# Patient Record
Sex: Female | Born: 1935 | Race: White | Hispanic: No | Smoking: Never smoker
Health system: Southern US, Community
[De-identification: ages and names within clinical notes are randomized; demographics above are authoritative.]

## PROBLEM LIST (undated history)

## (undated) DIAGNOSIS — I70219 Atherosclerosis of native arteries of extremities with intermittent claudication, unspecified extremity: Secondary | ICD-10-CM

## (undated) DIAGNOSIS — K559 Vascular disorder of intestine, unspecified: Secondary | ICD-10-CM

## (undated) DIAGNOSIS — I2510 Atherosclerotic heart disease of native coronary artery without angina pectoris: Secondary | ICD-10-CM

## (undated) DIAGNOSIS — I739 Peripheral vascular disease, unspecified: Secondary | ICD-10-CM

## (undated) DIAGNOSIS — K589 Irritable bowel syndrome without diarrhea: Secondary | ICD-10-CM

## (undated) DIAGNOSIS — D509 Iron deficiency anemia, unspecified: Secondary | ICD-10-CM

## (undated) DIAGNOSIS — E785 Hyperlipidemia, unspecified: Secondary | ICD-10-CM

## (undated) DIAGNOSIS — Q249 Congenital malformation of heart, unspecified: Secondary | ICD-10-CM

## (undated) DIAGNOSIS — K760 Fatty (change of) liver, not elsewhere classified: Secondary | ICD-10-CM

## (undated) DIAGNOSIS — K222 Esophageal obstruction: Secondary | ICD-10-CM

## (undated) DIAGNOSIS — C189 Malignant neoplasm of colon, unspecified: Secondary | ICD-10-CM

## (undated) DIAGNOSIS — K5190 Ulcerative colitis, unspecified, without complications: Secondary | ICD-10-CM

## (undated) DIAGNOSIS — M1990 Unspecified osteoarthritis, unspecified site: Secondary | ICD-10-CM

## (undated) DIAGNOSIS — G629 Polyneuropathy, unspecified: Secondary | ICD-10-CM

## (undated) DIAGNOSIS — R9439 Abnormal result of other cardiovascular function study: Secondary | ICD-10-CM

## (undated) DIAGNOSIS — K449 Diaphragmatic hernia without obstruction or gangrene: Secondary | ICD-10-CM

## (undated) DIAGNOSIS — I480 Paroxysmal atrial fibrillation: Secondary | ICD-10-CM

## (undated) DIAGNOSIS — I10 Essential (primary) hypertension: Secondary | ICD-10-CM

## (undated) DIAGNOSIS — K219 Gastro-esophageal reflux disease without esophagitis: Secondary | ICD-10-CM

## (undated) DIAGNOSIS — I4891 Unspecified atrial fibrillation: Secondary | ICD-10-CM

## (undated) HISTORY — DX: Irritable bowel syndrome, unspecified: K58.9

## (undated) HISTORY — DX: Iron deficiency anemia, unspecified: D50.9

## (undated) HISTORY — DX: Atherosclerotic heart disease of native coronary artery without angina pectoris: I25.10

## (undated) HISTORY — DX: Esophageal obstruction: K22.2

## (undated) HISTORY — DX: Congenital malformation of heart, unspecified: Q24.9

## (undated) HISTORY — DX: Unspecified osteoarthritis, unspecified site: M19.90

## (undated) HISTORY — DX: Malignant neoplasm of colon, unspecified: C18.9

## (undated) HISTORY — DX: Fatty (change of) liver, not elsewhere classified: K76.0

## (undated) HISTORY — DX: Polyneuropathy, unspecified: G62.9

## (undated) HISTORY — PX: APPENDECTOMY: SHX54

## (undated) HISTORY — DX: Ulcerative colitis, unspecified, without complications: K51.90

## (undated) HISTORY — DX: Hyperlipidemia, unspecified: E78.5

## (undated) HISTORY — DX: Abnormal result of other cardiovascular function study: R94.39

## (undated) HISTORY — PX: COLON SURGERY: SHX602

## (undated) HISTORY — DX: Diaphragmatic hernia without obstruction or gangrene: K44.9

## (undated) HISTORY — DX: Peripheral vascular disease, unspecified: I73.9

## (undated) HISTORY — PX: OTHER SURGICAL HISTORY: SHX169

## (undated) HISTORY — DX: Vascular disorder of intestine, unspecified: K55.9

## (undated) HISTORY — PX: TUBAL LIGATION: SHX77

## (undated) HISTORY — DX: Unspecified atrial fibrillation: I48.91

## (undated) HISTORY — DX: Essential (primary) hypertension: I10

---

## 1990-09-01 HISTORY — PX: DILATION AND CURETTAGE OF UTERUS: SHX78

## 2000-05-20 ENCOUNTER — Encounter: Admission: RE | Admit: 2000-05-20 | Discharge: 2000-05-20 | Payer: Self-pay | Admitting: Family Medicine

## 2000-05-20 ENCOUNTER — Encounter: Payer: Self-pay | Admitting: Family Medicine

## 2001-07-15 ENCOUNTER — Encounter: Admission: RE | Admit: 2001-07-15 | Discharge: 2001-07-15 | Payer: Self-pay | Admitting: Internal Medicine

## 2003-09-02 HISTORY — PX: OTHER SURGICAL HISTORY: SHX169

## 2003-09-28 ENCOUNTER — Other Ambulatory Visit: Admission: RE | Admit: 2003-09-28 | Discharge: 2003-09-28 | Payer: Self-pay | Admitting: Family Medicine

## 2003-10-04 ENCOUNTER — Encounter: Admission: RE | Admit: 2003-10-04 | Discharge: 2003-10-04 | Payer: Self-pay | Admitting: Family Medicine

## 2003-10-12 ENCOUNTER — Encounter: Admission: RE | Admit: 2003-10-12 | Discharge: 2003-10-12 | Payer: Self-pay | Admitting: Family Medicine

## 2003-10-25 ENCOUNTER — Encounter: Admission: RE | Admit: 2003-10-25 | Discharge: 2003-10-25 | Payer: Self-pay | Admitting: Family Medicine

## 2004-01-02 ENCOUNTER — Ambulatory Visit: Admission: RE | Admit: 2004-01-02 | Discharge: 2004-01-02 | Payer: Self-pay | Admitting: Obstetrics and Gynecology

## 2004-01-02 ENCOUNTER — Encounter (INDEPENDENT_AMBULATORY_CARE_PROVIDER_SITE_OTHER): Payer: Self-pay | Admitting: Specialist

## 2004-09-01 HISTORY — PX: ROTATOR CUFF REPAIR: SHX139

## 2004-12-26 ENCOUNTER — Ambulatory Visit: Payer: Self-pay | Admitting: Internal Medicine

## 2004-12-31 ENCOUNTER — Encounter: Admission: RE | Admit: 2004-12-31 | Discharge: 2004-12-31 | Payer: Self-pay | Admitting: Family Medicine

## 2005-01-10 ENCOUNTER — Ambulatory Visit: Payer: Self-pay | Admitting: Internal Medicine

## 2005-01-10 DIAGNOSIS — D126 Benign neoplasm of colon, unspecified: Secondary | ICD-10-CM | POA: Insufficient documentation

## 2005-01-13 ENCOUNTER — Encounter: Admission: RE | Admit: 2005-01-13 | Discharge: 2005-01-13 | Payer: Self-pay | Admitting: Family Medicine

## 2005-02-18 ENCOUNTER — Other Ambulatory Visit: Admission: RE | Admit: 2005-02-18 | Discharge: 2005-02-18 | Payer: Self-pay | Admitting: Obstetrics and Gynecology

## 2005-05-12 ENCOUNTER — Encounter: Admission: RE | Admit: 2005-05-12 | Discharge: 2005-05-12 | Payer: Self-pay | Admitting: Family Medicine

## 2005-07-10 ENCOUNTER — Encounter: Admission: RE | Admit: 2005-07-10 | Discharge: 2005-07-10 | Payer: Self-pay | Admitting: Orthopedic Surgery

## 2005-07-14 ENCOUNTER — Ambulatory Visit (HOSPITAL_COMMUNITY): Admission: RE | Admit: 2005-07-14 | Discharge: 2005-07-14 | Payer: Self-pay | Admitting: Orthopedic Surgery

## 2005-07-14 ENCOUNTER — Ambulatory Visit (HOSPITAL_BASED_OUTPATIENT_CLINIC_OR_DEPARTMENT_OTHER): Admission: RE | Admit: 2005-07-14 | Discharge: 2005-07-14 | Payer: Self-pay | Admitting: Orthopedic Surgery

## 2005-11-06 ENCOUNTER — Encounter: Admission: RE | Admit: 2005-11-06 | Discharge: 2005-11-06 | Payer: Self-pay | Admitting: Family Medicine

## 2006-05-27 ENCOUNTER — Encounter: Admission: RE | Admit: 2006-05-27 | Discharge: 2006-05-27 | Payer: Self-pay | Admitting: Family Medicine

## 2006-06-09 ENCOUNTER — Inpatient Hospital Stay (HOSPITAL_COMMUNITY): Admission: AD | Admit: 2006-06-09 | Discharge: 2006-06-12 | Payer: Self-pay | Admitting: Cardiovascular Disease

## 2006-09-01 HISTORY — PX: WRIST SURGERY: SHX841

## 2006-10-22 ENCOUNTER — Ambulatory Visit: Payer: Self-pay | Admitting: Internal Medicine

## 2006-11-05 ENCOUNTER — Encounter (INDEPENDENT_AMBULATORY_CARE_PROVIDER_SITE_OTHER): Payer: Self-pay | Admitting: Specialist

## 2006-11-05 ENCOUNTER — Ambulatory Visit: Payer: Self-pay | Admitting: Internal Medicine

## 2006-11-05 DIAGNOSIS — C20 Malignant neoplasm of rectum: Secondary | ICD-10-CM

## 2006-11-10 ENCOUNTER — Ambulatory Visit: Payer: Self-pay | Admitting: Internal Medicine

## 2006-11-10 ENCOUNTER — Ambulatory Visit: Payer: Self-pay | Admitting: Cardiology

## 2006-11-10 LAB — CONVERTED CEMR LAB: BUN: 13 mg/dL (ref 6–23)

## 2006-11-13 ENCOUNTER — Encounter: Admission: RE | Admit: 2006-11-13 | Discharge: 2006-11-13 | Payer: Self-pay | Admitting: Family Medicine

## 2006-11-26 ENCOUNTER — Ambulatory Visit (HOSPITAL_COMMUNITY): Admission: RE | Admit: 2006-11-26 | Discharge: 2006-11-26 | Payer: Self-pay | Admitting: Gastroenterology

## 2006-11-26 ENCOUNTER — Encounter: Payer: Self-pay | Admitting: Gastroenterology

## 2006-12-02 ENCOUNTER — Ambulatory Visit: Payer: Self-pay | Admitting: Gastroenterology

## 2006-12-31 HISTORY — PX: OTHER SURGICAL HISTORY: SHX169

## 2007-01-12 ENCOUNTER — Encounter (INDEPENDENT_AMBULATORY_CARE_PROVIDER_SITE_OTHER): Payer: Self-pay | Admitting: *Deleted

## 2007-01-12 ENCOUNTER — Encounter (INDEPENDENT_AMBULATORY_CARE_PROVIDER_SITE_OTHER): Payer: Self-pay | Admitting: Specialist

## 2007-01-12 ENCOUNTER — Inpatient Hospital Stay (HOSPITAL_COMMUNITY): Admission: RE | Admit: 2007-01-12 | Discharge: 2007-01-18 | Payer: Self-pay | Admitting: Surgery

## 2007-01-18 ENCOUNTER — Encounter (INDEPENDENT_AMBULATORY_CARE_PROVIDER_SITE_OTHER): Payer: Self-pay | Admitting: *Deleted

## 2007-01-27 ENCOUNTER — Ambulatory Visit: Payer: Self-pay | Admitting: Hematology & Oncology

## 2007-04-22 ENCOUNTER — Ambulatory Visit: Payer: Self-pay | Admitting: Hematology & Oncology

## 2007-04-23 LAB — CREATININE, SERUM: Creatinine, Ser: 0.97 mg/dL (ref 0.40–1.20)

## 2007-04-23 LAB — BUN: BUN: 18 mg/dL (ref 6–23)

## 2007-04-30 ENCOUNTER — Encounter (INDEPENDENT_AMBULATORY_CARE_PROVIDER_SITE_OTHER): Payer: Self-pay | Admitting: *Deleted

## 2007-04-30 ENCOUNTER — Ambulatory Visit (HOSPITAL_COMMUNITY): Admission: RE | Admit: 2007-04-30 | Discharge: 2007-04-30 | Payer: Self-pay | Admitting: Hematology & Oncology

## 2007-05-03 HISTORY — PX: OTHER SURGICAL HISTORY: SHX169

## 2007-05-13 ENCOUNTER — Encounter (INDEPENDENT_AMBULATORY_CARE_PROVIDER_SITE_OTHER): Payer: Self-pay | Admitting: *Deleted

## 2007-05-13 ENCOUNTER — Inpatient Hospital Stay (HOSPITAL_COMMUNITY): Admission: EM | Admit: 2007-05-13 | Discharge: 2007-05-20 | Payer: Self-pay | Admitting: Emergency Medicine

## 2007-05-17 ENCOUNTER — Encounter (INDEPENDENT_AMBULATORY_CARE_PROVIDER_SITE_OTHER): Payer: Self-pay | Admitting: *Deleted

## 2007-05-18 ENCOUNTER — Encounter: Payer: Self-pay | Admitting: Internal Medicine

## 2007-05-19 ENCOUNTER — Encounter: Payer: Self-pay | Admitting: Gastroenterology

## 2007-05-20 ENCOUNTER — Encounter (INDEPENDENT_AMBULATORY_CARE_PROVIDER_SITE_OTHER): Payer: Self-pay | Admitting: *Deleted

## 2007-05-26 ENCOUNTER — Ambulatory Visit: Payer: Self-pay | Admitting: Gastroenterology

## 2007-06-01 ENCOUNTER — Ambulatory Visit: Payer: Self-pay | Admitting: Internal Medicine

## 2007-06-01 LAB — CONVERTED CEMR LAB
Basophils Absolute: 0.1 10*3/uL (ref 0.0–0.1)
Eosinophils Absolute: 0.2 10*3/uL (ref 0.0–0.6)
HCT: 39.3 % (ref 36.0–46.0)
Hemoglobin: 13.1 g/dL (ref 12.0–15.0)
MCHC: 33.4 g/dL (ref 30.0–36.0)
MCV: 83 fL (ref 78.0–100.0)
Monocytes Absolute: 0.5 10*3/uL (ref 0.2–0.7)
Neutro Abs: 2.4 10*3/uL (ref 1.4–7.7)
Neutrophils Relative %: 49.5 % (ref 43.0–77.0)

## 2007-06-29 ENCOUNTER — Ambulatory Visit: Payer: Self-pay | Admitting: Internal Medicine

## 2007-06-29 ENCOUNTER — Encounter: Payer: Self-pay | Admitting: Internal Medicine

## 2007-06-29 DIAGNOSIS — K559 Vascular disorder of intestine, unspecified: Secondary | ICD-10-CM | POA: Insufficient documentation

## 2007-07-22 ENCOUNTER — Ambulatory Visit: Payer: Self-pay | Admitting: Hematology & Oncology

## 2007-09-17 ENCOUNTER — Ambulatory Visit: Payer: Self-pay | Admitting: Hematology & Oncology

## 2007-09-22 LAB — CBC WITH DIFFERENTIAL/PLATELET
BASO%: 0.5 % (ref 0.0–2.0)
LYMPH%: 34.4 % (ref 14.0–48.0)
MCHC: 34.3 g/dL (ref 32.0–36.0)
MONO#: 0.5 10*3/uL (ref 0.1–0.9)
NEUT#: 3.6 10*3/uL (ref 1.5–6.5)
RBC: 5.03 10*6/uL (ref 3.70–5.32)
RDW: 14.7 % — ABNORMAL HIGH (ref 11.3–14.5)
WBC: 6.8 10*3/uL (ref 3.9–10.0)
lymph#: 2.3 10*3/uL (ref 0.9–3.3)

## 2007-09-22 LAB — COMPREHENSIVE METABOLIC PANEL
ALT: 24 U/L (ref 0–35)
AST: 23 U/L (ref 0–37)
Calcium: 9.6 mg/dL (ref 8.4–10.5)
Chloride: 102 mEq/L (ref 96–112)
Creatinine, Ser: 0.77 mg/dL (ref 0.40–1.20)
Potassium: 3.3 mEq/L — ABNORMAL LOW (ref 3.5–5.3)
Sodium: 140 mEq/L (ref 135–145)
Total Protein: 7.1 g/dL (ref 6.0–8.3)

## 2007-09-22 LAB — CEA: CEA: <0.5 ng/mL (ref 0.0–5.0)

## 2007-09-23 ENCOUNTER — Encounter (INDEPENDENT_AMBULATORY_CARE_PROVIDER_SITE_OTHER): Payer: Self-pay | Admitting: *Deleted

## 2007-09-23 ENCOUNTER — Ambulatory Visit (HOSPITAL_COMMUNITY): Admission: RE | Admit: 2007-09-23 | Discharge: 2007-09-23 | Payer: Self-pay | Admitting: Hematology & Oncology

## 2007-11-08 DIAGNOSIS — I10 Essential (primary) hypertension: Secondary | ICD-10-CM | POA: Insufficient documentation

## 2007-11-08 DIAGNOSIS — I209 Angina pectoris, unspecified: Secondary | ICD-10-CM | POA: Insufficient documentation

## 2007-11-08 DIAGNOSIS — I080 Rheumatic disorders of both mitral and aortic valves: Secondary | ICD-10-CM

## 2007-11-08 DIAGNOSIS — Z862 Personal history of diseases of the blood and blood-forming organs and certain disorders involving the immune mechanism: Secondary | ICD-10-CM | POA: Insufficient documentation

## 2007-11-08 DIAGNOSIS — M810 Age-related osteoporosis without current pathological fracture: Secondary | ICD-10-CM

## 2007-11-08 DIAGNOSIS — Z8679 Personal history of other diseases of the circulatory system: Secondary | ICD-10-CM | POA: Insufficient documentation

## 2007-11-08 DIAGNOSIS — E785 Hyperlipidemia, unspecified: Secondary | ICD-10-CM

## 2007-11-08 DIAGNOSIS — M129 Arthropathy, unspecified: Secondary | ICD-10-CM | POA: Insufficient documentation

## 2007-12-15 ENCOUNTER — Encounter: Admission: RE | Admit: 2007-12-15 | Discharge: 2007-12-15 | Payer: Self-pay | Admitting: Family Medicine

## 2008-03-10 ENCOUNTER — Ambulatory Visit: Payer: Self-pay | Admitting: Hematology & Oncology

## 2008-03-20 ENCOUNTER — Ambulatory Visit: Payer: Self-pay | Admitting: Hematology & Oncology

## 2008-04-18 ENCOUNTER — Ambulatory Visit (HOSPITAL_COMMUNITY): Admission: RE | Admit: 2008-04-18 | Discharge: 2008-04-18 | Payer: Self-pay | Admitting: Hematology & Oncology

## 2008-04-18 ENCOUNTER — Encounter (INDEPENDENT_AMBULATORY_CARE_PROVIDER_SITE_OTHER): Payer: Self-pay | Admitting: *Deleted

## 2008-04-18 LAB — CBC WITH DIFFERENTIAL/PLATELET
Basophils Absolute: 0 10*3/uL (ref 0.0–0.1)
EOS%: 6.4 % (ref 0.0–7.0)
HCT: 42.5 % (ref 34.8–46.6)
HGB: 14.5 g/dL (ref 11.6–15.9)
LYMPH%: 40.6 % (ref 14.0–48.0)
MCH: 29.7 pg (ref 26.0–34.0)
MCHC: 34.1 g/dL (ref 32.0–36.0)
MONO#: 0.4 10*3/uL (ref 0.1–0.9)
NEUT%: 43.9 % (ref 39.6–76.8)
Platelets: 208 10*3/uL (ref 145–400)
lymph#: 1.8 10*3/uL (ref 0.9–3.3)

## 2008-04-18 LAB — CEA: CEA: <0.5 ng/mL (ref 0.0–5.0)

## 2008-04-18 LAB — COMPREHENSIVE METABOLIC PANEL
BUN: 14 mg/dL (ref 6–23)
CO2: 31 mEq/L (ref 19–32)
Calcium: 9.9 mg/dL (ref 8.4–10.5)
Chloride: 104 mEq/L (ref 96–112)
Creatinine, Ser: 0.82 mg/dL (ref 0.40–1.20)
Total Bilirubin: 1 mg/dL (ref 0.3–1.2)

## 2008-04-26 ENCOUNTER — Encounter: Payer: Self-pay | Admitting: Internal Medicine

## 2008-09-28 ENCOUNTER — Ambulatory Visit: Payer: Self-pay | Admitting: Hematology & Oncology

## 2008-10-09 ENCOUNTER — Ambulatory Visit (HOSPITAL_COMMUNITY): Admission: RE | Admit: 2008-10-09 | Discharge: 2008-10-09 | Payer: Self-pay | Admitting: Hematology & Oncology

## 2008-10-09 ENCOUNTER — Encounter (INDEPENDENT_AMBULATORY_CARE_PROVIDER_SITE_OTHER): Payer: Self-pay | Admitting: *Deleted

## 2008-10-09 LAB — CBC WITH DIFFERENTIAL/PLATELET
BASO%: 0.4 % (ref 0.0–2.0)
EOS%: 2.4 % (ref 0.0–7.0)
HCT: 43 % (ref 34.8–46.6)
LYMPH%: 35 % (ref 14.0–48.0)
MCH: 30.5 pg (ref 26.0–34.0)
MCHC: 34.7 g/dL (ref 32.0–36.0)
MCV: 88 fL (ref 81.0–101.0)
MONO%: 9.2 % (ref 0.0–13.0)
NEUT%: 53 % (ref 39.6–76.8)
Platelets: 221 10*3/uL (ref 145–400)
RBC: 4.89 10*6/uL (ref 3.70–5.32)
WBC: 5.5 10*3/uL (ref 3.9–10.0)

## 2008-10-09 LAB — COMPREHENSIVE METABOLIC PANEL
ALT: 23 U/L (ref 0–35)
Alkaline Phosphatase: 59 U/L (ref 39–117)
CO2: 26 mEq/L (ref 19–32)
Creatinine, Ser: 0.89 mg/dL (ref 0.40–1.20)
Sodium: 142 mEq/L (ref 135–145)
Total Bilirubin: 1 mg/dL (ref 0.3–1.2)
Total Protein: 7.1 g/dL (ref 6.0–8.3)

## 2008-10-09 LAB — CEA: CEA: <0.5 ng/mL (ref 0.0–5.0)

## 2008-10-10 ENCOUNTER — Ambulatory Visit: Payer: Self-pay | Admitting: Hematology & Oncology

## 2008-10-11 ENCOUNTER — Encounter: Payer: Self-pay | Admitting: Internal Medicine

## 2008-11-14 ENCOUNTER — Ambulatory Visit: Payer: Self-pay | Admitting: Internal Medicine

## 2008-12-11 ENCOUNTER — Telehealth: Payer: Self-pay | Admitting: Internal Medicine

## 2008-12-14 ENCOUNTER — Encounter: Payer: Self-pay | Admitting: Internal Medicine

## 2008-12-14 ENCOUNTER — Ambulatory Visit: Payer: Self-pay | Admitting: Internal Medicine

## 2008-12-15 ENCOUNTER — Telehealth (INDEPENDENT_AMBULATORY_CARE_PROVIDER_SITE_OTHER): Payer: Self-pay | Admitting: *Deleted

## 2008-12-16 ENCOUNTER — Encounter: Payer: Self-pay | Admitting: Internal Medicine

## 2008-12-18 ENCOUNTER — Telehealth: Payer: Self-pay | Admitting: Internal Medicine

## 2008-12-19 ENCOUNTER — Encounter: Payer: Self-pay | Admitting: Internal Medicine

## 2008-12-26 ENCOUNTER — Telehealth: Payer: Self-pay | Admitting: Internal Medicine

## 2009-01-09 ENCOUNTER — Ambulatory Visit: Payer: Self-pay | Admitting: Internal Medicine

## 2009-01-09 DIAGNOSIS — K5010 Crohn's disease of large intestine without complications: Secondary | ICD-10-CM | POA: Insufficient documentation

## 2009-01-11 ENCOUNTER — Encounter: Admission: RE | Admit: 2009-01-11 | Discharge: 2009-01-11 | Payer: Self-pay | Admitting: Obstetrics and Gynecology

## 2009-01-16 ENCOUNTER — Encounter: Admission: RE | Admit: 2009-01-16 | Discharge: 2009-01-16 | Payer: Self-pay | Admitting: Obstetrics and Gynecology

## 2009-02-19 ENCOUNTER — Telehealth: Payer: Self-pay | Admitting: Internal Medicine

## 2009-03-20 ENCOUNTER — Telehealth: Payer: Self-pay | Admitting: Internal Medicine

## 2009-04-16 ENCOUNTER — Ambulatory Visit: Payer: Self-pay | Admitting: Internal Medicine

## 2009-04-16 DIAGNOSIS — Z85048 Personal history of other malignant neoplasm of rectum, rectosigmoid junction, and anus: Secondary | ICD-10-CM

## 2009-09-25 ENCOUNTER — Ambulatory Visit: Payer: Self-pay | Admitting: Hematology & Oncology

## 2009-09-26 ENCOUNTER — Encounter (INDEPENDENT_AMBULATORY_CARE_PROVIDER_SITE_OTHER): Payer: Self-pay | Admitting: *Deleted

## 2009-09-26 ENCOUNTER — Ambulatory Visit (HOSPITAL_BASED_OUTPATIENT_CLINIC_OR_DEPARTMENT_OTHER): Admission: RE | Admit: 2009-09-26 | Discharge: 2009-09-26 | Payer: Self-pay | Admitting: Hematology & Oncology

## 2009-09-26 ENCOUNTER — Ambulatory Visit: Payer: Self-pay | Admitting: Diagnostic Radiology

## 2009-09-26 LAB — CMP (CANCER CENTER ONLY)
Albumin: 4.1 g/dL (ref 3.3–5.5)
Alkaline Phosphatase: 66 U/L (ref 26–84)
BUN, Bld: 18 mg/dL (ref 7–22)
CO2: 30 mEq/L (ref 18–33)
Glucose, Bld: 103 mg/dL (ref 73–118)
Potassium: 3.9 mEq/L (ref 3.3–4.7)
Total Bilirubin: 0.9 mg/dl (ref 0.20–1.60)

## 2009-09-26 LAB — CBC WITH DIFFERENTIAL (CANCER CENTER ONLY)
BASO#: 0 10*3/uL (ref 0.0–0.2)
Eosinophils Absolute: 0.2 10*3/uL (ref 0.0–0.5)
HGB: 15.1 g/dL (ref 11.6–15.9)
LYMPH%: 39.3 % (ref 14.0–48.0)
MCH: 30.5 pg (ref 26.0–34.0)
MCV: 90 fL (ref 81–101)
MONO%: 7.2 % (ref 0.0–13.0)
RBC: 4.96 10*6/uL (ref 3.70–5.32)

## 2009-09-26 LAB — CEA: CEA: <0.5 ng/mL (ref 0.0–5.0)

## 2009-10-03 ENCOUNTER — Encounter: Payer: Self-pay | Admitting: Internal Medicine

## 2009-12-30 LAB — HM MAMMOGRAPHY: HM Mammogram: NORMAL

## 2010-01-18 ENCOUNTER — Encounter: Admission: RE | Admit: 2010-01-18 | Discharge: 2010-01-18 | Payer: Self-pay | Admitting: Obstetrics and Gynecology

## 2010-04-02 ENCOUNTER — Ambulatory Visit: Payer: Self-pay | Admitting: Hematology & Oncology

## 2010-04-03 ENCOUNTER — Encounter: Payer: Self-pay | Admitting: Internal Medicine

## 2010-04-03 LAB — CBC WITH DIFFERENTIAL (CANCER CENTER ONLY)
BASO%: 1.5 % (ref 0.0–2.0)
EOS%: 3.9 % (ref 0.0–7.0)
HCT: 48.4 % — ABNORMAL HIGH (ref 34.8–46.6)
LYMPH#: 2.1 10*3/uL (ref 0.9–3.3)
MONO#: 0.4 10*3/uL (ref 0.1–0.9)
NEUT#: 3.3 10*3/uL (ref 1.5–6.5)
NEUT%: 53.6 % (ref 39.6–80.0)
RDW: 11.6 % (ref 10.5–14.6)
WBC: 6.1 10*3/uL (ref 3.9–10.0)

## 2010-04-03 LAB — COMPREHENSIVE METABOLIC PANEL
ALT: 19 U/L (ref 0–35)
AST: 20 U/L (ref 0–37)
Albumin: 4.9 g/dL (ref 3.5–5.2)
Alkaline Phosphatase: 62 U/L (ref 39–117)
Calcium: 10.5 mg/dL (ref 8.4–10.5)
Chloride: 104 mEq/L (ref 96–112)
Potassium: 4.1 mEq/L (ref 3.5–5.3)
Sodium: 143 mEq/L (ref 135–145)
Total Protein: 7.6 g/dL (ref 6.0–8.3)

## 2010-04-03 LAB — CEA: CEA: <0.5 ng/mL (ref 0.0–5.0)

## 2010-06-21 ENCOUNTER — Telehealth: Payer: Self-pay | Admitting: Internal Medicine

## 2010-07-08 ENCOUNTER — Ambulatory Visit: Payer: Self-pay | Admitting: Internal Medicine

## 2010-09-01 LAB — HM PAP SMEAR: HM Pap smear: NORMAL

## 2010-09-21 ENCOUNTER — Encounter: Payer: Self-pay | Admitting: Family Medicine

## 2010-09-22 ENCOUNTER — Encounter: Payer: Self-pay | Admitting: Hematology & Oncology

## 2010-09-23 ENCOUNTER — Encounter: Payer: Self-pay | Admitting: Hematology & Oncology

## 2010-09-30 ENCOUNTER — Ambulatory Visit (HOSPITAL_BASED_OUTPATIENT_CLINIC_OR_DEPARTMENT_OTHER): Payer: Medicare Other | Admitting: Hematology & Oncology

## 2010-10-03 NOTE — Assessment & Plan Note (Signed)
Summary: f/u crohns disease and hx of rectal ca/ds   History of Present Illness Visit Type: Follow-up Visit Primary GI MD: Lina Sar MD Primary Provider: n/a Requesting Provider: n/a Chief Complaint: Patient not having any problems recently History of Present Illness:   This is a 77 year old white female with a history of a sigmoid carcinoma resected in May 2008 followed by ischemic colitis and an anastomotic stricture requiring dilatation. She had a hyperplastic polyp. In 2009, a flexible sigmoidoscopy revealed ulcerations in the left colon consistent with Crohn's disease. Her IBD markerswere positive. Her last colonoscopy in April 2010 showed colitis, aphthoid ulcers and neutrophilic infiltrate in the left colon. A recall colonoscopy is due in April 2013. Her last office visit with [place] was in August 2010. She has no complaints today.   GI Review of Systems      Denies abdominal pain, acid reflux, belching, bloating, chest pain, dysphagia with liquids, dysphagia with solids, heartburn, loss of appetite, nausea, vomiting, vomiting blood, weight loss, and  weight gain.        Denies anal fissure, black tarry stools, change in bowel habit, constipation, diarrhea, diverticulosis, fecal incontinence, heme positive stool, hemorrhoids, irritable bowel syndrome, jaundice, light color stool, liver problems, rectal bleeding, and  rectal pain.    Current Medications (verified): 1)  Tekturna Hct 300-25 Mg Tabs (Aliskiren-Hydrochlorothiazide) .... One Tablet By Mouth Once Daily 2)  Toprol Xl 50 Mg Xr24h-Tab (Metoprolol Succinate) .... 1/2 Tablet By Mouth Every Morning and  One Tablet By Mouth At Bedtime 3)  Metoprolol Tartrate 25 Mg Tabs (Metoprolol Tartrate) .... One Tablet By Mouth As Needed 4)  Fiorinal 50-325-40 Mg Caps (Butalbital-Aspirin-Caffeine) .... One Tablet By Mouth As Needed For Headaches 5)  Calcium 1200 1200-1000 Mg-Unit Chew (Calcium Carbonate-Vit D-Min) .... One Tablet By Mouth Once  Daily 6)  Multivitamins  Tabs (Multiple Vitamin) .... Once Daily  Allergies (verified): 1)  ! Cardizem 2)  ! Diovan 3)  ! * Avalide 4)  ! * Verpamil 5)  ! Coumadin 6)  ! Norvasc 7)  ! * Lisinopril 8)  ! Vicodin 9)  ! Darvocet 10)  ! * Tylenol # 3  Past History:  Past Medical History: Reviewed history from 11/08/2007 and no changes required. 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 Sigmoid resection for invasive rectal adenocarcinoma (12/2006) Abdominoperineal resection anastomotic stricture (05/2007) Tubaligation (1980) Appendectomy (8657) D & C (1992) Rotator Cuff Repair (2006) Rt. Salpingo oophorectomy & cyst removal (2005) Mandibular Reconstruction  Family History: Reviewed history from 11/09/2008 and no changes required. Family History of Colon Cancer: Father Family History of Heart Disease: Father  Social History: Reviewed history from 11/14/2008 and no changes required. Widowed Alcohol Use - no Illicit Drug Use - no Patient has never smoked.   Review of Systems  The patient denies allergy/sinus, anemia, anxiety-new, arthritis/joint pain, back pain, blood in urine, breast changes/lumps, change in vision, confusion, cough, coughing up blood, depression-new, fainting, fatigue, fever, headaches-new, hearing problems, heart murmur, heart rhythm changes, itching, menstrual pain, muscle pains/cramps, night sweats, nosebleeds, pregnancy symptoms, shortness of breath, skin rash, sleeping problems, sore throat, swelling of feet/legs,  swollen lymph glands, thirst - excessive , urination - excessive , urination changes/pain, urine leakage, vision  changes, and voice change.         Pertinent positive and negative review of systems were noted in the above HPI. All other ROS was otherwise negative.   Vital Signs:  Patient profile:   77 year old female Height:      66 inches Weight:      191.13 pounds BMI:     30.96 Pulse rate:   80 / minute Pulse rhythm:   regular BP sitting:   122 / 74  (left arm) Cuff size:   regular  Vitals Entered By: June McMurray CMA Duncan Dull) (July 08, 2010 1:34 PM)  Physical Exam  General:  Well developed, well nourished, no acute distress. Neck:  Supple; no masses or thyromegaly. Lungs:  Clear throughout to auscultation. Heart:  Regular rate and rhythm; no murmurs, rubs,  or bruits. Abdomen:  well-healed surgical scar in lower abdomen and a post appendectomy scar. No tenderness. No mass. Normal active bowel sounds. Rectal:  normal rectal sphincter tone, stool is Hemoccult negative. Extremities:  No clubbing, cyanosis, edema or deformities noted. Skin:  Intact without significant lesions or rashes. Psych:  Alert and cooperative. Normal mood and affect.   Impression & Recommendations:  Problem # 1:  PERS HX MAL NEOPLSM RECT RECTOSIGMOID JUNC&ANUS (ICD-V10.06) Patient is asymptomatic. A recall colonoscopy will be due in April 2013.  Problem # 2:  Family Hx of COLON CANCER (ICD-153.9) A recall colonoscopy will be due in April 2013.  Problem # 3:  CROHN'S DISEASE, LARGE INTESTINE (ICD-555.1) Patient has Crohn's disease of the left colon but is currently asymptomatic. She has positive IBD markers but is asymptomatic on no medications. Patient discontinued all medications last year and remains asymptomatic. I would question the diagnosis of Crohn's disease at this time.  Patient Instructions: 1)  Remain off medications for now. 2)  Call if any symptoms arise. 3)  Recall colonoscopy for  followup of colon cancer in April 2013 4)  The medication list was reviewed and reconciled.  All changed / newly prescribed medications were explained.  A complete medication list was provided to the patient / caregiver.

## 2010-10-03 NOTE — Letter (Signed)
[facility]   Imported By: Sherian Rein 04/30/2010 08:49:21  _____________________________________________________________________  External Attachment:    Type:   Image     Comment:   External Document

## 2010-10-03 NOTE — Letter (Signed)
[facility]   Imported By: Lester [place] 10/24/2009 10:39:30  _____________________________________________________________________  External Attachment:    Type:   Image     Comment:   External Document

## 2010-10-03 NOTE — Progress Notes (Signed)
Summary: call back request  Phone Note Call from Patient Call back at Home Phone [phone]   Caller: Patient Call For: Dr. Juanda Chance Reason for Call: Talk to Nurse Summary of Call: pt came into office today for appt, but has been bumped... no record of pt being contacted regarding this... I apologized to pt and she was very nice and understanding... pt said that she is actually feeling very well right now and has been... pt didnt feel it was necessary to resch and said she would be in touch if anything problems arose... otherwise, pt said she would see [place] for her COL next year... told pt that since she was advised by another physician to have this appt, either Dr. Juanda Chance or her nurse would follow up with her and give her a call to fully determine whether she needs to be seen before her COL... pt said ok... (appt with Gunnar Fusi at [facility] today was offered to pt) Initial call taken by: Vallarie Mare,  June 21, 2010 8:26 AM  Follow-up for Phone Call        Called patient and apologized that she was bumped from the schedule without being notified first. She was very understanding. I have rescheduled her to see Dr Juanda Chance for 07/08/10 for routine follow up. Although she is having no problems currently, I told her that it is important that we continue to follow up with her on a routine basis in order to keep her healthy. Patient understands. Follow-up by: Lamona Curl CMA Duncan Dull),  June 21, 2010 12:14 PM

## 2010-10-04 ENCOUNTER — Encounter: Payer: Self-pay | Admitting: Internal Medicine

## 2010-10-04 ENCOUNTER — Encounter (HOSPITAL_BASED_OUTPATIENT_CLINIC_OR_DEPARTMENT_OTHER): Payer: Medicare Other | Admitting: Hematology & Oncology

## 2010-10-04 DIAGNOSIS — C20 Malignant neoplasm of rectum: Secondary | ICD-10-CM

## 2010-10-04 LAB — CEA: CEA: <0.5 ng/mL (ref 0.0–5.0)

## 2010-11-07 NOTE — Letter (Signed)
Summary: [facility]  [facility]   Imported By: Lester [place] 10/31/2010 10:37:18  _____________________________________________________________________  External Attachment:    Type:   Image     Comment:   External Document

## 2010-11-28 ENCOUNTER — Encounter: Payer: Self-pay | Admitting: Internal Medicine

## 2010-11-28 ENCOUNTER — Ambulatory Visit (INDEPENDENT_AMBULATORY_CARE_PROVIDER_SITE_OTHER)
Admission: RE | Admit: 2010-11-28 | Discharge: 2010-11-28 | Disposition: A | Discharge status: Home / Self Care | Payer: Medicare Other | Source: Ambulatory Visit | Attending: Internal Medicine | Admitting: Internal Medicine

## 2010-11-28 ENCOUNTER — Ambulatory Visit (INDEPENDENT_AMBULATORY_CARE_PROVIDER_SITE_OTHER): Payer: Medicare Other | Admitting: Internal Medicine

## 2010-11-28 VITALS — BP 118/72 | HR 64 | Temp 98.6°F | Ht 66.0 in | Wt 197.0 lb

## 2010-11-28 DIAGNOSIS — I4891 Unspecified atrial fibrillation: Secondary | ICD-10-CM

## 2010-11-28 DIAGNOSIS — I10 Essential (primary) hypertension: Secondary | ICD-10-CM | POA: Insufficient documentation

## 2010-11-28 DIAGNOSIS — R05 Cough: Secondary | ICD-10-CM

## 2010-11-28 DIAGNOSIS — J209 Acute bronchitis, unspecified: Secondary | ICD-10-CM

## 2010-11-28 DIAGNOSIS — H6090 Unspecified otitis externa, unspecified ear: Secondary | ICD-10-CM

## 2010-11-28 DIAGNOSIS — H60399 Other infective otitis externa, unspecified ear: Secondary | ICD-10-CM

## 2010-11-28 DIAGNOSIS — R059 Cough, unspecified: Secondary | ICD-10-CM

## 2010-11-28 DIAGNOSIS — Z8679 Personal history of other diseases of the circulatory system: Secondary | ICD-10-CM

## 2010-11-28 MED ORDER — NEOMYCIN-POLYMYXIN-HC 3.5-10000-1 OT SOLN: 3.0000 [drp] | Freq: Three times a day (TID) | OTIC | Status: AC

## 2010-11-28 MED ORDER — PROMETHAZINE-DM 6.25-15 MG/5ML PO SYRP: 5.0000 mL | ORAL_SOLUTION | Freq: Four times a day (QID) | ORAL | Status: AC | PRN

## 2010-11-28 MED ORDER — CEFUROXIME AXETIL 500 MG PO TABS: 500.0000 mg | ORAL_TABLET | Freq: Two times a day (BID) | ORAL | Status: AC

## 2010-11-28 NOTE — Progress Notes (Signed)
Subjective:    Patient ID: [name], female    DOB: 13-Nov-1932, 77 y.o.   MRN: [id number]  Cough This is a new problem. The current episode started 1 to 4 weeks ago. The problem has been waxing and waning. The problem occurs every few hours. The cough is productive of purulent sputum. Associated symptoms include ear congestion and ear pain. Pertinent negatives include no chest pain, chills, fever, headaches, heartburn, hemoptysis, myalgias, nasal congestion, postnasal drip, rash, rhinorrhea, sore throat, shortness of breath, sweats, weight loss or wheezing. The symptoms are aggravated by nothing. She has tried OTC cough suppressant for the symptoms. The treatment provided mild relief. There is no history of asthma, bronchiectasis, bronchitis, emphysema, environmental allergies or pneumonia.      Review of Systems  Constitutional: Negative for fever, chills, weight loss, diaphoresis, activity change, appetite change, fatigue and unexpected weight change.  HENT: Positive for ear pain. Negative for hearing loss, nosebleeds, congestion, sore throat, facial swelling, rhinorrhea, sneezing, trouble swallowing, neck pain, voice change, postnasal drip, sinus pressure, tinnitus and ear discharge.   Eyes: Negative for pain.  Respiratory: Positive for cough. Negative for hemoptysis, choking, shortness of breath, wheezing and stridor.   Cardiovascular: Negative for chest pain, palpitations and leg swelling.  Gastrointestinal: Negative for heartburn, abdominal pain, diarrhea and constipation.  Genitourinary: Negative for dysuria, urgency, hematuria and flank pain.  Musculoskeletal: Negative for myalgias, back pain, joint swelling, arthralgias and gait problem.  Skin: Negative for color change, pallor and rash.  Neurological: Negative for dizziness, tremors, weakness, numbness and headaches.  Hematological: Negative for environmental allergies. Does not bruise/bleed easily.  Psychiatric/Behavioral:  Negative for behavioral problems, confusion, dysphoric mood, decreased concentration and agitation.       Objective:   Physical Exam  Nursing note and vitals reviewed. Constitutional: She is oriented to person, place, and time. She appears well-developed and well-nourished. No distress.  HENT:  Head: Normocephalic and atraumatic.  Right Ear: Hearing, tympanic membrane, external ear and ear canal normal.  Left Ear: Hearing, tympanic membrane and external ear normal. Left ear swelling: left eac has mild scaling but no erythema, swelling or exudate.  Nose: Nose normal.  Mouth/Throat: Oropharynx is clear and moist. No oropharyngeal exudate.  Eyes: Conjunctivae and EOM are normal. Pupils are equal, round, and reactive to light. Right eye exhibits no discharge. Left eye exhibits no discharge. No scleral icterus.  Neck: Normal range of motion. Neck supple. No thyromegaly present.  Cardiovascular: Normal rate, regular rhythm, S1 normal, S2 normal, normal heart sounds and intact distal pulses.  PMI is not displaced.  Exam reveals no gallop and no friction rub.   No murmur heard. Pulmonary/Chest: Effort normal and breath sounds normal. No respiratory distress. She has no wheezes. She has no rales. She exhibits no tenderness.  Abdominal: Soft. Bowel sounds are normal. She exhibits no distension and no mass. There is no tenderness. There is no rebound and no guarding.  Musculoskeletal: Normal range of motion. She exhibits no edema and no tenderness.  Lymphadenopathy:       Head (right side): No submental, no submandibular, no tonsillar, no preauricular, no posterior auricular and no occipital adenopathy present.       Head (left side): No submental, no submandibular, no tonsillar, no preauricular, no posterior auricular and no occipital adenopathy present.       Right cervical: No superficial cervical adenopathy present.      Left cervical: No superficial cervical adenopathy present.    She has no  axillary adenopathy.  Neurological: She is alert and oriented to person, place, and time. She has normal reflexes. No cranial nerve deficit. Coordination normal.  Skin: Skin is warm and dry. No rash noted. She is not diaphoretic. No erythema. No pallor.  Psychiatric: She has a normal mood and affect. Her behavior is normal. Judgment and thought content normal.          Assessment & Plan:

## 2010-11-28 NOTE — Assessment & Plan Note (Signed)
Start cortisporin otic susp 

## 2010-11-28 NOTE — Assessment & Plan Note (Signed)
Will check a cxr for pna, masses, edema

## 2010-11-28 NOTE — Patient Instructions (Signed)
Bronchitis Bronchitis is the body's way of reacting to injury and/or infection (inflammation) of the bronchi. Bronchi are the air tubes that extend from the windpipe into the lungs. If the inflammation becomes severe, it may cause shortness of breath.  CAUSES Inflammation may be caused by:  A virus.   Germs (bacteria).   Dust.   Allergens.   Pollutants and many other irritants.  The cells lining the bronchial tree are covered with tiny hairs (cilia). These constantly beat upward, away from the lungs, toward the mouth. This keeps the lungs free of pollutants. When these cells become too irritated and are unable to do their job, mucus begins to develop. This causes the characteristic cough of bronchitis. The cough clears the lungs when the cilia are unable to do their job. Without either of these protective mechanisms, the mucus would settle in the lungs. Then you would develop pneumonia. Smoking is a common cause of bronchitis and can contribute to pneumonia. Stopping this habit is the single most important thing you can do to help yourself. TREATMENT  Your caregiver may prescribe an antibiotic if the cough is caused by bacteria. Also, medicines that open up your airways make it easier to breathe. Your caregiver may also recommend or prescribe an expectorant. It will loosen the mucus to be coughed up. Only take over-the-counter or prescription medicines for pain, discomfort, or fever as directed by your caregiver.   Removing whatever causes the problem (smoking, for example) is critical to preventing the problem from getting worse.   Cough suppressants may be prescribed for relief of cough symptoms.   Inhaled medicines may be prescribed to help with symptoms now and to help prevent problems from returning.   For those with recurrent (chronic) bronchitis, there may be a need for steroid medicines.  SEEK IMMEDIATE MEDICAL CARE IF:  During treatment, you develop more pus-like mucus  (purulent sputum).   You or your child has an oral temperature above 100.5, not controlled by medicine.   Your baby is older than 3 months with a rectal temperature of 102 F (38.9 C) or higher.   Your baby is 3 months old or younger with a rectal temperature of 100.4 F (38 C) or higher.   You become progressively more ill.   You have increased difficulty breathing, wheezing, or shortness of breath.  It is necessary to seek immediate medical care if you are elderly or sick from any other disease. MAKE SURE YOU:  Understand these instructions.   Will watch your condition.   Will get help right away if you are not doing well or get worse.  Document Released: 08/18/2005 Document Re-Released: 11/12/2009 [employer] Patient Information 2011 [employer]. 

## 2010-11-28 NOTE — Assessment & Plan Note (Signed)
By all accounts she has good rate and rhythm control but she told me that she is not willing to take coumadin or pradaxa despite the high risk for CVA with her hx. Of parox AF

## 2010-11-28 NOTE — Assessment & Plan Note (Signed)
Start ceftin to treat the infection and control the cough with phenergan dm

## 2010-11-28 NOTE — Assessment & Plan Note (Signed)
Her BP is well controlled 

## 2011-01-14 NOTE — H&P (Signed)
[name], [name] NO.:  [id number]   MEDICAL RECORD NO.:  [id number]          PATIENT TYPE:  INP   LOCATION:  0104                         FACILITY:  [facility]   PHYSICIAN:  Elliot Cousin, M.D.    DATE OF BIRTH:  1933/05/13   DATE OF ADMISSION:  05/12/2007  DATE OF DISCHARGE:                              HISTORY & PHYSICAL   PRIMARY CARE PHYSICIAN:  Talmadge Coventry, M.D.   CARDIOLOGIST:  Nicki Guadalajara, M.D.   GASTROENTEROLOGIST:  Hedwig Morton. Juanda Chance, M.D.   GENERAL SURGEON:  Clovis Pu. Cornett, M.D.   CHIEF COMPLAINT:  Abdominal pain and nausea.   HISTORY OF PRESENT ILLNESS:  The patient is a 77 year old woman with a  past medical history significant for rectal cancer, status post low  anterior colon resection in May 2008, hypertension, and hyperlipidemia.  She presents to the emergency department with a chief complaint of  abdominal pain and nausea.  Her pain started 2 days ago.  She had been  having small frequent loose stools for several weeks to several months  following the colon resection.  Three days ago, she took Imodium prior  to going to church.  Since that time, she has felt tremendously  constipated.  She had a mucusy discharge from her rectum yesterday, with  a small pebble of a bowel movement last night.  Prior to the mucusy  rectal stool, she took a Dulcolax laxative which did not seem to help.  The abdominal pain started yesterday afternoon.  It is located over the  lower abdomen.  She describes the pain as a cramping pain, and it has  been moderate to severe in intensity.  She denies associated black tarry  stools, bright red blood per rectum, and painful urination. She has had  nausea but no vomiting up until she drank the contrast in the emergency  department last night.  She had an episode of vomiting with a large  amount of emesis.  She denies bright red blood in the emesis and coffee-  ground emesis.  She had one episode of subjective  fever and chills.   During the evaluation in the emergency department, the patient is noted  to be afebrile and hemodynamically stable.  A CT scan of the abdomen and  pelvis was ordered by the emergency department physician.  The results  of the CT reveal a large amount of stool, colitis/pericolitis at the  left upper quadrant; no abscess, and no perforation.  Her lab data are  significant for a white blood cell count of 14.7, and a serum potassium  of 3.3.  The patient will be admitted for further evaluation and  management.   PAST MEDICAL HISTORY:  1. Rectal adenocarcinoma, status post laparoscopic-assisted low      anterior resection, May 2008, by Dr. Luisa Hart.  The patient did not      undergo chemotherapy or radiation therapy.  2. Hypertension.  3. Hyperlipidemia.  4. History of paroxysmal atrial fibrillation.  5. Status post appendectomy in the past.  6. Status post D&C in the past.  7. Status post right salpingo-oophorectomy  in May 2005.  8. Status post shoulder arthroscopic surgery in July 2006.   MEDICATIONS:  1. Hydrochlorothiazide 25 mg daily.  2. Metoprolol 25 mg in the morning, and 50 mg at bedtime.  3. Tekturna 150 mg daily.  4. Fiorinal as needed.   ALLERGIES:  The patient has allergies and/or intolerances to AVALIDE,  CARDIZEM, COUMADIN, DIOVAN, LISINOPRIL, NORVASC, and VERAPAMIL.   SOCIAL HISTORY:  The patient is widowed.  She lives in [place],  [place].  She has four children.  She is retired.  She denies  tobacco, alcohol, and illicit drug use.   FAMILY HISTORY:  Her mother died of Alzheimer's dementia at 69 years of  age.  Her father died of complications of heart surgery at 75 years of  age.   REVIEW OF SYSTEMS:  The patient's review of systems is positive for  small frequent loose bowel movements which started following the rectal  surgery.  Review of systems is negative for black, tarry stools and  bright red blood per rectum.    PHYSICAL EXAMINATION:  VITAL SIGNS:  Temperature 97.1, blood pressure  137/83, pulse 89, respiratory rate 18, oxygen saturation 97% on room  air.  GENERAL:  The patient is a 77 year old Caucasian woman who is currently  lying in bed, in no acute distress.  She does appear ill.  HEENT:  Head is normocephalic, atraumatic.  Pupils are equal, round, and  reactive to light.  Extraocular movements are intact.  Conjunctivae are  clear.  Sclerae are white.  Nasal mucosa is dry.  No sinus tenderness.  Oropharynx reveals dry mucous membranes.  Teeth are in good repair.  No  posterior exudates or erythema.  NECK:  Supple.  No adenopathy, no thyromegaly, no bruit, no JVD.  LUNGS:  Clear to auscultation bilaterally.  HEART:  S1, S2, with an ectopic beat, and soft systolic murmur.  ABDOMEN:  Obese.  Hypoactive bowel sounds.  Soft.  Mildly tender in the  hypogastrium, without rebound, guarding, or distention.  RECTAL:  Rectal tone is excellent.  Small amount of brown stool in the  rectal vault.  Hemoccult testing pending.  EXTREMITIES:  Pedal pulses palpable bilaterally.  No pretibial edema,  and no pedal edema.  NEUROLOGIC:  The patient is alert and oriented x3.  Cranial nerves II-  XII are intact.  Strength is 5/5 throughout.  Sensation is intact.   ADMISSION LABORATORIES:  EKG results are pending.  WBC 14.7, hemoglobin  15, platelets 274.  Sodium 143, potassium 3.3, chloride 102, CO2 27,  glucose 172, BUN 20, creatinine 1.02, total bilirubin 1.1, alkaline  phosphatase 69, SGOT 31, SGPT 26, total protein 7.6, albumin 4.3,  calcium 10.6.   ASSESSMENT:  1. Abdominal pain with nausea (and vomiting following ingestion of the      oral contrast in the emergency department).  More than likely, the      patient's symptomatology is secondary to constipation and colitis      as seen on the CT scan of the abdomen.  2. Leukocytosis.  The leukocytosis is probably secondary to the      colitis.  The patient  is afebrile, however.  3. Hypokalemia.  The patient's serum potassium is 3.3.  The      hypokalemia may be secondary to hydrochlorothiazide therapy.  4. Mild hyperglycemia.  The patient's venous glucose is 172.  However,      it was not taken fasting.  5. History of rectal adenocarcinoma, status post  low anterior      resection in May 2008.  6. Hypertension.  The patient's blood pressure is stable currently.      She is treated with three antihypertensive medications.   PLAN:  1. The patient will be admitted for further evaluation and management.  2. We will start empiric antibiotic treatment with Flagyl and Cipro      intravenously.  3. We will start a clear liquid diet initially and advance as      tolerated.  4. Laxative therapy with milk of magnesia and MiraLax.  We will try to      evacuate the stool with Dulcolax suppositories and/or soapsuds      enemas.  5. We will replete potassium chloride in the IV fluids.  6. We will decrease the pill burden.  7. We will treat the patient's pain with morphine as needed.      Antiemetic therapy will be given with Phenergan and Reglan.  8. General surgeon Dr. Colin Benton reviewed the patient's CT scan and felt      that there was no surgical indication at this time.  She will be      happy to consult if needed.      Elliot Cousin, M.D.  Electronically Signed     DF/MEDQ  D:  05/13/2007  T:  05/13/2007  Job:  [id number]   cc:   Talmadge Coventry, M.D.  Fax: [phone]   Nicki Guadalajara, M.D.  Fax: [phone]   Hedwig Morton. Juanda Chance, MD  [address]  [place]  [place] [address]   Maisie Fus A. Cornett, M.D.  [address] [place] [address]  [place] [address]

## 2011-01-14 NOTE — Discharge Summary (Signed)
NAMEARYA, [name]               ACCOUNT NO.:  [id number]   MEDICAL RECORD NO.:  [id number]          PATIENT TYPE:  INP   LOCATION:  1331                         FACILITY:  [facility]   PHYSICIAN:  Clovis Pu. Cornett, M.D.DATE OF BIRTH:  1932/09/14   DATE OF ADMISSION:  01/12/2007  DATE OF DISCHARGE:  01/18/2007                               DISCHARGE SUMMARY   ADMITTING DIAGNOSIS:  Rectal cancer.   DISCHARGE DIAGNOSIS:  Rectal cancer.   PROCEDURE PERFORMED:  Laparoscopic-assisted low anterior resection.   HISTORY OF PRESENT ILLNESS:  The patient is a pleasant, 77 year old  female found to have what appeared to be T1 lesion in her proximal  rectum.  She was admitted for laparoscopic-assisted low anterior  resection which was performed on Jan 12, 2007.   HOSPITAL COURSE:  The patient underwent laparoscopic-assisted low  anterior section.  Please see operative note for details.  Her postop  course was complicated by ileus which resolved by postop day #6.  Her  bowel function returned.  Her diet was advanced.  Wound was clean, dry,  intact.  She remained afebrile.  She was ambulated without difficulty  and able to tolerate a diet without nausea and vomiting.  She was  discharged home on postop day #7 in satisfactory condition.   DISPOSITION:  The patient will be discharged home.   DISCHARGE MEDICATIONS:  1. Hydrochlorothiazide 25 mg daily.  2. Simvastatin 20 mg daily.  3. Lopressor 25 mg daily and then Lopressor 50 mg at bedtime.  4. Tecturna 150 mg daily.   DIET:  Her diet will be a soft mechanical diet as tolerated.  She will  ambulate daily and refrain from lifting and driving for least 2-3 weeks.  She will return on Friday to have her staples removed in my office.   CONDITION ON DISCHARGE:  Improved.      Thomas A. Cornett, M.D.  Electronically Signed     TAC/MEDQ  D:  01/18/2007  T:  01/18/2007  Job:  [id number]   cc:   Hedwig Morton. Juanda Chance, MD  [address]  [place]  [place] [address]   Rachael Fee, MD  [address]  [place], [place] [address]

## 2011-01-14 NOTE — Discharge Summary (Signed)
NAMEDONNARAE, RAE               ACCOUNT NO.:  [id number]   MEDICAL RECORD NO.:  [id number]          PATIENT TYPE:  INP   LOCATION:  1428                         FACILITY:  [facility]   PHYSICIAN:  Hillery Aldo, M.D.   DATE OF BIRTH:  [birth year]   DATE OF ADMISSION:  05/12/2007  DATE OF DISCHARGE:  05/20/2007                               DISCHARGE SUMMARY   PRIMARY CARE PHYSICIAN:  Dr. Rise Mu Mazzocchi.   GASTROENTEROLOGIST:  Dr. Juanda Chance.   DISCHARGE DIAGNOSES:  1. Abdominoperineal resection anastomotic stricture.  2. Ischemic colitis.  3. Stercoral ulcer.  4. Diarrhea.  5. History of colon cancer, status post resection.  6. Hypertension.  7. Hypokalemia.  8. Hyperlipidemia.  9. History of paroxysmal atrial fibrillation.   DISCHARGE MEDICATIONS:  1. Ciprofloxacin 500 mg q.12 h.  2. Flagyl 500 mg q.8 h.  3. Aspirin 81 mg daily.  4. Protonix 40 mg daily for 1 week, then p.r.n. for reflux.  5. Metoprolol 25 mg q.a.m., 50 mg q.p.m.  6. Fiorinal one tablet q.4 h. p.r.n.  7. MiraLax 17 g in 8 ounces of water daily.  8. Tekturna 150 mg daily.   NOTE:  The patient was instructed to discontinue hydrochlorothiazide  until she sees her primary care physician in followup.   CONSULTATIONS:  Dr. Christella Hartigan of Gastroenterology.   PROCEDURES AND DIAGNOSTIC STUDIES:  1. CT scan of the abdomen and pelvis on May 13, 2007 showed no      significant finding in the pelvis other than a large amount of      fecal matter in the colon.  There was a development of      colitis/pericolitis in the left upper quadrant.  Nonspecific low      density in the posterior segment of the right lobe of the liver.  2. Repeat CT scan of the abdomen and pelvis on May 17, 2007      showed continued changes consistent with colitis primarily      involving the descending colon with no evidence for bowel      perforation or abscess.  3. Flexible sigmoidoscopy on May 19, 2007 showed  abdominoperineal resection anastomotic stricture (lumen was 7 mm,      but was partially dilated with a scope passage to 9-10 mm).      Findings in left colon consistent with ischemic colitis.  She was      very constipated on CT scan last week.  Perhaps the anastomotic      stricture is causing partial obstruction and the severe      constipation caused colitis in stercoral ulcer-type fashion.      Recommendations were to complete a full 2-week course of      antibiotics, advance her diet, and to start MiraLax to keep the      stool soft.  She may need more formal dilation of the anastomotic      stricture in the future and plans to follow up with Dr. Juanda Chance in 2-      3 weeks.   DISCHARGE LABORATORY VALUES:  Sodium was 140, potassium  3.4 (repleted),  chloride 108, bicarb 26, BUN 3, creatinine 0.72, glucose 94.  White  blood cell count was 4.4, hemoglobin 11.5, hematocrit 34.5, platelets  275,000.   HOSPITAL COURSE:  PROBLEM #1 - DIARRHEA SECONDARY TO COLITIS:  The  patient was admitted and empirically put on Cipro and Flagyl.  Radiographic imaging was done with the findings as noted above.  A GI  consultation was requested and kindly provided by Dr. Christella Hartigan.  The  patient did undergo flexible sigmoidoscopy with findings and  recommendations as noted above.  She will complete a 2-week course of  Flagyl and Cipro.   PROBLEM #2 - HISTORY OF COLON CANCER, STATUS POST RESECTION:  The  patient does have evidence of an anastomotic stricture and will likely  need to follow up Dr. Juanda Chance for consideration of additional dilatation  if needed.   Problem #3 - HYPERTENSION:  The patient's blood pressures were actually  soft on admission and her Tekturna and hydrochlorothiazide were  discontinued.  Her metoprolol dose was decreased to 25 mg b.i.d.  Her  blood pressures have come up and she should resume her Tekturna and her  metoprolol at the usual dose, but I would continue to hold the   hydrochlorothiazide until she follows up with her primary care physician  for a repeat blood pressure check.   PROBLEM #4 - HYPERLIPIDEMIA:  The patient was maintained on a low-fat  diet.   PROBLEM #5 - PAROXYSMAL ATRIAL FIBRILLATION:  The patient had 1 episode  of tachycardia, but overall her rates have been in the 60s.  Her  telemetry rhythm has mostly been normal sinus.   PROBLEM #6 - HYPOKALEMIA:  The patient was repleted prior to discharge.   DISPOSITION:  The patient is medically stable for discharge and wishes  to return home.   FOLLOWUP:   SHE:  Should follow up with Dr. Juanda Chance in 2-3 weeks and with her primary  care physician in 1-2 weeks.      Hillery Aldo, M.D.  Electronically Signed     CR/MEDQ  D:  05/20/2007  T:  05/20/2007  Job:  [id number]   cc:   Talmadge Coventry, M.D.  Fax: [phone]   Hedwig Morton. Juanda Chance, MD  [address]  [place]  [place] [address]

## 2011-01-14 NOTE — Assessment & Plan Note (Signed)
[facility]                         GASTROENTEROLOGY OFFICE NOTE   NAME:BUNTONJodee, Turner                      MRN:          [id number]  DATE:06/01/2007                            DOB:          December 28, 1932    [name] is a 77 year old white female with a history of rectal cancer  diagnosed in March 2008 status post abdominal perineal resection by Dr.  Luisa Hart in May 2008, which was laparoscopically assisted.  She was  recently hospitalized at [facility] between September 10 and  May 20, 2007 with severe ischemic colitis at the sigmoid  anastomosis, which started to stricture down.  She presented with fecal  impaction.  The patient was discharged on Cipro 500 mg b.i.d. and Flagyl  500 mg q.8h.  She has been off the antibiotics for 3 days.  She also  takes MiraLax 17 g daily, which causes her to have up to 10 liquidy  bowel movements a day.  Her rectum is quite sore.  She denies any fever,  but her general level of energy has been rather low, and she has not  been feeling well.  She was advised to stay on a bland diet, which to  her translates to eating only starchy foods.  She has not had any  vegetables, fruit, or any variety of diet.   MEDICATIONS:  1. MiraLax 17 g daily.  2. Tekturna 150 mg p.o. daily.  3. Toprol 75 mg p.o. daily.   PHYSICAL EXAM:  Blood pressure 136/68, pulse 80, and weight 187 pounds.  Her initial weight after surgery was 196 pounds.  She was alert and oriented, somewhat depressed-appearing.  LUNGS:  Clear to auscultation.  COR:  Normal S1, normal S2.  ABDOMEN:  Soft with well-healed vertical scar below the umbilicus.  Hyperactive bowel sounds.  Mild tenderness on deep pressure to left  lower and left middle quadrants.  No rebound.  No distension.  RECTAL:  Exam reveals somewhat tender and sensitive anal canal.  Stool  was soft, Hemoccult negative.   IMPRESSION:  44. A 77 year old white female status post abdominal  perineal resection      for rectal carcinoma with development of ischemic colon stricture      and ischemic seminal colitis, as per flexible sigmoidoscopy by Dr.      Christella Hartigan on May 19, 2007.  2. Constipation secondary to #1.  The patient is now responding to      MiraLax, but is having too much diarrhea.  3. Iron deficiency anemia.   PLAN:  1. Decrease MiraLax to 9 g every other day.  Goal would be to titrate      her bowel movements to about 2 soft bowel movements daily.  2. Stop antibiotics and repeat CBC today.  3. Analpram cream 2.5% samples given for rectal irritation.  4. Iron pills.  Samples of Tandem Plus given to take daily.  5. Flexible sigmoidoscopy scheduled for first week in November, prior      to her appointment with Dr. Luisa Hart, which has been scheduled for      July 08, 2007.  Today,  we are checking her CBC, iron studies, as      well as sedimentation rate.     Hedwig Morton. Juanda Chance, MD  Electronically Signed    DMB/MedQ  DD: 06/01/2007  DT: 06/01/2007  Job #: [id number]   cc:   Talmadge Coventry, M.D.  Thomas A. Cornett, M.D.

## 2011-01-14 NOTE — Discharge Summary (Signed)
[name], [name]               ACCOUNT NO.:  [id number]   MEDICAL RECORD NO.:  [id number]          PATIENT TYPE:  INP   LOCATION:  1428                         FACILITY:  [facility]   PHYSICIAN:  Herbie Saxon, MDDATE OF BIRTH:  1933/06/11   DATE OF ADMISSION:  05/12/2007  DATE OF DISCHARGE:                               DISCHARGE SUMMARY   INTERIM DISCHARGE SUMMARY.   DATE OF DISCHARGE:  To be determined.   GASTROENTEROLOGIST:  Dr. Lina Sar.   RADIOLOGY:  The CT abdomen and pelvis on admission shows development of  colitis in left upper quadrant with a large amount of fecal matter.  There is a nonspecific low density in the posterior segment of the right  lobe of the liver.  The repeat CT abdomen on May 17, 2007, shows a  small hiatal hernia, no evidence of bowel perforation or bowel  obstruction.  He has abnormal wall thickening and pericolonic fat  stranding involving the descending colon to the level of the sigmoid  colon which is consistent with continued changes of colitis with no  evidence of abscess formation.  The liver morphology is normal.   HOSPITAL COURSE:  This 74-year Caucasian lady with a past medical  history of rectal cancer status post colon resection May, 2008, also  with a history of hypertension and hyperlipidemia, presented with  abdominal pain, nausea.  Also note the patient has a primary history of  paroxysmal atrial fibrillation.  She continued having leukocytosis which  was probably secondary to the colitis and she had hypokalemia of 3.3  which was supplemented.  Her HCTZ therapy was held.  Initial Hemoccult  was positive, however.  Patient's hematocrit has stayed stable with no  active rectal bleed.  Diarrhea is still persisting, although patient  symptomatically feels a little better.  She is complaining of a jittery  feeling, anorexia with opiate narcotics and these are being held.  She  had an episode of tachycardia on May 17, 2007.  Although this was  asymptomatic, this probably could be due to a history of paroxysmal  atrial fibrillation.  She has been placed on beta-blocker and p.r.n. IV  Cardizem for this.  The cardiac enzymes sent were negative for any acute  coronary event.  Hypertension is stable.   MEDICATIONS:  Will be dictated on discharge.   EXAMINATION TODAY:  She is elderly, not in acute distress.  Temperature  98, pulse 95, respiratory rate 18, blood pressure 140/70.  PUPILS:  Equal and reactive to light and accommodation.  NECK:  Supple.  OROPHARYNX AND NASOPHARYNX:  Clear.  HEAD:  Atraumatic, normocephalic.  HEART SOUNDS:  Irregular 1 and 2.  CHEST:  Clear.  ABDOMEN:  She has minimal left lower quadrant tenderness.  She is alert  and oriented x3, peripheral pulses present, no pedal edema.   AVAILABLE LABS:  Show a troponin of 0.01, hematocrit is 35.  Chemistry:  Sodium is 138, potassium 3.6, chloride 108, bicarbonate 25, glucose 102,  BUN 8, creatinine 0.7.   DISPOSITION:  Home in the next 2 or 3 days when symptomatically  improved.  Monitor the cardiac rhythm.  Continue with the IV  antibiotics.  As noted in the Hospital Course, the patient was started  on IV Cipro and Flagyl; this is to be continued over the next 3 days and  possibly switched to p.o. if patient is tolerating better orally.  She  was initially switched over to p.o. but with the continued nausea was  switched back to IV formulation for now and hold the discharge.  The  patient is to be placed on Toprol-XL 25 mg daily and aggressively  treated with Cardizem 10 mg IV q.4h. p.r.n. if she has heart rate  greater than 110.  Consider followup with Dr. Lina Sar, GI, as an  outpatient and Cardiology followup to be arranged by her primary care  physician, Dr. Talmadge Coventry.  To follow up with Dr. Nicki Guadalajara,  cardiologist, also as an outpatient.   Note that her general surgeon is Dr. Harriette Bouillon, primary care   physician Dr. Smith Mince, cardiologist Dr. Tresa Endo, general surgeon Dr.  Luisa Hart and gastroenterologist Dr. Lina Sar.      Herbie Saxon, MD  Electronically Signed     MIO/MEDQ  D:  05/18/2007  T:  05/18/2007  Job:  [id number]

## 2011-01-17 NOTE — Op Note (Signed)
[name], [name]               ACCOUNT NO.:  [id number]   MEDICAL RECORD NO.:  [id number]          PATIENT TYPE:  AMB   LOCATION:  DSC                          FACILITY:  [facility]   PHYSICIAN:  Robert A. Thurston Hole, M.D. DATE OF BIRTH:  27-Jun-1933   DATE OF PROCEDURE:  07/14/2005  DATE OF DISCHARGE:                                 OPERATIVE REPORT   PREOPERATIVE DIAGNOSES:  1.  Left shoulder rotator cuff tear.  2.  Left shoulder partial labrum tear.  3.  Left shoulder impingement.  4.  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   POSTOPERATIVE DIAGNOSES:  1.  Left shoulder rotator cuff tear.  2.  Left shoulder partial labrum tear.  3.  Left shoulder impingement.  4.  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   OPERATION PERFORMED:  1.  Left shoulder examination under anesthesia followed by an      arthroscopically assisted rotator cuff repair using Arthrex suture      anchor times one.  2.  Left shoulder arthroscopic debridement, partial labrum tear and partial      biceps tendon tear.  3.  Left shoulder subacromial decompression.  4.  Left shoulder distal clavicle excision.   SURGEON:  Elana Alm. Thurston Hole, M.D.   ASSISTANT:  Julien Girt, P.A.   ANESTHESIA:  General.   OPERATIVE TIME:  45 minutes.   COMPLICATIONS:  None.,   INDICATIONS FOR PROCEDURE:  [name] is a 77 year old woman who has had  significant left shoulder pain for the past six months increasing in nature  with exam and MRI documenting a rotator cuff tear with impingement and AC  joint arthropathy who has failed conservative care and is now to undergo  arthroscopy and repair.   DESCRIPTION OF PROCEDURE:  [name] was brought to the operating room on  July 14, 2005, placed on the operating table in supine position.  After  an adequate level of general anesthesia was obtained, her  left shoulder was  examined. She had full range of motion and her shoulder was stable to  ligamentous exam.  She was then placed in a beach chair position and her  shoulder and arm was prepped using sterile DuraPrep and draped using sterile  technique.  Originally through a posterior arthroscopic portal, the  arthroscope with a pump attached was placed and through an anterior portal  an arthroscopic probe was placed.  On initial inspection the articular  cartilage in the glenohumeral joint was intact, the anterior labrum,  superior labrum and posterior labrum revealed partial tearing 25 to 30%  which was debrided.  The biceps tendon anchor was intact.  The biceps tendon  showed partial tearing 25% and this was debrided.  The anterior inferior  labrum and anterior inferior glenohumeral ligament complex was intact.  Rotator cuff showed a high grade partial tear of the supraspinatus and this  was in fact noted on the bursal surface to be a significant complete tear  which was completed into  a complete tear of 50% of the supraspinatus.  The  rest of the rotator cuff was intact.  The inferior capsular recess free of  pathology.  Subacromial space was entered and a lateral arthroscopic portal  was made.  A large amount of bursitis was resected.  The rotator cuff tear  was well visualized on the bursal surface.  It was further debrided.  Impingement was noted and a subacromial decompression was carried out  removing 6 to 8 mm of the undersurface of the anterior, anterolateral and  anteromedial acromion and CA ligament release carried out as well.  The [facility]  joint showed significant spurring and degenerative changes and the distal 5  to 6 mm of clavicle was resected with a 6 mm bur. After this was done and  through an accessory portal, an Arthrex 5.5 mm suture anchor was placed in  the greater tuberosity and then each of these sutures was passed  arthroscopically through the rotator cuff tear and  tied down thus securing  the rotator cuff tear back down to the greater tuberosity.  After this was  done, the shoulder could be brought through a full range of motion with no  impingement on the repair.  At this point it is felt that all pathology had  been satisfactorily addressed.  The instruments were removed.  Portals were  closed with 3-0 nylon suture and wounds injected with 0.25% Marcaine with  epinephrine.  Sterile dressings and sling applied and the patient awakened  and taken to recovery room in stable condition.   FOLLOW UP:  [name] will be followed as an outpatient on Mepergan Fortis  and Darvocet for pain with early physical therapy.  See her back in the  office in a week for suture removal and follow-up.      Robert A. Thurston Hole, M.D.  Electronically Signed     RAW/MEDQ  D:  07/14/2005  T:  07/15/2005  Job:  [id number]

## 2011-01-17 NOTE — Consult Note (Signed)
NAMEBUFFY, [name] NO.:  [id number]   MEDICAL RECORD NO.:  [id number]          PATIENT TYPE:  INP   LOCATION:  2030                         FACILITY:  [facility]   PHYSICIAN:  Nicki Guadalajara, M.D.     DATE OF BIRTH:  May 17, 1933   DATE OF CONSULTATION:  09/14/2006  DATE OF DISCHARGE:  06/12/2006                                 CONSULTATION   DISCHARGE DIAGNOSES:  1. Atrial fibrillation.  2. Mild mitral regurgitation.  3. Hypertension.  4. Dyslipidemia.   HISTORY:  [name] is a pleasant 77 year old female with a history of  palpitations and mitral valve prolapse.  She has a history of what was  thought to be PSVT, but this was never documented.  She presented to our  office on June 09, 2006 with complaints of tachycardia which started  around 12 noon.  This was accompanied by shortness of breath and chest  tightness.  Cryo-massage was performed in the office, which revealed  atrial fibrillation.  She was given IV Lopressor without conversion to  sinus rhythm.  Please see H&P for further details.   HOSPITAL COURSE:  [name] was admitted to telemetry and Coumadin  therapy was initiated.  She was given Lopressor 50 mg p.o. q.8h. and  ruled out for myocardial infarction.  She remained in AFib despite IV  Lopressor given once again and she was started on heparin/Coumadin  crossover.  Dr. Jacinto Halim discussed with her the option of D/C cardioversion  as an attempt to restore normal sinus rhythm, however, on the day of the  cardioversion, the patient was in normal sinus rhythm.  Therefore, it  was D/C'd.  On June 10, 2006, she had converted spontaneously.  She  remained hospitalized while awaiting her INR to become therapeutic.  Because this was a slow process, she was changed to Lovenox and was  discharged home with Lovenox, Coumadin and close follow-up of her  PT/INR.   DISCHARGE INSTRUCTIONS:  She is to follow a low-fat, low-cholesterol,  low-sodium diet.   Activities with no restrictions.   DISCHARGE MEDICATIONS:  1. Toprol XL 50 mg b.i.d.  2. Protonix 40 mg daily.  3. Hydrochlorothiazide 12.5. mg daily.  4. Zocor 40 mg daily.  5. Coumadin 5 mg one-half tablet on the day of discharge, Saturday,      Sunday.  6. Lovenox 85 mg one injection b.i.d.  She is to have PT/INR drawn      Monday morning and have that faxed to [place] ASAP.   FOLLOW UP:  She is to see Dr. Tresa Endo or his PA in two to three weeks.  This has been scheduled on June 26, 2006 at 11 AM.     ______________________________  Charmian Muff, NP    ______________________________  Nicki Guadalajara, M.D.    LS/MEDQ  D:  09/14/2006  T:  09/15/2006  Job:  [id number]

## 2011-01-17 NOTE — Op Note (Signed)
NAME:  [name], [name]                         ACCOUNT NO.:  [id number]   MEDICAL RECORD NO.:  [id number]                   PATIENT TYPE:  INP   LOCATION:  X010                                 FACILITY:  [facility]   PHYSICIAN:  Naima A. Dillard, M.D.              DATE OF BIRTH:  Dec 17, 1932   DATE OF PROCEDURE:  01/02/2004  DATE OF DISCHARGE:                                 OPERATIVE REPORT   PREOPERATIVE DIAGNOSES:  Complex septated right ovarian cyst, 3.7 cm.   POSTOPERATIVE DIAGNOSES:  Benign ovarian cyst and benign fibroma by frozen  section.   OPERATION:  Operative laparoscopy, right salpingo-oophorectomy, pelvic  washings.   Ovary was sent for frozen section and noted to be a fibroma with benign  simple cyst.   ANESTHESIA:  General endotracheal tube.   ESTIMATED BLOOD LOSS:  Less than 50 mL.   IV FLUIDS:  1500 mL crystalloid.   URINE OUTPUT:  75 mL clear urine at end of the procedure.   SURGEON:  Naima A. Normand Sloop, M.D.   ASSISTANTMarquis Lunch. Adline Peals.   FINDINGS:  Normal appearing uterus, small fibroids on the posterior aspect  of the uterus about 1 cm in size.  Normal appearing left and right tubes.  There was an absent left ovary and pretty large right ovary probably  measuring about 3-4 cm with several simple appearing cysts with a hard kind  of texture to it which would be consistent with fibroma.   COMPLICATIONS:  None.   DESCRIPTION OF PROCEDURE:  Before the patient was taken to the operating  room, she was told the risks are but not limited to bleeding, infection,  damage to internal organs such as bowel, bladder and major blood vessels.  She was taken to the operating room, given general anesthesia, placed in  dorsal lithotomy position and prepped and draped in the normal sterile  fashion. Attention was turned to the vagina with a bivalve speculum was  placed into the vagina. The anterior lip of the cervix was grasped with a  single tooth tenaculum. The  Hulka manipulator was then placed into the  uterine cavity. The single tooth tenaculum was removed and the bivalve  speculum was removed. Attention was then turned to the patient's umbilicus  where a vertical 10 mm infraumbilical incision was then made with a knife.  The Veress was placed at a 45 degree angle __________ abdominal wall.  Intraabdominal placement was confirmed with decrease in CO2 pressure.  The  abdomen was insufflated with about 3 liters of CO2 gas, Veress needle was  removed.  The 10 mm trocar was placed at a 45 degree angle, the findings  noted above were seen. The patient also had normal abdominal anatomy, normal  appearing liver.  Her appendix is absent from recent surgery.  Pelvic  washings were then obtained.  7 mL of Marcaine was placed into the incision  before  the incision was made. A small incision was made in the right lower  quadrant and 5 mm trocar was placed under direct visualization, hemostasis  was assured.  About a 10 mm incision was made 2 cm above the symphysis pubis  and a 10 mm trocar was placed into this area.  Hemostasis was assured. The  right round ligament was cauterized with tripolar and cut, the right uterine  ovarian ligament was cauterized with tripolar cautery and cut. Before the  right uterine ovarian ligament was cauterized and cut, the ureter was found.  Two #0 Vicryl endoloops were placed around right infundibulopelvic ligament.  Before the endoloops were placed, the ureter was found and noted to be far  away from placement of the endoloops. The endoloops were placed and cut. The  ovary was placed in an EndoCatch bag and removed out of the suprapubic  incision.  The abdomen was irrigated with saline, all areas were noted to be  hemostatic. All trocars were removed under direct visualization. The fascia  from both sites, infraumbilical and suprapubic fascia were closed with #0  Vicryl.  The skin incisions were closed with 3-0 Monocryl in a  subcuticular  fashion. Sponge, lap and needle counts were correct x2.  The Hulka  manipulator was removed from the cervix and there was noted to be hemostasis  noted. Again sponge, lap and needle counts were correct x2.  The patient  went to the recovery room in stable condition.                                               Naima A. Normand Sloop, M.D.    NAD/MEDQ  D:  01/02/2004  T:  01/02/2004  Job:  [id number]

## 2011-01-17 NOTE — Op Note (Signed)
NAMEALEXEI, EY               ACCOUNT NO.:  [id number]   MEDICAL RECORD NO.:  [id number]          PATIENT TYPE:  INP   LOCATION:  1331                           FACILITY:   PHYSICIAN:  Maisie Fus A. Cornett, M.D.DATE OF BIRTH:  06-14-1933   DATE OF PROCEDURE:  01/12/2007  DATE OF DISCHARGE:                               OPERATIVE REPORT   PREOPERATIVE DIAGNOSIS:  Rectal adenocarcinoma.   POSTOPERATIVE DIAGNOSIS:  Rectal adenocarcinoma.   PROCEDURE:  1. Laparoscopic-assisted low anterior resection.  2. Takedown of splenic flexure.  3. Rigid sigmoidoscopy.   SURGEON:  Maisie Fus A. Cornett, MD.   ASSISTANT:  Ardeth Sportsman, MD.   ANESTHESIA:  General endotracheal anesthesia.   ESTIMATED BLOOD LOSS:  700 ml.   DRAINS:  One #19 Blake drain to pelvis.   SPECIMENS:  Distal sigmoid colon and proximal rectum analyzed by  Pathology found to have a clear distal margin of 2 cm, and the frozen  section of the specimen showed adenocarcinoma within a polyp.   INDICATIONS FOR PROCEDURE:  The patient is a 77 year old female found to  have a T1 adenocarcinoma in the proximal rectum.  Initial colonoscopy  showed it at about 7 cm, and a subsequent ultrasound examination showed  it at about 11 to 12.  She is going in today for a laparoscopic-assisted  low anterior resection.  I discussed the procedure with the patient  preop as well as potential complications of bleeding, infection, DVT,  cardiac issues, anastomotic leak, the need for ostomy, and so forth.  She understood the above and agreed to proceed.   DESCRIPTION OF PROCEDURE:  The patient was brought to the operating room  and placed supine.  After induction of general  anesthesia, she was  placed in lithotomy.  The perineum and abdomen were then prepped and  draped in a sterile fashion with the right arm being tucked and the left  arm being put out.  An orogastric tube was used, and she received  preoperative antibiotics.  A 1 cm  infraumbilical incision was made and  dissection was carried down to her fascia.  I opened her fascia and  entered into the abdominal cavity with my finger bluntly.  A pursestring  suture of #0 Vicryl was placed, and a Hassan cannula was placed under  direct vision.  Pneumoperitoneum was created to 15 mmHg.  The patient  was then placed in a head-up position.  I placed four 5-mm ports, two in  the right mid abdomen and two in the left mid abdomen.  I began my  dissection of the splenic flexure to mobilize this.  The omentum was  picked up, and I used the harmonic scalpel to take down the splenic  flexure with good hemostasis.  Once the entire splenic flexure was taken  down, I continued down the left colic gutter to mobilize the entire left  colon so I had adequate length to reach into her pelvis.  We took this  down all the way into the pelvis itself and was able to mobilize the  descending colon and sigmoid colon.  The  ureter was identified  laparoscopically and was kept well away from it as well as the gonadal  vessels.  The patient did have her uterus and ovaries still in place.  Once I was able to mobilize the entire sigmoid colon down into the  pelvis, this was very floppy.  Unfortunately, she had a very redundant  sigmoid colon that flopped down into the pelvis.  We were able to put  her in steep Trendelenburg to help slide this out due to the redundancy  of her distal sigmoid colon and a very deep pelvis.  This was going to  be very difficult to do laparoscopically given the very small nature of  this tumor, which was very small.  At this point since I had mobilized  the majority of the colon, I decided to go ahead and convert this to an  open procedure to get down into her pelvis better since there was  significant redundancy of her distal sigmoid colon, and this was small  and would be very difficult to find without doing so.  At this point in  time, all the CO2 was released and  the laparoscopic instruments were  removed and passed off the field.   A lower midline incision was used from just below the umbilicus to just  above the pubic symphysis.  We entered the abdominal cavity easily and  then placed a Balfour retractor.  We used sponges to pack the small  bowel in the upper abdomen with the patient in Trendelenburg.  I then  ran my hand down into the cul-de-sac, and she still had her uterus in  place.  We used a silk suture to suspend the uterus on hemostats so we  could better see the cul-de-sac.  This was very deep.  She had an  extremely deep pelvis and this was under-appreciated laparoscopically.  We went ahead and picked a point in the distal sigmoid to check some of  the redundant sigmoid colon, and I went ahead and divided the sigmoid  colon at this point.  The splenic flexure was well-mobilized, and we had  adequate length that would reach way down into the pelvis we felt.  Once  I had divided the distal sigmoid colon by using a GIA-75 stapler, we  used the LigaSure to dissect down and take the mesentery at the base of  the IMA and then proceeded toward the sacral promontory.  Care was taken  to keep the ureter in sight, and we periodically went over to check and  make sure this was well out of our dissection field, which it was.  I  then scored the remainder of the peritoneal lining when I got down to  the rectum and then carried this over anteriorly in between the vagina  and the anterior wall of the rectum.  Once I was able to score and open  this tissue plane up, we went ahead and went toward the right side and  scored this as well.  We identified the right ureter as well and we were  well away from it.  Once we were able to mobilize this, I then went  below with the rigid sigmoidoscope.  Initially I saw a tumor at about 10  cm, but we had a difficult time visualizing this.  So we went ahead and dissected some more and got down to about 8 cm on the  rectum.  I put the  scope back in and was able to identify  the polypoid lesion on the  patient's left anterior wall, which corresponded to the colonoscopic  report, but it was very small and very difficult to see.  We used the  scope to judge how far down the rectum would go, and we put a stitch  just distal to this.  I then created a window around the rectum at this  point and fired a concave stapling device just distal to the tumor.  We  then took a LigaSure to take down the remainder of the mesentery to the  rectum that was going to this and passed the entire specimen to a back  table.  At this point, I opened it and found the polypoid lesion and put  a stitch on it.  The pathologist looked at it grossly and said we had a  2 cm distal margin on this.  I asked him to freeze this to make sure it  was corresponding to the colonoscopic biopsy since it was hard for me to  tell if this had actually been biopsied or not looking at it.  A small  piece was taken and found to be adenocarcinoma of frozen section.  At  this point in time, I put the proctoscope back in the rectal stump,  insufflated it, and we saw no leaks with fluid in the pelvis.  We then  inspected the pelvis and found it to be hemostatic.  We identified both  ureters and found these to be out of harm's way.  After this was done, I  reinserted the proctoscope and this led to the stump and found it to be  hemostatic and sealed.  I changed gloves and went back into the  operative field.  We then found the distal descending colon, placed a  pursestring applicator over this, and put a pursestring through it for  an EEA stapling device.  A 29 fit quite easily.  The 29 EEA anvil was  placed, and we tied this pursestring down.  I then went back below. I  placed my finger into the vagina and a second finger into the anal canal  for positive identification.  I then kept my finger in the vagina and  then placed the dilator through the  rectal stump without difficulty.  I  then advanced the EEA stapling device and then deployed the spike.  We  placed the anvil on the spike, closed down, and fired the stapling  device without difficulty.  I removed the stapling device and had two  complete donuts.  Rigid sigmoidoscopy was performed again, and I was  able to go to the anastomosis, found it to be hemostatic with no signs  of bleeding, and it was widely patent.  We insufflated air through this  with water in the pelvis and found no evidence of leakage from the  anastomosis.  We suctioned out all excess irrigation.  I placed a #19  Blake drain in the pelvis due to the pelvic dissection we had today.  We  reinspected and found both ureters and found these to be intact without  injury or leakage of urine.  At this point in time, we found the  mesentery to be hemostatic.  We removed all the packing sponges and  passed them off the field.  The retractor was then subsequently removed. Through a separate stab incision, a #19 Blake drain was placed as stated  above.  At this point in time, irrigation was used to suction down until  clear.  The wound was closed using a #1 PDS for the fascial closure.  Skin staples were used to close the skin.  Laparoscopic port incisions  were closed with staples.  Dry dressings were applied.  A drain was  placed to bulb suction.  All final counts of sponge, needle, and  instruments were found to be correct at this portion of the case.  The  patient was awoke and taken to recovery in satisfactory condition.      Thomas A. Cornett, M.D.  Electronically Signed     TAC/MEDQ  D:  01/12/2007  T:  01/12/2007  Job:  [id number]   cc:   Hedwig Morton. Juanda Chance, MD  [address]  [place]  [place] [address]   Rachael Fee, MD  [address]  [place], [place] [address]

## 2011-01-17 NOTE — Assessment & Plan Note (Signed)
[facility]                         GASTROENTEROLOGY OFFICE NOTE   ZANYAH, LENTSCH                      MRN:          [id number]  DATE:10/22/2006                            DOB:          [birth year]    Ms. Bamford is a very nice 77 year old patient of Dr. Tresa Endo and Dr.  Smith Mince who comes today with intermittent hematochezia.  She describes  small amounts of bright red blood on the toilet tissue, as well as in  the commode.  The episodes started after she was started on Coumadin in  October 2007 after being hospitalized for paroxysmal atrial  fibrillation.  Because of the intolerance to Coumadin and continued  rectal bleeding, Coumadin was discontinued but the bleeding has  continued.  The last episode was this morning.  Sometimes she evacuates  some mucus with blood.  Her hemoglobin in October was normal at 14 g.  We have done a colonoscopy on her in May 2006 because of family history  of colon cancer in her father.  She had hyperplastic polyp of the left  colon removed.  Her scheduled recall colonoscopy was in 5 years.  She  denies abdominal pain, weight changes, or anorectal pain.  The patient  feels she may have a hemorrhoid but denies any pain with evacuation.  She has been quite constipated due to taking Toprol-XL 75 mg daily,  hydrochlorothiazide 25 mg p.o. daily, and calcium supplements.   PAST HISTORY:  Significant for paroxysmal atrial fibrillation, high  blood pressure, angina, hyperlipidemia, arthritic complaints.   FAMILY HISTORY:  Positive for heart disease in her father.   SOCIAL HISTORY:  No information.  The patient does not smoke and does  not drink alcohol.   REVIEW OF SYSTEMS:  Positive for arthritic complaints and rectal  bleeding.   PHYSICAL EXAMINATION:  VITAL SIGNS:  Blood pressure 138/82, pulse 64,  and weight 196 pounds.  GENERAL:  She was alert, oriented, in no distress.  HEENT:  Sclerae are not icteric.  NECK:   Supple, no adenopathy.  LUNGS:  Clear to auscultation.  CARDIAC:  Normal S1, normal S2.  ABDOMEN:  Soft, nontender, with post appendectomy scar in right lower  quadrant.  Liver edge at costal margin.  RECTAL:  Anoscopic exam shows normal perianal area.  Rectal tone was  normal.  There were some spiculated papillae at the dentate line and  small hemorrhoids but no fissure or no active bleeding.  Stool was heme  positive.  There was no evidence of proctitis.   IMPRESSION:  A 77 year old white female with intermittent hematochezia  which continues even after discontinuation of the Coumadin.  Based on  the description of the blood, this is most likely a left colon bleed.  She had essentially normal colon exam 2 years ago with only small  hyperplastic polyp.  The patient has strong family history of colon  cancer in her father.  Because I cannot demonstrate any lesion on the  anoscopic exam today I have to suspect that there may be a lesion higher  up which either might have been missed on colonoscopy or  has evolved  since the colonoscopy.  For that reason, she needs to have another  colonoscopy.   PLAN:  1. Colonoscopy scheduled.  2. Depending on the results, she may need specific treatment for      rectal lesions such as small fissures which could not be      demonstrated on today's exam.  3. Continue all medications prior to colonoscopy.  4. High-fiber diet, Benefiber samples given to take on a daily basis      for constipation.     Hedwig Morton. Juanda Chance, MD  Electronically Signed    DMB/MedQ  DD: 10/22/2006  DT: 10/22/2006  Job #: [id number]   cc:   Nicki Guadalajara, M.D.  Talmadge Coventry, M.D.

## 2011-01-17 NOTE — Assessment & Plan Note (Signed)
[facility]                         GASTROENTEROLOGY OFFICE NOTE   CALIANNE, LARUE                      MRN:          [id number]  DATE:11/10/2006                            DOB:          1932/11/04    Ms. Tanney is a very nice 77 year old white female with rectal bleeding  who underwent colonoscopy on November 05, 2006, with findings of a flat  polyp in the rectosigmoid colon at 10 cm from the anal verge.  The polyp  was difficult to see but we were able to snare most of it.  The  pathology came back yesterday with invasive adenocarcinoma.  I am seeing  the patient today in the office to discuss the diagnosis as well as  plans for treatment.  The patient still continues to have a small amount  of rectal bleeding.  She denies any abdominal pain.  There is a family  history of colon cancer in her parent.  She actually had a colonoscopy 2  years ago and had a hyperplastic polyp of the left colon.  At that time  we were not able to see the lesion in the rectosigmoid colon.  It was  actually very difficult to see it  this time because it is so flat and  circular and  hiding behind the folds. It is not obstructing.  I have  discussed this with the patient and she is in complete understanding of  the situation.  She would like to go ahead and have this removed.   PHYSICAL EXAMINATION:  Blood pressure 170/82, pulse 60, weight 196  pounds.  The patient was not examined today.   IMPRESSION:  1. 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  2. Positive family history of colon cancer and personal history of      benign polyps.  3. History of paroxysmal atrial fibrillation followed by Dr. Tresa Endo.  4. Hypertension.   PLAN:  1. A CT scan of the abdomen and pelvis today with IV contrast.  2. I have spoken to Dr. Tresa Endo with whom she has appointment with      tomorrow at 11 a.m. for preoperative clearance.  3. Appointment with Dr. Abbey Chatters; so  far December 01, 2006; but I left a      message for Dr. Abbey Chatters to see if he could move her down and see      her prior to that time.  The lesion resection will have to be      either local or with abdominal approach.  The lesion is at 10 cm      but it is rather small.  Dr. Abbey Chatters will probably have to      reexamine the patient to assess whether a segmental resection would      be feasible or whether she may not need      radiation first.  I have mentioned all these options to the patient      so she will be ready to discuss it with Dr. Abbey Chatters.     Hedwig Morton. Juanda Chance, MD  Electronically Signed  DMB/MedQ  DD: 11/10/2006  DT: 11/12/2006  Job #: [id number]   cc:   Talmadge Coventry, M.D.  Adolph Pollack, M.D.  Nicki Guadalajara, M.D.

## 2011-01-17 NOTE — H&P (Signed)
NAME:  [name], [name]                         ACCOUNT NO.:  [id number]   MEDICAL RECORD NO.:  [id number]                   PATIENT TYPE:  INP   LOCATION:  NA                                   FACILITY:  [facility]   PHYSICIAN:  Holly A. Dillard, M.D.              DATE OF BIRTH:  [birth year]   DATE OF ADMISSION:  DATE OF DISCHARGE:                                HISTORY & PHYSICAL   DIAGNOSIS:  Right ovarian complex cyst in a postmenopausal woman.   HISTORY OF PRESENT ILLNESS:  The patient is a 77 year old gravida 6, para 3,  0, 3, 3, whose last menstrual period was some time in 1993 or 1994 who  presented to me on November 16, 2003 with the diagnosis of a right ovarian  complex cyst found on ultrasound and CT scan because the patient was  complaining of pelvic pain.  Ultrasound was significant for uterus measuring  7.8 x 4.1 x 5.7 cm with questionable small fibroids.  The right ovary showed  a complex cyst measuring 3.7 x 4.1 cm in dimensions.  The left ovary could  not be seen.  The patient has an ACAT scan, which showed the same findings  and they did not see any evidence of diverticulitis or adenopathy.  The  patient CA-125 is 10.3.  The patient denies any recent weight loss or gain,  and she denies having any irregular or postmenopausal bleeding.  The patient  does have some pelvic pain, but denies having any vaginal discharge, odor,  fever, irregular periods, dyspareunia, urgency, frequency, or hematuria.  She also denies having  any history of kidney stones.  She does some  constipation.  She denies any diarrhea, rectal bleeding, nausea, or  vomiting.  She denies any history of fibroids or endometriosis.  The patient  did have a recent ultrasound with the findings noted above; and, denies any  history of sexually transmitted diseases.   PAST MEDICAL HISTORY:  The past medical history is significant for  hypertension and depression.   MEDICATIONS:  Medications include;  1.  Hydrochlorothiazide 25 mg daily.  2. Toprol XL 25-5- mg daily.  3. Wellbutrin 150 mg daily.  4. Lorazepam 0.5 mg q.h.s.   ALLERGIES:  The patient has no known drug allergies.   PAST OBSTETRICAL HISTORY:  Past OB history is significant for vaginal  delivery times three and also three miscarriages; no D&Es were performed.   SOCIAL HISTORY:  The patient denies any alcohol, drug or tobacco use.  She  is married with a supportive husband in monogamous relationship.   PAST GYNECOLOGICAL HISTORY:  Menopausal since 1993 or 1994.  No history of  sexually transmitted diseases or abnormal Pap smear.  She is monogamous with  her husband.   PAST SURGICAL HISTORY:  Past surgical history is significant for:  1. Tubal ligation.  2. D&C.  3. Mandibular surgery.  4. Appendectomy about 18 years  ago.   REVIEW OF SYSTEMS:  HEENT:  On review of systems the patient does wear  eyeglasses.  CARDIOVASCULAR:  The patient has occasional palpitations.  RESPIRATORY:  Unremarkable.  GASTROINTESTINAL:  Significant for  constipation.  MUSCULOSKELETAL:  Unremarkable.  GENITOURINARY: As above.   PHYSICAL EXAMINATION:  VITAL SIGNS:  The patient weighs 195 pounds.  Her  blood pressure is 138/70.  HEENT:  The patient's pupils are equal.  Her hearing is normal.  Her throat  is clear.  NECK:  The patient's thyroid is not enlarged.  HEART:  Heart has regular rate and rhythm.  LUNGS:  The patient's lungs are clear to auscultation bilaterally.  BREASTS:  The patient defers her breast exam and states she was recently  examined by her PCP.  BACK:  The patient has no CVA tenderness.  No spinal tenderness along her  back.  ABDOMEN:  The patient's abdomen is soft and nontender without any  organomegaly.  She has a well-healed right upper quadrant scar.  EXTREMITIES:  No clubbing, cyanosis or edema.  NEUROLOGIC EXAMINATION:  The neuro exam is within normal limits.  VAGINAL EXAMINATION:  Vulvovaginal exam is within normal  limits.  Cervix is  nontender without lesions.  Uterus is normal size, shape and consistency,  and nontender.  Adnexa; she has mild right adnexal fullness, but nontender  bilaterally.  Rectovaginal exam is within normal limits.   ASSESSMENT:  Complex right ovarian cyst, 3.7 centimeters septated on the  right ovary.   LABORATORY DATA:  Important labs are; CA-125 of 10.3.  Ultrasound as above.   PLAN:  The patient was offered a laparoscopy with removal of the ovary or  observation and repeat the ultrasound.  The patient has decided to proceed  with laparoscopy with removal of the ovary.   The patient understands that she has adhesions from her appendectomy; and,  for any other reason she may need a laparotomy.  The patient states there is  a small chance that she could have ovarian cancer.  She was given a bowel  prep and Dr. Stanford Breed will be on for back for debulking if necessary.  The patient; understands the risks of the surgery are, but not limited to,  bleeding, infection, damage to internal organs such as bowel, bladder or  major blood vessels, and problems with anesthesia.  The patient still agrees  to proceed with surgery.                                               [name], M.D.    NAD/MEDQ  D:  01/01/2004  T:  01/01/2004  Job:  [id number]

## 2011-03-10 ENCOUNTER — Other Ambulatory Visit: Payer: Self-pay | Admitting: Internal Medicine

## 2011-03-10 DIAGNOSIS — Z1231 Encounter for screening mammogram for malignant neoplasm of breast: Secondary | ICD-10-CM

## 2011-03-14 ENCOUNTER — Ambulatory Visit
Admission: RE | Admit: 2011-03-14 | Discharge: 2011-03-14 | Disposition: A | Discharge status: Home / Self Care | Payer: Medicare Other | Source: Ambulatory Visit | Attending: Internal Medicine | Admitting: Internal Medicine

## 2011-03-14 DIAGNOSIS — Z1231 Encounter for screening mammogram for malignant neoplasm of breast: Secondary | ICD-10-CM

## 2011-04-04 ENCOUNTER — Encounter (HOSPITAL_BASED_OUTPATIENT_CLINIC_OR_DEPARTMENT_OTHER): Payer: Medicare Other | Admitting: Hematology & Oncology

## 2011-04-04 ENCOUNTER — Other Ambulatory Visit: Payer: Self-pay | Admitting: Family

## 2011-04-04 DIAGNOSIS — C20 Malignant neoplasm of rectum: Secondary | ICD-10-CM

## 2011-05-01 ENCOUNTER — Encounter: Payer: Self-pay | Admitting: Internal Medicine

## 2011-05-19 ENCOUNTER — Ambulatory Visit (AMBULATORY_SURGERY_CENTER): Payer: Medicare Other | Admitting: *Deleted

## 2011-05-19 ENCOUNTER — Ambulatory Visit (INDEPENDENT_AMBULATORY_CARE_PROVIDER_SITE_OTHER): Payer: Medicare Other | Admitting: Internal Medicine

## 2011-05-19 ENCOUNTER — Other Ambulatory Visit (INDEPENDENT_AMBULATORY_CARE_PROVIDER_SITE_OTHER): Payer: Medicare Other

## 2011-05-19 ENCOUNTER — Encounter: Payer: Self-pay | Admitting: Internal Medicine

## 2011-05-19 ENCOUNTER — Telehealth: Payer: Self-pay | Admitting: *Deleted

## 2011-05-19 VITALS — BP 134/68 | HR 60 | Temp 98.4°F | Resp 16 | Wt 200.2 lb

## 2011-05-19 VITALS — Ht 66.0 in | Wt 200.6 lb

## 2011-05-19 DIAGNOSIS — H9190 Unspecified hearing loss, unspecified ear: Secondary | ICD-10-CM

## 2011-05-19 DIAGNOSIS — B351 Tinea unguium: Secondary | ICD-10-CM

## 2011-05-19 DIAGNOSIS — Z1211 Encounter for screening for malignant neoplasm of colon: Secondary | ICD-10-CM

## 2011-05-19 DIAGNOSIS — Z85038 Personal history of other malignant neoplasm of large intestine: Secondary | ICD-10-CM

## 2011-05-19 DIAGNOSIS — H9192 Unspecified hearing loss, left ear: Secondary | ICD-10-CM | POA: Insufficient documentation

## 2011-05-19 LAB — COMPREHENSIVE METABOLIC PANEL
ALT: 26 U/L (ref 0–35)
CO2: 30 mEq/L (ref 19–32)
Creatinine, Ser: 0.9 mg/dL (ref 0.4–1.2)
GFR: 69.03 mL/min (ref >60.00)
Total Bilirubin: 0.9 mg/dL (ref 0.3–1.2)

## 2011-05-19 MED ORDER — PEG-KCL-NACL-NASULF-NA ASC-C 100 G PO SOLR: ORAL | Status: DC

## 2011-05-19 MED ORDER — TERBINAFINE HCL 250 MG PO TABS: 250.0000 mg | ORAL_TABLET | Freq: Every day | ORAL | Status: DC

## 2011-05-19 NOTE — Assessment & Plan Note (Signed)
She has had trouble with her ears for many years and saw Dr. Haroldine Laws previously and his treatment did not help so she does not want to go back to seeing an ENT but she will see an audiologist for evaluation

## 2011-05-19 NOTE — Assessment & Plan Note (Signed)
Check LFT's and if those are normal will start lamisil to treat the infection

## 2011-05-19 NOTE — Patient Instructions (Signed)
Ringworm - Nail (Tinea Unguium/Onychomycosis) A fungal infection of the nail (tinea unguium/onychomycosis) is common. It is common as the visible part of the nail is composed of dead cells which have no blood supply to help prevent infection. It occurs because fungi are everywhere and will pick any opportunity to grow on any dead material. Because nails are very slow growing they require up to 2 years of treatment with anti-fungal medications. The entire nail back to the base is infected. This includes approximately ? of the nail which you cannot even see. If your caregiver has prescribed a medication by mouth, take it every day and as directed. No progress will be seen for at least 6 to 9 months. Do not be disappointed! Because fungi live on dead cells with little or no exposure to blood supply, medication delivery to the infection is slow; thus the cure is slow. It is also why you can observe no progress in the first 6 months. The nail becoming cured is the base of the nail, as it has the blood supply. Topical medication such as creams and ointments are usually not effective. Important in successful treatment of nail fungus is closely following the medication regimen that your doctor prescribes. Sometimes you and your caregiver may elect to speed up this process by surgical removal of all the nails. Even this may still require 6 to 9 months of additional oral medications. See your caregiver as directed. Remember there will be no visible improvement for at least 6 months. See your caregiver sooner if other signs of infection (redness and swelling) develop. Document Released: 08/15/2000 Document Re-Released: 02/05/2010 [employer] Patient Information 2011 [employer]. 

## 2011-05-19 NOTE — Telephone Encounter (Signed)
Dr. Juanda Chance-- Pt here for PV today for recall colonoscopy.  She says that you told her she needed antibiotics before she has colonoscopy procedures.  She does not know why.  I looked through previous procedure reports and do not see any notes from you saying that she would need antibiotics prior to procedures.  Please advise. Thanks, Ezra Sites

## 2011-05-19 NOTE — Progress Notes (Signed)
Subjective:    Patient ID: [name], female    DOB: [birth year], 77 y.o.   MRN: [id number]  HPI She returns complaining of a recurrence of toenail fungus, she took lamisil years ago and it got better but it has now returned over the last year. The great toenails are the most problematic with separation and discomfort.  Also, she complains of loss of hearing and persistent itching in her left ear. She has been using an ear drop without much relief and uses q-tips to scratch the left ear.   Review of Systems  Constitutional: Negative.   HENT: Positive for hearing loss. Negative for ear pain, nosebleeds, congestion, sore throat, facial swelling, rhinorrhea, sneezing, trouble swallowing, neck pain, neck stiffness, voice change, postnasal drip, sinus pressure, tinnitus and ear discharge.   Eyes: Negative.   Respiratory: Negative for apnea, cough, choking, chest tightness, shortness of breath, wheezing and stridor.   Cardiovascular: Negative.   Gastrointestinal: Negative.   Genitourinary: Negative.   Musculoskeletal: Negative.   Skin: Negative.   Neurological: Negative.   Hematological: Negative.   Psychiatric/Behavioral: Negative.        Objective:   Physical Exam  Vitals reviewed. Constitutional: She is oriented to person, place, and time. She appears well-developed and well-nourished. No distress.  HENT:  Right Ear: Hearing, tympanic membrane, external ear and ear canal normal.  Left Ear: Hearing, tympanic membrane and external ear normal.  Nose: Nose normal. No mucosal edema, rhinorrhea, nose lacerations, sinus tenderness, nasal deformity, septal deviation or nasal septal hematoma. No epistaxis. Right sinus exhibits no maxillary sinus tenderness and no frontal sinus tenderness. Left sinus exhibits no maxillary sinus tenderness and no frontal sinus tenderness.  Mouth/Throat: Oropharynx is clear and moist. Mucous membranes are not pale, not dry and not cyanotic. No oropharyngeal  exudate, posterior oropharyngeal edema, posterior oropharyngeal erythema or tonsillar abscesses.       Left EAC shows a tiny amount of was but there is also a scab on the posterior wall with no swelling or exudate  Eyes: Conjunctivae are normal. Right eye exhibits no discharge. Left eye exhibits no discharge. No scleral icterus.  Neck: Normal range of motion. Neck supple. No JVD present. No tracheal deviation present. No thyromegaly present.  Cardiovascular: Normal rate, regular rhythm, normal heart sounds and intact distal pulses.  Exam reveals no gallop and no friction rub.   No murmur heard. Pulmonary/Chest: Effort normal and breath sounds normal. No stridor. No respiratory distress. She has no wheezes. She has no rales. She exhibits no tenderness.  Abdominal: Soft. Bowel sounds are normal. She exhibits no distension and no mass. There is no tenderness. There is no rebound and no guarding.  Musculoskeletal: Normal range of motion. She exhibits no edema and no tenderness.  Lymphadenopathy:    She has no cervical adenopathy.  Neurological: She is oriented to person, place, and time. She displays normal reflexes. She exhibits normal muscle tone. Coordination normal.  Skin: Skin is warm and dry. No rash noted. She is not diaphoretic. No erythema. No pallor.       Both great toenails show lysis with subungual debris but there is no erythema, exudate, swelling, warmth, or ttp  Psychiatric: She has a normal mood and affect. Her behavior is normal. Judgment and thought content normal.      Lab Results  Component Value Date   WBC 6.1 04/03/2010   HGB 16.4* 04/03/2010   HCT 48.4* 04/03/2010   PLT 197 04/03/2010   ALT 19  04/03/2010   AST 20 04/03/2010   NA 143 04/03/2010   K 4.1 04/03/2010   CL 104 04/03/2010   CREATININE 0.95 04/03/2010   BUN 23 04/03/2010   CO2 25 04/03/2010      Assessment & Plan:

## 2011-05-19 NOTE — Telephone Encounter (Signed)
I have reviewed the record and do not see any reason for antibiotics for the  upcoming procedure

## 2011-06-02 ENCOUNTER — Encounter: Payer: Self-pay | Admitting: Internal Medicine

## 2011-06-02 ENCOUNTER — Ambulatory Visit (AMBULATORY_SURGERY_CENTER): Payer: Medicare Other | Admitting: Internal Medicine

## 2011-06-02 DIAGNOSIS — K5010 Crohn's disease of large intestine without complications: Secondary | ICD-10-CM

## 2011-06-02 DIAGNOSIS — K5289 Other specified noninfective gastroenteritis and colitis: Secondary | ICD-10-CM

## 2011-06-02 DIAGNOSIS — Z86010 Personal history of colonic polyps: Secondary | ICD-10-CM

## 2011-06-02 DIAGNOSIS — Z1211 Encounter for screening for malignant neoplasm of colon: Secondary | ICD-10-CM

## 2011-06-02 DIAGNOSIS — D126 Benign neoplasm of colon, unspecified: Secondary | ICD-10-CM

## 2011-06-02 DIAGNOSIS — Z85038 Personal history of other malignant neoplasm of large intestine: Secondary | ICD-10-CM

## 2011-06-02 LAB — HM COLONOSCOPY: HM Colonoscopy: NORMAL

## 2011-06-02 MED ORDER — SODIUM CHLORIDE 0.9 % IV SOLN: 500.0000 mL | INTRAVENOUS | Status: DC

## 2011-06-02 NOTE — Patient Instructions (Signed)
Discharge instructions given with verbal understanding. Biopsies taken. Resume previous medications. 

## 2011-06-03 ENCOUNTER — Telehealth: Payer: Self-pay | Admitting: *Deleted

## 2011-06-03 NOTE — Telephone Encounter (Signed)

## 2011-06-05 ENCOUNTER — Encounter: Payer: Self-pay | Admitting: Internal Medicine

## 2011-06-12 LAB — CARDIAC PANEL(CRET KIN+CKTOT+MB+TROPI)
CK, MB: 0.9
CK, MB: 1
Relative Index: INVALID
Total CK: 26
Troponin I: 0.02
Troponin I: 0.03

## 2011-06-12 LAB — BASIC METABOLIC PANEL
BUN: 3 — ABNORMAL LOW
Calcium: 8.5
GFR calc non Af Amer: >60
Glucose, Bld: 94

## 2011-06-12 LAB — CBC
HCT: 34.5 — ABNORMAL LOW
Hemoglobin: 11.5 — ABNORMAL LOW
MCV: 83.7
RBC: 4.13
WBC: 4.4

## 2011-06-13 LAB — BASIC METABOLIC PANEL
BUN: 11
CO2: 26
Chloride: 108
Chloride: 109
GFR calc non Af Amer: 59 — ABNORMAL LOW
GFR calc non Af Amer: >60
Glucose, Bld: 102 — ABNORMAL HIGH
Glucose, Bld: 125 — ABNORMAL HIGH
Potassium: 3.2 — ABNORMAL LOW
Potassium: 3.6
Sodium: 138
Sodium: 141

## 2011-06-13 LAB — DIFFERENTIAL
Basophils Absolute: 0
Basophils Relative: 0
Lymphocytes Relative: 8 — ABNORMAL LOW
Monocytes Absolute: 0.5
Monocytes Relative: 4
Neutro Abs: 13 — ABNORMAL HIGH
Neutrophils Relative %: 89 — ABNORMAL HIGH

## 2011-06-13 LAB — COMPREHENSIVE METABOLIC PANEL
Albumin: 4.3
Alkaline Phosphatase: 69
BUN: 20
Creatinine, Ser: 1.02
Glucose, Bld: 172 — ABNORMAL HIGH
Potassium: 3.3 — ABNORMAL LOW
Total Bilirubin: 1.1
Total Protein: 7.6

## 2011-06-13 LAB — URINE CULTURE: Special Requests: NEGATIVE

## 2011-06-13 LAB — HEMOGLOBIN AND HEMATOCRIT, BLOOD
HCT: 35 — ABNORMAL LOW
HCT: 35.4 — ABNORMAL LOW
HCT: 35.5 — ABNORMAL LOW
Hemoglobin: 11.3 — ABNORMAL LOW
Hemoglobin: 11.3 — ABNORMAL LOW
Hemoglobin: 11.7 — ABNORMAL LOW

## 2011-06-13 LAB — CBC
HCT: 37
HCT: 43.7
Hemoglobin: 12.6
Hemoglobin: 15
MCHC: 34
MCV: 82.6
MCV: 83.7
Platelets: 186
Platelets: 274
RDW: 13.5
RDW: 13.9

## 2011-06-13 LAB — POTASSIUM
Potassium: 3.4 — ABNORMAL LOW
Potassium: 4.2

## 2011-06-13 LAB — URINALYSIS, ROUTINE W REFLEX MICROSCOPIC
Hgb urine dipstick: NEGATIVE
Nitrite: NEGATIVE
Protein, ur: NEGATIVE
Specific Gravity, Urine: 1.016
Urobilinogen, UA: 0.2

## 2011-06-13 LAB — LIPID PANEL
Cholesterol: 142
Total CHOL/HDL Ratio: 3.4

## 2011-06-13 LAB — HEMOGLOBIN A1C: Hgb A1c MFr Bld: 5.8

## 2011-06-13 LAB — TSH: TSH: 0.955

## 2011-06-13 LAB — OCCULT BLOOD X 1 CARD TO LAB, STOOL: Fecal Occult Bld: POSITIVE

## 2011-07-21 ENCOUNTER — Encounter: Payer: Self-pay | Admitting: Internal Medicine

## 2011-07-21 ENCOUNTER — Ambulatory Visit (INDEPENDENT_AMBULATORY_CARE_PROVIDER_SITE_OTHER): Payer: Medicare Other | Admitting: Internal Medicine

## 2011-07-21 ENCOUNTER — Other Ambulatory Visit (INDEPENDENT_AMBULATORY_CARE_PROVIDER_SITE_OTHER): Payer: Medicare Other

## 2011-07-21 VITALS — BP 138/76 | HR 58 | Temp 98.0°F | Resp 16 | Ht 66.0 in | Wt 197.5 lb

## 2011-07-21 DIAGNOSIS — I10 Essential (primary) hypertension: Secondary | ICD-10-CM

## 2011-07-21 DIAGNOSIS — Z0000 Encounter for general adult medical examination without abnormal findings: Secondary | ICD-10-CM | POA: Insufficient documentation

## 2011-07-21 DIAGNOSIS — Z79899 Other long term (current) drug therapy: Secondary | ICD-10-CM

## 2011-07-21 DIAGNOSIS — E7800 Pure hypercholesterolemia, unspecified: Secondary | ICD-10-CM

## 2011-07-21 DIAGNOSIS — B351 Tinea unguium: Secondary | ICD-10-CM

## 2011-07-21 LAB — CBC WITH DIFFERENTIAL/PLATELET
Basophils Absolute: 0 10*3/uL (ref 0.0–0.1)
Eosinophils Relative: 3.9 % (ref 0.0–5.0)
HCT: 43.9 % (ref 36.0–46.0)
Hemoglobin: 14.6 g/dL (ref 12.0–15.0)
Lymphocytes Relative: 33.7 % (ref 12.0–46.0)
Lymphs Abs: 1.6 10*3/uL (ref 0.7–4.0)
Monocytes Relative: 9.8 % (ref 3.0–12.0)
Neutro Abs: 2.4 10*3/uL (ref 1.4–7.7)
RBC: 4.78 Mil/uL (ref 3.87–5.11)
RDW: 13.7 % (ref 11.5–14.6)
WBC: 4.6 10*3/uL (ref 4.5–10.5)

## 2011-07-21 LAB — COMPREHENSIVE METABOLIC PANEL
Alkaline Phosphatase: 59 U/L (ref 39–117)
BUN: 16 mg/dL (ref 6–23)
CO2: 29 mEq/L (ref 19–32)
Creatinine, Ser: 0.9 mg/dL (ref 0.4–1.2)
GFR: 66.29 mL/min (ref >60.00)
Glucose, Bld: 90 mg/dL (ref 70–99)
Sodium: 141 mEq/L (ref 135–145)
Total Bilirubin: 0.8 mg/dL (ref 0.3–1.2)

## 2011-07-21 LAB — TSH: TSH: 1.72 u[IU]/mL (ref 0.35–5.50)

## 2011-07-21 NOTE — Progress Notes (Signed)
  Subjective:    Patient ID: [name], female    DOB: 1933-02-11, 77 y.o.   MRN: [id number]  HPI She returns for f/up after being on Lamisil for two months. She is pleased with the new/normal growth in her toenails and she has not noticed any side effects.  Review of Systems  Constitutional: Negative for fever, chills, diaphoresis, activity change, appetite change, fatigue and unexpected weight change.  HENT: Negative.   Eyes: Negative.   Respiratory: Negative for cough, shortness of breath, wheezing and stridor.   Cardiovascular: Negative for chest pain, palpitations and leg swelling.  Gastrointestinal: Negative for nausea, vomiting, abdominal pain, diarrhea, constipation and abdominal distention.  Genitourinary: Negative for dysuria, urgency, frequency, hematuria, flank pain, decreased urine volume, enuresis, difficulty urinating and dyspareunia.  Musculoskeletal: Negative for myalgias, back pain, joint swelling, arthralgias and gait problem.  Neurological: Negative.   Hematological: Negative.   Psychiatric/Behavioral: Negative.        Objective:   Physical Exam  Vitals reviewed. Constitutional: She is oriented to person, place, and time. She appears well-developed and well-nourished. No distress.  HENT:  Head: Normocephalic and atraumatic.  Mouth/Throat: Oropharynx is clear and moist. No oropharyngeal exudate.  Eyes: Conjunctivae are normal. Right eye exhibits no discharge. Left eye exhibits no discharge. No scleral icterus.  Neck: Normal range of motion. Neck supple. No JVD present. No tracheal deviation present. No thyromegaly present.  Cardiovascular: Normal rate, regular rhythm and intact distal pulses.  Exam reveals no gallop and no friction rub.   Murmur heard. Pulmonary/Chest: Effort normal and breath sounds normal. No stridor. No respiratory distress. She has no wheezes. She has no rales. She exhibits no tenderness.  Abdominal: Soft. Bowel sounds are normal. She  exhibits no distension and no mass. There is no tenderness. There is no rebound and no guarding.  Musculoskeletal: Normal range of motion. She exhibits no edema and no tenderness.  Lymphadenopathy:    She has no cervical adenopathy.  Neurological: She is oriented to person, place, and time.  Skin: Skin is warm and dry. No rash noted. She is not diaphoretic. No erythema. No pallor.  Psychiatric: She has a normal mood and affect. Her behavior is normal. Judgment and thought content normal.          Assessment & Plan:

## 2011-07-21 NOTE — Patient Instructions (Signed)

## 2011-07-23 ENCOUNTER — Encounter: Payer: Self-pay | Admitting: Internal Medicine

## 2011-07-23 NOTE — Assessment & Plan Note (Signed)
I will check her LFT's today 

## 2011-07-23 NOTE — Assessment & Plan Note (Signed)
Her BP is well controlled 

## 2011-07-23 NOTE — Assessment & Plan Note (Signed)
I will check her labs today 

## 2011-10-01 ENCOUNTER — Telehealth: Payer: Self-pay | Admitting: Hematology & Oncology

## 2011-10-01 ENCOUNTER — Ambulatory Visit (HOSPITAL_BASED_OUTPATIENT_CLINIC_OR_DEPARTMENT_OTHER): Payer: Medicare Other | Admitting: Hematology & Oncology

## 2011-10-01 ENCOUNTER — Other Ambulatory Visit (HOSPITAL_BASED_OUTPATIENT_CLINIC_OR_DEPARTMENT_OTHER): Payer: Medicare Other | Admitting: Lab

## 2011-10-01 ENCOUNTER — Ambulatory Visit: Payer: Medicare Other | Admitting: Family

## 2011-10-01 ENCOUNTER — Other Ambulatory Visit: Payer: Medicare Other | Admitting: Lab

## 2011-10-01 ENCOUNTER — Encounter: Payer: Self-pay | Admitting: Hematology & Oncology

## 2011-10-01 VITALS — BP 129/69 | HR 56 | Temp 97.0°F | Ht 66.0 in | Wt 196.0 lb

## 2011-10-01 DIAGNOSIS — I4891 Unspecified atrial fibrillation: Secondary | ICD-10-CM | POA: Diagnosis not present

## 2011-10-01 DIAGNOSIS — C20 Malignant neoplasm of rectum: Secondary | ICD-10-CM

## 2011-10-01 LAB — COMPREHENSIVE METABOLIC PANEL
Albumin: 4.5 g/dL (ref 3.5–5.2)
BUN: 18 mg/dL (ref 6–23)
Calcium: 9.8 mg/dL (ref 8.4–10.5)
Chloride: 104 mEq/L (ref 96–112)
Creatinine, Ser: 0.89 mg/dL (ref 0.50–1.10)
Glucose, Bld: 90 mg/dL (ref 70–99)
Potassium: 3.9 mEq/L (ref 3.5–5.3)

## 2011-10-01 LAB — CBC WITH DIFFERENTIAL (CANCER CENTER ONLY)
BASO#: 0 10*3/uL (ref 0.0–0.2)
EOS%: 4 % (ref 0.0–7.0)
HCT: 43.8 % (ref 34.8–46.6)
HGB: 15 g/dL (ref 11.6–15.9)
LYMPH#: 1.7 10*3/uL (ref 0.9–3.3)
LYMPH%: 33.6 % (ref 14.0–48.0)
MCHC: 34.2 g/dL (ref 32.0–36.0)
MCV: 89 fL (ref 81–101)
MONO#: 0.5 10*3/uL (ref 0.1–0.9)
NEUT%: 52.1 % (ref 39.6–80.0)
RDW: 13.5 % (ref 11.1–15.7)

## 2011-10-01 NOTE — Progress Notes (Signed)
This office note has been dictated.

## 2011-10-01 NOTE — Telephone Encounter (Signed)
Mailed 03-18-12 appointment to patient

## 2011-10-02 NOTE — Progress Notes (Signed)
CC:   [name]. Holly Chance, MD [name], M.D. [name] Sloop, M.D.  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  CURRENT THERAPY:  Observation.  INTERIM HISTORY:  [name] comes in for followup.  [name] is doing well. [name] had a good year. [name] had no problems since we last saw her.  [name] and her husband are getting ready to go on a cruise in March.  They will be going to the [place].  This will be their first cruise.  I told her make sure that [name] wore sunscreen and drink a lot of water.  [name] has had no abdominal pain.  There has been no change in bowel or bladder habits.  Last colonoscopy was a couple years ago.  [name] does get regular mammograms.  I think [name] said [name] had 1 in October.  [name] does have atrial fibrillation.  This appears to be very well controlled.  [name] had been on Coumadin but currently is off of any anticoagulants.  PHYSICAL EXAMINATION:  This is a well-developed, well-nourished white female in no obvious distress.  Vital signs: 97, pulse 56, respiratory rate 18, blood pressure 129/69.  Weight is 196.  Head and neck: Normocephalic, atraumatic skull.  There are no ocular or oral lesions. There are no palpable cervical or supraclavicular lymph nodes.  Lungs: Clear bilaterally.  Cardiac:  Regular rate and rhythm with occasional extra beat.  I really cannot detect much in the way of atrial fibrillation. Abdomen:  Soft with good bowel sounds.  [name] has a well- healed laparotomy scar inferior to the umbilicus.  There is no fluid wave.  There is no abdominal mass.  There is no palpable hepatosplenomegaly. Back: No tenderness over the spine, ribs, or hips. Extremities:  No clubbing, cyanosis or edema.  Skin:  No rashes, ecchymosis or petechia.  LABORATORY DATA:  White cell counts is 5, hemoglobin 15, hematocrit 144, platelet count 164.  IMPRESSION:  [name] is a 78 year old white female with history of stage I rectal cancer.  [name]  underwent resection back in May 2008.  There is no indication for adjuvant therapy.  [name] is doing great.  I think her risk of recurrence is going to be less than 10%.  Of note, her last CEA back in August was less than 0.5.  Also of note, her last mammogram was in July 2012.  Will plan to get [name] back in 6 months.  If all is good in 6 months, then we can get her back yearly.    ______________________________ Josph Macho, M.D. PRE/MEDQ  D:  10/01/2011  T:  10/02/2011  Job:  [id number]

## 2011-11-03 ENCOUNTER — Encounter: Payer: Self-pay | Admitting: *Deleted

## 2011-11-04 ENCOUNTER — Other Ambulatory Visit (INDEPENDENT_AMBULATORY_CARE_PROVIDER_SITE_OTHER): Payer: Medicare Other

## 2011-11-04 ENCOUNTER — Ambulatory Visit (INDEPENDENT_AMBULATORY_CARE_PROVIDER_SITE_OTHER): Payer: Medicare Other | Admitting: Internal Medicine

## 2011-11-04 ENCOUNTER — Encounter: Payer: Self-pay | Admitting: Internal Medicine

## 2011-11-04 DIAGNOSIS — R1013 Epigastric pain: Secondary | ICD-10-CM

## 2011-11-04 DIAGNOSIS — K3189 Other diseases of stomach and duodenum: Secondary | ICD-10-CM

## 2011-11-04 DIAGNOSIS — R198 Other specified symptoms and signs involving the digestive system and abdomen: Secondary | ICD-10-CM | POA: Diagnosis not present

## 2011-11-04 LAB — HEPATIC FUNCTION PANEL
Bilirubin, Direct: 0.1 mg/dL (ref 0.0–0.3)
Total Bilirubin: 0.5 mg/dL (ref 0.3–1.2)
Total Protein: 7 g/dL (ref 6.0–8.3)

## 2011-11-04 MED ORDER — METRONIDAZOLE 250 MG PO TABS: 250.0000 mg | ORAL_TABLET | Freq: Two times a day (BID) | ORAL | Status: AC

## 2011-11-04 MED ORDER — RANITIDINE HCL 150 MG PO TABS: 150.0000 mg | ORAL_TABLET | Freq: Every day | ORAL | Status: DC

## 2011-11-04 NOTE — Patient Instructions (Addendum)
We have sent the following medications to your pharmacy for you to pick up at your convenience: Flagyl 250 mg twice daily x 7 days Ranitidine 150 mg once daily. You have been scheduled for an abdominal ultrasound at [facility] (1st floor of hospital) on 11/05/11 at 9:30 am. Please arrive 15 minutes prior to your appointment for registration. Make certain not to have anything to eat or drink 6 hours prior to your appointment. Should you need to reschedule your appointment, please contact radiology at [phone]. Your physician has requested that you go to the basement for the following lab work before leaving today: Hepatic Function CC: Sanda Linger

## 2011-11-04 NOTE — Progress Notes (Signed)
[name] 07-Jan-1933 MRN [id number]   History of Present Illness:  This is a 78 year old, white female with bloating, dyspepsia and flatulence. Her last appointment in October 2012 was for colonoscopy for followup of colon cancer. She had a sigmoid resection for adenocarcinoma of the sigmoid colon in 2008. She had postoperative colitis and a stricture which had to be dilated. The last colonoscopy was normal. A CT scan of the abdomen in January 2011 showed fatty liver. She has increased frequency of her stools. She has taken Imodium occasionally. Her weight has been stable. She thinks that her symptoms are related to Lamisil which she took for a toe infection for 5 months.   Past Medical History  Diagnosis Date  . Arthritis   . Hypertension   . Iron deficiency anemia, unspecified   . Osteoporosis   . Colon cancer   . Unspecified vascular insufficiency of intestine   . HLD (hyperlipidemia)   . Coronary atherosclerosis of unspecified type of vessel, native or graft   . Personal history of other diseases of circulatory system   . Other and unspecified angina pectoris   . Atrial fibrillation   . Ischemic colitis    Past Surgical History  Procedure Date  . Tubal ligation   . Appendectomy   . Sigmoid resection for invasive rectal adenocarcinoma 12/2006  . Abdominoperineal resection anastomotic stricture 05/2007  . Dilation and curettage of uterus 1992  . Rotator cuff repair 2006    left  . Mandibular renstruction   . Rt. salpingo oophorectomy and cyst removal 2005  . Wrist surgery 2008    right    reports that she has never smoked. She has never used smokeless tobacco. She reports that she does not drink alcohol or use illicit drugs. family history includes Colon cancer (age of onset:80) in her father and Heart disease in her father. Allergies  Allergen Reactions  . Amlodipine Besylate     REACTION: swelling  . Diltiazem Hcl     REACTION: rash  . Hydrocodone-Acetaminophen       nausea  . Irbesartan-Hydrochlorothiazide     REACTION: Dizziness  . Lisinopril     cough  . Propoxyphene N-Acetaminophen     Headache, and blotchy face  . Tylenol-Codeine   . Valsartan     REACTION: blurred vision  . Warfarin Sodium     REACTION: body aches and bleeding  . Verapamil Nausea Only, Palpitations and Other (See Comments)    Headaches         Review of Systems: Occasional dysphagia. Heartburn positive for flatulence and bloating. Negative for abdominal pain  The remainder of the 10 point ROS is negative except as outlined in H&P   Physical Exam: General appearance  Well developed, in no distress. Eyes- non icteric. HEENT nontraumatic, normocephalic. Mouth no lesions, tongue papillated, no cheilosis. Neck supple without adenopathy, thyroid not enlarged, no carotid bruits, no JVD. Lungs Clear to auscultation bilaterally. Cor normal S1, normal S2, regular rhythm, no murmur,  quiet precordium. Abdomen: Soft relaxed abdomen. Nontender. Normoactive bowel sounds. Liver edge at costal margin. Rectal: Not done. Extremities no pedal edema. Skin no lesions. Neurological alert and oriented x 3. Psychological normal mood and affect.  Assessment and Plan:  Problem #1 Nonspecific dyspepsia, bloating and flatulence may be related to bacterial overgrowth. It could also be due to biliary dysfunction or side effects of medications such as Lamisil. I will start her on ranitidine 150 mg a day. We will also schedule an  abdominal ultrasound and check her liver function tests. I have given her samples of probiotics to take one by mouth daily to normalize her bacteria flora and we will give her a seven-day course of Flagyl 250 mg twice a day. She is going on a cruise in the next 4 weeks and will let us know how she does before she leaves.   11/04/2011 Lina Sar

## 2011-11-05 ENCOUNTER — Ambulatory Visit (HOSPITAL_COMMUNITY)
Admission: RE | Admit: 2011-11-05 | Discharge: 2011-11-05 | Disposition: A | Discharge status: Home / Self Care | Payer: Medicare Other | Source: Ambulatory Visit | Attending: Internal Medicine | Admitting: Internal Medicine

## 2011-11-05 DIAGNOSIS — R1013 Epigastric pain: Secondary | ICD-10-CM

## 2011-11-05 DIAGNOSIS — K760 Fatty (change of) liver, not elsewhere classified: Secondary | ICD-10-CM

## 2011-11-05 DIAGNOSIS — K3189 Other diseases of stomach and duodenum: Secondary | ICD-10-CM | POA: Diagnosis not present

## 2011-11-05 DIAGNOSIS — K7689 Other specified diseases of liver: Secondary | ICD-10-CM | POA: Insufficient documentation

## 2011-11-05 DIAGNOSIS — R198 Other specified symptoms and signs involving the digestive system and abdomen: Secondary | ICD-10-CM

## 2011-11-05 DIAGNOSIS — R109 Unspecified abdominal pain: Secondary | ICD-10-CM | POA: Insufficient documentation

## 2011-11-05 HISTORY — DX: Fatty (change of) liver, not elsewhere classified: K76.0

## 2011-11-18 ENCOUNTER — Encounter: Payer: Self-pay | Admitting: Internal Medicine

## 2011-11-18 ENCOUNTER — Ambulatory Visit (INDEPENDENT_AMBULATORY_CARE_PROVIDER_SITE_OTHER): Payer: Medicare Other | Admitting: Internal Medicine

## 2011-11-18 VITALS — BP 130/68 | HR 67 | Temp 98.1°F | Resp 16 | Wt 195.0 lb

## 2011-11-18 DIAGNOSIS — E7800 Pure hypercholesterolemia, unspecified: Secondary | ICD-10-CM | POA: Diagnosis not present

## 2011-11-18 DIAGNOSIS — I10 Essential (primary) hypertension: Secondary | ICD-10-CM

## 2011-11-18 DIAGNOSIS — B351 Tinea unguium: Secondary | ICD-10-CM

## 2011-11-18 MED ORDER — SCOPOLAMINE 1 MG/3DAYS TD PT72: 1.0000 | MEDICATED_PATCH | TRANSDERMAL | Status: DC

## 2011-11-18 NOTE — Assessment & Plan Note (Signed)
This is resolving s/p treatment with lamisil

## 2011-11-18 NOTE — Patient Instructions (Signed)

## 2011-11-18 NOTE — Assessment & Plan Note (Signed)
She is not fasting today and therefore would not do a FLP

## 2011-11-18 NOTE — Assessment & Plan Note (Signed)
Her BP is well controlled 

## 2011-11-18 NOTE — Progress Notes (Signed)
Subjective:    Patient ID: [name], female    DOB: [birth year], 78 y.o.   MRN: [id number]  Hypertension This is a chronic problem. The current episode started more than 1 year ago. The problem has been gradually improving since onset. The problem is controlled. Pertinent negatives include no anxiety, blurred vision, chest pain, headaches, malaise/fatigue, neck pain, orthopnea, palpitations, peripheral edema, PND, shortness of breath or sweats. There are no associated agents to hypertension. Past treatments include beta blockers, angiotensin blockers and diuretics. The current treatment provides significant improvement. Compliance problems include exercise and diet.       Review of Systems  Constitutional: Negative for fever, chills, malaise/fatigue, diaphoresis, activity change, appetite change, fatigue and unexpected weight change.  HENT: Negative for neck pain.   Eyes: Negative.  Negative for blurred vision.  Respiratory: Negative for cough, chest tightness, shortness of breath, wheezing and stridor.   Cardiovascular: Negative for chest pain, palpitations, orthopnea, leg swelling and PND.  Gastrointestinal: Negative for nausea, vomiting, abdominal pain, diarrhea and constipation.  Genitourinary: Negative.   Musculoskeletal: Negative.   Skin: Negative for color change, pallor, rash and wound.  Neurological: Negative.  Negative for headaches.  Hematological: Negative for adenopathy. Does not bruise/bleed easily.  Psychiatric/Behavioral: Negative.        Objective:   Physical Exam  Vitals reviewed. Constitutional: She is oriented to person, place, and time. She appears well-developed and well-nourished. No distress.  HENT:  Head: Normocephalic and atraumatic.  Mouth/Throat: Oropharynx is clear and moist. No oropharyngeal exudate.  Eyes: Conjunctivae are normal. Right eye exhibits no discharge. Left eye exhibits no discharge. No scleral icterus.  Neck: Normal range of motion.  Neck supple. No JVD present. No tracheal deviation present. No thyromegaly present.  Cardiovascular: Normal rate, regular rhythm, normal heart sounds and intact distal pulses.  Exam reveals no gallop and no friction rub.   No murmur heard. Pulmonary/Chest: Effort normal and breath sounds normal. No stridor. No respiratory distress. She has no wheezes. She has no rales. She exhibits no tenderness.  Abdominal: Soft. Bowel sounds are normal. She exhibits no distension and no mass. There is no tenderness. There is no rebound and no guarding.  Musculoskeletal: Normal range of motion. She exhibits no edema and no tenderness.  Lymphadenopathy:    She has no cervical adenopathy.  Neurological: She is oriented to person, place, and time.  Skin: Skin is warm and dry. No rash noted. She is not diaphoretic. No erythema. No pallor.  Psychiatric: She has a normal mood and affect. Her behavior is normal. Judgment and thought content normal.      Lab Results  Component Value Date   WBC 5.0 10/01/2011   HGB 15.0 10/01/2011   HCT 43.8 10/01/2011   PLT 164 10/01/2011   GLUCOSE 90 10/01/2011   CHOL  Value: 142        ATP III CLASSIFICATION:  <200     mg/dL   Desirable  220-254  mg/dL   Borderline High  >=270    mg/dL   High 02/22/7627   TRIG 103 05/15/2007   HDL 42 05/15/2007   LDLCALC  Value: 79        Total Cholesterol/HDL:CHD Risk Coronary Heart Disease Risk Table                     Men   Women  1/2 Average Risk   3.4   3.3 05/15/2007   ALT 22 11/04/2011   AST 23  11/04/2011   NA 142 10/01/2011   K 3.9 10/01/2011   CL 104 10/01/2011   CREATININE 0.89 10/01/2011   BUN 18 10/01/2011   CO2 28 10/01/2011   TSH 1.72 07/21/2011   HGBA1C  Value: 5.8 (NOTE)   The ADA recommends the following therapeutic goals for glycemic   control related to Hgb A1C measurement:   Goal of Therapy:   < 7.0% Hgb A1C   Action Suggested:  > 8.0% Hgb A1C   Ref:  Diabetes Care, 22, Suppl. 1, 1999 05/15/2007      Assessment & Plan:

## 2012-01-15 DIAGNOSIS — M1710 Unilateral primary osteoarthritis, unspecified knee: Secondary | ICD-10-CM | POA: Diagnosis not present

## 2012-02-10 ENCOUNTER — Encounter: Payer: Self-pay | Admitting: Internal Medicine

## 2012-02-10 ENCOUNTER — Ambulatory Visit (INDEPENDENT_AMBULATORY_CARE_PROVIDER_SITE_OTHER): Payer: Medicare Other | Admitting: Internal Medicine

## 2012-02-10 ENCOUNTER — Other Ambulatory Visit (INDEPENDENT_AMBULATORY_CARE_PROVIDER_SITE_OTHER): Payer: Medicare Other

## 2012-02-10 VITALS — BP 138/76 | HR 58 | Temp 98.7°F | Resp 16 | Wt 193.0 lb

## 2012-02-10 DIAGNOSIS — E7800 Pure hypercholesterolemia, unspecified: Secondary | ICD-10-CM

## 2012-02-10 DIAGNOSIS — I10 Essential (primary) hypertension: Secondary | ICD-10-CM

## 2012-02-10 DIAGNOSIS — I4891 Unspecified atrial fibrillation: Secondary | ICD-10-CM | POA: Diagnosis not present

## 2012-02-10 DIAGNOSIS — I2510 Atherosclerotic heart disease of native coronary artery without angina pectoris: Secondary | ICD-10-CM

## 2012-02-10 LAB — LIPID PANEL
HDL: 54.6 mg/dL (ref >39.00)
Triglycerides: 153 mg/dL — ABNORMAL HIGH (ref 0.0–149.0)

## 2012-02-10 LAB — COMPREHENSIVE METABOLIC PANEL
AST: 22 U/L (ref 0–37)
Albumin: 4.3 g/dL (ref 3.5–5.2)
BUN: 20 mg/dL (ref 6–23)
Calcium: 9.7 mg/dL (ref 8.4–10.5)
Chloride: 106 mEq/L (ref 96–112)
Glucose, Bld: 92 mg/dL (ref 70–99)
Potassium: 4.2 mEq/L (ref 3.5–5.1)
Total Protein: 7.3 g/dL (ref 6.0–8.3)

## 2012-02-10 NOTE — Progress Notes (Signed)
Subjective:    Patient ID: [name], female    DOB: 1933-04-19, 78 y.o.   MRN: [id number]  Hypertension This is a chronic problem. The current episode started more than 1 year ago. The problem has been gradually improving since onset. The problem is controlled. Pertinent negatives include no anxiety, blurred vision, chest pain, headaches, malaise/fatigue, neck pain, orthopnea, palpitations, peripheral edema, PND, shortness of breath or sweats. Past treatments include angiotensin blockers and diuretics. The current treatment provides moderate improvement. Compliance problems include exercise and diet.       Review of Systems  Constitutional: Negative for fever, chills, malaise/fatigue, diaphoresis, activity change, appetite change, fatigue and unexpected weight change.  HENT: Negative.  Negative for neck pain.   Eyes: Negative.  Negative for blurred vision.  Respiratory: Negative for apnea, cough, choking, chest tightness, shortness of breath, wheezing and stridor.   Cardiovascular: Negative for chest pain, palpitations, orthopnea, leg swelling and PND.  Gastrointestinal: Negative.   Genitourinary: Negative.   Musculoskeletal: Negative for myalgias, back pain, joint swelling, arthralgias and gait problem.  Skin: Negative for color change, pallor, rash and wound.  Neurological: Negative for dizziness, tremors, seizures, syncope, facial asymmetry, speech difficulty, weakness, light-headedness, numbness and headaches.  Hematological: Negative for adenopathy. Does not bruise/bleed easily.  Psychiatric/Behavioral: Negative.        Objective:   Physical Exam  Vitals reviewed. Constitutional: She is oriented to person, place, and time. She appears well-developed and well-nourished. No distress.  HENT:  Head: Normocephalic and atraumatic.  Mouth/Throat: Oropharynx is clear and moist. No oropharyngeal exudate.  Eyes: Conjunctivae are normal. Right eye exhibits no discharge. Left eye  exhibits no discharge. No scleral icterus.  Neck: Normal range of motion. Neck supple. No JVD present. No tracheal deviation present. No thyromegaly present.  Cardiovascular: Normal rate, regular rhythm, normal heart sounds and intact distal pulses.  Exam reveals no gallop and no friction rub.   No murmur heard. Pulmonary/Chest: Effort normal and breath sounds normal. No stridor. No respiratory distress. She has no wheezes. She has no rales. She exhibits no tenderness.  Abdominal: Soft. Bowel sounds are normal. She exhibits no distension and no mass. There is no tenderness. There is no rebound and no guarding.  Musculoskeletal: Normal range of motion. She exhibits no edema and no tenderness.  Lymphadenopathy:    She has no cervical adenopathy.  Neurological: She is oriented to person, place, and time.  Skin: Skin is warm and dry. No rash noted. She is not diaphoretic. No erythema. No pallor.  Psychiatric: She has a normal mood and affect. Her behavior is normal. Judgment and thought content normal.     Lab Results  Component Value Date   WBC 5.0 10/01/2011   HGB 15.0 10/01/2011   HCT 43.8 10/01/2011   PLT 164 10/01/2011   GLUCOSE 90 10/01/2011   CHOL  Value: 142        ATP III CLASSIFICATION:  <200     mg/dL   Desirable  409-811  mg/dL   Borderline High  >=914    mg/dL   High 7/82/9562   TRIG 103 05/15/2007   HDL 42 05/15/2007   LDLCALC  Value: 79        Total Cholesterol/HDL:CHD Risk Coronary Heart Disease Risk Table                     Men   Women  1/2 Average Risk   3.4   3.3 05/15/2007   ALT  22 11/04/2011   AST 23 11/04/2011   NA 142 10/01/2011   K 3.9 10/01/2011   CL 104 10/01/2011   CREATININE 0.89 10/01/2011   BUN 18 10/01/2011   CO2 28 10/01/2011   TSH 1.72 07/21/2011   HGBA1C  Value: 5.8 (NOTE)   The ADA recommends the following therapeutic goals for glycemic   control related to Hgb A1C measurement:   Goal of Therapy:   < 7.0% Hgb A1C   Action Suggested:  > 8.0% Hgb A1C   Ref:  Diabetes  Care, 22, Suppl. 1, 1999 05/15/2007       Assessment & Plan:

## 2012-02-10 NOTE — Assessment & Plan Note (Signed)
She has good rate and rhythm control 

## 2012-02-10 NOTE — Patient Instructions (Signed)

## 2012-02-10 NOTE — Assessment & Plan Note (Signed)
FL[facility] CMP TSH today 

## 2012-02-10 NOTE — Assessment & Plan Note (Signed)
Her BP is well controlled, I will check her lytes and renal function 

## 2012-02-11 DIAGNOSIS — M1710 Unilateral primary osteoarthritis, unspecified knee: Secondary | ICD-10-CM | POA: Diagnosis not present

## 2012-02-11 DIAGNOSIS — M25569 Pain in unspecified knee: Secondary | ICD-10-CM | POA: Diagnosis not present

## 2012-02-24 ENCOUNTER — Ambulatory Visit: Payer: Medicare Other | Admitting: Internal Medicine

## 2012-02-24 ENCOUNTER — Encounter: Payer: Self-pay | Admitting: Endocrinology

## 2012-02-24 ENCOUNTER — Ambulatory Visit (INDEPENDENT_AMBULATORY_CARE_PROVIDER_SITE_OTHER): Payer: Medicare Other | Admitting: Endocrinology

## 2012-02-24 ENCOUNTER — Telehealth: Payer: Self-pay | Admitting: Internal Medicine

## 2012-02-24 VITALS — BP 112/62 | HR 70 | Temp 98.7°F | Ht 66.0 in | Wt 188.0 lb

## 2012-02-24 DIAGNOSIS — J069 Acute upper respiratory infection, unspecified: Secondary | ICD-10-CM

## 2012-02-24 MED ORDER — CEFUROXIME AXETIL 250 MG PO TABS: 250.0000 mg | ORAL_TABLET | Freq: Two times a day (BID) | ORAL | Status: AC

## 2012-02-24 MED ORDER — PROMETHAZINE-CODEINE 6.25-10 MG/5ML PO SYRP: 5.0000 mL | ORAL_SOLUTION | ORAL | Status: AC | PRN

## 2012-02-24 NOTE — Telephone Encounter (Signed)
Caller: [place]/Patient; PCP: Sanda Linger; CB#: [phone];  Call regarding Cough/Congestion;  Afebrile/subjective.  Onset sx 6/22 w/ ST, Nonproductive Cough.  Worsening on 6/24.  Cough interferes with activity and sleep.  Advised see in 24 hours per Cough protocol.  Appointment with Dr. Jonny Ruiz at 14:15 as none available with PCP. Home care for the interim and parameters for callback given.

## 2012-02-24 NOTE — Progress Notes (Signed)
Subjective:    Patient ID: THANYA CEGIELSKI, female    DOB: 1933/07/13, 78 y.o.   MRN: [id number]  HPI Pt states few days of moderate dry-quality cough in the chest, and assoc sore throat.  She also has wheezing.   Past Medical History  Diagnosis Date  . Arthritis   . Hypertension   . Iron deficiency anemia, unspecified   . Osteoporosis   . Colon cancer   . Unspecified vascular insufficiency of intestine   . HLD (hyperlipidemia)   . Coronary atherosclerosis of unspecified type of vessel, native or graft   . Personal history of other diseases of circulatory system   . Other and unspecified angina pectoris   . Atrial fibrillation   . Ischemic colitis     Past Surgical History  Procedure Date  . Tubal ligation   . Appendectomy   . Sigmoid resection for invasive rectal adenocarcinoma 12/2006  . Abdominoperineal resection anastomotic stricture 05/2007  . Dilation and curettage of uterus 1992  . Rotator cuff repair 2006    left  . Mandibular renstruction   . Rt. salpingo oophorectomy and cyst removal 2005  . Wrist surgery 2008    right    History   Social History  . Marital Status: Widowed    Spouse Name: N/A    Number of Children: 4  . Years of Education: N/A   Occupational History  . retired    Social History Main Topics  . Smoking status: Never Smoker   . Smokeless tobacco: Never Used  . Alcohol Use: No  . Drug Use: No  . Sexually Active: Not Currently   Other Topics Concern  . Not on file   Social History Narrative  . No narrative on file    Current Outpatient Prescriptions on File Prior to Visit  Medication Sig Dispense Refill  . Calcium Carbonate-Vitamin D (CALCIUM 600 + D PO) Take 2 tablets by mouth daily.      . metoprolol (TOPROL-XL) 50 MG 24 hr tablet Take 1/2 tablet by mouth every morning and 1 tablet by mouth at bedtime      . metoprolol tartrate (LOPRESSOR) 25 MG tablet Take 25 mg by mouth as needed.       . Multiple Vitamin (MULTIVITAMIN PO)  Take 1 tablet by mouth daily.        . TEKTURNA HCT 300-25 MG TABS 1 tablet daily.       . ranitidine (ZANTAC) 150 MG tablet Take 1 tablet (150 mg total) by mouth daily.  30 tablet  1  . scopolamine (TRANSDERM-SCOP) 1.5 MG Place 1 patch (1.5 mg total) onto the skin every 3 (three) days.  10 patch  0    Allergies  Allergen Reactions  . Acetaminophen-Codeine   . Amlodipine Besylate     REACTION: swelling  . Diltiazem Hcl     REACTION: rash  . Hydrocodone-Acetaminophen     nausea  . Irbesartan-Hydrochlorothiazide     REACTION: Dizziness  . Lisinopril     cough  . Propoxyphene-Acetaminophen     Headache, and blotchy face  . Valsartan     REACTION: blurred vision  . Warfarin Sodium     REACTION: body aches and bleeding  . Verapamil Nausea Only, Palpitations and Other (See Comments)    Headaches     Family History  Problem Relation Age of Onset  . Colon cancer Father 33  . Heart disease Father     BP 112/62  Pulse 70  Temp 98.7 F (37.1 C) (Oral)  Ht 5\' 6"  (1.676 m)  Wt 188 lb (85.276 kg)  BMI 30.34 kg/m2  SpO2 95%    Review of Systems Denies fever, but she has myalgias and nausea.  Denies earache.    Objective:   Physical Exam VITAL SIGNS:  See vs page GENERAL: no distress head: no deformity eyes: no periorbital swelling, no proptosis external nose and ears are normal mouth: no lesion seen The right tm is red.  The left is normal LUNGS:  Clear to auscultation       Assessment & Plan:  Glenford Peers, new

## 2012-02-24 NOTE — Patient Instructions (Addendum)
here is a sample of "advair-100."  take 1 puff 2x a day.  rinse mouth after using. Here are 2 prescriptions:  Cough syrup and antibiotic. I hope you feel better soon.  If you don't feel better by next week, please call back.  Please call sooner if you feel worse.

## 2012-02-25 ENCOUNTER — Ambulatory Visit: Payer: Medicare Other | Admitting: Internal Medicine

## 2012-03-09 ENCOUNTER — Telehealth: Payer: Self-pay

## 2012-03-09 DIAGNOSIS — H9192 Unspecified hearing loss, left ear: Secondary | ICD-10-CM

## 2012-03-09 NOTE — Telephone Encounter (Signed)
Pt called requesting a referral for audiology testing to her current ENT. Please contact pt for MD's name.

## 2012-03-12 NOTE — Telephone Encounter (Signed)
done

## 2012-03-12 NOTE — Telephone Encounter (Signed)
Spoke with patient who states that Dr Maren Reamer(?) her current MD at [facility] need a referral in order to see pt for follow up regarding her ears. Thanks

## 2012-03-18 ENCOUNTER — Ambulatory Visit (HOSPITAL_BASED_OUTPATIENT_CLINIC_OR_DEPARTMENT_OTHER): Payer: Medicare Other | Admitting: Hematology & Oncology

## 2012-03-18 ENCOUNTER — Other Ambulatory Visit (HOSPITAL_BASED_OUTPATIENT_CLINIC_OR_DEPARTMENT_OTHER): Payer: Medicare Other | Admitting: Lab

## 2012-03-18 VITALS — BP 136/75 | HR 58 | Temp 97.4°F | Ht 66.0 in | Wt 160.0 lb

## 2012-03-18 DIAGNOSIS — C20 Malignant neoplasm of rectum: Secondary | ICD-10-CM

## 2012-03-18 LAB — COMPREHENSIVE METABOLIC PANEL
AST: 23 U/L (ref 0–37)
Albumin: 4.3 g/dL (ref 3.5–5.2)
BUN: 24 mg/dL — ABNORMAL HIGH (ref 6–23)
CO2: 29 mEq/L (ref 19–32)
Calcium: 9.7 mg/dL (ref 8.4–10.5)
Chloride: 105 mEq/L (ref 96–112)
Glucose, Bld: 87 mg/dL (ref 70–99)
Potassium: 3.9 mEq/L (ref 3.5–5.3)

## 2012-03-18 LAB — CBC WITH DIFFERENTIAL (CANCER CENTER ONLY)
BASO#: 0 10*3/uL (ref 0.0–0.2)
EOS%: 5.5 % (ref 0.0–7.0)
HGB: 15 g/dL (ref 11.6–15.9)
LYMPH#: 1.9 10*3/uL (ref 0.9–3.3)
MCHC: 34.3 g/dL (ref 32.0–36.0)
NEUT#: 2.4 10*3/uL (ref 1.5–6.5)
RBC: 4.84 10*6/uL (ref 3.70–5.32)
WBC: 5.1 10*3/uL (ref 3.9–10.0)

## 2012-03-18 NOTE — Progress Notes (Signed)
This office note has been dictated.

## 2012-03-19 NOTE — Progress Notes (Signed)
CC:   [name]. Juanda Chance, MD Everardo Beals Juanda Chance, MD, [facility] Naima A. Normand Sloop, M.D. Thomas A. Cornett, M.D.  DIAGNOSIS:  Stage I adenocarcinoma of the rectum.  CURRENT THERAPY:  Observation.  INTERIM HISTORY:  [name] comes in for her followup.  She is doing quite well.  She had a good time on her cruise back in March.  She enjoyed it quite a bit.  She made sure she did not eat too much.  She has had no problems with bowels or bladder.  There has been no change in her bowel habits.  She has had no bleeding.  She has had no fever, sweats or chills.  She has had no chest pain.  There has been no cough or shortness of breath. She does have atrial fibrillation.  She is well controlled off anticoagulation.  Her last CEA that we did on her was less than 0.5 back in January.  PHYSICAL EXAMINATION:  GENERAL:  This is a well-developed, well- nourished white female in no obvious distress.  Vital signs: Temperature 97.4, pulse 58, respiratory rate 18, blood pressure 136/75. Weight is 160.  Head and neck:  Normocephalic, atraumatic skull.  There are no ocular or oral lesions.  There are no palpable cervical or supraclavicular lymph nodes.  Lungs:  Clear bilaterally.  Cardiac: Regular rate and rhythm with a normal S1, S2.  There are no murmurs, rubs or bruits.  Abdomen:  Soft with good bowel sounds.  She has a well- healed laparotomy scar.  There is no fluid wave.  There is no palpable hepatosplenomegaly.  Back:  No tenderness over the spine, ribs, or hips. Extremities:  Shows no clubbing, cyanosis or edema.  Neurological: Shows no focal neurological deficits.  LABORATORY STUDIES:  White cell count is 5, hemoglobin 15, hematocrit 43.7, platelet count 158.  IMPRESSION:  [name] is a 78 year old white female with history of stage I adenocarcinoma of the rectum.  She underwent resection back in May 2008.  At this point in time, I think we can probably let her go from the office.  I think her  risk of recurrence is less than 5%.  I just do not think that we are going to be adding anything to her medical care.  I do not see that she needs any special lab work or x-ray studies done.  She had a colonoscopy 2 years ago.  Dr. Juanda Chance is managing this.  We will go ahead and plan to see [name] back if she needs [place].    ______________________________ Josph Macho, M.D. PRE/MEDQ  D:  03/18/2012  T:  03/19/2012  Job:  [id number]

## 2012-03-25 ENCOUNTER — Telehealth: Payer: Self-pay | Admitting: Oncology

## 2012-03-25 NOTE — Telephone Encounter (Addendum)
Message copied by Lacie Draft on Thu Mar 25, 2012  4:40 PM ------      Message from: Arlan Organ R      Created: Thu Mar 25, 2012  7:43 AM       Call - labs look great!!!!  Cindee Lame  03/25/2012 4:42 PM Left message on answering machine. Teola Bradley, Jaydn Moscato [employer]

## 2012-04-07 ENCOUNTER — Other Ambulatory Visit: Payer: Self-pay | Admitting: Internal Medicine

## 2012-04-07 DIAGNOSIS — Z1231 Encounter for screening mammogram for malignant neoplasm of breast: Secondary | ICD-10-CM

## 2012-05-06 ENCOUNTER — Ambulatory Visit
Admission: RE | Admit: 2012-05-06 | Discharge: 2012-05-06 | Disposition: A | Discharge status: Home / Self Care | Payer: Medicare Other | Source: Ambulatory Visit | Attending: Internal Medicine | Admitting: Internal Medicine

## 2012-05-06 DIAGNOSIS — Z1231 Encounter for screening mammogram for malignant neoplasm of breast: Secondary | ICD-10-CM | POA: Diagnosis not present

## 2012-06-08 ENCOUNTER — Ambulatory Visit: Payer: Medicare Other | Admitting: Internal Medicine

## 2012-07-09 DIAGNOSIS — E782 Mixed hyperlipidemia: Secondary | ICD-10-CM | POA: Diagnosis not present

## 2012-07-09 DIAGNOSIS — I119 Hypertensive heart disease without heart failure: Secondary | ICD-10-CM | POA: Diagnosis not present

## 2012-07-09 DIAGNOSIS — I4891 Unspecified atrial fibrillation: Secondary | ICD-10-CM | POA: Diagnosis not present

## 2012-08-09 DIAGNOSIS — M25569 Pain in unspecified knee: Secondary | ICD-10-CM | POA: Diagnosis not present

## 2012-10-07 ENCOUNTER — Encounter: Payer: Self-pay | Admitting: *Deleted

## 2012-11-17 ENCOUNTER — Encounter: Payer: Self-pay | Admitting: Internal Medicine

## 2012-11-17 ENCOUNTER — Ambulatory Visit (INDEPENDENT_AMBULATORY_CARE_PROVIDER_SITE_OTHER): Payer: Medicare Other | Admitting: Internal Medicine

## 2012-11-17 VITALS — BP 142/76 | HR 64 | Ht 66.0 in | Wt 197.6 lb

## 2012-11-17 DIAGNOSIS — Z85048 Personal history of other malignant neoplasm of rectum, rectosigmoid junction, and anus: Secondary | ICD-10-CM

## 2012-11-17 DIAGNOSIS — K222 Esophageal obstruction: Secondary | ICD-10-CM

## 2012-11-17 DIAGNOSIS — R1319 Other dysphagia: Secondary | ICD-10-CM | POA: Diagnosis not present

## 2012-11-17 MED ORDER — RANITIDINE HCL 150 MG PO TABS: 150.0000 mg | ORAL_TABLET | Freq: Every day | ORAL | Status: DC

## 2012-11-17 NOTE — Patient Instructions (Addendum)
We have sent the following medications to your pharmacy for you to pick up at your convenience: Zantac  We have given you a brochure regarding Hiatal hernia.  Gastroesophageal Reflux Disease, Adult Gastroesophageal reflux disease (GERD) happens when acid from your stomach flows up into the esophagus. When acid comes in contact with the esophagus, the acid causes soreness (inflammation) in the esophagus. Over time, GERD may create small holes (ulcers) in the lining of the esophagus. CAUSES   Increased body weight. This puts pressure on the stomach, making acid rise from the stomach into the esophagus.  Smoking. This increases acid production in the stomach.  Drinking alcohol. This causes decreased pressure in the lower esophageal sphincter (valve or ring of muscle between the esophagus and stomach), allowing acid from the stomach into the esophagus.  Late evening meals and a full stomach. This increases pressure and acid production in the stomach.  A malformed lower esophageal sphincter. Sometimes, no cause is found. SYMPTOMS   Burning pain in the lower part of the mid-chest behind the breastbone and in the mid-stomach area. This may occur twice a week or more often.  Trouble swallowing.  Sore throat.  Dry cough.  Asthma-like symptoms including chest tightness, shortness of breath, or wheezing. DIAGNOSIS  Your caregiver may be able to diagnose GERD based on your symptoms. In some cases, X-rays and other tests may be done to check for complications or to check the condition of your stomach and esophagus. TREATMENT  Your caregiver may recommend over-the-counter or prescription medicines to help decrease acid production. Ask your caregiver before starting or adding any new medicines.  HOME CARE INSTRUCTIONS   Change the factors that you can control. Ask your caregiver for guidance concerning weight loss, quitting smoking, and alcohol consumption.  Avoid foods and drinks that make  your symptoms worse, such as:  Caffeine or alcoholic drinks.  Chocolate.  Peppermint or mint flavorings.  Garlic and onions.  Spicy foods.  Citrus fruits, such as oranges, lemons, or limes.  Tomato-based foods such as sauce, chili, salsa, and pizza.  Fried and fatty foods.  Avoid lying down for the 3 hours prior to your bedtime or prior to taking a nap.  Eat small, frequent meals instead of large meals.  Wear loose-fitting clothing. Do not wear anything tight around your waist that causes pressure on your stomach.  Raise the head of your bed 6 to 8 inches with wood blocks to help you sleep. Extra pillows will not help.  Only take over-the-counter or prescription medicines for pain, discomfort, or fever as directed by your caregiver.  Do not take aspirin, ibuprofen, or other nonsteroidal anti-inflammatory drugs (NSAIDs). SEEK IMMEDIATE MEDICAL CARE IF:   You have pain in your arms, neck, jaw, teeth, or back.  Your pain increases or changes in intensity or duration.  You develop nausea, vomiting, or sweating (diaphoresis).  You develop shortness of breath, or you faint.  Your vomit is green, yellow, black, or looks like coffee grounds or blood.  Your stool is red, bloody, or black. These symptoms could be signs of other problems, such as heart disease, gastric bleeding, or esophageal bleeding. MAKE SURE YOU:   Understand these instructions.  Will watch your condition.  Will get help right away if you are not doing well or get worse. Document Released: 05/28/2005 Document Revised: 11/10/2011 Document Reviewed: 03/07/2011 [employer] Patient Information 2013 [employer], [place]. ________________________________________________________________________________________________________________  Gastroesophageal Reflux Disease, Adult Gastroesophageal reflux disease (GERD) happens when acid from your stomach  flows up into the esophagus. When acid comes in contact with the  esophagus, the acid causes soreness (inflammation) in the esophagus. Over time, GERD may create small holes (ulcers) in the lining of the esophagus. CAUSES   Increased body weight. This puts pressure on the stomach, making acid rise from the stomach into the esophagus.  Smoking. This increases acid production in the stomach.  Drinking alcohol. This causes decreased pressure in the lower esophageal sphincter (valve or ring of muscle between the esophagus and stomach), allowing acid from the stomach into the esophagus.  Late evening meals and a full stomach. This increases pressure and acid production in the stomach.  A malformed lower esophageal sphincter. Sometimes, no cause is found. SYMPTOMS   Burning pain in the lower part of the mid-chest behind the breastbone and in the mid-stomach area. This may occur twice a week or more often.  Trouble swallowing.  Sore throat.  Dry cough.  Asthma-like symptoms including chest tightness, shortness of breath, or wheezing. DIAGNOSIS  Your caregiver may be able to diagnose GERD based on your symptoms. In some cases, X-rays and other tests may be done to check for complications or to check the condition of your stomach and esophagus. TREATMENT  Your caregiver may recommend over-the-counter or prescription medicines to help decrease acid production. Ask your caregiver before starting or adding any new medicines.  HOME CARE INSTRUCTIONS   Change the factors that you can control. Ask your caregiver for guidance concerning weight loss, quitting smoking, and alcohol consumption.  Avoid foods and drinks that make your symptoms worse, such as:  Caffeine or alcoholic drinks.  Chocolate.  Peppermint or mint flavorings.  Garlic and onions.  Spicy foods.  Citrus fruits, such as oranges, lemons, or limes.  Tomato-based foods such as sauce, chili, salsa, and pizza.  Fried and fatty foods.  Avoid lying down for the 3 hours prior to your  bedtime or prior to taking a nap.  Eat small, frequent meals instead of large meals.  Wear loose-fitting clothing. Do not wear anything tight around your waist that causes pressure on your stomach.  Raise the head of your bed 6 to 8 inches with wood blocks to help you sleep. Extra pillows will not help.  Only take over-the-counter or prescription medicines for pain, discomfort, or fever as directed by your caregiver.  Do not take aspirin, ibuprofen, or other nonsteroidal anti-inflammatory drugs (NSAIDs). SEEK IMMEDIATE MEDICAL CARE IF:   You have pain in your arms, neck, jaw, teeth, or back.  Your pain increases or changes in intensity or duration.  You develop nausea, vomiting, or sweating (diaphoresis).  You develop shortness of breath, or you faint.  Your vomit is green, yellow, black, or looks like coffee grounds or blood.  Your stool is red, bloody, or black. These symptoms could be signs of other problems, such as heart disease, gastric bleeding, or esophageal bleeding. MAKE SURE YOU:   Understand these instructions.  Will watch your condition.  Will get help right away if you are not doing well or get worse. Document Released: 05/28/2005 Document Revised: 11/10/2011 Document Reviewed: 03/07/2011 [facility] Patient Information 2013 [place], [place].  CC: Dr Sanda Linger

## 2012-11-17 NOTE — Progress Notes (Signed)
[name] 02-14-1933 MRN [id number]   History of Present Illness:  This is a 79 year old white female with a history of sigmoid carcinoma resected in 2008. Her last colonoscopy was in October 2012. She is now complaining of solid food dysphagia. She mentioned something to me about it during her last appointment in March 2013 but her symptoms were not severe enough at the time. She now has occasional regurgitation of food but denies any heartburn or nocturnal cough. We have treated her with Zantac 150 mg at bedtime but she ran out of the medication and did not ask for a refill. An upper abdominal ultrasound in March 2013 showed fatty liver and normal gallbladder. She had a fatty liver on a CT scan of the abdomen in January 2011. She does not smoke. She does not take any anti-inflammatory medications.   Past Medical History  Diagnosis Date  . Arthritis   . Hypertension   . Iron deficiency anemia, unspecified   . Osteoporosis   . Colon cancer   . Unspecified vascular insufficiency of intestine   . HLD (hyperlipidemia)   . Coronary atherosclerosis of unspecified type of vessel, native or graft   . Personal history of other diseases of circulatory system   . Other and unspecified angina pectoris   . Atrial fibrillation   . Ischemic colitis   . Fatty liver 11/05/11   Past Surgical History  Procedure Laterality Date  . Tubal ligation    . Appendectomy    . Sigmoid resection for invasive rectal adenocarcinoma  12/2006  . Abdominoperineal resection anastomotic stricture  05/2007  . Dilation and curettage of uterus  1992  . Rotator cuff repair  2006    left  . Mandibular renstruction    . Rt. salpingo oophorectomy and cyst removal  2005  . Wrist surgery  2008    right    reports that she has never smoked. She has never used smokeless tobacco. She reports that she does not drink alcohol or use illicit drugs. family history includes Colon cancer (age of onset: 4) in her father and Heart  disease in her father. Allergies  Allergen Reactions  . Acetaminophen-Codeine   . Amlodipine Besylate     REACTION: swelling  . Diltiazem Hcl     REACTION: rash  . Hydrocodone-Acetaminophen     nausea  . Irbesartan-Hydrochlorothiazide     REACTION: Dizziness  . Lisinopril     cough  . Propoxyphene-Acetaminophen     Headache, and blotchy face  . Valsartan     REACTION: blurred vision  . Warfarin Sodium     REACTION: body aches and bleeding  . Verapamil Nausea Only, Palpitations and Other (See Comments)    Headaches         Review of Systems: Positive for dysphagia to solids  The remainder of the 10 point ROS is negative except as outlined in H&P   Physical Exam: General appearance  Well developed, in no distress. Psychological normal mood and affect.  Assessment and Plan:  Problem #16 79 year old white female with solid food dysphagia consistent with benign distal esophageal stricture. She is reluctant at this point is to go through an upper endoscopy and esophageal dilatation and would like to try Zantac 150 mg at bedtime. We have discussed extensively antireflux measures. She will let us know when she needs endoscopy and dilatation. She will follow strict antireflux measures.   Patient will be due for her recall colonoscopy in 2017.  11/17/2012 Lina Sar

## 2012-12-13 ENCOUNTER — Telehealth: Payer: Self-pay | Admitting: Internal Medicine

## 2012-12-13 DIAGNOSIS — R1310 Dysphagia, unspecified: Secondary | ICD-10-CM

## 2012-12-13 NOTE — Telephone Encounter (Signed)
Spoke with patient and Zantac helped at first but not helping now. She is asking for EGD with dil. Please, advise.

## 2012-12-13 NOTE — Telephone Encounter (Signed)
Please schedule EGD/dil in [facility]

## 2012-12-14 NOTE — Telephone Encounter (Signed)
Scheduled EGD with dil on 12/24/12 at 4:00 PM and pre visit on 12/22/12 at 8:00 AM.

## 2012-12-14 NOTE — Telephone Encounter (Signed)
Left a message for patient to call me. 

## 2012-12-20 DIAGNOSIS — M1710 Unilateral primary osteoarthritis, unspecified knee: Secondary | ICD-10-CM | POA: Diagnosis not present

## 2012-12-22 ENCOUNTER — Ambulatory Visit (AMBULATORY_SURGERY_CENTER): Payer: Medicare Other | Admitting: *Deleted

## 2012-12-22 VITALS — Ht 66.0 in | Wt 198.0 lb

## 2012-12-22 DIAGNOSIS — R1319 Other dysphagia: Secondary | ICD-10-CM

## 2012-12-22 DIAGNOSIS — K222 Esophageal obstruction: Secondary | ICD-10-CM

## 2012-12-22 NOTE — Progress Notes (Signed)
Denies allergies to eggs or soy products. Denies any complications with sedation or anesthesia. 

## 2012-12-24 ENCOUNTER — Ambulatory Visit (AMBULATORY_SURGERY_CENTER): Payer: Medicare Other | Admitting: Internal Medicine

## 2012-12-24 ENCOUNTER — Encounter: Payer: Self-pay | Admitting: Internal Medicine

## 2012-12-24 VITALS — BP 155/78 | HR 57 | Temp 97.1°F | Resp 22 | Ht 66.0 in | Wt 198.0 lb

## 2012-12-24 DIAGNOSIS — R1319 Other dysphagia: Secondary | ICD-10-CM | POA: Diagnosis not present

## 2012-12-24 DIAGNOSIS — I10 Essential (primary) hypertension: Secondary | ICD-10-CM | POA: Diagnosis not present

## 2012-12-24 DIAGNOSIS — I2510 Atherosclerotic heart disease of native coronary artery without angina pectoris: Secondary | ICD-10-CM | POA: Diagnosis not present

## 2012-12-24 DIAGNOSIS — K222 Esophageal obstruction: Secondary | ICD-10-CM | POA: Diagnosis not present

## 2012-12-24 MED ORDER — SODIUM CHLORIDE 0.9 % IV SOLN: 500.0000 mL | INTRAVENOUS | Status: DC

## 2012-12-24 NOTE — Progress Notes (Signed)
Patient did not experience any of the following events: a burn prior to discharge; a fall within the facility; wrong site/side/patient/procedure/implant event; or a hospital transfer or hospital admission upon discharge from the facility. (G8907) Patient did not have preoperative order for IV antibiotic SSI prophylaxis. (G8918)  

## 2012-12-24 NOTE — Op Note (Signed)
[facility] [address] [place], [address]   ENDOSCOPY PROCEDURE REPORT  PATIENT: Holly, Turner  MR#: [id number] BIRTHDATE: October 21, 1932 , 80  yrs. old GENDER: Female ENDOSCOPIST: Hart Carwin, MD REFERRED BY:  Dr Sanda Linger PROCEDURE DATE:  12/24/2012 PROCEDURE:  EGD, diagnostic and Savary dilation of esophagus ASA CLASS:     Class II INDICATIONS:  Dysphagia. , solid food, slightly improved on a PPI MEDICATIONS: MAC sedation, administered by CRNA and propofol (Diprivan) 200mg  IV TOPICAL ANESTHETIC: Cetacaine Spray  DESCRIPTION OF PROCEDURE: After the risks benefits and alternatives of the procedure were thoroughly explained, informed consent was obtained.  The LB GIF-H180 [id number] endoscope was introduced through the mouth and advanced to the second portion of the duodenum. Without limitations.  The instrument was slowly withdrawn as the mucosa was fully examined.      Esophagus: endoscope passed under direct vision through  posterior pharynx into esophagus. Esophageal mucosa appeared normal. There were no erosions. At the level of 35 cm from the incisors was a benign appearing fibrotic concentric  stricture which allowed the endoscope to traverse into the stomach.. The diameter of the stricture was about 14 mm. There was no bleeding.  Stomach the stomach was insufflated with air and show normal gastric folds, gastric antrum and pyloric outlet. Retroflexion of the endoscope revealed normal fundus and cardia  Duodenum duodenal bulb and descending duodenum was normal  The endoscope was then brought back into the stomach and the guidewire was placed without fluoroscopic guidance. Savary dilators 14, 15 and 16 mm times over the guidewire without resistance. There was no blood on the dilator patient tolerated procedure well[ The scope was then withdrawn from the patient and the procedure completed.  COMPLICATIONS: There were no  complications. ENDOSCOPIC IMPRESSION:  mild benign distal esophageal stricture. Status post dilatation with Savary dilators to 16 mm 3 cm hiatal hernia RECOMMENDATIONS: 1.  Anti-reflux regimen to be follow 2.  Continue PPI  REPEAT EXAM: as needed  eSigned:  Hart Carwin, MD 12/24/2012 4:51 PM   CC:  PATIENT NAME:  Holly, Turner MR#: [id number]

## 2012-12-24 NOTE — Patient Instructions (Addendum)
Discharge instructions given with verbal understanding. Handouts on a hiatal hernia, and a dilatation diet given. Resume previous medications. YOU HAD AN ENDOSCOPIC PROCEDURE TODAY AT THE [facility]: Refer to the procedure report that was given to you for any specific questions about what was found during the examination.  If the procedure report does not answer your questions, please call your gastroenterologist to clarify.  If you requested that your care partner not be given the details of your procedure findings, then the procedure report has been included in a sealed envelope for you to review at your convenience later.  YOU SHOULD EXPECT: Some feelings of bloating in the abdomen. Passage of more gas than usual.  Walking can help get rid of the air that was put into your GI tract during the procedure and reduce the bloating. If you had a lower endoscopy (such as a colonoscopy or flexible sigmoidoscopy) you may notice spotting of blood in your stool or on the toilet paper. If you underwent a bowel prep for your procedure, then you may not have a normal bowel movement for a few days.  DIET: Your first meal following the procedure should be a light meal and then it is ok to progress to your normal diet.  A half-sandwich or bowl of soup is an example of a good first meal.  Heavy or fried foods are harder to digest and may make you feel nauseous or bloated.  Likewise meals heavy in dairy and vegetables can cause extra gas to form and this can also increase the bloating.  Drink plenty of fluids but you should avoid alcoholic beverages for 24 hours.  ACTIVITY: Your care partner should take you home directly after the procedure.  You should plan to take it easy, moving slowly for the rest of the day.  You can resume normal activity the day after the procedure however you should NOT DRIVE or use heavy machinery for 24 hours (because of the sedation medicines used during the test).    SYMPTOMS TO  REPORT IMMEDIATELY: A gastroenterologist can be reached at any hour.  During normal business hours, 8:30 AM to 5:00 PM Monday through Friday, call [phone].  After hours and on weekends, please call the GI answering service at [phone] who will take a message and have the physician on call contact you.   Following upper endoscopy (EGD)  Vomiting of blood or coffee ground material  New chest pain or pain under the shoulder blades  Painful or persistently difficult swallowing  New shortness of breath  Fever of 100F or higher  Black, tarry-looking stools  FOLLOW UP: If any biopsies were taken you will be contacted by phone or by letter within the next 1-3 weeks.  Call your gastroenterologist if you have not heard about the biopsies in 3 weeks.  Our staff will call the home number listed on your records the next business day following your procedure to check on you and address any questions or concerns that you may have at that time regarding the information given to you following your procedure. This is a courtesy call and so if there is no answer at the home number and we have not heard from you through the emergency physician on call, we will assume that you have returned to your regular daily activities without incident.  SIGNATURES/CONFIDENTIALITY: You and/or your care partner have signed paperwork which will be entered into your electronic medical record.  These signatures attest to the fact  that that the information above on your After Visit Summary has been reviewed and is understood.  Full responsibility of the confidentiality of this discharge information lies with you and/or your care-partner.

## 2012-12-24 NOTE — Progress Notes (Signed)
Called to room to assist during endoscopic procedure.  Patient ID and intended procedure confirmed with present staff. Received instructions for my participation in the procedure from the performing physician. ewm 

## 2012-12-27 ENCOUNTER — Telehealth: Payer: Self-pay | Admitting: *Deleted

## 2012-12-27 DIAGNOSIS — M1710 Unilateral primary osteoarthritis, unspecified knee: Secondary | ICD-10-CM | POA: Diagnosis not present

## 2012-12-27 NOTE — Telephone Encounter (Signed)
  Follow up Call-  Call back number 12/24/2012 06/02/2011  Post procedure Call Back phone  # [phone]  Permission to leave phone message Yes -     Patient questions:  Do you have a fever, pain , or abdominal swelling? no Pain Score  0 *  Have you tolerated food without any problems? yes  Have you been able to return to your normal activities? yes  Do you have any questions about your discharge instructions: Diet   no Medications  no Follow up visit  no  Do you have questions or concerns about your Care? no  Actions: * If pain score is 4 or above: No action needed, pain <4.

## 2013-01-03 DIAGNOSIS — M1710 Unilateral primary osteoarthritis, unspecified knee: Secondary | ICD-10-CM | POA: Diagnosis not present

## 2013-02-05 DIAGNOSIS — M1710 Unilateral primary osteoarthritis, unspecified knee: Secondary | ICD-10-CM | POA: Diagnosis not present

## 2013-02-14 DIAGNOSIS — IMO0002 Reserved for concepts with insufficient information to code with codable children: Secondary | ICD-10-CM | POA: Diagnosis not present

## 2013-02-15 ENCOUNTER — Telehealth: Payer: Self-pay | Admitting: Cardiovascular Disease

## 2013-02-15 NOTE — Telephone Encounter (Signed)
Patient states that Dr. Thurston Hole wants to do knee surgery---please ok this ASAP.

## 2013-02-15 NOTE — Telephone Encounter (Signed)
Message forwarded to Dr. Tresa Endo.  Paper chart w/ form on Dr. Landry Dyke cart for review.

## 2013-02-16 ENCOUNTER — Telehealth: Payer: Self-pay | Admitting: *Deleted

## 2013-02-16 NOTE — Telephone Encounter (Signed)
Faxed signed surgical clearance for pending right knee arthroscopy.

## 2013-03-09 DIAGNOSIS — IMO0002 Reserved for concepts with insufficient information to code with codable children: Secondary | ICD-10-CM | POA: Diagnosis not present

## 2013-03-09 DIAGNOSIS — M23305 Other meniscus derangements, unspecified medial meniscus, unspecified knee: Secondary | ICD-10-CM | POA: Diagnosis not present

## 2013-03-09 DIAGNOSIS — M659 Synovitis and tenosynovitis, unspecified: Secondary | ICD-10-CM | POA: Diagnosis not present

## 2013-03-09 DIAGNOSIS — S83289A Other tear of lateral meniscus, current injury, unspecified knee, initial encounter: Secondary | ICD-10-CM | POA: Diagnosis not present

## 2013-03-09 DIAGNOSIS — M2240 Chondromalacia patellae, unspecified knee: Secondary | ICD-10-CM | POA: Diagnosis not present

## 2013-03-09 DIAGNOSIS — M8920 Other disorders of bone development and growth, unspecified site: Secondary | ICD-10-CM | POA: Diagnosis not present

## 2013-03-09 DIAGNOSIS — G8918 Other acute postprocedural pain: Secondary | ICD-10-CM | POA: Diagnosis not present

## 2013-03-09 DIAGNOSIS — M23302 Other meniscus derangements, unspecified lateral meniscus, unspecified knee: Secondary | ICD-10-CM | POA: Diagnosis not present

## 2013-03-21 DIAGNOSIS — R262 Difficulty in walking, not elsewhere classified: Secondary | ICD-10-CM | POA: Diagnosis not present

## 2013-03-21 DIAGNOSIS — IMO0002 Reserved for concepts with insufficient information to code with codable children: Secondary | ICD-10-CM | POA: Diagnosis not present

## 2013-03-21 DIAGNOSIS — S83289A Other tear of lateral meniscus, current injury, unspecified knee, initial encounter: Secondary | ICD-10-CM | POA: Diagnosis not present

## 2013-03-21 DIAGNOSIS — Z4789 Encounter for other orthopedic aftercare: Secondary | ICD-10-CM | POA: Diagnosis not present

## 2013-03-22 DIAGNOSIS — Z4789 Encounter for other orthopedic aftercare: Secondary | ICD-10-CM | POA: Diagnosis not present

## 2013-03-22 DIAGNOSIS — S83289A Other tear of lateral meniscus, current injury, unspecified knee, initial encounter: Secondary | ICD-10-CM | POA: Diagnosis not present

## 2013-03-22 DIAGNOSIS — IMO0002 Reserved for concepts with insufficient information to code with codable children: Secondary | ICD-10-CM | POA: Diagnosis not present

## 2013-03-22 DIAGNOSIS — R262 Difficulty in walking, not elsewhere classified: Secondary | ICD-10-CM | POA: Diagnosis not present

## 2013-03-24 DIAGNOSIS — R262 Difficulty in walking, not elsewhere classified: Secondary | ICD-10-CM | POA: Diagnosis not present

## 2013-03-24 DIAGNOSIS — Z4789 Encounter for other orthopedic aftercare: Secondary | ICD-10-CM | POA: Diagnosis not present

## 2013-03-24 DIAGNOSIS — S83289A Other tear of lateral meniscus, current injury, unspecified knee, initial encounter: Secondary | ICD-10-CM | POA: Diagnosis not present

## 2013-03-24 DIAGNOSIS — IMO0002 Reserved for concepts with insufficient information to code with codable children: Secondary | ICD-10-CM | POA: Diagnosis not present

## 2013-03-29 DIAGNOSIS — R262 Difficulty in walking, not elsewhere classified: Secondary | ICD-10-CM | POA: Diagnosis not present

## 2013-03-29 DIAGNOSIS — S83289A Other tear of lateral meniscus, current injury, unspecified knee, initial encounter: Secondary | ICD-10-CM | POA: Diagnosis not present

## 2013-03-29 DIAGNOSIS — IMO0002 Reserved for concepts with insufficient information to code with codable children: Secondary | ICD-10-CM | POA: Diagnosis not present

## 2013-03-31 DIAGNOSIS — S83289A Other tear of lateral meniscus, current injury, unspecified knee, initial encounter: Secondary | ICD-10-CM | POA: Diagnosis not present

## 2013-03-31 DIAGNOSIS — IMO0002 Reserved for concepts with insufficient information to code with codable children: Secondary | ICD-10-CM | POA: Diagnosis not present

## 2013-03-31 DIAGNOSIS — R262 Difficulty in walking, not elsewhere classified: Secondary | ICD-10-CM | POA: Diagnosis not present

## 2013-04-04 DIAGNOSIS — H2510 Age-related nuclear cataract, unspecified eye: Secondary | ICD-10-CM | POA: Diagnosis not present

## 2013-04-04 DIAGNOSIS — H5200 Hypermetropia, unspecified eye: Secondary | ICD-10-CM | POA: Diagnosis not present

## 2013-04-05 DIAGNOSIS — S83289A Other tear of lateral meniscus, current injury, unspecified knee, initial encounter: Secondary | ICD-10-CM | POA: Diagnosis not present

## 2013-04-05 DIAGNOSIS — IMO0002 Reserved for concepts with insufficient information to code with codable children: Secondary | ICD-10-CM | POA: Diagnosis not present

## 2013-04-05 DIAGNOSIS — R262 Difficulty in walking, not elsewhere classified: Secondary | ICD-10-CM | POA: Diagnosis not present

## 2013-05-06 ENCOUNTER — Other Ambulatory Visit: Payer: Self-pay | Admitting: *Deleted

## 2013-05-06 MED ORDER — RANITIDINE HCL 150 MG PO TABS: 150.0000 mg | ORAL_TABLET | Freq: Every day | ORAL | Status: DC

## 2013-05-23 ENCOUNTER — Other Ambulatory Visit: Payer: Self-pay

## 2013-05-23 DIAGNOSIS — Z1231 Encounter for screening mammogram for malignant neoplasm of breast: Secondary | ICD-10-CM

## 2013-06-13 ENCOUNTER — Ambulatory Visit: Payer: Medicare Other

## 2013-06-14 ENCOUNTER — Ambulatory Visit
Admission: RE | Admit: 2013-06-14 | Discharge: 2013-06-14 | Disposition: A | Discharge status: Home / Self Care | Payer: Medicare Other | Source: Ambulatory Visit

## 2013-06-14 DIAGNOSIS — Z1231 Encounter for screening mammogram for malignant neoplasm of breast: Secondary | ICD-10-CM

## 2013-06-20 ENCOUNTER — Other Ambulatory Visit: Payer: Self-pay | Admitting: Internal Medicine

## 2013-06-20 DIAGNOSIS — R928 Other abnormal and inconclusive findings on diagnostic imaging of breast: Secondary | ICD-10-CM

## 2013-07-04 ENCOUNTER — Ambulatory Visit
Admission: RE | Admit: 2013-07-04 | Discharge: 2013-07-04 | Disposition: A | Discharge status: Home / Self Care | Payer: Medicare Other | Source: Ambulatory Visit | Attending: Internal Medicine | Admitting: Internal Medicine

## 2013-07-04 DIAGNOSIS — R928 Other abnormal and inconclusive findings on diagnostic imaging of breast: Secondary | ICD-10-CM

## 2013-07-09 ENCOUNTER — Other Ambulatory Visit: Payer: Self-pay | Admitting: Cardiovascular Disease

## 2013-07-11 NOTE — Telephone Encounter (Signed)
Rx was sent to pharmacy electronically. 

## 2013-07-14 ENCOUNTER — Encounter: Payer: Self-pay | Admitting: Cardiovascular Disease

## 2013-07-14 ENCOUNTER — Ambulatory Visit (INDEPENDENT_AMBULATORY_CARE_PROVIDER_SITE_OTHER): Payer: Medicare Other | Admitting: Cardiovascular Disease

## 2013-07-14 ENCOUNTER — Ambulatory Visit: Payer: Medicare Other | Admitting: Cardiovascular Disease

## 2013-07-14 VITALS — BP 140/80 | HR 63 | Ht 66.0 in | Wt 198.1 lb

## 2013-07-14 DIAGNOSIS — I4891 Unspecified atrial fibrillation: Secondary | ICD-10-CM

## 2013-07-14 DIAGNOSIS — E7800 Pure hypercholesterolemia, unspecified: Secondary | ICD-10-CM | POA: Diagnosis not present

## 2013-07-14 DIAGNOSIS — I119 Hypertensive heart disease without heart failure: Secondary | ICD-10-CM

## 2013-07-14 DIAGNOSIS — I10 Essential (primary) hypertension: Secondary | ICD-10-CM | POA: Diagnosis not present

## 2013-07-14 DIAGNOSIS — I480 Paroxysmal atrial fibrillation: Secondary | ICD-10-CM

## 2013-07-14 NOTE — Patient Instructions (Signed)
Your physician recommends that you return for lab work fasting. Your physician recommends that you schedule a follow-up appointment in: 1 YEAR. No changes were made today

## 2013-07-31 ENCOUNTER — Encounter: Payer: Self-pay | Admitting: Cardiovascular Disease

## 2013-07-31 NOTE — Progress Notes (Signed)
Patient ID: [name], female   DOB: [birth year], 79 y.o.   MRN: [id number]     HPI: [name] is a 79 y.o. female who presents for one-year cardiology evaluation.  [name] is a 79 year old female who has a history of paroxysmal atrial fibrillation, hypertension, as well as hyperlipidemia. Remotely, she developed myalgias with simvastatin and has not been willing to try additional lipid lowering therapy. In August 2012 oh Doppler study showed mild asymmetric LVH with proximal septal thickening without LVOT obstruction. She had grade 1 diastolic dysfunction with normal systolic function, mild MR, mild TR, and aortic valve sclerosis with mild MR.  Over the past year, she has continued to do well. She denies breakthrough atrial fibrillation. She denies chest pain. She remains active. She presents for evaluation.  Past Medical History  Diagnosis Date  . Arthritis   . Hypertension   . Iron deficiency anemia, unspecified   . Osteoporosis   . Colon cancer   . Unspecified vascular insufficiency of intestine   . HLD (hyperlipidemia)   . Coronary atherosclerosis of unspecified type of vessel, native or graft   . Personal history of other diseases of circulatory system   . Other and unspecified angina pectoris   . Atrial fibrillation   . Ischemic colitis   . Fatty liver 11/05/11    Past Surgical History  Procedure Laterality Date  . Tubal ligation    . Appendectomy    . Sigmoid resection for invasive rectal adenocarcinoma  12/2006  . Abdominoperineal resection anastomotic stricture  05/2007  . Dilation and curettage of uterus  1992  . Rotator cuff repair  2006    left  . Mandibular renstruction    . Rt. salpingo oophorectomy and cyst removal  2005  . Wrist surgery  2008    right    Allergies  Allergen Reactions  . Acetaminophen-Codeine   . Amlodipine Besylate     REACTION: swelling  . Diltiazem Hcl     REACTION: rash  . Hydrocodone-Acetaminophen     nausea  .  Irbesartan-Hydrochlorothiazide     REACTION: Dizziness  . Lisinopril     cough  . Propoxyphene-Acetaminophen     Headache, and blotchy face  . Valsartan     REACTION: blurred vision  . Warfarin Sodium     REACTION: body aches and bleeding  . Verapamil Nausea Only, Palpitations and Other (See Comments)    Headaches     Current Outpatient Prescriptions  Medication Sig Dispense Refill  . Calcium Carbonate-Vitamin D (CALCIUM 600 + D PO) Take 2 tablets by mouth daily.      . metoprolol (TOPROL-XL) 50 MG 24 hr tablet Take 1/2 tablet by mouth every morning and 1 tablet by mouth at bedtime      . metoprolol tartrate (LOPRESSOR) 25 MG tablet Take 25 mg by mouth as needed.       . Multiple Vitamin (MULTIVITAMIN PO) Take 1 tablet by mouth daily.        . Probiotic Product (ALIGN PO) Take 1 tablet by mouth daily.      . ranitidine (ZANTAC) 150 MG tablet Take 1 tablet (150 mg total) by mouth at bedtime.  90 tablet  1  . TEKTURNA HCT 300-25 MG TABS TAKE 1 TABLET BY MOUTH ONCE DAILY  30 each  0   No current facility-administered medications for this visit.    History   Social History  . Marital Status: Widowed    Spouse Name: N/A  Number of Children: 4  . Years of Education: N/A   Occupational History  . retired    Social History Main Topics  . Smoking status: Never Smoker   . Smokeless tobacco: Never Used  . Alcohol Use: No  . Drug Use: No  . Sexual Activity: Not Currently   Other Topics Concern  . Not on file   Social History Narrative  . No narrative on file    Family History  Problem Relation Age of Onset  . Colon cancer Father 76  . Heart disease Father   . Esophageal cancer Neg Hx   . Rectal cancer Neg Hx   . Stomach cancer Neg Hx    Social he is notable that she is widowed. She has 3 children and 9 grandchildren. She remains active. There is no alcohol tobacco use.  ROS is negative for fevers, chills or night sweats. She denies skin rash. She denies visual  changes. She denies changes in hearing. There is no lymphadenopathy. She denies PND or orthopnea. She denies cough or increased sputum production. She denies syncope or syncope. He is unaware of recurrent tachycardia arrhythmias. She denies chest pain. He denies nausea vomiting or diarrhea. She denies blood in her stool or urine. She denies myalgias. She denies claudication. She denies tremors. She denies neurologic symptoms. There is no diabetes. She denies cold or heat intolerance.   Other comprehensive 12 point system review is negative.  PE BP 140/80  Pulse 63  Ht 5\' 6"  (1.676 m)  Wt 198 lb 1.6 oz (89.858 kg)  BMI 31.99 kg/m2  Repeat blood pressure by me was 136/80. General: Alert, oriented, no distress.  Skin: normal turgor, no rashes HEENT: Normocephalic, atraumatic. Pupils round and reactive; sclera anicteric;no lid lag.  Nose without nasal septal hypertrophy Mouth/Parynx benign; Mallinpatti scale 2 Neck: No JVD, no carotid briuts Lungs: clear to ausculatation and percussion; no wheezing or rales Heart: RRR, s1 s2 normal 1/6 systolic murmur, with a faint aortic insufficiency murmur. Abdomen: soft, nontender; no hepatosplenomehaly, BS+; abdominal aorta nontender and not dilated by palpation. Pulses 2+ Extremities: no clubbing cyanosis or edema, Homan's sign negative  Neurologic: grossly nonfocal Psychologic: normal affect and mood.  ECG: Sinus rhythm at 63 beats per minute. Normal intervals.  LABS:  BMET    Component Value Date/Time   NA 143 03/18/2012 1010   NA 143 09/26/2009 1013   K 3.9 03/18/2012 1010   K 3.9 09/26/2009 1013   CL 105 03/18/2012 1010   CL 108 09/26/2009 1013   CO2 29 03/18/2012 1010   CO2 30 09/26/2009 1013   GLUCOSE 87 03/18/2012 1010   GLUCOSE 103 09/26/2009 1013   BUN 24* 03/18/2012 1010   BUN 18 09/26/2009 1013   CREATININE 0.89 03/18/2012 1010   CREATININE 0.9 09/26/2009 1013   CALCIUM 9.7 03/18/2012 1010   CALCIUM 10.0 09/26/2009 1013   GFRNONAA >60  05/20/2007 0413   GFRAA  Value: >60        The eGFR has been calculated using the MDRD equation. This calculation has not been validated in all clinical 05/20/2007 0413     Hepatic Function Panel     Component Value Date/Time   PROT 6.7 03/18/2012 1010   PROT 7.6 09/26/2009 1013   ALBUMIN 4.3 03/18/2012 1010   AST 23 03/18/2012 1010   AST 26 09/26/2009 1013   ALT 18 03/18/2012 1010   ALT 24 09/26/2009 1013   ALKPHOS 52 03/18/2012 1010   ALKPHOS 66  09/26/2009 1013   BILITOT 0.9 03/18/2012 1010   BILITOT 0.90 09/26/2009 1013   BILIDIR 0.1 11/04/2011 1434     CBC    Component Value Date/Time   WBC 5.1 03/18/2012 1010   WBC 4.6 07/21/2011 0952   WBC 5.5 10/09/2008 0817   RBC 4.78 07/21/2011 0952   RBC 4.89 10/09/2008 0817   HGB 15.0 03/18/2012 1010   HGB 14.6 07/21/2011 0952   HGB 14.9 10/09/2008 0817   HCT 43.7 03/18/2012 1010   HCT 43.9 07/21/2011 0952   HCT 43.0 10/09/2008 0817   PLT 158 03/18/2012 1010   PLT 187.0 07/21/2011 0952   PLT 221 10/09/2008 0817   MCV 90 03/18/2012 1010   MCV 92.0 07/21/2011 0952   MCV 88.0 10/09/2008 0817   MCH 31.0 03/18/2012 1010   MCH 30.5 10/09/2008 0817   MCHC 34.3 03/18/2012 1010   MCHC 33.3 07/21/2011 0952   MCHC 34.7 10/09/2008 0817   RDW 13.2 03/18/2012 1010   RDW 13.7 07/21/2011 0952   RDW 12.7 10/09/2008 0817   LYMPHSABS 1.9 03/18/2012 1010   LYMPHSABS 1.6 07/21/2011 0952   LYMPHSABS 1.9 10/09/2008 0817   MONOABS 0.5 07/21/2011 0952   MONOABS 0.5 10/09/2008 0817   EOSABS 0.3 03/18/2012 1010   EOSABS 0.2 07/21/2011 0952   EOSABS 0.1 10/09/2008 0817   BASOSABS 0.0 03/18/2012 1010   BASOSABS 0.0 07/21/2011 0952   BASOSABS 0.0 10/09/2008 0817     BNP No results found for this basename: probnp    Lipid Panel     Component Value Date/Time   CHOL 220* 02/10/2012 1010   TRIG 153.0* 02/10/2012 1010   HDL 54.60 02/10/2012 1010   CHOLHDL 4 02/10/2012 1010   VLDL 30.6 02/10/2012 1010   LDLCALC  Value: 79        Total Cholesterol/HDL:CHD Risk Coronary Heart Disease Risk  Table                     Men   Women  1/2 Average Risk   3.4   3.3 05/15/2007 0800     RADIOLOGY: US Breast Right  07/04/2013   CLINICAL DATA:  Abnormal screening right mammogram.  EXAM: DIGITAL DIAGNOSTIC  right MAMMOGRAM  ULTRASOUND right BREAST  COMPARISON:  With prior exams.  ACR Breast Density Category b: There are scattered areas of fibroglandular density.  FINDINGS: Spot compression views of the lateral aspect of the right breast were performed. There is persistence of a 4 mm low-density nodule in the posterior 3rd of the breast. There are no malignant type microcalcifications.  On physical exam I do not palpate a mass in the right breast.  Ultrasound is performed, showing there is a near anechoic lesion in the right breast at 7 o'clock 8 cm from the nipple measuring 3 x 2 x 3 mm.  IMPRESSION: Probable benign lesion in the right breast.  RECOMMENDATION: Short-term interval followup right mammogram and ultrasound in 6 months is recommended to document stability.  I have discussed the findings and recommendations with the patient. Results were also provided in writing at the conclusion of the visit. If applicable, a reminder letter will be sent to the patient regarding the next appointment.  BI-RADS CATEGORY  3: Probably benign finding(s) - short interval follow-up suggested.   Electronically Signed   By: Baird Lyons M.D.   On: 07/04/2013 09:01   [employer] R  07/04/2013   CLINICAL DATA:  Abnormal screening right mammogram.  EXAM: DIGITAL DIAGNOSTIC  right MAMMOGRAM  ULTRASOUND right BREAST  COMPARISON:  With prior exams.  ACR Breast Density Category b: There are scattered areas of fibroglandular density.  FINDINGS: Spot compression views of the lateral aspect of the right breast were performed. There is persistence of a 4 mm low-density nodule in the posterior 3rd of the breast. There are no malignant type microcalcifications.  On physical exam I do not palpate a mass in the right breast.   Ultrasound is performed, showing there is a near anechoic lesion in the right breast at 7 o'clock 8 cm from the nipple measuring 3 x 2 x 3 mm.  IMPRESSION: Probable benign lesion in the right breast.  RECOMMENDATION: Short-term interval followup right mammogram and ultrasound in 6 months is recommended to document stability.  I have discussed the findings and recommendations with the patient. Results were also provided in writing at the conclusion of the visit. If applicable, a reminder letter will be sent to the patient regarding the next appointment.  BI-RADS CATEGORY  3: Probably benign finding(s) - short interval follow-up suggested.   Electronically Signed   By: Baird Lyons M.D.   On: 07/04/2013 09:01      ASSESSMENT AND PLAN: From a cardiac standpoint, Ms. Padin continues to be stable. She is maintaining sinus rhythm without breakthrough paroxysmal atrial fibrillation. Her blood pressure today is well controlled on combination therapy with Tekturna HCT 300/25 as well as Toprol-XL 75 mg daily. She has not been willing to rechallenge herself with lipid-lowering therapy. In the past we also discussed that he had as an alternative to statins. I recommend a repeat laboratory to recheck in the fasting state consisting of a CBC, CMP, TSH panel, as well as lipid profile. I will review these and make adjustments if necessary. Otherwise I will see her in one year for cardiology reevaluation.     Lennette Bihari, MD, [facility]  07/31/2013 10:32 PM

## 2013-08-01 ENCOUNTER — Encounter: Payer: Self-pay | Admitting: Cardiovascular Disease

## 2013-08-08 ENCOUNTER — Other Ambulatory Visit: Payer: Self-pay | Admitting: Cardiovascular Disease

## 2013-08-08 DIAGNOSIS — I119 Hypertensive heart disease without heart failure: Secondary | ICD-10-CM | POA: Diagnosis not present

## 2013-08-08 DIAGNOSIS — I4891 Unspecified atrial fibrillation: Secondary | ICD-10-CM | POA: Diagnosis not present

## 2013-08-08 LAB — COMPREHENSIVE METABOLIC PANEL
Albumin: 4.4 g/dL (ref 3.5–5.2)
Alkaline Phosphatase: 67 U/L (ref 39–117)
CO2: 30 mEq/L (ref 19–32)
Calcium: 10 mg/dL (ref 8.4–10.5)
Chloride: 103 mEq/L (ref 96–112)
Glucose, Bld: 101 mg/dL — ABNORMAL HIGH (ref 70–99)
Potassium: 4.3 mEq/L (ref 3.5–5.3)
Sodium: 141 mEq/L (ref 135–145)
Total Protein: 6.9 g/dL (ref 6.0–8.3)

## 2013-08-08 LAB — LIPID PANEL
Cholesterol: 250 mg/dL — ABNORMAL HIGH (ref 0–200)
Total CHOL/HDL Ratio: 4.7 Ratio

## 2013-08-08 LAB — CBC
MCH: 29.9 pg (ref 26.0–34.0)
Platelets: 197 10*3/uL (ref 150–400)
RBC: 5.12 MIL/uL — ABNORMAL HIGH (ref 3.87–5.11)

## 2013-08-08 NOTE — Telephone Encounter (Signed)
Rx was sent to pharmacy electronically. 

## 2013-08-21 ENCOUNTER — Encounter: Payer: Self-pay | Admitting: *Deleted

## 2013-10-19 ENCOUNTER — Encounter: Payer: Self-pay | Admitting: Internal Medicine

## 2013-10-19 ENCOUNTER — Ambulatory Visit (INDEPENDENT_AMBULATORY_CARE_PROVIDER_SITE_OTHER): Payer: Medicare Other | Admitting: Internal Medicine

## 2013-10-19 VITALS — BP 140/82 | HR 81 | Temp 98.2°F | Ht 66.0 in | Wt 193.0 lb

## 2013-10-19 DIAGNOSIS — I480 Paroxysmal atrial fibrillation: Secondary | ICD-10-CM

## 2013-10-19 DIAGNOSIS — J209 Acute bronchitis, unspecified: Secondary | ICD-10-CM | POA: Insufficient documentation

## 2013-10-19 DIAGNOSIS — I4891 Unspecified atrial fibrillation: Secondary | ICD-10-CM

## 2013-10-19 DIAGNOSIS — I10 Essential (primary) hypertension: Secondary | ICD-10-CM

## 2013-10-19 MED ORDER — HYDROCODONE-HOMATROPINE 5-1.5 MG/5ML PO SYRP: 5.0000 mL | ORAL_SOLUTION | Freq: Four times a day (QID) | ORAL | Status: DC | PRN

## 2013-10-19 MED ORDER — LEVOFLOXACIN 250 MG PO TABS: 250.0000 mg | ORAL_TABLET | Freq: Every day | ORAL | Status: DC

## 2013-10-19 NOTE — Assessment & Plan Note (Signed)
stable overall by history and exam, recent data reviewed with pt, and pt to continue medical treatment as before,  to f/u any worsening symptoms or concerns BP Readings from Last 3 Encounters:  10/19/13 140/82  07/14/13 140/80  12/24/12 155/78

## 2013-10-19 NOTE — Patient Instructions (Signed)
Please take all new medication as prescribed- the antibiotic, and cough medicine  Please call if you change your mind about the Eliquis for stroke prevention  Please continue all other medications as before Please have the pharmacy call with any other refills you may need.  Please keep your appointments with your specialists as you have planned  - Dr Kelly/cardiology

## 2013-10-19 NOTE — Assessment & Plan Note (Signed)
As per cardiology, reinforced the idea of anticoag use to reduce risk of stroke, pt will continue to consider for now

## 2013-10-19 NOTE — Progress Notes (Signed)
Subjective:    Patient ID: [name], female    DOB: 14-Apr-1933, 80 y.o.   MRN: [id number]  HPI Here with acute onset mild to mod 12 days ST, HA, general weakness and malaise, with prod cough greenish sputum, but Pt denies chest pain, increased sob or doe, wheezing, orthopnea, PND, increased LE swelling, palpitations, dizziness or syncope. Has ongoing misgivings about taking coumadin, as well as other such as eliquis. Pt denies new neurological symptoms such as new headache, or facial or extremity weakness or numbness   Pt denies polydipsia, polyuria, Past Medical History  Diagnosis Date  . Arthritis   . Hypertension   . Iron deficiency anemia, unspecified   . Osteoporosis   . Colon cancer   . Unspecified vascular insufficiency of intestine   . HLD (hyperlipidemia)   . Coronary atherosclerosis of unspecified type of vessel, native or graft   . Personal history of other diseases of circulatory system   . Other and unspecified angina pectoris   . Atrial fibrillation   . Ischemic colitis   . Fatty liver 11/05/11   Past Surgical History  Procedure Laterality Date  . Tubal ligation    . Appendectomy    . Sigmoid resection for invasive rectal adenocarcinoma  12/2006  . Abdominoperineal resection anastomotic stricture  05/2007  . Dilation and curettage of uterus  1992  . Rotator cuff repair  2006    left  . Mandibular renstruction    . Rt. salpingo oophorectomy and cyst removal  2005  . Wrist surgery  2008    right    reports that she has never smoked. She has never used smokeless tobacco. She reports that she does not drink alcohol or use illicit drugs. family history includes Colon cancer (age of onset: 33) in her father; Heart disease in her father. There is no history of Esophageal cancer, Rectal cancer, or Stomach cancer. Allergies  Allergen Reactions  . Acetaminophen-Codeine   . Amlodipine Besylate     REACTION: swelling  . Diltiazem Hcl     REACTION: rash  .  Hydrocodone-Acetaminophen     nausea  . Irbesartan-Hydrochlorothiazide     REACTION: Dizziness  . Lisinopril     cough  . Propoxyphene N-Acetaminophen     Headache, and blotchy face  . Valsartan     REACTION: blurred vision  . Warfarin Sodium     REACTION: body aches and bleeding  . Verapamil Nausea Only, Palpitations and Other (See Comments)    Headaches    Current Outpatient Prescriptions on File Prior to Visit  Medication Sig Dispense Refill  . Calcium Carbonate-Vitamin D (CALCIUM 600 + D PO) Take 2 tablets by mouth daily.      . metoprolol succinate (TOPROL-XL) 50 MG 24 hr tablet TAKE 1/2 TABLET BY MOUTH EVERY MORNING AND TAKE 1 TABLET BY MOUTH EVERY EVENING  135 tablet  3  . metoprolol tartrate (LOPRESSOR) 25 MG tablet Take 25 mg by mouth as needed.       . Multiple Vitamin (MULTIVITAMIN PO) Take 1 tablet by mouth daily.        . Probiotic Product (ALIGN PO) Take 1 tablet by mouth daily.      . ranitidine (ZANTAC) 150 MG tablet Take 1 tablet (150 mg total) by mouth at bedtime.  90 tablet  1  . TEKTURNA HCT 300-25 MG TABS TAKE 1 TABLET BY MOUTH ONCE DAILY  30 each  11   No current facility-administered medications on file  prior to visit.   Review of Systems  Constitutional: Negative for unexpected weight change, or unusual diaphoresis  HENT: Negative for tinnitus.   Eyes: Negative for photophobia and visual disturbance.  Respiratory: Negative for choking and stridor.   Gastrointestinal: Negative for vomiting and blood in stool.  Genitourinary: Negative for hematuria and decreased urine volume.  Musculoskeletal: Negative for acute joint swelling Skin: Negative for color change and wound.  Neurological: Negative for tremors and numbness other than noted  Psychiatric/Behavioral: Negative for decreased concentration or  hyperactivity.       Objective:   Physical Exam BP 140/82  Pulse 81  Temp(Src) 98.2 F (36.8 C) (Oral)  Ht 5\' 6"  (1.676 m)  Wt 193 lb (87.544 kg)  BMI  31.17 kg/m2  SpO2 94% VS noted, mild ill Constitutional: Pt appears well-developed and well-nourished.  HENT: Head: NCAT.  Right Ear: External ear normal.  Left Ear: External ear normal.  Eyes: Conjunctivae and EOM are normal. Pupils are equal, round, and reactive to light.  Bilat tm's with mild erythema.  Max sinus areas non tender.  Pharynx with mild erythema, no exudate Neck: Normal range of motion. Neck supple.  Cardiovascular: Normal rate and regular rhythm.   Pulmonary/Chest: Effort normal and breath sounds normal - no rales or wheezing.  Abd:  Soft, NT, non-distended, + BS Neurological: Pt is alert. Not confused  Skin: Skin is warm. No erythema.  Psychiatric: Pt behavior is normal. Thought content normal.     Assessment & Plan:

## 2013-10-19 NOTE — Assessment & Plan Note (Signed)
Mild to mod, for antibx course,  to f/u any worsening symptoms or concerns 

## 2013-10-19 NOTE — Progress Notes (Signed)
Pre-visit discussion using our clinic review tool. No additional management support is needed unless otherwise documented below in the visit note.  

## 2013-10-20 ENCOUNTER — Telehealth: Payer: Self-pay | Admitting: Internal Medicine

## 2013-10-20 NOTE — Telephone Encounter (Signed)
Actually she is not allergic, as the hydrocodone-acetaminophen is an Intolerance only (only had nausea, no allergy reaction)  The small amount of medication in the cough syrup is easily tolerated, and ok to take

## 2013-10-20 NOTE — Telephone Encounter (Signed)
Patient informed of MD instructions. 

## 2013-10-20 NOTE — Telephone Encounter (Signed)
Patient states that she is allergic to the cough syrup that was prescribed yesterday at her OV with Dr. Jenny Reichmann. She wants to know what other cough medication Dr. Jenny Reichmann might recommend and send to her pharmacy. Please advise.

## 2013-11-04 ENCOUNTER — Other Ambulatory Visit: Payer: Self-pay | Admitting: *Deleted

## 2013-11-04 MED ORDER — RANITIDINE HCL 150 MG PO TABS: 150.0000 mg | ORAL_TABLET | Freq: Every day | ORAL | Status: DC

## 2014-01-04 ENCOUNTER — Other Ambulatory Visit: Payer: Self-pay | Admitting: Internal Medicine

## 2014-01-04 DIAGNOSIS — N63 Unspecified lump in unspecified breast: Secondary | ICD-10-CM

## 2014-01-05 ENCOUNTER — Encounter: Payer: Self-pay | Admitting: *Deleted

## 2014-01-20 ENCOUNTER — Ambulatory Visit: Payer: Medicare Other | Admitting: Internal Medicine

## 2014-02-27 ENCOUNTER — Encounter (INDEPENDENT_AMBULATORY_CARE_PROVIDER_SITE_OTHER): Payer: Self-pay

## 2014-02-27 ENCOUNTER — Ambulatory Visit
Admission: RE | Admit: 2014-02-27 | Discharge: 2014-02-27 | Disposition: A | Discharge status: Home / Self Care | Payer: Medicare Other | Source: Ambulatory Visit | Attending: Internal Medicine | Admitting: Internal Medicine

## 2014-02-27 DIAGNOSIS — N63 Unspecified lump in unspecified breast: Secondary | ICD-10-CM

## 2014-02-27 DIAGNOSIS — N6009 Solitary cyst of unspecified breast: Secondary | ICD-10-CM | POA: Diagnosis not present

## 2014-02-27 LAB — HM MAMMOGRAPHY

## 2014-03-07 ENCOUNTER — Encounter: Payer: Self-pay | Admitting: Internal Medicine

## 2014-03-07 ENCOUNTER — Ambulatory Visit (INDEPENDENT_AMBULATORY_CARE_PROVIDER_SITE_OTHER): Payer: Medicare Other | Admitting: Internal Medicine

## 2014-03-07 VITALS — BP 110/68 | HR 68 | Ht 65.0 in | Wt 194.4 lb

## 2014-03-07 DIAGNOSIS — R1319 Other dysphagia: Secondary | ICD-10-CM

## 2014-03-07 DIAGNOSIS — K222 Esophageal obstruction: Secondary | ICD-10-CM | POA: Diagnosis not present

## 2014-03-07 MED ORDER — RANITIDINE HCL 300 MG PO TABS: 300.0000 mg | ORAL_TABLET | Freq: Every day | ORAL | Status: DC

## 2014-03-07 NOTE — Progress Notes (Signed)
[name] [birth year] [id number]  Note: This dictation was prepared with Dragon digital system. Any transcriptional errors that result from this procedure are unintentional.   History of Present Illness:  This is a 80 year old white female with gastroesophageal reflux disease and esophageal stricture which was dilated in April 2014 with 14,15 and 16 mm Savary dilators. She was found to have a 3 cm hiatal hernia. She had complete relief of dysphagia for several weeks but the dysphagia has recurred. It is only to solids. She denies dysphagia to liquids or pills. She has been on ranitidine 150 mg at bedtime. She denies  heartburn, chest pain, hoarseness or cough. There was no evidence of Barrett's esophagus. She also has a history of sigmoid carcinoma which was resected in 2008. Her last colonoscopy in October 2012 showed a widely patent anastomosis. A recall colonoscopy will be due 5 years from that time.    Past Medical History  Diagnosis Date  . Arthritis   . Hypertension   . Iron deficiency anemia, unspecified   . Osteoporosis   . Colon cancer   . Unspecified vascular insufficiency of intestine   . HLD (hyperlipidemia)   . Coronary atherosclerosis of unspecified type of vessel, native or graft   . Personal history of other diseases of circulatory system   . Other and unspecified angina pectoris   . Atrial fibrillation   . Ischemic colitis   . Fatty liver 11/05/11  . Hiatal hernia   . Esophageal stricture     Past Surgical History  Procedure Laterality Date  . Tubal ligation    . Appendectomy    . Sigmoid resection for invasive rectal adenocarcinoma  12/2006  . Abdominoperineal resection anastomotic stricture  05/2007  . Dilation and curettage of uterus  1992  . Rotator cuff repair  2006    left  . Mandibular renstruction    . Rt. salpingo oophorectomy and cyst removal  2005  . Wrist surgery  2008    right    Allergies  Allergen Reactions  . Acetaminophen-Codeine   .  Amlodipine Besylate     REACTION: swelling  . Diltiazem Hcl     REACTION: rash  . Hydrocodone-Acetaminophen     nausea  . Irbesartan-Hydrochlorothiazide     REACTION: Dizziness  . Lisinopril     cough  . Propoxyphene N-Acetaminophen     Headache, and blotchy face  . Valsartan     REACTION: blurred vision  . Warfarin Sodium     REACTION: body aches and bleeding  . Verapamil Nausea Only, Palpitations and Other (See Comments)    Headaches     Family history and social history have been reviewed.  Review of Systems:   The remainder of the 10 point ROS is negative except as outlined in the H&P  Physical Exam: General Appearance Well developed, in no distress Skin No lesions Neurological Alert and oriented x 3 Psychological Normal mood and affect  Assessment and Plan:   Problem #38 80 year old white female with recurrent solid food dysphagia with a suspected recurrent esophageal stricture versus esophageal dysmotility. We have discussed a barium esophagram versus upper endoscopy and dilation. She would like to wait with both of them. We will increase her ranitidine to 300 mg at bedtime. We discussed strict antireflux measures including head of the bed elevation and avoiding late night meals. She also needs to lose some weight. She would like to try higher dose ranitidine before making the decision about repeat endoscopy and dilation.  Problem #2 History of sigmoid carcinoma. Patient is status post resection. A recall colonoscopy will be due in October 2017.    [name] 03/07/2014

## 2014-03-07 NOTE — Patient Instructions (Signed)
We have sent the following medications to your pharmacy for you to pick up at your convenience: Ranitidine 300 mg nightly (in place of 150 mg nightly)  CC:Dr [name]

## 2014-04-18 ENCOUNTER — Ambulatory Visit (INDEPENDENT_AMBULATORY_CARE_PROVIDER_SITE_OTHER): Payer: Medicare Other | Admitting: Internal Medicine

## 2014-04-18 ENCOUNTER — Encounter: Payer: Self-pay | Admitting: Internal Medicine

## 2014-04-18 VITALS — BP 144/70 | HR 68 | Ht 66.0 in | Wt 194.4 lb

## 2014-04-18 DIAGNOSIS — R1319 Other dysphagia: Secondary | ICD-10-CM

## 2014-04-18 MED ORDER — OMEPRAZOLE 40 MG PO CPDR: 40.0000 mg | DELAYED_RELEASE_CAPSULE | Freq: Every day | ORAL | Status: DC

## 2014-04-18 NOTE — Progress Notes (Signed)
[name] 26-Nov-1932 [id number]  Note: This dictation was prepared with Dragon digital system. Any transcriptional errors that result from this procedure are unintentional.   History of Present Illness:  This is a 80 year old white female with progressive solid food dysphagia consistent with esophageal stricture. She underwent an upper endoscopy in April 2014 with findings of 14 mm benign stricture at 35 cm from the incisors. She was dilated with 14, 15 and 16 mm Savary  dilators. She has been on Zantac 300 mg in the morning. She has noticed progressive solid food dysphagia recently although she denies any heartburn, hoarseness or coughing. Patient has a history of sigmoid adenocarcinoma resected in October 2008. She had a post-anastomotic stricture but on the most recent colonoscopy, the anastomosis was widely patent.    Past Medical History  Diagnosis Date  . Arthritis   . Hypertension   . Iron deficiency anemia, unspecified   . Osteoporosis   . Colon cancer   . Unspecified vascular insufficiency of intestine   . HLD (hyperlipidemia)   . Coronary atherosclerosis of unspecified type of vessel, native or graft   . Personal history of other diseases of circulatory system   . Other and unspecified angina pectoris   . Atrial fibrillation   . Ischemic colitis   . Fatty liver 11/05/11  . Hiatal hernia   . Esophageal stricture     Past Surgical History  Procedure Laterality Date  . Tubal ligation    . Appendectomy    . Sigmoid resection for invasive rectal adenocarcinoma  12/2006  . Abdominoperineal resection anastomotic stricture  05/2007  . Dilation and curettage of uterus  1992  . Rotator cuff repair  2006    left  . Mandibular renstruction    . Rt. salpingo oophorectomy and cyst removal  2005  . Wrist surgery  2008    right    Allergies  Allergen Reactions  . Acetaminophen-Codeine   . Amlodipine Besylate     REACTION: swelling  . Diltiazem Hcl     REACTION: rash  .  Hydrocodone-Acetaminophen     nausea  . Irbesartan-Hydrochlorothiazide     REACTION: Dizziness  . Lisinopril     cough  . Propoxyphene N-Acetaminophen     Headache, and blotchy face  . Valsartan     REACTION: blurred vision  . Warfarin Sodium     REACTION: body aches and bleeding  . Verapamil Nausea Only, Palpitations and Other (See Comments)    Headaches     Family history and social history have been reviewed.  Review of Systems: Solid food dysphagia. Denies dysphagia to liquids  The remainder of the 10 point ROS is negative except as outlined in the H&P  Physical Exam: General Appearance Well developed, in no distress Neck Supple without adenopathy, thyroid not enlarged, no carotid bruits, no JVD Lungs Clear to auscultation bilaterally  Psychological Normal mood and affect  Assessment and Plan:   Problem #79 79 year old white female with recurrent solid food dysphagia consistent with recurrent benign distal esophageal stricture. We will switch her from Zantac 300 mg in the morning to omeprazole 40 mg at bedtime. We will schedule an upper endoscopy with esophageal dilation. I have discussed the procedure with the patient.    [name] 04/18/2014

## 2014-04-18 NOTE — Patient Instructions (Addendum)
You have been scheduled for an endoscopy. Please follow written instructions given to you at your visit today. If you use inhalers (even only as needed), please bring them with you on the day of your procedure. Your physician has requested that you go to www.startemmi.com and enter the access code given to you at your visit today. This web site gives a general overview about your procedure. However, you should still follow specific instructions given to you by our office regarding your preparation for the procedure.  Please discontinue Zantac.  We have sent the following medications to your pharmacy for you to pick up at your convenience: Omeprazole 40 mg every morning  CC:Dr [name].

## 2014-04-26 NOTE — Interval H&P Note (Signed)
History and Physical Interval Note:  04/26/2014 7:04 PM  [name]  has presented today for surgery, with the diagnosis of dsyphagia  The various methods of treatment have been discussed with the patient and family. After consideration of risks, benefits and other options for treatment, the patient has consented to  Procedure(s) with comments: ESOPHAGOGASTRODUODENOSCOPY (EGD) (N/A) SAVORY DILATION (N/A) - no xray needed as a surgical intervention .  The patient's history has been reviewed, patient examined, no change in status, stable for surgery.  I have reviewed the patient's chart and labs.  Questions were answered to the patient's satisfaction.     [name]

## 2014-04-26 NOTE — H&P (View-Only) (Signed)
[name] Jan 23, 1933 [id number]  Note: This dictation was prepared with Dragon digital system. Any transcriptional errors that result from this procedure are unintentional.   History of Present Illness:  This is a 80 year old white female with progressive solid food dysphagia consistent with esophageal stricture. She underwent an upper endoscopy in April 2014 with findings of 14 mm benign stricture at 35 cm from the incisors. She was dilated with 14, 15 and 16 mm Savary  dilators. She has been on Zantac 300 mg in the morning. She has noticed progressive solid food dysphagia recently although she denies any heartburn, hoarseness or coughing. Patient has a history of sigmoid adenocarcinoma resected in October 2008. She had a post-anastomotic stricture but on the most recent colonoscopy, the anastomosis was widely patent.    Past Medical History  Diagnosis Date  . Arthritis   . Hypertension   . Iron deficiency anemia, unspecified   . Osteoporosis   . Colon cancer   . Unspecified vascular insufficiency of intestine   . HLD (hyperlipidemia)   . Coronary atherosclerosis of unspecified type of vessel, native or graft   . Personal history of other diseases of circulatory system   . Other and unspecified angina pectoris   . Atrial fibrillation   . Ischemic colitis   . Fatty liver 11/05/11  . Hiatal hernia   . Esophageal stricture     Past Surgical History  Procedure Laterality Date  . Tubal ligation    . Appendectomy    . Sigmoid resection for invasive rectal adenocarcinoma  12/2006  . Abdominoperineal resection anastomotic stricture  05/2007  . Dilation and curettage of uterus  1992  . Rotator cuff repair  2006    left  . Mandibular renstruction    . Rt. salpingo oophorectomy and cyst removal  2005  . Wrist surgery  2008    right    Allergies  Allergen Reactions  . Acetaminophen-Codeine   . Amlodipine Besylate     REACTION: swelling  . Diltiazem Hcl     REACTION: rash  .  Hydrocodone-Acetaminophen     nausea  . Irbesartan-Hydrochlorothiazide     REACTION: Dizziness  . Lisinopril     cough  . Propoxyphene N-Acetaminophen     Headache, and blotchy face  . Valsartan     REACTION: blurred vision  . Warfarin Sodium     REACTION: body aches and bleeding  . Verapamil Nausea Only, Palpitations and Other (See Comments)    Headaches     Family history and social history have been reviewed.  Review of Systems: Solid food dysphagia. Denies dysphagia to liquids  The remainder of the 10 point ROS is negative except as outlined in the H&P  Physical Exam: General Appearance Well developed, in no distress Neck Supple without adenopathy, thyroid not enlarged, no carotid bruits, no JVD Lungs Clear to auscultation bilaterally  Psychological Normal mood and affect  Assessment and Plan:   Problem #21 80 year old white female with recurrent solid food dysphagia consistent with recurrent benign distal esophageal stricture. We will switch her from Zantac 300 mg in the morning to omeprazole 40 mg at bedtime. We will schedule an upper endoscopy with esophageal dilation. I have discussed the procedure with the patient.    [name] 04/18/2014

## 2014-04-27 ENCOUNTER — Encounter (HOSPITAL_COMMUNITY): Payer: Self-pay | Admitting: *Deleted

## 2014-04-27 ENCOUNTER — Ambulatory Visit (HOSPITAL_COMMUNITY)
Admission: RE | Admit: 2014-04-27 | Discharge: 2014-04-27 | Disposition: A | Discharge status: Home / Self Care | Payer: Medicare Other | Source: Ambulatory Visit | Attending: Internal Medicine | Admitting: Internal Medicine

## 2014-04-27 ENCOUNTER — Encounter (HOSPITAL_COMMUNITY)
Admission: RE | Disposition: A | Discharge status: Home / Self Care | Payer: Medicare Other | Source: Ambulatory Visit | Attending: Internal Medicine

## 2014-04-27 DIAGNOSIS — I4891 Unspecified atrial fibrillation: Secondary | ICD-10-CM | POA: Insufficient documentation

## 2014-04-27 DIAGNOSIS — K559 Vascular disorder of intestine, unspecified: Secondary | ICD-10-CM | POA: Insufficient documentation

## 2014-04-27 DIAGNOSIS — R1319 Other dysphagia: Secondary | ICD-10-CM | POA: Diagnosis not present

## 2014-04-27 DIAGNOSIS — I10 Essential (primary) hypertension: Secondary | ICD-10-CM | POA: Insufficient documentation

## 2014-04-27 DIAGNOSIS — M810 Age-related osteoporosis without current pathological fracture: Secondary | ICD-10-CM | POA: Diagnosis not present

## 2014-04-27 DIAGNOSIS — K222 Esophageal obstruction: Secondary | ICD-10-CM | POA: Diagnosis not present

## 2014-04-27 DIAGNOSIS — E785 Hyperlipidemia, unspecified: Secondary | ICD-10-CM | POA: Diagnosis not present

## 2014-04-27 DIAGNOSIS — K449 Diaphragmatic hernia without obstruction or gangrene: Secondary | ICD-10-CM | POA: Diagnosis not present

## 2014-04-27 DIAGNOSIS — R1310 Dysphagia, unspecified: Secondary | ICD-10-CM | POA: Diagnosis present

## 2014-04-27 DIAGNOSIS — Z85038 Personal history of other malignant neoplasm of large intestine: Secondary | ICD-10-CM | POA: Diagnosis not present

## 2014-04-27 HISTORY — PX: ESOPHAGOGASTRODUODENOSCOPY: SHX5428

## 2014-04-27 HISTORY — PX: SAVORY DILATION: SHX5439

## 2014-04-27 SURGERY — EGD (ESOPHAGOGASTRODUODENOSCOPY)
Anesthesia: Moderate Sedation

## 2014-04-27 MED ORDER — FENTANYL CITRATE 0.05 MG/ML IJ SOLN
INTRAMUSCULAR | Status: AC
  Filled 2014-04-27: qty 4

## 2014-04-27 MED ORDER — DIPHENHYDRAMINE HCL 50 MG/ML IJ SOLN
INTRAMUSCULAR | Status: AC
  Filled 2014-04-27: qty 1

## 2014-04-27 MED ORDER — MIDAZOLAM HCL 10 MG/2ML IJ SOLN
INTRAMUSCULAR | Status: DC | PRN
  Administered 2014-04-27: 2.5 mg via INTRAVENOUS
  Administered 2014-04-27: 2 mg via INTRAVENOUS
  Administered 2014-04-27: 2.5 mg via INTRAVENOUS
  Administered 2014-04-27: 2 mg via INTRAVENOUS

## 2014-04-27 MED ORDER — MIDAZOLAM HCL 10 MG/2ML IJ SOLN
INTRAMUSCULAR | Status: AC
  Filled 2014-04-27: qty 4

## 2014-04-27 MED ORDER — SODIUM CHLORIDE 0.9 % IV SOLN
INTRAVENOUS | Status: DC
  Administered 2014-04-27: 13:00:00 via INTRAVENOUS

## 2014-04-27 MED ORDER — FENTANYL CITRATE 0.05 MG/ML IJ SOLN
INTRAMUSCULAR | Status: DC | PRN
  Administered 2014-04-27 (×4): 25 ug via INTRAVENOUS

## 2014-04-27 MED ORDER — BUTAMBEN-TETRACAINE-BENZOCAINE 2-2-14 % EX AERO
INHALATION_SPRAY | CUTANEOUS | Status: DC | PRN
Start: 2014-04-27 — End: 2014-04-27
  Administered 2014-04-27: 2 via TOPICAL

## 2014-04-27 NOTE — Discharge Instructions (Signed)
Esophagogastroduodenoscopy °Care After °Refer to this sheet in the next few weeks. These instructions provide you with information on caring for yourself after your procedure. Your caregiver may also give you more specific instructions. Your treatment has been planned according to current medical practices, but problems sometimes occur. Call your caregiver if you have any problems or questions after your procedure.  °HOME CARE INSTRUCTIONS °· Do not eat or drink anything until the numbing medicine (local anesthetic) has worn off and your gag reflex has returned. You will know that the local anesthetic has worn off when you can swallow comfortably. °· Do not drive for 12 hours after the procedure or as directed by your caregiver. °· Only take medicines as directed by your caregiver. °SEEK MEDICAL CARE IF:  °· You cannot stop coughing. °· You are not urinating at all or less than usual. °SEEK IMMEDIATE MEDICAL CARE IF: °· You have difficulty swallowing. °· You cannot eat or drink. °· You have worsening throat or chest pain. °· You have dizziness, lightheadedness, or you faint. °· You have nausea or vomiting. °· You have chills. °· You have a fever. °· You have severe abdominal pain. °· You have black, tarry, or bloody stools. °Document Released: 08/04/2012 Document Reviewed: 08/04/2012 °[employer]® Patient Information ©2015 [employer]. This information is not intended to replace advice given to you by your health care provider. Make sure you discuss any questions you have with your health care provider. ° °

## 2014-04-27 NOTE — Op Note (Addendum)
[facility] [place], [address]   ENDOSCOPY PROCEDURE REPORT  PATIENT: Holly, Turner  MR#: [id number] BIRTHDATE: October 02, 1932 , 81  yrs. old GENDER: Female ENDOSCOPIST: Lafayette Dragon, MD REFERRED BY:  Janith Lima, M.D. PROCEDURE DATE:  04/27/2014 PROCEDURE:  EGD, diagnostic and Savary dilation of esophagus ASA CLASS:     Class II INDICATIONS:  Dysphagia.   history of benign distal esophageal stricture.  Last endoscopy in April 2014 showed 14 mm stricture at 35 cm which was dilated with 14, 15 and 16 mm Savary dilators.  She has had recent onset of solid dysphagia, she has been switched from Zantac to omeprazole.for stronger acid supression MEDICATIONS: These medications were titrated to patient response per physician's verbal order, Fentanyl 100 mcg IV, and Versed 9 mg IV  TOPICAL ANESTHETIC: Cetacaine Spray  DESCRIPTION OF PROCEDURE: After the risks benefits and alternatives of the procedure were thoroughly explained, informed consent was obtained.  The Pentax Gastroscope [id number] endoscope was introduced through the mouth and advanced to the second portion of the duodenum. Without limitations.  The instrument was slowly withdrawn as the mucosa was fully examined.      Esophagus: proximal and mid-esophageal mucosa was normal. There was a mild benign appearing esophageal stricture at 35 cm from the incisors. There was  associated spasm. Endoscope traversed without significant resistance. There was no bleeding from the stricture. The diameter of the stricture was about 13 mm[  Stomach: gastric mucosa was normal. Gastric antrum and pyloric outlet were unremarkable. Endoscope was retroflexed and the fundus and cardia appeared normal  Duodenum: duodenal bulb and descending duodenum was normal  Savary dilators passed over the guidewire starting with 14 mm, 15, 16, 17, and 18 mm dilators which passed with mild resistance. There was small  amount of blood on the last dilator        The scope was then withdrawn from the patient and the procedure completed.  COMPLICATIONS: There were no complications. ENDOSCOPIC IMPRESSION:  1. recurrent benign distal esophageal stricture with associated spasm. Status post dilation with Savary dilators from 14-18 mm  RECOMMENDATIONS:  1.  Anti-reflux regimen to be follow 2.  continue omeprazole 40 mg daily Repeat upper endoscopy with dilatation as needed, consider Botox injection if stricturing occurs in short period of time  REPEAT EXAM:  eSigned:  Lafayette Dragon, MD 04/27/2014 2:51 PM   CC:  PATIENT NAME:  Holly, Turner MR#: [id number]

## 2014-04-27 NOTE — Interval H&P Note (Signed)
History and Physical Interval Note:  04/27/2014 2:03 PM  [name]  has presented today for surgery, with the diagnosis of dsyphagia  The various methods of treatment have been discussed with the patient and family. After consideration of risks, benefits and other options for treatment, the patient has consented to  Procedure(s) with comments: ESOPHAGOGASTRODUODENOSCOPY (EGD) (N/A) SAVORY DILATION (N/A) - no xray needed as a surgical intervention .  The patient's history has been reviewed, patient examined, no change in status, stable for surgery.  I have reviewed the patient's chart and labs.  Questions were answered to the patient's satisfaction.     [name]

## 2014-04-28 ENCOUNTER — Encounter (HOSPITAL_COMMUNITY): Payer: Self-pay | Admitting: Internal Medicine

## 2014-06-05 ENCOUNTER — Other Ambulatory Visit: Payer: Self-pay

## 2014-06-05 DIAGNOSIS — Z1231 Encounter for screening mammogram for malignant neoplasm of breast: Secondary | ICD-10-CM

## 2014-06-05 DIAGNOSIS — Z1239 Encounter for other screening for malignant neoplasm of breast: Secondary | ICD-10-CM

## 2014-06-09 DIAGNOSIS — D2371 Other benign neoplasm of skin of right lower limb, including hip: Secondary | ICD-10-CM | POA: Diagnosis not present

## 2014-06-09 DIAGNOSIS — D2372 Other benign neoplasm of skin of left lower limb, including hip: Secondary | ICD-10-CM | POA: Diagnosis not present

## 2014-06-09 DIAGNOSIS — L820 Inflamed seborrheic keratosis: Secondary | ICD-10-CM | POA: Diagnosis not present

## 2014-06-15 DIAGNOSIS — M79631 Pain in right forearm: Secondary | ICD-10-CM | POA: Diagnosis not present

## 2014-06-15 DIAGNOSIS — M7541 Impingement syndrome of right shoulder: Secondary | ICD-10-CM | POA: Diagnosis not present

## 2014-06-15 DIAGNOSIS — G5630 Lesion of radial nerve, unspecified upper limb: Secondary | ICD-10-CM | POA: Diagnosis not present

## 2014-06-15 DIAGNOSIS — M79632 Pain in left forearm: Secondary | ICD-10-CM | POA: Diagnosis not present

## 2014-07-05 ENCOUNTER — Encounter (INDEPENDENT_AMBULATORY_CARE_PROVIDER_SITE_OTHER): Payer: Self-pay

## 2014-07-05 ENCOUNTER — Ambulatory Visit
Admission: RE | Admit: 2014-07-05 | Discharge: 2014-07-05 | Disposition: A | Discharge status: Home / Self Care | Payer: Medicare Other | Source: Ambulatory Visit

## 2014-07-05 DIAGNOSIS — Z1231 Encounter for screening mammogram for malignant neoplasm of breast: Secondary | ICD-10-CM | POA: Diagnosis not present

## 2014-07-05 LAB — HM MAMMOGRAPHY: HM Mammogram: NORMAL

## 2014-07-13 DIAGNOSIS — M7541 Impingement syndrome of right shoulder: Secondary | ICD-10-CM | POA: Diagnosis not present

## 2014-07-18 ENCOUNTER — Ambulatory Visit (INDEPENDENT_AMBULATORY_CARE_PROVIDER_SITE_OTHER): Payer: Medicare Other | Admitting: Cardiovascular Disease

## 2014-07-18 ENCOUNTER — Encounter: Payer: Self-pay | Admitting: Cardiovascular Disease

## 2014-07-18 VITALS — BP 118/76 | HR 59 | Ht 66.0 in | Wt 193.6 lb

## 2014-07-18 DIAGNOSIS — I480 Paroxysmal atrial fibrillation: Secondary | ICD-10-CM

## 2014-07-18 DIAGNOSIS — E669 Obesity, unspecified: Secondary | ICD-10-CM | POA: Diagnosis not present

## 2014-07-18 DIAGNOSIS — E7800 Pure hypercholesterolemia, unspecified: Secondary | ICD-10-CM

## 2014-07-18 DIAGNOSIS — E782 Mixed hyperlipidemia: Secondary | ICD-10-CM

## 2014-07-18 DIAGNOSIS — Z79899 Other long term (current) drug therapy: Secondary | ICD-10-CM

## 2014-07-18 DIAGNOSIS — I2510 Atherosclerotic heart disease of native coronary artery without angina pectoris: Secondary | ICD-10-CM

## 2014-07-18 DIAGNOSIS — I10 Essential (primary) hypertension: Secondary | ICD-10-CM | POA: Diagnosis not present

## 2014-07-18 NOTE — Patient Instructions (Signed)
Your physician recommends that you return for lab work in: fasting.  Your physician wants you to follow-up in: 1 year or sooner if needed. You will receive a reminder letter in the mail two months in advance. If you don't receive a letter, please call our office to schedule the follow-up appointment.

## 2014-07-19 ENCOUNTER — Other Ambulatory Visit: Payer: Self-pay | Admitting: *Deleted

## 2014-07-19 ENCOUNTER — Encounter: Payer: Self-pay | Admitting: Cardiovascular Disease

## 2014-07-19 DIAGNOSIS — E66811 Obesity, class 1: Secondary | ICD-10-CM | POA: Insufficient documentation

## 2014-07-19 DIAGNOSIS — E669 Obesity, unspecified: Secondary | ICD-10-CM | POA: Insufficient documentation

## 2014-07-19 MED ORDER — OMEPRAZOLE 40 MG PO CPDR: 40.0000 mg | DELAYED_RELEASE_CAPSULE | Freq: Every day | ORAL | Status: DC

## 2014-07-19 NOTE — Progress Notes (Signed)
Patient ID: [name], female   DOB: 1933-08-15, 80 y.o.   MRN: [id number]     HPI: [name] is a 80 y.o. female who presents for one-year cardiology evaluation.  Ms. Seefeld has a history of paroxysmal atrial fibrillation, hypertension, as well as hyperlipidemia. Remotely, she developed myalgias with simvastatin and had not been willing to try additional lipid lowering therapy. In August 2012 an echo Doppler study showed mild asymmetric LVH with proximal septal thickening without LVOT obstruction. She had grade 1 diastolic dysfunction with normal systolic function, mild MR, mild TR, and aortic valve sclerosis with mild MR.  I last saw her in November 2014.  She states over the past year.  She is unaware of any recurrent atrial fibrillation or palpitations.  She denies chest pain.  She denies PND or orthopnea.  In December 2014.  Follow-up blood work showed normal renal function with a BUN of 19, creatinine 0.8.  She continued to have significant hyperlipidemia with a total cholesterol of 250, triglycerides 221 HDL 53, VLDL 44 and LDL cholesterol 153.  TSH was normal at 2.275.  She has not had blood work done since that time.  She presents for evaluation.  Past Medical History  Diagnosis Date  . Arthritis   . Hypertension   . Iron deficiency anemia, unspecified   . Osteoporosis   . Colon cancer   . Unspecified vascular insufficiency of intestine   . HLD (hyperlipidemia)   . Coronary atherosclerosis of unspecified type of vessel, native or graft   . Personal history of other diseases of circulatory system   . Other and unspecified angina pectoris   . Atrial fibrillation   . Ischemic colitis   . Fatty liver 11/05/11  . Hiatal hernia   . Esophageal stricture     Past Surgical History  Procedure Laterality Date  . Tubal ligation    . Appendectomy    . Sigmoid resection for invasive rectal adenocarcinoma  12/2006  . Abdominoperineal resection anastomotic stricture  05/2007  .  Dilation and curettage of uterus  1992  . Rotator cuff repair  2006    left  . Mandibular renstruction    . Rt. salpingo oophorectomy and cyst removal  2005  . Wrist surgery  2008    right  . Esophagogastroduodenoscopy N/A 04/27/2014    Procedure: ESOPHAGOGASTRODUODENOSCOPY (EGD);  Surgeon: Lafayette Dragon, MD;  Location: [facility];  Service: Endoscopy;  Laterality: N/A;  . Savory dilation N/A 04/27/2014    Procedure: SAVORY DILATION;  Surgeon: Lafayette Dragon, MD;  Location: [facility];  Service: Endoscopy;  Laterality: N/A;  no xray needed    Allergies  Allergen Reactions  . Acetaminophen-Codeine   . Amlodipine Besylate     REACTION: swelling  . Diltiazem Hcl     REACTION: rash  . Hydrocodone-Acetaminophen     nausea  . Irbesartan-Hydrochlorothiazide     REACTION: Dizziness  . Lisinopril     cough  . Propoxyphene N-Acetaminophen     Headache, and blotchy face  . Valsartan     REACTION: blurred vision  . Warfarin Sodium     REACTION: body aches and bleeding  . Verapamil Nausea Only, Palpitations and Other (See Comments)    Headaches     Current Outpatient Prescriptions  Medication Sig Dispense Refill  . Calcium Carbonate-Vitamin D (CALCIUM 600 + D PO) Take 2 tablets by mouth daily.    . metoprolol succinate (TOPROL-XL) 50 MG 24 hr tablet TAKE 1/2  TABLET BY MOUTH EVERY MORNING AND TAKE 1 TABLET BY MOUTH EVERY EVENING 135 tablet 3  . metoprolol tartrate (LOPRESSOR) 25 MG tablet Take 25 mg by mouth as needed.     . Multiple Vitamin (MULTIVITAMIN PO) Take 1 tablet by mouth daily.      . Probiotic Product (ALIGN PO) Take 1 tablet by mouth daily.    . TEKTURNA HCT 300-25 MG TABS TAKE 1 TABLET BY MOUTH ONCE DAILY 30 each 11  . omeprazole (PRILOSEC) 40 MG capsule Take 1 capsule (40 mg total) by mouth daily. 30 capsule 0   No current facility-administered medications for this visit.    History   Social History  . Marital Status: Widowed    Spouse Name: N/A    Number of  Children: 4  . Years of Education: N/A   Occupational History  . retired    Social History Main Topics  . Smoking status: Never Smoker   . Smokeless tobacco: Never Used  . Alcohol Use: No  . Drug Use: No  . Sexual Activity: Not Currently   Other Topics Concern  . Not on file   Social History Narrative    Family History  Problem Relation Age of Onset  . Colon cancer Father 34  . Heart disease Father   . Esophageal cancer Neg Hx   . Rectal cancer Neg Hx   . Stomach cancer Neg Hx    Social he is notable that she is widowed. She has 3 children and 9 grandchildren. She remains active. There is no alcohol tobacco use.   ROS General: Negative; No fevers, chills, or night sweats;  HEENT: Negative; No changes in vision or hearing, sinus congestion, difficulty swallowing Pulmonary: Negative; No cough, wheezing, shortness of breath, hemoptysis Cardiovascular: Negative; No chest pain, presyncope, syncope, palpitations GI: Negative; No nausea, vomiting, diarrhea, or abdominal pain GU: Negative; No dysuria, hematuria, or difficulty voiding Musculoskeletal: Negative; no myalgias, joint pain, or weakness Hematologic/Oncology: Negative; no easy bruising, bleeding Endocrine: Negative; no heat/cold intolerance; no diabetes Neuro: Negative; no changes in balance, headaches Skin: Negative; No rashes or skin lesions Psychiatric: Negative; No behavioral problems, depression Sleep: Negative; No snoring, daytime sleepiness, hypersomnolence, bruxism, restless legs, hypnogognic hallucinations, no cataplexy Other comprehensive 14 point system review is negative.   PE BP 118/76 mmHg  Pulse 59  Ht 5' 6"  (1.676 m)  Wt 193 lb 9.6 oz (87.816 kg)  BMI 31.26 kg/m2  Repeat blood pressure by me was 136/80. General: Alert, oriented, no distress.  Skin: normal turgor, no rashes HEENT: Normocephalic, atraumatic. Pupils round and reactive; sclera anicteric;no lid lag.  Nose without nasal septal  hypertrophy Mouth/Parynx benign; Mallinpatti scale 2 Neck: No JVD, no carotid bruits with normal carotid upstroke Chest wall: Nontender to palpation Lungs: clear to ausculatation and percussion; no wheezing or rales Heart: RRR, s1 s2 normal 1/6 systolic murmur, with a faint aortic insufficiency murmur; .  No S3 gallop.  No diastolic murmur rubs thrills or heaves. Abdomen: soft, nontender; no hepatosplenomehaly, BS+; abdominal aorta nontender and not dilated by palpation. Back: No CVA tenderness Pulses 2+ Extremities: no clubbing cyanosis or edema, Homan's sign negative  Neurologic: grossly nonfocal Psychologic: normal affect and mood.  ECG (independently read by me);  Normal sinus rhythm at 59 bpm.  QTc interval 415 ms.  No significant ST segment changes.  Prior November 2014ECG: Sinus rhythm at 63 beats per minute. Normal intervals.  LABS:  BMET    Component Value Date/Time   NA  141 08/08/2013 0924   NA 143 09/26/2009 1013   K 4.3 08/08/2013 0924   K 3.9 09/26/2009 1013   CL 103 08/08/2013 0924   CL 108 09/26/2009 1013   CO2 30 08/08/2013 0924   CO2 30 09/26/2009 1013   GLUCOSE 101* 08/08/2013 0924   GLUCOSE 103 09/26/2009 1013   BUN 19 08/08/2013 0924   BUN 18 09/26/2009 1013   CREATININE 0.87 08/08/2013 0924   CREATININE 0.89 03/18/2012 1010   CALCIUM 10.0 08/08/2013 0924   CALCIUM 10.0 09/26/2009 1013   GFRNONAA >60 05/20/2007 0413   GFRAA  05/20/2007 0413    >60        The eGFR has been calculated using the MDRD equation. This calculation has not been validated in all clinical     Hepatic Function Panel     Component Value Date/Time   PROT 6.9 08/08/2013 0924   PROT 7.6 09/26/2009 1013   ALBUMIN 4.4 08/08/2013 0924   AST 21 08/08/2013 0924   AST 26 09/26/2009 1013   ALT 22 08/08/2013 0924   ALT 24 09/26/2009 1013   ALKPHOS 67 08/08/2013 0924   ALKPHOS 66 09/26/2009 1013   BILITOT 0.8 08/08/2013 0924   BILITOT 0.90 09/26/2009 1013   BILIDIR 0.1  11/04/2011 1434     CBC    Component Value Date/Time   WBC 4.3 08/08/2013 0924   WBC 5.1 03/18/2012 1010   WBC 5.5 10/09/2008 0817   RBC 5.12* 08/08/2013 0924   RBC 4.84 03/18/2012 1010   RBC 4.89 10/09/2008 0817   HGB 15.3* 08/08/2013 0924   HGB 15.0 03/18/2012 1010   HGB 14.9 10/09/2008 0817   HCT 44.0 08/08/2013 0924   HCT 43.7 03/18/2012 1010   HCT 43.0 10/09/2008 0817   PLT 197 08/08/2013 0924   PLT 158 03/18/2012 1010   PLT 221 10/09/2008 0817   MCV 85.9 08/08/2013 0924   MCV 90 03/18/2012 1010   MCV 88.0 10/09/2008 0817   MCH 29.9 08/08/2013 0924   MCH 31.0 03/18/2012 1010   MCH 30.5 10/09/2008 0817   MCHC 34.8 08/08/2013 0924   MCHC 34.3 03/18/2012 1010   MCHC 34.7 10/09/2008 0817   RDW 13.8 08/08/2013 0924   RDW 13.2 03/18/2012 1010   RDW 12.7 10/09/2008 0817   LYMPHSABS 1.9 03/18/2012 1010   LYMPHSABS 1.6 07/21/2011 0952   LYMPHSABS 1.9 10/09/2008 0817   MONOABS 0.5 07/21/2011 0952   MONOABS 0.5 10/09/2008 0817   EOSABS 0.3 03/18/2012 1010   EOSABS 0.2 07/21/2011 0952   EOSABS 0.1 10/09/2008 0817   BASOSABS 0.0 03/18/2012 1010   BASOSABS 0.0 07/21/2011 0952   BASOSABS 0.0 10/09/2008 0817     BNP No results found for: PROBNP  Lipid Panel     Component Value Date/Time   CHOL 250* 08/08/2013 0924   TRIG 221* 08/08/2013 0924   HDL 53 08/08/2013 0924   CHOLHDL 4.7 08/08/2013 0924   VLDL 44* 08/08/2013 0924   LDLCALC 153* 08/08/2013 0924     RADIOLOGY: US Breast Right  07/04/2013   CLINICAL DATA:  Abnormal screening right mammogram.  EXAM: DIGITAL DIAGNOSTIC  right MAMMOGRAM  ULTRASOUND right BREAST  COMPARISON:  With prior exams.  ACR Breast Density Category b: There are scattered areas of fibroglandular density.  FINDINGS: Spot compression views of the lateral aspect of the right breast were performed. There is persistence of a 4 mm low-density nodule in the posterior 3rd of the breast. There are no malignant  type microcalcifications.  On  physical exam I do not palpate a mass in the right breast.  Ultrasound is performed, showing there is a near anechoic lesion in the right breast at 7 o'clock 8 cm from the nipple measuring 3 x 2 x 3 mm.  IMPRESSION: Probable benign lesion in the right breast.  RECOMMENDATION: Short-term interval followup right mammogram and ultrasound in 6 months is recommended to document stability.  I have discussed the findings and recommendations with the patient. Results were also provided in writing at the conclusion of the visit. If applicable, a reminder letter will be sent to the patient regarding the next appointment.  BI-RADS CATEGORY  3: Probably benign finding(s) - short interval follow-up suggested.   Electronically Signed   By: Lillia Mountain M.D.   On: 07/04/2013 09:01   Mm [place] R  07/04/2013   CLINICAL DATA:  Abnormal screening right mammogram.  EXAM: DIGITAL DIAGNOSTIC  right MAMMOGRAM  ULTRASOUND right BREAST  COMPARISON:  With prior exams.  ACR Breast Density Category b: There are scattered areas of fibroglandular density.  FINDINGS: Spot compression views of the lateral aspect of the right breast were performed. There is persistence of a 4 mm low-density nodule in the posterior 3rd of the breast. There are no malignant type microcalcifications.  On physical exam I do not palpate a mass in the right breast.  Ultrasound is performed, showing there is a near anechoic lesion in the right breast at 7 o'clock 8 cm from the nipple measuring 3 x 2 x 3 mm.  IMPRESSION: Probable benign lesion in the right breast.  RECOMMENDATION: Short-term interval followup right mammogram and ultrasound in 6 months is recommended to document stability.  I have discussed the findings and recommendations with the patient. Results were also provided in writing at the conclusion of the visit. If applicable, a reminder letter will be sent to the patient regarding the next appointment.  BI-RADS CATEGORY  3: Probably benign  finding(s) - short interval follow-up suggested.   Electronically Signed   By: Lillia Mountain M.D.   On: 07/04/2013 09:01      ASSESSMENT AND PLAN: Ms. Britta Mccreedy is a 80 year old female who is maintaining sinus rhythm without recurrent atrial fibrillation.  Her blood pressure today is stable at 118/76.  On Toprol-XL 75 mg daily and a split regimen, as well as Tekturna HCT 300/25.  Remotely, she had allergies to lisinopril, amlodipine, diltiazem irbesartan as well as valsartan and for this reason is on direct renin inhibition. I again reviewed her laboratory which showed significant hyperlipidemia.  I again discussed potential rechallenge with low-dose statin therapy versus Zetia.  I will recheck a complete set of laboratory.  We will contact her regarding the blood work results and she hopefully will institute therapy for her significant hyperlipidemia.  She is mildly obese with a body mass index of 31.26.  We did discussed importance of weight loss.  She's not having any chest pain.  There is no PND or orthopnea. I'll see her back in the office in follow-up, sooner if therapy is instituted for her hyperlipidemia.  Otherwise, I will see her one year for reevaluation.  Time spent: 25 minutes  Troy Sine, MD, [facility]  07/19/2014 7:16 PM

## 2014-07-24 DIAGNOSIS — E782 Mixed hyperlipidemia: Secondary | ICD-10-CM | POA: Diagnosis not present

## 2014-07-24 DIAGNOSIS — I10 Essential (primary) hypertension: Secondary | ICD-10-CM | POA: Diagnosis not present

## 2014-07-24 DIAGNOSIS — I2510 Atherosclerotic heart disease of native coronary artery without angina pectoris: Secondary | ICD-10-CM | POA: Diagnosis not present

## 2014-07-24 DIAGNOSIS — Z79899 Other long term (current) drug therapy: Secondary | ICD-10-CM | POA: Diagnosis not present

## 2014-07-24 LAB — LIPID PANEL
CHOL/HDL RATIO: 4.1 ratio
Cholesterol: 216 mg/dL — ABNORMAL HIGH (ref 0–200)
HDL: 53 mg/dL (ref >39)
LDL CALC: 121 mg/dL — AB (ref 0–99)
Triglycerides: 210 mg/dL — ABNORMAL HIGH (ref <150)
VLDL: 42 mg/dL — AB (ref 0–40)

## 2014-07-24 LAB — CBC
HCT: 44.8 % (ref 36.0–46.0)
Hemoglobin: 15.9 g/dL — ABNORMAL HIGH (ref 12.0–15.0)
MCH: 30.9 pg (ref 26.0–34.0)
MCHC: 35.5 g/dL (ref 30.0–36.0)
MCV: 87 fL (ref 78.0–100.0)
MPV: 8.9 fL — ABNORMAL LOW (ref 9.4–12.4)
PLATELETS: 194 10*3/uL (ref 150–400)
RBC: 5.15 MIL/uL — ABNORMAL HIGH (ref 3.87–5.11)
RDW: 13.7 % (ref 11.5–15.5)
WBC: 5.6 10*3/uL (ref 4.0–10.5)

## 2014-07-24 LAB — COMPREHENSIVE METABOLIC PANEL
ALK PHOS: 58 U/L (ref 39–117)
ALT: 20 U/L (ref 0–35)
AST: 21 U/L (ref 0–37)
Albumin: 4.4 g/dL (ref 3.5–5.2)
BUN: 21 mg/dL (ref 6–23)
CALCIUM: 10.1 mg/dL (ref 8.4–10.5)
CO2: 27 mEq/L (ref 19–32)
CREATININE: 0.86 mg/dL (ref 0.50–1.10)
Chloride: 104 mEq/L (ref 96–112)
Glucose, Bld: 101 mg/dL — ABNORMAL HIGH (ref 70–99)
Potassium: 4.1 mEq/L (ref 3.5–5.3)
Sodium: 143 mEq/L (ref 135–145)
Total Bilirubin: 1 mg/dL (ref 0.2–1.2)
Total Protein: 7.2 g/dL (ref 6.0–8.3)

## 2014-07-24 LAB — TSH: TSH: 1.583 u[IU]/mL (ref 0.350–4.500)

## 2014-07-25 ENCOUNTER — Other Ambulatory Visit: Payer: Self-pay | Admitting: *Deleted

## 2014-07-25 MED ORDER — METOPROLOL SUCCINATE ER 50 MG PO TB24: ORAL_TABLET | ORAL | Status: DC

## 2014-07-25 NOTE — Telephone Encounter (Signed)
Refilled electronically 

## 2014-08-02 ENCOUNTER — Other Ambulatory Visit: Payer: Self-pay | Admitting: *Deleted

## 2014-08-02 MED ORDER — ALISKIREN-HYDROCHLOROTHIAZIDE 300-25 MG PO TABS: 1.0000 | ORAL_TABLET | Freq: Every day | ORAL | Status: DC

## 2014-08-02 NOTE — Telephone Encounter (Signed)
Refilled electronically 

## 2014-08-08 DIAGNOSIS — G5630 Lesion of radial nerve, unspecified upper limb: Secondary | ICD-10-CM | POA: Diagnosis not present

## 2014-08-21 ENCOUNTER — Telehealth: Payer: Self-pay | Admitting: *Deleted

## 2014-08-21 ENCOUNTER — Telehealth: Payer: Self-pay | Admitting: Cardiovascular Disease

## 2014-08-21 NOTE — Progress Notes (Signed)
Patient notified of results and recommendations.

## 2014-08-21 NOTE — Telephone Encounter (Signed)
-----   Message from Troy Sine, MD sent at 08/21/2014 10:52 AM EST ----- Has not tolerated statin. Try fenofibrate 145 mg

## 2014-08-21 NOTE — Telephone Encounter (Signed)
Patient returned a call to me. I notified her of lab results and recommendations. She tells me that she is currently seeing Dr. Burney Gauze for joint and muscle pain. He has told her not to take anything for her cholesterol right now until they get her pain under control. She will inform Dr. Burney Gauze  that Dr. Claiborne Billings wants her to try her on this medication. She will call us back to advise [place] if he says it is okay  for her to take. I stated to patient that we will await a return call from her before sending in a prescription.

## 2014-08-21 NOTE — Telephone Encounter (Signed)
Returning your call. °

## 2014-08-21 NOTE — Telephone Encounter (Signed)
Left message to return a call to discuss lab results. 

## 2014-08-21 NOTE — Telephone Encounter (Signed)
Patient notified of results and recommendations.

## 2014-08-22 DIAGNOSIS — G5630 Lesion of radial nerve, unspecified upper limb: Secondary | ICD-10-CM | POA: Diagnosis not present

## 2014-08-22 DIAGNOSIS — M79631 Pain in right forearm: Secondary | ICD-10-CM | POA: Diagnosis not present

## 2014-08-28 ENCOUNTER — Other Ambulatory Visit: Payer: Self-pay | Admitting: *Deleted

## 2014-08-28 MED ORDER — OMEPRAZOLE 40 MG PO CPDR: 40.0000 mg | DELAYED_RELEASE_CAPSULE | Freq: Every day | ORAL | Status: DC

## 2014-09-05 DIAGNOSIS — M7541 Impingement syndrome of right shoulder: Secondary | ICD-10-CM | POA: Diagnosis not present

## 2014-09-05 DIAGNOSIS — G5630 Lesion of radial nerve, unspecified upper limb: Secondary | ICD-10-CM | POA: Diagnosis not present

## 2014-09-18 DIAGNOSIS — M19011 Primary osteoarthritis, right shoulder: Secondary | ICD-10-CM | POA: Diagnosis not present

## 2014-09-19 DIAGNOSIS — M7541 Impingement syndrome of right shoulder: Secondary | ICD-10-CM | POA: Diagnosis not present

## 2014-09-26 DIAGNOSIS — M75100 Unspecified rotator cuff tear or rupture of unspecified shoulder, not specified as traumatic: Secondary | ICD-10-CM | POA: Diagnosis not present

## 2014-09-26 DIAGNOSIS — M2550 Pain in unspecified joint: Secondary | ICD-10-CM | POA: Diagnosis not present

## 2014-09-26 DIAGNOSIS — M1990 Unspecified osteoarthritis, unspecified site: Secondary | ICD-10-CM | POA: Diagnosis not present

## 2014-09-26 DIAGNOSIS — M791 Myalgia: Secondary | ICD-10-CM | POA: Diagnosis not present

## 2014-10-03 DIAGNOSIS — M19011 Primary osteoarthritis, right shoulder: Secondary | ICD-10-CM | POA: Diagnosis not present

## 2014-10-05 DIAGNOSIS — M25511 Pain in right shoulder: Secondary | ICD-10-CM | POA: Diagnosis not present

## 2014-10-05 DIAGNOSIS — M6281 Muscle weakness (generalized): Secondary | ICD-10-CM | POA: Diagnosis not present

## 2014-10-05 DIAGNOSIS — M75121 Complete rotator cuff tear or rupture of right shoulder, not specified as traumatic: Secondary | ICD-10-CM | POA: Diagnosis not present

## 2014-10-09 DIAGNOSIS — M25511 Pain in right shoulder: Secondary | ICD-10-CM | POA: Diagnosis not present

## 2014-10-09 DIAGNOSIS — M75121 Complete rotator cuff tear or rupture of right shoulder, not specified as traumatic: Secondary | ICD-10-CM | POA: Diagnosis not present

## 2014-10-09 DIAGNOSIS — M6281 Muscle weakness (generalized): Secondary | ICD-10-CM | POA: Diagnosis not present

## 2014-10-12 DIAGNOSIS — M6281 Muscle weakness (generalized): Secondary | ICD-10-CM | POA: Diagnosis not present

## 2014-10-12 DIAGNOSIS — M75121 Complete rotator cuff tear or rupture of right shoulder, not specified as traumatic: Secondary | ICD-10-CM | POA: Diagnosis not present

## 2014-10-12 DIAGNOSIS — M25511 Pain in right shoulder: Secondary | ICD-10-CM | POA: Diagnosis not present

## 2014-10-17 DIAGNOSIS — M6281 Muscle weakness (generalized): Secondary | ICD-10-CM | POA: Diagnosis not present

## 2014-10-17 DIAGNOSIS — M75121 Complete rotator cuff tear or rupture of right shoulder, not specified as traumatic: Secondary | ICD-10-CM | POA: Diagnosis not present

## 2014-10-17 DIAGNOSIS — M25511 Pain in right shoulder: Secondary | ICD-10-CM | POA: Diagnosis not present

## 2014-10-19 DIAGNOSIS — M6281 Muscle weakness (generalized): Secondary | ICD-10-CM | POA: Diagnosis not present

## 2014-10-19 DIAGNOSIS — M25511 Pain in right shoulder: Secondary | ICD-10-CM | POA: Diagnosis not present

## 2014-10-19 DIAGNOSIS — M75121 Complete rotator cuff tear or rupture of right shoulder, not specified as traumatic: Secondary | ICD-10-CM | POA: Diagnosis not present

## 2014-10-24 DIAGNOSIS — M75121 Complete rotator cuff tear or rupture of right shoulder, not specified as traumatic: Secondary | ICD-10-CM | POA: Diagnosis not present

## 2014-10-24 DIAGNOSIS — M25511 Pain in right shoulder: Secondary | ICD-10-CM | POA: Diagnosis not present

## 2014-10-24 DIAGNOSIS — M6281 Muscle weakness (generalized): Secondary | ICD-10-CM | POA: Diagnosis not present

## 2014-10-25 DIAGNOSIS — M2550 Pain in unspecified joint: Secondary | ICD-10-CM | POA: Diagnosis not present

## 2014-10-25 DIAGNOSIS — M75100 Unspecified rotator cuff tear or rupture of unspecified shoulder, not specified as traumatic: Secondary | ICD-10-CM | POA: Diagnosis not present

## 2014-10-25 DIAGNOSIS — M7712 Lateral epicondylitis, left elbow: Secondary | ICD-10-CM | POA: Diagnosis not present

## 2014-10-25 DIAGNOSIS — M1990 Unspecified osteoarthritis, unspecified site: Secondary | ICD-10-CM | POA: Diagnosis not present

## 2014-10-26 DIAGNOSIS — M6281 Muscle weakness (generalized): Secondary | ICD-10-CM | POA: Diagnosis not present

## 2014-10-26 DIAGNOSIS — M75121 Complete rotator cuff tear or rupture of right shoulder, not specified as traumatic: Secondary | ICD-10-CM | POA: Diagnosis not present

## 2014-10-26 DIAGNOSIS — M25511 Pain in right shoulder: Secondary | ICD-10-CM | POA: Diagnosis not present

## 2014-11-01 DIAGNOSIS — M75121 Complete rotator cuff tear or rupture of right shoulder, not specified as traumatic: Secondary | ICD-10-CM | POA: Diagnosis not present

## 2014-11-01 DIAGNOSIS — M25511 Pain in right shoulder: Secondary | ICD-10-CM | POA: Diagnosis not present

## 2014-11-01 DIAGNOSIS — M6281 Muscle weakness (generalized): Secondary | ICD-10-CM | POA: Diagnosis not present

## 2014-11-02 DIAGNOSIS — M1711 Unilateral primary osteoarthritis, right knee: Secondary | ICD-10-CM | POA: Diagnosis not present

## 2014-11-09 ENCOUNTER — Telehealth: Payer: Self-pay | Admitting: Internal Medicine

## 2014-11-09 NOTE — Telephone Encounter (Signed)
Patient calling to report multiple complaints such as leg weakness, nausea, tired, headaches and pain in arms. She thinks the Omeprazole has caused all of these symptoms. She stopped taking it 3 days ago. Wants to discuss with Dr. Olevia Perches. Scheduled OV on 11/15/14 at 8:45 AM.

## 2014-11-10 ENCOUNTER — Encounter: Payer: Self-pay | Admitting: Internal Medicine

## 2014-11-10 ENCOUNTER — Ambulatory Visit (INDEPENDENT_AMBULATORY_CARE_PROVIDER_SITE_OTHER): Payer: Medicare Other | Admitting: Internal Medicine

## 2014-11-10 VITALS — BP 122/80 | HR 60 | Temp 98.5°F | Resp 16 | Ht 66.0 in | Wt 193.0 lb

## 2014-11-10 DIAGNOSIS — I10 Essential (primary) hypertension: Secondary | ICD-10-CM | POA: Diagnosis not present

## 2014-11-10 DIAGNOSIS — J209 Acute bronchitis, unspecified: Secondary | ICD-10-CM | POA: Diagnosis not present

## 2014-11-10 MED ORDER — PROMETHAZINE-DM 6.25-15 MG/5ML PO SYRP: 5.0000 mL | ORAL_SOLUTION | Freq: Four times a day (QID) | ORAL | Status: DC | PRN

## 2014-11-10 MED ORDER — AZITHROMYCIN 500 MG PO TABS: 500.0000 mg | ORAL_TABLET | Freq: Every day | ORAL | Status: DC

## 2014-11-10 NOTE — Patient Instructions (Signed)

## 2014-11-10 NOTE — Progress Notes (Signed)
Pre visit review using our clinic review tool, if applicable. No additional management support is needed unless otherwise documented below in the visit note. 

## 2014-11-13 NOTE — Assessment & Plan Note (Signed)
Will treat the infection with zithromax, will control the cough with phenergan-dm

## 2014-11-13 NOTE — Progress Notes (Signed)
Subjective:    Patient ID: [name], female    DOB: 1933/07/20, 81 y.o.   MRN: [id number]  Cough This is a recurrent problem. The current episode started 1 to 4 weeks ago. The problem has been gradually worsening. The problem occurs every few hours. The cough is productive of purulent sputum. Associated symptoms include chest pain, chills, headaches, a sore throat and sweats. Pertinent negatives include no ear congestion, ear pain, fever, heartburn, hemoptysis, myalgias, nasal congestion, postnasal drip, rash, rhinorrhea, shortness of breath, weight loss or wheezing. She has tried OTC cough suppressant for the symptoms. The treatment provided mild relief.  Hypertension Associated symptoms include chest pain, headaches and sweats. Pertinent negatives include no palpitations or shortness of breath.      Review of Systems  Constitutional: Positive for chills. Negative for fever, weight loss, diaphoresis, activity change, appetite change and fatigue.  HENT: Positive for congestion and sore throat. Negative for ear pain, postnasal drip, rhinorrhea, sinus pressure, trouble swallowing and voice change.   Eyes: Negative.   Respiratory: Positive for cough. Negative for apnea, hemoptysis, choking, chest tightness, shortness of breath, wheezing and stridor.   Cardiovascular: Positive for chest pain. Negative for palpitations and leg swelling.  Gastrointestinal: Negative.  Negative for heartburn, nausea, vomiting, abdominal pain, diarrhea and blood in stool.  Endocrine: Negative.   Genitourinary: Negative.   Musculoskeletal: Negative.  Negative for myalgias.  Skin: Negative.  Negative for rash.  Allergic/Immunologic: Negative.   Neurological: Positive for headaches. Negative for dizziness, tremors, weakness and light-headedness.  Hematological: Negative.  Negative for adenopathy. Does not bruise/bleed easily.  Psychiatric/Behavioral: Negative.        Objective:   Physical Exam    Constitutional: She is oriented to person, place, and time. She appears well-developed and well-nourished.  Non-toxic appearance. She does not have a sickly appearance. She does not appear ill. No distress.  HENT:  Head: Normocephalic and atraumatic.  Mouth/Throat: Oropharynx is clear and moist. No oropharyngeal exudate.  Eyes: Conjunctivae are normal. Right eye exhibits no discharge. Left eye exhibits no discharge. No scleral icterus.  Neck: Normal range of motion. Neck supple. No JVD present. No tracheal deviation present. No thyromegaly present.  Cardiovascular: Normal rate, regular rhythm, normal heart sounds and intact distal pulses.  Exam reveals no gallop and no friction rub.   No murmur heard. Pulmonary/Chest: Effort normal and breath sounds normal. No stridor. No respiratory distress. She has no wheezes. She has no rales. She exhibits no tenderness.  Abdominal: Soft. Bowel sounds are normal. She exhibits no distension and no mass. There is no tenderness. There is no rebound and no guarding.  Musculoskeletal: Normal range of motion. She exhibits no edema or tenderness.  Lymphadenopathy:    She has no cervical adenopathy.  Neurological: She is oriented to person, place, and time.  Skin: Skin is warm and dry. No rash noted. She is not diaphoretic. No erythema. No pallor.  Psychiatric: She has a normal mood and affect. Her behavior is normal. Judgment and thought content normal.  Vitals reviewed.   Lab Results  Component Value Date   WBC 5.6 07/24/2014   HGB 15.9* 07/24/2014   HCT 44.8 07/24/2014   PLT 194 07/24/2014   GLUCOSE 101* 07/24/2014   CHOL 216* 07/24/2014   TRIG 210* 07/24/2014   HDL 53 07/24/2014   LDLDIRECT 152.3 02/10/2012   LDLCALC 121* 07/24/2014   ALT 20 07/24/2014   AST 21 07/24/2014   NA 143 07/24/2014   K  4.1 07/24/2014   CL 104 07/24/2014   CREATININE 0.86 07/24/2014   BUN 21 07/24/2014   CO2 27 07/24/2014   TSH 1.583 07/24/2014   HGBA1C  05/15/2007     5.8 (NOTE)   The ADA recommends the following therapeutic goals for glycemic   control related to Hgb A1C measurement:   Goal of Therapy:   < 7.0% Hgb A1C   Action Suggested:  > 8.0% Hgb A1C   Ref:  Diabetes Care, 22, Suppl. 1, 1999        Assessment & Plan:

## 2014-11-13 NOTE — Assessment & Plan Note (Signed)
Her BP is well controlled 

## 2014-11-15 ENCOUNTER — Ambulatory Visit (INDEPENDENT_AMBULATORY_CARE_PROVIDER_SITE_OTHER): Payer: Medicare Other | Admitting: Internal Medicine

## 2014-11-15 ENCOUNTER — Encounter: Payer: Self-pay | Admitting: Internal Medicine

## 2014-11-15 ENCOUNTER — Telehealth: Payer: Self-pay

## 2014-11-15 VITALS — BP 126/64 | HR 68 | Ht 65.0 in | Wt 192.1 lb

## 2014-11-15 DIAGNOSIS — R05 Cough: Secondary | ICD-10-CM | POA: Diagnosis not present

## 2014-11-15 DIAGNOSIS — K219 Gastro-esophageal reflux disease without esophagitis: Secondary | ICD-10-CM

## 2014-11-15 DIAGNOSIS — R059 Cough, unspecified: Secondary | ICD-10-CM

## 2014-11-15 DIAGNOSIS — K222 Esophageal obstruction: Secondary | ICD-10-CM

## 2014-11-15 MED ORDER — RANITIDINE HCL 300 MG PO TABS: 300.0000 mg | ORAL_TABLET | Freq: Every morning | ORAL | Status: DC

## 2014-11-15 MED ORDER — HYDROCOD POLST-CHLORPHEN POLST 10-8 MG/5ML PO LQCR: ORAL | Status: DC

## 2014-11-15 MED ORDER — SUCRALFATE 1 GM/10ML PO SUSP: 1.0000 g | Freq: Two times a day (BID) | ORAL | Status: DC

## 2014-11-15 NOTE — Patient Instructions (Addendum)
We have sent the following medications to your pharmacy for you to pick up at your convenience:  Zantac and Carafate  You have been given an rx for Tussionex. Dr [name]

## 2014-11-15 NOTE — Progress Notes (Signed)
[name] 01-21-1933 [id number]  Note: This dictation was prepared with Dragon digital system. Any transcriptional errors that result from this procedure are unintentional.   History of Present Illness: This is a 81 year old white female with chronic gastroesophageal reflux and esophageal stricture which was dilated on upper endoscopy in November 2015 using  14, 15,16 , 17 and 18 mm dilators. She was started on omeprazole 40 mg daily but she feels that she was allergic to it. She apparently developed the pains in her shoulders and joints and was evaluated by a rheumatologist  Dr Waymon Budge all the tests were negative. She finally discontinued Prilosec and has put herself back on the ranitidine 300 mg in the morning which controls her reflux. Her joint pains improved but are still present in her knees. She denies any dysphagia since the dilatation. We have seen her in the past for sigmoid adenocarcinoma which was resected with the sigmoid resection in May 2008. She developed an anastomotic stricture which was dilated several times. Last colonoscopy in October 2012 showed only minimally narrowed sigmoid anastomosis at 20 cm. She has developed upper respiratory symptoms . Cough seemed to aggravate her stomach and she also has some drainage and congestion. She has completed course of antibiotics    Past Medical History  Diagnosis Date  . Arthritis   . Hypertension   . Iron deficiency anemia, unspecified   . Osteoporosis   . Colon cancer   . Unspecified vascular insufficiency of intestine   . HLD (hyperlipidemia)   . Coronary atherosclerosis of unspecified type of vessel, native or graft   . Personal history of other diseases of circulatory system   . Other and unspecified angina pectoris   . Atrial fibrillation   . Ischemic colitis   . Fatty liver 11/05/11  . Hiatal hernia   . Esophageal stricture     Past Surgical History  Procedure Laterality Date  . Tubal ligation    .  Appendectomy    . Sigmoid resection for invasive rectal adenocarcinoma  12/2006  . Abdominoperineal resection anastomotic stricture  05/2007  . Dilation and curettage of uterus  1992  . Rotator cuff repair  2006    left  . Mandibular renstruction    . Rt. salpingo oophorectomy and cyst removal  2005  . Wrist surgery  2008    right  . Esophagogastroduodenoscopy N/A 04/27/2014    Procedure: ESOPHAGOGASTRODUODENOSCOPY (EGD);  Surgeon: Lafayette Dragon, MD;  Location: [facility];  Service: Endoscopy;  Laterality: N/A;  . Savory dilation N/A 04/27/2014    Procedure: SAVORY DILATION;  Surgeon: Lafayette Dragon, MD;  Location: [facility];  Service: Endoscopy;  Laterality: N/A;  no xray needed    Allergies  Allergen Reactions  . Acetaminophen-Codeine   . Amlodipine Besylate     REACTION: swelling  . Diltiazem Hcl     REACTION: rash  . Hydrocodone-Acetaminophen     nausea  . Irbesartan-Hydrochlorothiazide     REACTION: Dizziness  . Lisinopril     cough  . Omeprazole Nausea Only and Other (See Comments)    Dizziness, joint pain, weakness in legs, cramps in legs, stomach burned  . Propoxyphene N-Acetaminophen     Headache, and blotchy face  . Valsartan     REACTION: blurred vision  . Warfarin Sodium     REACTION: body aches and bleeding  . Verapamil Nausea Only, Palpitations and Other (See Comments)    Headaches     Family history and social history have  been reviewed.  Review of Systems: Denies nausea vomiting or weight loss. Denies dysphagia positive for postnasal drip and nocturnal cough  The remainder of the 10 point ROS is negative except as outlined in the H&P  Physical Exam: General Appearance Well developed, in no distress Eyes  Non icteric  HEENT  Non traumatic, normocephalic  Mouth No lesion, tongue papillated, no cheilosis Neck Supple without adenopathy, thyroid not enlarged, no carotid bruits, no JVD Lungs Clear to auscultation bilaterally COR Normal S1, normal S2,  regular rhythm, no murmur, quiet precordium Abdomen: minimal tenderness in epigastrium. Well-healed surgical scars. Lower abdomen unremarkable Rectal not done Extremities  No pedal edema Skin No lesions Neurological Alert and oriented x 3 Psychological Normal mood and affect  Assessment and Plan:   81 year old white female developed  upper respiratory infection. Possible allergies. She is asking for something for cough and I will prescribe Tussinex. 1 teaspoon every 8-12 hours when necessary.This will help the GERD symptoms. Esophageal stricture dilated several months ago, currently asymptomatic Gastroesophageal reflux. Intolerance to Prilosec. Will stay on ranitidine 300 mg in the morning and possibly add 150 mg at night. 90 day supply of 300 mg ranitidine we will also add Carafate slurry 10 cc twice a day  Colorectal screening. At her age we have not scheduled her for recall colonoscopy, history of sigmoid carcinoma resected area normal colonoscopy recently     [name] 11/15/2014

## 2014-11-16 NOTE — Telephone Encounter (Signed)
Erroneous encounter

## 2015-01-23 DIAGNOSIS — M2550 Pain in unspecified joint: Secondary | ICD-10-CM | POA: Diagnosis not present

## 2015-01-23 DIAGNOSIS — M150 Primary generalized (osteo)arthritis: Secondary | ICD-10-CM | POA: Diagnosis not present

## 2015-01-23 DIAGNOSIS — M791 Myalgia: Secondary | ICD-10-CM | POA: Diagnosis not present

## 2015-01-23 DIAGNOSIS — M7712 Lateral epicondylitis, left elbow: Secondary | ICD-10-CM | POA: Diagnosis not present

## 2015-02-01 ENCOUNTER — Telehealth: Payer: Self-pay | Admitting: Cardiovascular Disease

## 2015-02-01 DIAGNOSIS — M79605 Pain in left leg: Principal | ICD-10-CM

## 2015-02-01 DIAGNOSIS — M79604 Pain in right leg: Secondary | ICD-10-CM

## 2015-02-01 NOTE — Telephone Encounter (Signed)
Returned call to patient spoke to [facility] he advised lower ext arterial dopplers and follow up appointment with him.Advised schedulers will call back to schedule appointments.

## 2015-02-01 NOTE — Telephone Encounter (Signed)
[name] is calling because she is having some pain in her legs , when she tries to execerise walk or a steady walk her legs get weak and wants to speak to someone about it . Please call  Thanks

## 2015-02-01 NOTE — Telephone Encounter (Signed)
Returned call to patient she stated she is having pain and weakness in both legs when she walks.Stated feels like legs will give away.Left leg seems worse.Also having cramping in both legs.Advised I will speak to Dr.Kelly this morning and call you back.

## 2015-02-02 ENCOUNTER — Telehealth (HOSPITAL_COMMUNITY): Payer: Self-pay | Admitting: *Deleted

## 2015-02-09 ENCOUNTER — Ambulatory Visit (HOSPITAL_COMMUNITY)
Admission: RE | Admit: 2015-02-09 | Discharge: 2015-02-09 | Disposition: A | Discharge status: Home / Self Care | Payer: Medicare Other | Source: Ambulatory Visit | Attending: Cardiology | Admitting: Cardiology

## 2015-02-09 DIAGNOSIS — M79605 Pain in left leg: Secondary | ICD-10-CM

## 2015-02-09 DIAGNOSIS — M79604 Pain in right leg: Secondary | ICD-10-CM | POA: Diagnosis not present

## 2015-02-23 ENCOUNTER — Encounter: Payer: Self-pay | Admitting: Internal Medicine

## 2015-02-23 ENCOUNTER — Ambulatory Visit (INDEPENDENT_AMBULATORY_CARE_PROVIDER_SITE_OTHER): Payer: Medicare Other | Admitting: Cardiovascular Disease

## 2015-02-23 ENCOUNTER — Encounter: Payer: Self-pay | Admitting: Cardiovascular Disease

## 2015-02-23 VITALS — BP 150/88 | HR 63 | Ht 66.0 in | Wt 198.0 lb

## 2015-02-23 DIAGNOSIS — I10 Essential (primary) hypertension: Secondary | ICD-10-CM | POA: Diagnosis not present

## 2015-02-23 DIAGNOSIS — M79606 Pain in leg, unspecified: Secondary | ICD-10-CM | POA: Diagnosis not present

## 2015-02-23 DIAGNOSIS — K219 Gastro-esophageal reflux disease without esophagitis: Secondary | ICD-10-CM

## 2015-02-23 DIAGNOSIS — I480 Paroxysmal atrial fibrillation: Secondary | ICD-10-CM

## 2015-02-23 DIAGNOSIS — R0602 Shortness of breath: Secondary | ICD-10-CM

## 2015-02-23 DIAGNOSIS — I70219 Atherosclerosis of native arteries of extremities with intermittent claudication, unspecified extremity: Secondary | ICD-10-CM | POA: Diagnosis not present

## 2015-02-23 DIAGNOSIS — E669 Obesity, unspecified: Secondary | ICD-10-CM

## 2015-02-23 MED ORDER — HYDRALAZINE HCL 25 MG PO TABS: 25.0000 mg | ORAL_TABLET | Freq: Two times a day (BID) | ORAL | Status: DC

## 2015-02-23 MED ORDER — EZETIMIBE 10 MG PO TABS: 10.0000 mg | ORAL_TABLET | Freq: Every day | ORAL | Status: DC

## 2015-02-23 MED ORDER — ASPIRIN 81 MG PO TABS: 81.0000 mg | ORAL_TABLET | Freq: Every day | ORAL | Status: DC

## 2015-02-23 NOTE — Patient Instructions (Addendum)
Your physician has recommended you make the following change in your medication:start new prescriptions for hydralazine and zetia as directed on the bottles. These prescriptions were sent to [facility]. Dr. Claiborne Billings also recommends that you begin taking aspirin 81 mg tablets daily.  Your physician has requested that you have a lexiscan myoview. For further information please visit [email or web address]. Please follow instruction sheet, as given.  You have been referred to Dr. Quay Burow to evaluate your legs.  Your physician recommends that you schedule a follow-up appointment with Dr. Claiborne Billings pending the results of your stress test.

## 2015-02-25 ENCOUNTER — Encounter: Payer: Self-pay | Admitting: Cardiovascular Disease

## 2015-02-25 DIAGNOSIS — I10 Essential (primary) hypertension: Secondary | ICD-10-CM | POA: Insufficient documentation

## 2015-02-25 DIAGNOSIS — K219 Gastro-esophageal reflux disease without esophagitis: Secondary | ICD-10-CM | POA: Insufficient documentation

## 2015-02-25 DIAGNOSIS — I70219 Atherosclerosis of native arteries of extremities with intermittent claudication, unspecified extremity: Secondary | ICD-10-CM | POA: Insufficient documentation

## 2015-02-25 NOTE — Progress Notes (Signed)
Patient ID: [name], female   DOB: [birth year], 81 y.o.   MRN: [id number]     HPI: [name] is a 81 y.o. female who presents for a 7 month cardiology evaluation.  [name] has a history of paroxysmal atrial fibrillation, hypertension, as well as hyperlipidemia. Remotely, she developed myalgias with simvastatin and had not been willing to try additional lipid lowering therapy. In August 2012 an echo Doppler study showed mild asymmetric LVH with proximal septal thickening without LVOT obstruction. She had grade 1 diastolic dysfunction with normal systolic function, mild MR, mild TR, and aortic valve sclerosis with mild MR.   In December 2014 follow-up blood work showed normal renal function with a BUN of 19, creatinine 0.8.  She continued to have significant hyperlipidemia with a total cholesterol of 250, triglycerides 221 HDL 53, VLDL 44 and LDL cholesterol 153.  TSH was normal at 2.275.   Since I last saw [name] she has noticed development of claudication, particularly involving her left leg with activity.  She also admits to expressing episodes of shortness of breath and fatigue.  She recently underwent a lower extremity arterial Doppler study which was abnormal and showed an ABI of 0.63 on the left and 1.0 on the right.  The left common iliac, external iliac, common femoral artery and profunda demonstrated multiphasic flow.  However, the superficial femoral artery on the left demonstrated occlusive disease in the proximal to mid thigh with reconstitution of flow in the mid distal segment.  The popliteal artery demonstrated patent monophasic flow with three-vessel runoff.  The anterior tibial artery demonstrated focal stenosis in the proximal calf with dampened flow distal to this.  The patient has multiple allergies and in the past did not tolerate ace inhibition or ARB therapy has been able to tolerate direct renin inhibition.  She has a history of GERD and has been taking  ranitidine and sucralfate.  She denies chest tightness but does admit to shortness of breath with activity as well as increasing fatigue.  She denies presyncope or syncope.  She denies PND or orthopnea.  She has not had recent blood work obtained.  She presents for evaluation    Past Medical History  Diagnosis Date  . Arthritis   . Hypertension   . Iron deficiency anemia, unspecified   . Osteoporosis   . Colon cancer   . Unspecified vascular insufficiency of intestine   . HLD (hyperlipidemia)   . Coronary atherosclerosis of unspecified type of vessel, native or graft   . Personal history of other diseases of circulatory system   . Other and unspecified angina pectoris   . Atrial fibrillation   . Ischemic colitis   . Fatty liver 11/05/11  . Hiatal hernia   . Esophageal stricture     Past Surgical History  Procedure Laterality Date  . Tubal ligation    . Appendectomy    . Sigmoid resection for invasive rectal adenocarcinoma  12/2006  . Abdominoperineal resection anastomotic stricture  05/2007  . Dilation and curettage of uterus  1992  . Rotator cuff repair  2006    left  . Mandibular renstruction    . Rt. salpingo oophorectomy and cyst removal  2005  . Wrist surgery  2008    right  . Esophagogastroduodenoscopy N/A 04/27/2014    Procedure: ESOPHAGOGASTRODUODENOSCOPY (EGD);  Surgeon: Lafayette Dragon, MD;  Location: [facility];  Service: Endoscopy;  Laterality: N/A;  . Savory dilation N/A 04/27/2014    Procedure: SAVORY  DILATION;  Surgeon: Lafayette Dragon, MD;  Location: [facility];  Service: Endoscopy;  Laterality: N/A;  no xray needed    Allergies  Allergen Reactions  . Acetaminophen-Codeine   . Amlodipine Besylate     REACTION: swelling  . Diltiazem Hcl     REACTION: rash  . Hydrocodone-Acetaminophen     nausea  . Irbesartan-Hydrochlorothiazide     REACTION: Dizziness  . Lisinopril     cough  . Omeprazole Nausea Only and Other (See Comments)    Dizziness, joint pain,  weakness in legs, cramps in legs, stomach burned  . Propoxyphene N-Acetaminophen     Headache, and blotchy face  . Valsartan     REACTION: blurred vision  . Warfarin Sodium     REACTION: body aches and bleeding  . Verapamil Nausea Only, Palpitations and Other (See Comments)    Headaches     Current Outpatient Prescriptions  Medication Sig Dispense Refill  . Aliskiren-Hydrochlorothiazide (TEKTURNA HCT) 300-25 MG TABS Take 1 tablet by mouth daily. 30 each 11  . Calcium Carbonate-Vitamin D (CALCIUM 600 + D PO) Take 2 tablets by mouth daily.    . chlorpheniramine-HYDROcodone (TUSSIONEX) 10-8 MG/5ML LQCR Take 1 teaspoon every 8-12 hours as needed for cough. 115 mL 0  . metoprolol succinate (TOPROL-XL) 50 MG 24 hr tablet TAKE 1/2 TABLET BY MOUTH EVERY MORNING AND TAKE 1 TABLET BY MOUTH EVERY EVENING 135 tablet 3  . metoprolol tartrate (LOPRESSOR) 25 MG tablet Take 25 mg by mouth as needed.     . Multiple Vitamin (MULTIVITAMIN PO) Take 1 tablet by mouth daily.      . Probiotic Product (ALIGN PO) Take 1 tablet by mouth daily.    . ranitidine (ZANTAC) 300 MG tablet Take 1 tablet (300 mg total) by mouth every morning. 90 tablet 3  . sucralfate (CARAFATE) 1 GM/10ML suspension Take 10 mLs (1 g total) by mouth 2 (two) times daily. 600 mL 0  . aspirin 81 MG tablet Take 1 tablet (81 mg total) by mouth daily. 30 tablet   . ezetimibe (ZETIA) 10 MG tablet Take 1 tablet (10 mg total) by mouth daily. 90 tablet 3  . hydrALAZINE (APRESOLINE) 25 MG tablet Take 1 tablet (25 mg total) by mouth 2 (two) times daily. 60 tablet 6   No current facility-administered medications for this visit.    History   Social History  . Marital Status: Widowed    Spouse Name: N/A  . Number of Children: 4  . Years of Education: N/A   Occupational History  . retired    Social History Main Topics  . Smoking status: Never Smoker   . Smokeless tobacco: Never Used  . Alcohol Use: No  . Drug Use: No  . Sexual Activity:  Not Currently   Other Topics Concern  . Not on file   Social History Narrative    Family History  Problem Relation Age of Onset  . Colon cancer Father 81  . Heart disease Father   . Hypertension Father   . Esophageal cancer Neg Hx   . Rectal cancer Neg Hx   . Stomach cancer Neg Hx   . Alzheimer's disease Mother   . Thyroid disease Mother   . Hypertension Sister   . Thyroid disease Sister   . Mitral valve prolapse Sister   . Hypertension Son   . Diabetes Son   . Hypertension Son   . Diabetes Son    Social history  is notable that  she is widowed. She has 3 children and 9 grandchildren. She remains active. There is no alcohol tobacco use.   ROS General: Negative; No fevers, chills, or night sweats; positive for fatigue and shortness of breath HEENT: Negative; No changes in vision or hearing, sinus congestion, difficulty swallowing Pulmonary: Negative; No cough, wheezing, hemoptysis Cardiovascular: Negative; No chest pain, presyncope, syncope, palpitations Positive for left leg claudication GI: Positive for GERD GU: Negative; No dysuria, hematuria, or difficulty voiding Musculoskeletal: Negative; no myalgias, joint pain, or weakness Hematologic/Oncology: Negative; no easy bruising, bleeding Endocrine: Negative; no heat/cold intolerance; no diabetes Neuro: Negative; no changes in balance, headaches Skin: Negative; No rashes or skin lesions Psychiatric: Negative; No behavioral problems, depression Sleep: Negative; No snoring, daytime sleepiness, hypersomnolence, bruxism, restless legs, hypnogognic hallucinations, no cataplexy Other comprehensive 14 point system review is negative.   PE BP 150/88 mmHg  Pulse 63  Ht 5' 6"  (1.676 m)  Wt 198 lb (89.812 kg)  BMI 31.97 kg/m2  Repeat blood pressure by me was 168/88 in the right arm and 170/86 in the left arm.  Wt Readings from Last 3 Encounters:  02/23/15 198 lb (89.812 kg)  11/15/14 192 lb 2 oz (87.147 kg)  11/10/14 193  lb (87.544 kg)   General: Alert, oriented, no distress.  Skin: normal turgor, no rashes HEENT: Normocephalic, atraumatic. Pupils round and reactive; sclera anicteric;no lid lag.  Nose without nasal septal hypertrophy Mouth/Parynx benign; Mallinpatti scale 2 Neck: No JVD, no carotid bruits with normal carotid upstroke Chest wall: Nontender to palpation Lungs: clear to ausculatation and percussion; no wheezing or rales Heart: RRR, s1 s2 normal 1/6 systolic murmur, with a faint aortic insufficiency murmur; .  No S3 gallop.  No diastolic murmur rubs thrills or heaves. Abdomen: soft, nontender; no hepatosplenomehaly, BS+; abdominal aorta nontender and not dilated by palpation. Back: No CVA tenderness Pulses 2+ , except diminished in the left lower extremity Extremities: no clubbing cyanosis or edema, Homan's sign negative  Neurologic: grossly nonfocal Psychologic: normal affect and mood.  ECG (independently read by me): Normal sinus rhythm at 63 bpm.  Poor precordial R-wave progression.  No ectopy.  November 2015 ECG (independently read by me);  Normal sinus rhythm at 59 bpm.  QTc interval 415 ms.  No significant ST segment changes.  Prior November 2014ECG: Sinus rhythm at 63 beats per minute. Normal intervals.  LABS: BMP Latest Ref Rng 07/24/2014 08/08/2013 03/18/2012  Glucose 70 - 99 mg/dL 101(H) 101(H) 87  BUN 6 - 23 mg/dL 21 19 24(H)  Creatinine 0.50 - 1.10 mg/dL 0.86 0.87 0.89  Sodium 135 - 145 mEq/L 143 141 143  Potassium 3.5 - 5.3 mEq/L 4.1 4.3 3.9  Chloride 96 - 112 mEq/L 104 103 105  CO2 19 - 32 mEq/L 27 30 29   Calcium 8.4 - 10.5 mg/dL 10.1 10.0 9.7   Hepatic Function Latest Ref Rng 07/24/2014 08/08/2013 03/18/2012  Total Protein 6.0 - 8.3 g/dL 7.2 6.9 6.7  Albumin 3.5 - 5.2 g/dL 4.4 4.4 4.3  AST 0 - 37 U/L 21 21 23   ALT 0 - 35 U/L 20 22 18   Alk Phosphatase 39 - 117 U/L 58 67 52  Total Bilirubin 0.2 - 1.2 mg/dL 1.0 0.8 0.9  Bilirubin, Direct 0.0 - 0.3 mg/dL - - -   CBC  Latest Ref Rng 07/24/2014 08/08/2013 03/18/2012  WBC 4.0 - 10.5 K/uL 5.6 4.3 5.1  Hemoglobin 12.0 - 15.0 g/dL 15.9(H) 15.3(H) 15.0  Hematocrit 36.0 - 46.0 % 44.8 44.0  43.7  Platelets 150 - 400 K/uL 194 197 158   Lab Results  Component Value Date   MCV 87.0 07/24/2014   MCV 85.9 08/08/2013   MCV 90 03/18/2012   Lab Results  Component Value Date   TSH 1.583 07/24/2014   Lab Results  Component Value Date   HGBA1C  05/15/2007    5.8 (NOTE)   The ADA recommends the following therapeutic goals for glycemic   control related to Hgb A1C measurement:   Goal of Therapy:   < 7.0% Hgb A1C   Action Suggested:  > 8.0% Hgb A1C   Ref:  Diabetes Care, 22, Suppl. 1, 1999   Lipid Panel     Component Value Date/Time   CHOL 216* 07/24/2014 1027   TRIG 210* 07/24/2014 1027   HDL 53 07/24/2014 1027   CHOLHDL 4.1 07/24/2014 1027   VLDL 42* 07/24/2014 1027   LDLCALC 121* 07/24/2014 1027   LDLDIRECT 152.3 02/10/2012 1010    RADIOLOGY: US Breast Right  07/04/2013   CLINICAL DATA:  Abnormal screening right mammogram.  EXAM: DIGITAL DIAGNOSTIC  right MAMMOGRAM  ULTRASOUND right BREAST  COMPARISON:  With prior exams.  ACR Breast Density Category b: There are scattered areas of fibroglandular density.  FINDINGS: Spot compression views of the lateral aspect of the right breast were performed. There is persistence of a 4 mm low-density nodule in the posterior 3rd of the breast. There are no malignant type microcalcifications.  On physical exam I do not palpate a mass in the right breast.  Ultrasound is performed, showing there is a near anechoic lesion in the right breast at 7 o'clock 8 cm from the nipple measuring 3 x 2 x 3 mm.  IMPRESSION: Probable benign lesion in the right breast.  RECOMMENDATION: Short-term interval followup right mammogram and ultrasound in 6 months is recommended to document stability.  I have discussed the findings and recommendations with the patient. Results were also provided in writing  at the conclusion of the visit. If applicable, a reminder letter will be sent to the patient regarding the next appointment.  BI-RADS CATEGORY  3: Probably benign finding(s) - short interval follow-up suggested.   Electronically Signed   By: Lillia Mountain M.D.   On: 07/04/2013 09:01   Mm [place] R  07/04/2013   CLINICAL DATA:  Abnormal screening right mammogram.  EXAM: DIGITAL DIAGNOSTIC  right MAMMOGRAM  ULTRASOUND right BREAST  COMPARISON:  With prior exams.  ACR Breast Density Category b: There are scattered areas of fibroglandular density.  FINDINGS: Spot compression views of the lateral aspect of the right breast were performed. There is persistence of a 4 mm low-density nodule in the posterior 3rd of the breast. There are no malignant type microcalcifications.  On physical exam I do not palpate a mass in the right breast.  Ultrasound is performed, showing there is a near anechoic lesion in the right breast at 7 o'clock 8 cm from the nipple measuring 3 x 2 x 3 mm.  IMPRESSION: Probable benign lesion in the right breast.  RECOMMENDATION: Short-term interval followup right mammogram and ultrasound in 6 months is recommended to document stability.  I have discussed the findings and recommendations with the patient. Results were also provided in writing at the conclusion of the visit. If applicable, a reminder letter will be sent to the patient regarding the next appointment.  BI-RADS CATEGORY  3: Probably benign finding(s) - short interval follow-up suggested.   Electronically Signed  By: Lillia Mountain M.D.   On: 07/04/2013 09:01       ASSESSMENT AND PLAN: [name] is an 81 year old female who has a history of hypertension and remote history of paroxysmal atrial fibrillation in addition to hyperlipidemia.  She is mildly obese with a body mass index of 31.97 kg/m.  She is maintaining sinus rhythm without recurrent atrial fibrillation.  Her blood pressure today is elevated on her current dose of  Tekturna HCT 300/25.  In the past she has been intolerant to ARB therapy as well as ACE inhibitor but seems to tolerate direct renin inhibition.  I am adding hydralazine 25 mg twice a day to her medical regimen from her optimal blood pressure control.  She has a history of significant hyperlipidemia and has not been able to tolerate statins.  In the past I had discussed Zetia but she was hesitant to institute therapy.  I have now discussed this again with her in detail and particularly with evidence for vascular disease.  She has now agreed to institute Zetia 10 mg.  I had long discussion with her and reviewed her most recent lower extremity arterial  Doppler study.  This suggests an obstruction with occlusive disease involving the superior femoral artery.  I have recommended institution of baby aspirin 81 mg and also discussed instituting Plavix.  She does not prefer to start the Plavix at this time. With her exertional shortness of breath with more fatigue and her peripheral vascular disease I am scheduling her for Carpio study to make certain she is not having ischemia mediated dyspnea.  I am referring her to Dr. Gwenlyn Found for PV evaluation.  I discussed the probable need to undergo PV angiogram for further evaluation and treatment of her PVD.  However, if her Carlton Adam study is abnormal further cardiac evaluation for CAD will be necessary.  Laboratory will be recommended.   Time spent: 40 minutes  Troy Sine, MD, [facility]  02/25/2015 11:59 AM

## 2015-03-01 ENCOUNTER — Ambulatory Visit: Payer: Medicare Other | Admitting: Cardiovascular Disease

## 2015-03-07 ENCOUNTER — Telehealth (HOSPITAL_COMMUNITY): Payer: Self-pay

## 2015-03-07 NOTE — Telephone Encounter (Signed)
Encounter complete. 

## 2015-03-08 ENCOUNTER — Telehealth (HOSPITAL_COMMUNITY): Payer: Self-pay

## 2015-03-08 NOTE — Telephone Encounter (Signed)
Encounter complete. 

## 2015-03-09 ENCOUNTER — Encounter: Payer: Self-pay | Admitting: Cardiovascular Disease

## 2015-03-09 ENCOUNTER — Ambulatory Visit
Admission: RE | Admit: 2015-03-09 | Discharge: 2015-03-09 | Disposition: A | Discharge status: Home / Self Care | Payer: Medicare Other | Source: Ambulatory Visit | Attending: Cardiovascular Disease | Admitting: Cardiovascular Disease

## 2015-03-09 ENCOUNTER — Ambulatory Visit: Payer: Medicare Other | Admitting: Cardiovascular Disease

## 2015-03-09 ENCOUNTER — Ambulatory Visit (INDEPENDENT_AMBULATORY_CARE_PROVIDER_SITE_OTHER): Payer: Medicare Other | Admitting: Cardiovascular Disease

## 2015-03-09 ENCOUNTER — Ambulatory Visit (HOSPITAL_COMMUNITY)
Admission: RE | Admit: 2015-03-09 | Discharge: 2015-03-09 | Disposition: A | Discharge status: Home / Self Care | Payer: Medicare Other | Source: Ambulatory Visit | Attending: Cardiology | Admitting: Cardiology

## 2015-03-09 VITALS — BP 118/60 | HR 76 | Ht 66.0 in | Wt 198.5 lb

## 2015-03-09 DIAGNOSIS — I739 Peripheral vascular disease, unspecified: Secondary | ICD-10-CM

## 2015-03-09 DIAGNOSIS — R5381 Other malaise: Secondary | ICD-10-CM | POA: Diagnosis not present

## 2015-03-09 DIAGNOSIS — Z01810 Encounter for preprocedural cardiovascular examination: Secondary | ICD-10-CM | POA: Diagnosis not present

## 2015-03-09 DIAGNOSIS — I10 Essential (primary) hypertension: Secondary | ICD-10-CM | POA: Diagnosis not present

## 2015-03-09 DIAGNOSIS — R0602 Shortness of breath: Secondary | ICD-10-CM | POA: Insufficient documentation

## 2015-03-09 DIAGNOSIS — Z01818 Encounter for other preprocedural examination: Secondary | ICD-10-CM

## 2015-03-09 DIAGNOSIS — D689 Coagulation defect, unspecified: Secondary | ICD-10-CM

## 2015-03-09 DIAGNOSIS — Z8249 Family history of ischemic heart disease and other diseases of the circulatory system: Secondary | ICD-10-CM | POA: Insufficient documentation

## 2015-03-09 DIAGNOSIS — R079 Chest pain, unspecified: Secondary | ICD-10-CM | POA: Insufficient documentation

## 2015-03-09 DIAGNOSIS — R9439 Abnormal result of other cardiovascular function study: Secondary | ICD-10-CM | POA: Diagnosis not present

## 2015-03-09 DIAGNOSIS — I4891 Unspecified atrial fibrillation: Secondary | ICD-10-CM | POA: Insufficient documentation

## 2015-03-09 DIAGNOSIS — I2510 Atherosclerotic heart disease of native coronary artery without angina pectoris: Secondary | ICD-10-CM | POA: Insufficient documentation

## 2015-03-09 DIAGNOSIS — R5383 Other fatigue: Secondary | ICD-10-CM | POA: Insufficient documentation

## 2015-03-09 DIAGNOSIS — I340 Nonrheumatic mitral (valve) insufficiency: Secondary | ICD-10-CM | POA: Insufficient documentation

## 2015-03-09 DIAGNOSIS — I70219 Atherosclerosis of native arteries of extremities with intermittent claudication, unspecified extremity: Secondary | ICD-10-CM | POA: Diagnosis not present

## 2015-03-09 LAB — BASIC METABOLIC PANEL
BUN: 24 mg/dL — AB (ref 6–23)
CO2: 29 mEq/L (ref 19–32)
Calcium: 9.7 mg/dL (ref 8.4–10.5)
Chloride: 102 mEq/L (ref 96–112)
Creat: 0.98 mg/dL (ref 0.50–1.10)
Glucose, Bld: 100 mg/dL — ABNORMAL HIGH (ref 70–99)
Potassium: 3.8 mEq/L (ref 3.5–5.3)
SODIUM: 142 meq/L (ref 135–145)

## 2015-03-09 LAB — CBC
HCT: 43.9 % (ref 36.0–46.0)
Hemoglobin: 15.2 g/dL — ABNORMAL HIGH (ref 12.0–15.0)
MCH: 31.1 pg (ref 26.0–34.0)
MCHC: 34.6 g/dL (ref 30.0–36.0)
MCV: 89.8 fL (ref 78.0–100.0)
MPV: 9.5 fL (ref 8.6–12.4)
PLATELETS: 192 10*3/uL (ref 150–400)
RBC: 4.89 MIL/uL (ref 3.87–5.11)
RDW: 14 % (ref 11.5–15.5)
WBC: 6.1 10*3/uL (ref 4.0–10.5)

## 2015-03-09 LAB — MYOCARDIAL PERFUSION IMAGING
CHL CUP NUCLEAR SDS: 2
CHL CUP RESTING HR STRESS: 60 {beats}/min
LV dias vol: 65 mL
LVSYSVOL: 19 mL
NUC STRESS TID: 1.15
Peak HR: 88 {beats}/min
SRS: 0
SSS: 2

## 2015-03-09 LAB — TSH: TSH: 1.663 u[IU]/mL (ref 0.350–4.500)

## 2015-03-09 MED ORDER — TECHNETIUM TC 99M SESTAMIBI GENERIC - CARDIOLITE: 30.2000 | Freq: Once | INTRAVENOUS | Status: AC | PRN

## 2015-03-09 MED ORDER — REGADENOSON 0.4 MG/5ML IV SOLN: 0.4000 mg | Freq: Once | INTRAVENOUS | Status: DC

## 2015-03-09 MED ORDER — AMINOPHYLLINE 25 MG/ML IV SOLN: 75.0000 mg | Freq: Once | INTRAVENOUS | Status: DC

## 2015-03-09 MED ORDER — TECHNETIUM TC 99M SESTAMIBI GENERIC - CARDIOLITE: 10.2000 | Freq: Once | INTRAVENOUS | Status: AC | PRN

## 2015-03-09 NOTE — Progress Notes (Signed)
03/09/2015 [name]   23-Aug-1933  [id number]  Primary Physician [name], MD Primary Cardiologist: Lorretta Harp MD Renae Gloss   HPI:  [name] is a delightful 81 year old fit-appearing widowed Caucasian female mother of 4 children, grandmother of 45 grandchildren for by Dr. Ellouise Newer for peripheral vascular evaluation because of lifestyle limiting left lower extremity claudication. She has a history of hypertension and hyperlipidemia as well as PAF in the past. She's had a cath remotely that was on room remarkable. She's had increasing dyspnea on exertion from IV stress test ordered by Dr. Claiborne Billings performed today showed mild anteroseptal and inferior ischemia. Lower extremity Dopplers showed a right ABI 0.63 with SFA disease.   Current Outpatient Prescriptions  Medication Sig Dispense Refill  . Aliskiren-Hydrochlorothiazide (TEKTURNA HCT) 300-25 MG TABS Take 1 tablet by mouth daily. 30 each 11  . aspirin 81 MG tablet Take 1 tablet (81 mg total) by mouth daily. 30 tablet   . Calcium Carbonate-Vitamin D (CALCIUM 600 + D PO) Take 2 tablets by mouth daily.    Marland Kitchen ezetimibe (ZETIA) 10 MG tablet Take 1 tablet (10 mg total) by mouth daily. 90 tablet 3  . hydrALAZINE (APRESOLINE) 25 MG tablet Take 1 tablet (25 mg total) by mouth 2 (two) times daily. 60 tablet 6  . metoprolol succinate (TOPROL-XL) 50 MG 24 hr tablet TAKE 1/2 TABLET BY MOUTH EVERY MORNING AND TAKE 1 TABLET BY MOUTH EVERY EVENING 135 tablet 3  . metoprolol tartrate (LOPRESSOR) 25 MG tablet Take 25 mg by mouth as needed.     . Multiple Vitamin (MULTIVITAMIN PO) Take 1 tablet by mouth daily.      . Probiotic Product (ALIGN PO) Take 1 tablet by mouth daily.    . ranitidine (ZANTAC) 300 MG tablet Take 1 tablet (300 mg total) by mouth every morning. 90 tablet 3   No current facility-administered medications for this visit.   Facility-Administered Medications Ordered in Other Visits  Medication Dose Route  Frequency Provider Last Rate Last Dose  . aminophylline injection 75 mg  75 mg Intravenous Once Peter M Martinique, MD      . regadenoson Carlton Adam) injection SOLN 0.4 mg  0.4 mg Intravenous Once Peter M Martinique, MD      . technetium sestamibi generic (CARDIOLITE) injection 10 milli Curie  10 milli Curie Intravenous Once PRN Peter M Martinique, MD      . technetium sestamibi generic (CARDIOLITE) injection 30.2 milli Curie  30.2 milli Curie Intravenous Once PRN Peter M Martinique, MD        Allergies  Allergen Reactions  . Acetaminophen-Codeine   . Amlodipine Besylate     REACTION: swelling  . Diltiazem Hcl     REACTION: rash  . Hydrocodone-Acetaminophen     nausea  . Irbesartan-Hydrochlorothiazide     REACTION: Dizziness  . Lisinopril     cough  . Omeprazole Nausea Only and Other (See Comments)    Dizziness, joint pain, weakness in legs, cramps in legs, stomach burned  . Propoxyphene N-Acetaminophen     Headache, and blotchy face  . Valsartan     REACTION: blurred vision  . Warfarin Sodium     REACTION: body aches and bleeding  . Verapamil Nausea Only, Palpitations and Other (See Comments)    Headaches     History   Social History  . Marital Status: Widowed    Spouse Name: N/A  . Number of Children: 4  . Years of Education:  N/A   Occupational History  . retired    Social History Main Topics  . Smoking status: Never Smoker   . Smokeless tobacco: Never Used  . Alcohol Use: No  . Drug Use: No  . Sexual Activity: Not Currently   Other Topics Concern  . Not on file   Social History Narrative     Review of Systems: General: negative for chills, fever, night sweats or weight changes.  Cardiovascular: negative for chest pain, dyspnea on exertion, edema, orthopnea, palpitations, paroxysmal nocturnal dyspnea or shortness of breath Dermatological: negative for rash Respiratory: negative for cough or wheezing Urologic: negative for hematuria Abdominal: negative for nausea,  vomiting, diarrhea, bright red blood per rectum, melena, or hematemesis Neurologic: negative for visual changes, syncope, or dizziness All other systems reviewed and are otherwise negative except as noted above.    Blood pressure 118/60, pulse 76, height 5\' 6"  (1.676 m), weight 198 lb 8 oz (90.039 kg).  General appearance: alert and no distress Neck: no adenopathy, no carotid bruit, no JVD, supple, symmetrical, trachea midline and thyroid not enlarged, symmetric, no tenderness/mass/nodules Lungs: clear to auscultation bilaterally Heart: regular rate and rhythm, S1, S2 normal, no murmur, click, rub or gallop Extremities: 1+ right pedal pulse absent left pedal pulse  EKG not performed today  ASSESSMENT AND PLAN:   Claudication Miss Buntin is an 81 year old female referred to me by Dr. Claiborne Billings for peripheral vascular evaluation because of lifestyle limiting left lower extremity claudication. She had lower extremity R till Doppler studies performed 02/09/15 revealing a right ABI 0.634 appears to be SFA disease. Based on this, we decided to proceed with peripheral angiography potential and prevention. She did have a Myoview stress test performed by Dr. Claiborne Billings because of shortness of breath and fatigue. This showed mild antero-septal and inferior ischemia. Based on this, I will arrange for her to undergo outpatient diagnostic coronary arteriography by Dr. Claiborne Billings to further define her coronary anatomy and risk stratify her. After that we will then proceed with evaluation of her peripheral vascular disease.      Lorretta Harp MD FACP,FACC,FAHA, [facility] 03/09/2015 2:42 PM

## 2015-03-09 NOTE — Assessment & Plan Note (Signed)
[name] is an 81 year old female referred to me by Dr. Claiborne Billings for peripheral vascular evaluation because of lifestyle limiting left lower extremity claudication. She had lower extremity R till Doppler studies performed 02/09/15 revealing a right ABI 0.634 appears to be SFA disease. Based on this, we decided to proceed with peripheral angiography potential and prevention. She did have a Myoview stress test performed by Dr. Claiborne Billings because of shortness of breath and fatigue. This showed mild antero-septal and inferior ischemia. Based on this, I will arrange for her to undergo outpatient diagnostic coronary arteriography by Dr. Claiborne Billings to further define her coronary anatomy and risk stratify her. After that we will then proceed with evaluation of her peripheral vascular disease.

## 2015-03-09 NOTE — Patient Instructions (Signed)
Dr Gwenlyn Found has requested that you have a cardiac catheterization with Dr Claiborne Billings on Tuesday, July 12th. Cardiac catheterization is used to diagnose and/or treat various heart conditions. Doctors may recommend this procedure for a number of different reasons. The most common reason is to evaluate chest pain. Chest pain can be a symptom of coronary artery disease (CAD), and cardiac catheterization can show whether plaque is narrowing or blocking your heart's arteries. This procedure is also used to evaluate the valves, as well as measure the blood flow and oxygen levels in different parts of your heart. For further information please visit [email or web address]. Please follow instruction sheet, as given.  Dr Gwenlyn Found has ordered you to have some blood work/chest xray prior to your procedure. The scheduler will tell you when to get this done. Please have your blood work done at [facility]. There is one on the first floor of this building and one on [place] ([address] phone-[phone])  Dr. Gwenlyn Found has ordered a peripheral angiogram to be done at [facility] the first week of August.  This procedure is going to look at the bloodflow in your lower extremities.  If Dr. Gwenlyn Found is able to open up the arteries, you will have to spend one night in the hospital.  If he is not able to open the arteries, you will be able to go home that same day.    After the procedure, you will not be allowed to drive for 3 days or push, pull, or lift anything greater than 10 lbs for one week.    *REPS Scott   Right groin

## 2015-03-10 LAB — APTT: APTT: 29 s (ref 24–37)

## 2015-03-10 LAB — PROTIME-INR
INR: 0.94
Prothrombin Time: 12.6 s (ref 11.6–15.2)

## 2015-03-12 ENCOUNTER — Encounter: Payer: Self-pay | Admitting: *Deleted

## 2015-03-13 ENCOUNTER — Encounter (HOSPITAL_COMMUNITY)
Admission: RE | Disposition: A | Discharge status: Home / Self Care | Payer: Medicare Other | Source: Ambulatory Visit | Attending: Cardiovascular Disease

## 2015-03-13 ENCOUNTER — Ambulatory Visit (HOSPITAL_COMMUNITY)
Admission: RE | Admit: 2015-03-13 | Discharge: 2015-03-13 | Disposition: A | Discharge status: Home / Self Care | Payer: Medicare Other | Source: Ambulatory Visit | Attending: Cardiovascular Disease | Admitting: Cardiovascular Disease

## 2015-03-13 DIAGNOSIS — I10 Essential (primary) hypertension: Secondary | ICD-10-CM | POA: Diagnosis not present

## 2015-03-13 DIAGNOSIS — I739 Peripheral vascular disease, unspecified: Secondary | ICD-10-CM | POA: Diagnosis not present

## 2015-03-13 DIAGNOSIS — E785 Hyperlipidemia, unspecified: Secondary | ICD-10-CM | POA: Insufficient documentation

## 2015-03-13 DIAGNOSIS — I350 Nonrheumatic aortic (valve) stenosis: Secondary | ICD-10-CM | POA: Diagnosis not present

## 2015-03-13 DIAGNOSIS — I480 Paroxysmal atrial fibrillation: Secondary | ICD-10-CM | POA: Insufficient documentation

## 2015-03-13 DIAGNOSIS — R9439 Abnormal result of other cardiovascular function study: Secondary | ICD-10-CM | POA: Insufficient documentation

## 2015-03-13 DIAGNOSIS — Z7982 Long term (current) use of aspirin: Secondary | ICD-10-CM | POA: Insufficient documentation

## 2015-03-13 DIAGNOSIS — I2510 Atherosclerotic heart disease of native coronary artery without angina pectoris: Secondary | ICD-10-CM | POA: Insufficient documentation

## 2015-03-13 HISTORY — PX: CARDIAC CATHETERIZATION: SHX172

## 2015-03-13 SURGERY — LEFT HEART CATH AND CORONARY ANGIOGRAPHY
Anesthesia: LOCAL

## 2015-03-13 MED ORDER — HEPARIN SODIUM (PORCINE) 1000 UNIT/ML IJ SOLN
INTRAMUSCULAR | Status: DC | PRN
  Administered 2015-03-13: 4300 [IU] via INTRAVENOUS

## 2015-03-13 MED ORDER — SODIUM CHLORIDE 0.9 % IV SOLN: INTRAVENOUS | Status: DC

## 2015-03-13 MED ORDER — ASPIRIN 81 MG PO CHEW: 81.0000 mg | CHEWABLE_TABLET | Freq: Every day | ORAL | Status: DC

## 2015-03-13 MED ORDER — SODIUM CHLORIDE 0.9 % WEIGHT BASED INFUSION
3.0000 mL/kg/h | INTRAVENOUS | Status: AC
  Administered 2015-03-13: 3 mL/kg/h via INTRAVENOUS

## 2015-03-13 MED ORDER — RADIAL COCKTAIL (HEPARIN/VERAPAMIL/LIDOCAINE/NITRO)
Status: DC | PRN
  Administered 2015-03-13: 1 via INTRA_ARTERIAL

## 2015-03-13 MED ORDER — NITROGLYCERIN 1 MG/10 ML FOR IR/CATH LAB
INTRA_ARTERIAL | Status: AC
Start: 2015-03-13 — End: 2015-03-13
  Filled 2015-03-13: qty 10

## 2015-03-13 MED ORDER — SODIUM CHLORIDE 0.9 % IJ SOLN: 3.0000 mL | INTRAMUSCULAR | Status: DC | PRN

## 2015-03-13 MED ORDER — VERAPAMIL HCL 2.5 MG/ML IV SOLN
INTRAVENOUS | Status: AC
  Filled 2015-03-13: qty 2

## 2015-03-13 MED ORDER — MIDAZOLAM HCL 2 MG/2ML IJ SOLN
INTRAMUSCULAR | Status: DC | PRN
  Administered 2015-03-13 (×2): 1 mg via INTRAVENOUS

## 2015-03-13 MED ORDER — SODIUM CHLORIDE 0.9 % IV SOLN: 250.0000 mL | INTRAVENOUS | Status: DC | PRN

## 2015-03-13 MED ORDER — HEPARIN (PORCINE) IN NACL 2-0.9 UNIT/ML-% IJ SOLN
INTRAMUSCULAR | Status: AC
  Filled 2015-03-13: qty 1000

## 2015-03-13 MED ORDER — FENTANYL CITRATE (PF) 100 MCG/2ML IJ SOLN
INTRAMUSCULAR | Status: DC | PRN
  Administered 2015-03-13 (×2): 25 ug via INTRAVENOUS

## 2015-03-13 MED ORDER — MIDAZOLAM HCL 2 MG/2ML IJ SOLN
INTRAMUSCULAR | Status: AC
  Filled 2015-03-13: qty 2

## 2015-03-13 MED ORDER — HEPARIN SODIUM (PORCINE) 1000 UNIT/ML IJ SOLN
INTRAMUSCULAR | Status: AC
  Filled 2015-03-13: qty 1

## 2015-03-13 MED ORDER — LIDOCAINE HCL (PF) 1 % IJ SOLN
INTRAMUSCULAR | Status: AC
  Filled 2015-03-13: qty 30

## 2015-03-13 MED ORDER — SODIUM CHLORIDE 0.9 % IJ SOLN: 3.0000 mL | Freq: Two times a day (BID) | INTRAMUSCULAR | Status: DC

## 2015-03-13 MED ORDER — ASPIRIN 81 MG PO CHEW
81.0000 mg | CHEWABLE_TABLET | ORAL | Status: DC
Start: 2015-03-13 — End: 2015-03-13

## 2015-03-13 MED ORDER — SODIUM CHLORIDE 0.9 % WEIGHT BASED INFUSION: 1.0000 mL/kg/h | INTRAVENOUS | Status: DC

## 2015-03-13 MED ORDER — ONDANSETRON HCL 4 MG/2ML IJ SOLN: 4.0000 mg | Freq: Four times a day (QID) | INTRAMUSCULAR | Status: DC | PRN

## 2015-03-13 MED ORDER — IOHEXOL 350 MG/ML SOLN
INTRAVENOUS | Status: DC | PRN
  Administered 2015-03-13: 55 mL via INTRACARDIAC

## 2015-03-13 MED ORDER — FENTANYL CITRATE (PF) 100 MCG/2ML IJ SOLN
INTRAMUSCULAR | Status: AC
  Filled 2015-03-13: qty 2

## 2015-03-13 SURGICAL SUPPLY — 11 items
CATH INFINITI 5FR ANG PIGTAIL (CATHETERS) ×3 IMPLANT
CATH OPTITORQUE TIG 4.0 5F (CATHETERS) ×3 IMPLANT
DEVICE RAD COMP TR BAND LRG (VASCULAR PRODUCTS) ×3 IMPLANT
GLIDESHEATH SLEND A-KIT 6F 22G (SHEATH) ×3 IMPLANT
KIT HEART LEFT (KITS) ×3 IMPLANT
PACK CARDIAC CATHETERIZATION (CUSTOM PROCEDURE TRAY) ×3 IMPLANT
SYR MEDRAD MARK V 150ML (SYRINGE) ×3 IMPLANT
TRANSDUCER W/STOPCOCK (MISCELLANEOUS) ×3 IMPLANT
TUBING CIL FLEX 10 FLL-RA (TUBING) ×3 IMPLANT
WIRE HI TORQ VERSACORE-J 145CM (WIRE) ×2 IMPLANT
WIRE SAFE-T 1.5MM-J .035X260CM (WIRE) ×3 IMPLANT

## 2015-03-13 NOTE — Interval H&P Note (Signed)
Cath Lab Visit (complete for each Cath Lab visit)  Clinical Evaluation Leading to the Procedure:   ACS: No.  Non-ACS:    Anginal Classification: CCS III  Anti-ischemic medical therapy: Minimal Therapy (1 class of medications)  Non-Invasive Test Results: Low-risk stress test findings: cardiac mortality <1%/year  Prior CABG: No previous CABG      History and Physical Interval Note:  03/13/2015 2:04 PM  [name]  has presented today for surgery, with the diagnosis of abnormal stress test  The various methods of treatment have been discussed with the patient and family. After consideration of risks, benefits and other options for treatment, the patient has consented to  Procedure(s): Left Heart Cath and Coronary Angiography (N/A) as a surgical intervention .  The patient's history has been reviewed, patient examined, no change in status, stable for surgery.  I have reviewed the patient's chart and labs.  Questions were answered to the patient's satisfaction.     [name] A

## 2015-03-13 NOTE — H&P (View-Only) (Signed)
03/09/2015 Vernesha Talbot Kandler   Jan 10, 1933  [id number]  Primary Physician [name], MD Primary Cardiologist: Lorretta Harp MD Renae Gloss   HPI:  Yailene Badia is a delightful 81 year old fit-appearing widowed Caucasian female mother of 4 children, grandmother of 22 grandchildren for by Dr. Ellouise Newer for peripheral vascular evaluation because of lifestyle limiting left lower extremity claudication. She has a history of hypertension and hyperlipidemia as well as PAF in the past. She's had a cath remotely that was on room remarkable. She's had increasing dyspnea on exertion from IV stress test ordered by Dr. Claiborne Billings performed today showed mild anteroseptal and inferior ischemia. Lower extremity Dopplers showed a right ABI 0.63 with SFA disease.   Current Outpatient Prescriptions  Medication Sig Dispense Refill  . Aliskiren-Hydrochlorothiazide (TEKTURNA HCT) 300-25 MG TABS Take 1 tablet by mouth daily. 30 each 11  . aspirin 81 MG tablet Take 1 tablet (81 mg total) by mouth daily. 30 tablet   . Calcium Carbonate-Vitamin D (CALCIUM 600 + D PO) Take 2 tablets by mouth daily.    Marland Kitchen ezetimibe (ZETIA) 10 MG tablet Take 1 tablet (10 mg total) by mouth daily. 90 tablet 3  . hydrALAZINE (APRESOLINE) 25 MG tablet Take 1 tablet (25 mg total) by mouth 2 (two) times daily. 60 tablet 6  . metoprolol succinate (TOPROL-XL) 50 MG 24 hr tablet TAKE 1/2 TABLET BY MOUTH EVERY MORNING AND TAKE 1 TABLET BY MOUTH EVERY EVENING 135 tablet 3  . metoprolol tartrate (LOPRESSOR) 25 MG tablet Take 25 mg by mouth as needed.     . Multiple Vitamin (MULTIVITAMIN PO) Take 1 tablet by mouth daily.      . Probiotic Product (ALIGN PO) Take 1 tablet by mouth daily.    . ranitidine (ZANTAC) 300 MG tablet Take 1 tablet (300 mg total) by mouth every morning. 90 tablet 3   No current facility-administered medications for this visit.   Facility-Administered Medications Ordered in Other Visits  Medication Dose Route  Frequency Provider Last Rate Last Dose  . aminophylline injection 75 mg  75 mg Intravenous Once Peter M Martinique, MD      . regadenoson Carlton Adam) injection SOLN 0.4 mg  0.4 mg Intravenous Once Peter M Martinique, MD      . technetium sestamibi generic (CARDIOLITE) injection 10 milli Curie  10 milli Curie Intravenous Once PRN Peter M Martinique, MD      . technetium sestamibi generic (CARDIOLITE) injection 30.2 milli Curie  30.2 milli Curie Intravenous Once PRN Peter M Martinique, MD        Allergies  Allergen Reactions  . Acetaminophen-Codeine   . Amlodipine Besylate     REACTION: swelling  . Diltiazem Hcl     REACTION: rash  . Hydrocodone-Acetaminophen     nausea  . Irbesartan-Hydrochlorothiazide     REACTION: Dizziness  . Lisinopril     cough  . Omeprazole Nausea Only and Other (See Comments)    Dizziness, joint pain, weakness in legs, cramps in legs, stomach burned  . Propoxyphene N-Acetaminophen     Headache, and blotchy face  . Valsartan     REACTION: blurred vision  . Warfarin Sodium     REACTION: body aches and bleeding  . Verapamil Nausea Only, Palpitations and Other (See Comments)    Headaches     History   Social History  . Marital Status: Widowed    Spouse Name: N/A  . Number of Children: 4  . Years of Education:  N/A   Occupational History  . retired    Social History Main Topics  . Smoking status: Never Smoker   . Smokeless tobacco: Never Used  . Alcohol Use: No  . Drug Use: No  . Sexual Activity: Not Currently   Other Topics Concern  . Not on file   Social History Narrative     Review of Systems: General: negative for chills, fever, night sweats or weight changes.  Cardiovascular: negative for chest pain, dyspnea on exertion, edema, orthopnea, palpitations, paroxysmal nocturnal dyspnea or shortness of breath Dermatological: negative for rash Respiratory: negative for cough or wheezing Urologic: negative for hematuria Abdominal: negative for nausea,  vomiting, diarrhea, bright red blood per rectum, melena, or hematemesis Neurologic: negative for visual changes, syncope, or dizziness All other systems reviewed and are otherwise negative except as noted above.    Blood pressure 118/60, pulse 76, height 5\' 6"  (1.676 m), weight 198 lb 8 oz (90.039 kg).  General appearance: alert and no distress Neck: no adenopathy, no carotid bruit, no JVD, supple, symmetrical, trachea midline and thyroid not enlarged, symmetric, no tenderness/mass/nodules Lungs: clear to auscultation bilaterally Heart: regular rate and rhythm, S1, S2 normal, no murmur, click, rub or gallop Extremities: 1+ right pedal pulse absent left pedal pulse  EKG not performed today  ASSESSMENT AND PLAN:   Claudication Miss Buntin is an 81 year old female referred to me by Dr. Claiborne Billings for peripheral vascular evaluation because of lifestyle limiting left lower extremity claudication. She had lower extremity R till Doppler studies performed 02/09/15 revealing a right ABI 0.634 appears to be SFA disease. Based on this, we decided to proceed with peripheral angiography potential and prevention. She did have a Myoview stress test performed by Dr. Claiborne Billings because of shortness of breath and fatigue. This showed mild antero-septal and inferior ischemia. Based on this, I will arrange for her to undergo outpatient diagnostic coronary arteriography by Dr. Claiborne Billings to further define her coronary anatomy and risk stratify her. After that we will then proceed with evaluation of her peripheral vascular disease.      Lorretta Harp MD FACP,FACC,FAHA, [facility] 03/09/2015 2:42 PM

## 2015-03-13 NOTE — Discharge Instructions (Signed)
Radial Site Care °Refer to this sheet in the next few weeks. These instructions provide you with information on caring for yourself after your procedure. Your caregiver may also give you more specific instructions. Your treatment has been planned according to current medical practices, but problems sometimes occur. Call your caregiver if you have any problems or questions after your procedure. °HOME CARE INSTRUCTIONS °· You may shower the day after the procedure. Remove the bandage (dressing) and gently wash the site with plain soap and water. Gently pat the site dry. °· Do not apply powder or lotion to the site. °· Do not submerge the affected site in water for 3 to 5 days. °· Inspect the site at least twice daily. °· Do not flex or bend the affected arm for 24 hours. °· No lifting over 5 pounds (2.3 kg) for 5 days after your procedure. °· Do not drive home if you are discharged the same day of the procedure. Have someone else drive you. °· You may drive 24 hours after the procedure unless otherwise instructed by your caregiver. °· Do not operate machinery or power tools for 24 hours. °· A responsible adult should be with you for the first 24 hours after you arrive home. °What to expect: °· Any bruising will usually fade within 1 to 2 weeks. °· Blood that collects in the tissue (hematoma) may be painful to the touch. It should usually decrease in size and tenderness within 1 to 2 weeks. °SEEK IMMEDIATE MEDICAL CARE IF: °· You have unusual pain at the radial site. °· You have redness, warmth, swelling, or pain at the radial site. °· You have drainage (other than a small amount of blood on the dressing). °· You have chills. °· You have a fever or persistent symptoms for more than 72 hours. °· You have a fever and your symptoms suddenly get worse. °· Your arm becomes pale, cool, tingly, or numb. °· You have heavy bleeding from the site. Hold pressure on the site. °Document Released: 09/20/2010 Document Revised:  11/10/2011 Document Reviewed: 09/20/2010 °ExitCare® Patient Information ©2015 [employer]. This information is not intended to replace advice given to you by your health care provider. Make sure you discuss any questions you have with your health care provider. ° °

## 2015-03-14 ENCOUNTER — Encounter (HOSPITAL_COMMUNITY): Payer: Self-pay | Admitting: Cardiovascular Disease

## 2015-03-14 MED FILL — Lidocaine HCl Local Preservative Free (PF) Inj 1%: INTRAMUSCULAR | Qty: 30 | Status: AC

## 2015-03-14 MED FILL — Heparin Sodium (Porcine) 2 Unit/ML in Sodium Chloride 0.9%: INTRAMUSCULAR | Qty: 1000 | Status: AC

## 2015-03-14 MED FILL — Nitroglycerin IV Soln 100 MCG/ML in D5W: INTRA_ARTERIAL | Qty: 10 | Status: AC

## 2015-03-15 ENCOUNTER — Encounter: Payer: Self-pay | Admitting: *Deleted

## 2015-03-29 ENCOUNTER — Telehealth: Payer: Self-pay | Admitting: Cardiovascular Disease

## 2015-03-29 NOTE — Telephone Encounter (Signed)
Left message for Nicki Reaper that patient had cancelled her procedure for Monday 04/02/15.  Ask that he call [phone] to confirm.

## 2015-04-02 ENCOUNTER — Encounter (HOSPITAL_COMMUNITY): Admission: RE | Payer: Self-pay | Source: Ambulatory Visit

## 2015-04-02 ENCOUNTER — Ambulatory Visit (HOSPITAL_COMMUNITY): Admission: RE | Admit: 2015-04-02 | Payer: Medicare Other | Source: Ambulatory Visit | Admitting: Cardiovascular Disease

## 2015-04-02 DIAGNOSIS — I480 Paroxysmal atrial fibrillation: Secondary | ICD-10-CM | POA: Insufficient documentation

## 2015-04-02 HISTORY — DX: Paroxysmal atrial fibrillation: I48.0

## 2015-04-02 SURGERY — LOWER EXTREMITY ANGIOGRAPHY
Anesthesia: LOCAL

## 2015-04-17 ENCOUNTER — Encounter (HOSPITAL_COMMUNITY): Payer: Self-pay | Admitting: *Deleted

## 2015-04-17 ENCOUNTER — Emergency Department (HOSPITAL_COMMUNITY): Payer: Medicare Other

## 2015-04-17 ENCOUNTER — Inpatient Hospital Stay (HOSPITAL_COMMUNITY)
Admission: EM | Admit: 2015-04-17 | Discharge: 2015-04-22 | DRG: 310 | Disposition: A | Discharge status: Home / Self Care | Payer: Medicare Other | Attending: Cardiology | Admitting: Cardiology

## 2015-04-17 ENCOUNTER — Telehealth: Payer: Self-pay | Admitting: Cardiovascular Disease

## 2015-04-17 DIAGNOSIS — K449 Diaphragmatic hernia without obstruction or gangrene: Secondary | ICD-10-CM | POA: Diagnosis present

## 2015-04-17 DIAGNOSIS — I739 Peripheral vascular disease, unspecified: Secondary | ICD-10-CM | POA: Diagnosis not present

## 2015-04-17 DIAGNOSIS — Z7982 Long term (current) use of aspirin: Secondary | ICD-10-CM | POA: Diagnosis not present

## 2015-04-17 DIAGNOSIS — K219 Gastro-esophageal reflux disease without esophagitis: Secondary | ICD-10-CM | POA: Diagnosis present

## 2015-04-17 DIAGNOSIS — I4891 Unspecified atrial fibrillation: Secondary | ICD-10-CM | POA: Diagnosis not present

## 2015-04-17 DIAGNOSIS — Z5181 Encounter for therapeutic drug level monitoring: Secondary | ICD-10-CM | POA: Diagnosis not present

## 2015-04-17 DIAGNOSIS — E785 Hyperlipidemia, unspecified: Secondary | ICD-10-CM | POA: Diagnosis present

## 2015-04-17 DIAGNOSIS — Z85038 Personal history of other malignant neoplasm of large intestine: Secondary | ICD-10-CM | POA: Diagnosis not present

## 2015-04-17 DIAGNOSIS — Z7901 Long term (current) use of anticoagulants: Secondary | ICD-10-CM | POA: Diagnosis not present

## 2015-04-17 DIAGNOSIS — R069 Unspecified abnormalities of breathing: Secondary | ICD-10-CM | POA: Diagnosis not present

## 2015-04-17 DIAGNOSIS — I10 Essential (primary) hypertension: Secondary | ICD-10-CM | POA: Diagnosis not present

## 2015-04-17 DIAGNOSIS — I2510 Atherosclerotic heart disease of native coronary artery without angina pectoris: Secondary | ICD-10-CM | POA: Diagnosis present

## 2015-04-17 DIAGNOSIS — Z79899 Other long term (current) drug therapy: Secondary | ICD-10-CM | POA: Diagnosis not present

## 2015-04-17 DIAGNOSIS — I480 Paroxysmal atrial fibrillation: Principal | ICD-10-CM | POA: Diagnosis present

## 2015-04-17 DIAGNOSIS — Z6830 Body mass index (BMI) 30.0-30.9, adult: Secondary | ICD-10-CM | POA: Diagnosis not present

## 2015-04-17 DIAGNOSIS — I70219 Atherosclerosis of native arteries of extremities with intermittent claudication, unspecified extremity: Secondary | ICD-10-CM

## 2015-04-17 DIAGNOSIS — R0602 Shortness of breath: Secondary | ICD-10-CM | POA: Diagnosis not present

## 2015-04-17 DIAGNOSIS — I5031 Acute diastolic (congestive) heart failure: Secondary | ICD-10-CM | POA: Diagnosis not present

## 2015-04-17 HISTORY — DX: Atherosclerosis of native arteries of extremities with intermittent claudication, unspecified extremity: I70.219

## 2015-04-17 HISTORY — DX: Paroxysmal atrial fibrillation: I48.0

## 2015-04-17 LAB — COMPREHENSIVE METABOLIC PANEL
ALBUMIN: 3.7 g/dL (ref 3.5–5.0)
ALT: 22 U/L (ref 14–54)
ANION GAP: 13 (ref 5–15)
AST: 31 U/L (ref 15–41)
Alkaline Phosphatase: 47 U/L (ref 38–126)
BUN: 22 mg/dL — ABNORMAL HIGH (ref 6–20)
CO2: 25 mmol/L (ref 22–32)
Calcium: 9.6 mg/dL (ref 8.9–10.3)
Chloride: 104 mmol/L (ref 101–111)
Creatinine, Ser: 1.24 mg/dL — ABNORMAL HIGH (ref 0.44–1.00)
GFR calc Af Amer: 46 mL/min — ABNORMAL LOW (ref >60)
GFR calc non Af Amer: 40 mL/min — ABNORMAL LOW (ref >60)
GLUCOSE: 105 mg/dL — AB (ref 65–99)
POTASSIUM: 3.8 mmol/L (ref 3.5–5.1)
SODIUM: 142 mmol/L (ref 135–145)
Total Bilirubin: 1 mg/dL (ref 0.3–1.2)
Total Protein: 6.4 g/dL — ABNORMAL LOW (ref 6.5–8.1)

## 2015-04-17 LAB — CBC
HCT: 48 % — ABNORMAL HIGH (ref 36.0–46.0)
HEMOGLOBIN: 16.3 g/dL — AB (ref 12.0–15.0)
MCH: 31.1 pg (ref 26.0–34.0)
MCHC: 34 g/dL (ref 30.0–36.0)
MCV: 91.6 fL (ref 78.0–100.0)
Platelets: 205 10*3/uL (ref 150–400)
RBC: 5.24 MIL/uL — ABNORMAL HIGH (ref 3.87–5.11)
RDW: 13.3 % (ref 11.5–15.5)
WBC: 7.3 10*3/uL (ref 4.0–10.5)

## 2015-04-17 LAB — I-STAT TROPONIN, ED: TROPONIN I, POC: 0.01 ng/mL (ref 0.00–0.08)

## 2015-04-17 LAB — PROTIME-INR
INR: 1.08 (ref 0.00–1.49)
Prothrombin Time: 14.2 seconds (ref 11.6–15.2)

## 2015-04-17 LAB — APTT: APTT: 27 s (ref 24–37)

## 2015-04-17 MED ORDER — DIGOXIN 125 MCG PO TABS
0.1250 mg | ORAL_TABLET | Freq: Every day | ORAL | Status: DC
  Administered 2015-04-18 – 2015-04-19 (×2): 0.125 mg via ORAL
  Filled 2015-04-17 (×2): qty 1

## 2015-04-17 MED ORDER — SODIUM CHLORIDE 0.9 % IV SOLN
1000.0000 mL | INTRAVENOUS | Status: DC
  Administered 2015-04-17: 1000 mL via INTRAVENOUS

## 2015-04-17 MED ORDER — METOPROLOL TARTRATE 1 MG/ML IV SOLN
5.0000 mg | Freq: Once | INTRAVENOUS | Status: AC
  Administered 2015-04-17: 5 mg via INTRAVENOUS
  Filled 2015-04-17: qty 5

## 2015-04-17 MED ORDER — EZETIMIBE 10 MG PO TABS
10.0000 mg | ORAL_TABLET | Freq: Every day | ORAL | Status: DC
  Administered 2015-04-17 – 2015-04-22 (×6): 10 mg via ORAL
  Filled 2015-04-17 (×6): qty 1

## 2015-04-17 MED ORDER — DIGOXIN 250 MCG PO TABS
0.2500 mg | ORAL_TABLET | Freq: Two times a day (BID) | ORAL | Status: AC
  Administered 2015-04-17 (×2): 0.25 mg via ORAL
  Filled 2015-04-17: qty 2
  Filled 2015-04-17: qty 1

## 2015-04-17 MED ORDER — APIXABAN 5 MG PO TABS
5.0000 mg | ORAL_TABLET | Freq: Two times a day (BID) | ORAL | Status: DC
  Administered 2015-04-17 – 2015-04-22 (×11): 5 mg via ORAL
  Filled 2015-04-17 (×12): qty 1

## 2015-04-17 MED ORDER — METOPROLOL TARTRATE 50 MG PO TABS
50.0000 mg | ORAL_TABLET | Freq: Two times a day (BID) | ORAL | Status: DC
  Administered 2015-04-17 – 2015-04-18 (×3): 50 mg via ORAL
  Filled 2015-04-17 (×3): qty 1

## 2015-04-17 MED ORDER — ONDANSETRON HCL 4 MG/2ML IJ SOLN: 4.0000 mg | Freq: Four times a day (QID) | INTRAMUSCULAR | Status: DC | PRN

## 2015-04-17 NOTE — ED Notes (Signed)
X-ray at bedside

## 2015-04-17 NOTE — Telephone Encounter (Signed)
Patient states her heart is out of rhythm and she almost passed out a few minutes ago.

## 2015-04-17 NOTE — Telephone Encounter (Signed)
Spoke to patient  Patient is "panting",sighing,  She reports heart is out of rhythm she states she almost passed out.she feels weak and dizzy She states she can not tell how fast heart rate is going - does not read on blood pressure cuff. RN informed patient to hang up and call 911 and go to [place]. PATIENT VERBALIZED UNDERSTANDING. RN NOTIFIED [place] card master- Wannetta Sender) of patient's arrival

## 2015-04-17 NOTE — H&P (Signed)
Admit date: 04/17/2015 Primary Physician  [name], MD Primary Cardiologist  Dr. Claiborne Billings  CC: atrial fibrillation with RVR  HPI: [name] is a 81 yo female with PMHx of CAD, PAF, PVD, HTN, and HLD who presents with complaint of palpitations, shortness of breath, lightheadedness, weakness and pre-syncope. Patient states she has a history of paroxysmal atrial fibrillation for which she has never had to be cardioverted for. She typically develops atrial fibrillation once every 1-2 years that she notices. She has had to be hospitalized once for atrial fibrillation with RVR but converted to sinus rhythm prior to cardioversion. Patient felt heart palpitations start 2 days ago. She tried to take extra metoprolol to help her symptoms; however her palpitations persisted. This morning, patient became symptomatic with associated pre-syncope, lightheadedness, shortness of breath and weakness. She noticed a slight chest tightness in the center of her chest without radiation that she attributed to shortness of breath.  On admission, patient was normotensive, tachycardic at 142, satting well on room air. EKG revealed atrial fibrillation with RVR, rate 146. Patient was given labetalol 20 mg once with EMS and metoprolol 5 mg IV once in the ED.   LHC 03/13/15: Mid Cx lesion, 10% stenosed. The left ventricular systolic function is normal, estimated ejection fraction of 55-60%. No significant coronary obstructive disease with the midportion of the mid LAD dipping intramyocardially without evidence for systolic bridging, smooth 10% narrowing in the AV groove circumflex coronary artery, and normal dominant RCA. Myoview 03/09/15: Low risk stress nuclear study with a very small area of mild apicoseptal ischemia and normal left ventricular regional and global systolic function. Echo 2012: EF 55-60%, mild LVH, mild AR and MR.   PMH:   Past Medical History  Diagnosis Date  . Arthritis   . Hypertension   . Iron  deficiency anemia, unspecified   . Osteoporosis   . Colon cancer   . Unspecified vascular insufficiency of intestine   . HLD (hyperlipidemia)   . Coronary atherosclerosis of unspecified type of vessel, native or graft   . Atrial fibrillation   . Ischemic colitis   . Fatty liver 11/05/11  . Hiatal hernia   . Esophageal stricture   . Claudication   . Abnormal nuclear stress test     mild anterolateral septal and inferior ischemia    PSH:   Past Surgical History  Procedure Laterality Date  . Tubal ligation    . Appendectomy    . Sigmoid resection for invasive rectal adenocarcinoma  12/2006  . Abdominoperineal resection anastomotic stricture  05/2007  . Dilation and curettage of uterus  1992  . Rotator cuff repair  2006    left  . Mandibular renstruction    . Rt. salpingo oophorectomy and cyst removal  2005  . Wrist surgery  2008    right  . Esophagogastroduodenoscopy N/A 04/27/2014    Procedure: ESOPHAGOGASTRODUODENOSCOPY (EGD);  Surgeon: Lafayette Dragon, MD;  Location: [facility];  Service: Endoscopy;  Laterality: N/A;  . Savory dilation N/A 04/27/2014    Procedure: SAVORY DILATION;  Surgeon: Lafayette Dragon, MD;  Location: [facility];  Service: Endoscopy;  Laterality: N/A;  no xray needed  . Cardiac catheterization N/A 03/13/2015    Procedure: Left Heart Cath and Coronary Angiography;  Surgeon: Troy Sine, MD;  Location: [facility];  Service: Cardiovascular;  Laterality: N/A;   Allergies:  Acetaminophen-codeine; Amlodipine besylate; Diltiazem hcl; Hydrocodone-acetaminophen; Irbesartan-hydrochlorothiazide; Lisinopril; Omeprazole; Propoxyphene n-acetaminophen; Valsartan; Warfarin sodium; and Verapamil Prior  to Admit Meds:   Prior to Admission medications   Medication Sig Start Date End Date Taking? Authorizing Provider  acetaminophen (TYLENOL) 500 MG tablet Take 1,000 mg by mouth every 8 (eight) hours as needed for mild pain or moderate pain.    Historical Provider, MD    Aliskiren-Hydrochlorothiazide (TEKTURNA HCT) 300-25 MG TABS Take 1 tablet by mouth daily. 08/02/14   Troy Sine, MD  aspirin 81 MG tablet Take 1 tablet (81 mg total) by mouth daily. 02/23/15   Troy Sine, MD  Calcium Carbonate-Vitamin D (CALCIUM 600 + D PO) Take 2 tablets by mouth daily.    Historical Provider, MD  ezetimibe (ZETIA) 10 MG tablet Take 1 tablet (10 mg total) by mouth daily. 02/23/15   Troy Sine, MD  hydrALAZINE (APRESOLINE) 25 MG tablet Take 1 tablet (25 mg total) by mouth 2 (two) times daily. Patient not taking: Reported on 03/12/2015 02/23/15   Troy Sine, MD  metoprolol succinate (TOPROL-XL) 50 MG 24 hr tablet TAKE 1/2 TABLET BY MOUTH EVERY MORNING AND TAKE 1 TABLET BY MOUTH EVERY EVENING 07/25/14   Troy Sine, MD  Multiple Vitamin (MULTIVITAMIN PO) Take 1 tablet by mouth daily.      Historical Provider, MD  ranitidine (ZANTAC) 300 MG tablet Take 1 tablet (300 mg total) by mouth every morning. 11/15/14   Lafayette Dragon, MD   Fam HX:    Family History  Problem Relation Age of Onset  . Colon cancer Father 62  . Heart disease Father   . Hypertension Father   . Esophageal cancer Neg Hx   . Rectal cancer Neg Hx   . Stomach cancer Neg Hx   . Alzheimer's disease Mother   . Thyroid disease Mother   . Hypertension Sister   . Thyroid disease Sister   . Mitral valve prolapse Sister   . Hypertension Son   . Diabetes Son   . Hypertension Son   . Diabetes Son    Social HX:    Social History   Social History  . Marital Status: Widowed    Spouse Name: N/A  . Number of Children: 4  . Years of Education: N/A   Occupational History  . retired    Social History Main Topics  . Smoking status: Never Smoker   . Smokeless tobacco: Never Used  . Alcohol Use: No  . Drug Use: No  . Sexual Activity: Not Currently   Other Topics Concern  . Not on file   Social History Narrative     ROS:   General: Denies fever, chills, fatigue, and diaphoresis.   Respiratory: Admits to SOB. Denies cough, DOE, chest tightness, and wheezing.   Cardiovascular: Admits to palpitations. Denies chest pain.  Gastrointestinal: Denies nausea, vomiting, abdominal pain, diarrhea Skin: Denies pallor, rash and wounds.  Neurological: Admits to lightheadedness. Denies numbness, and syncope,   Physical Exam: Filed Vitals:   04/17/15 1115 04/17/15 1121 04/17/15 1130 04/17/15 1145  BP: 103/78  105/79 118/92  Pulse: 97   67  Temp:  97.9 F (36.6 C)    TempSrc:  Oral    Resp: 24  15 16   Height:      Weight:      SpO2: 97%  97% 97%   General: Vital signs reviewed.  Patient is well-developed and well-nourished, in no acute distress and cooperative with exam. Appears younger than stated age  Neck: No JVD, no carotid bruit present.  Cardiovascular: Irregularly irregular, no  murmurs, gallops, or rubs. Pulmonary/Chest: Clear to auscultation bilaterally, no wheezes, rales, or rhonchi. Abdominal: Soft, non-tender, non-distended, BS + Extremities: No lower extremity edema bilaterally Neurological: A&O x3 Skin: Warm, dry and intact. No rashes or erythema. Psychiatric: Normal mood and affect. speech and behavior is normal. Cognition and memory are normal.      Labs:   Lab Results  Component Value Date   WBC 6.1 03/09/2015   HGB 15.2* 03/09/2015   HCT 43.9 03/09/2015   MCV 89.8 03/09/2015   PLT 192 03/09/2015   Lab Results  Component Value Date   CHOL 216* 07/24/2014   HDL 53 07/24/2014   LDLCALC 121* 07/24/2014   TRIG 210* 07/24/2014    Radiology:  Dg Chest Portable 1 View  04/17/2015   CLINICAL DATA:  Atrial fibrillation, shortness of breath, fatigue. Syncope. Symptoms for 1-2 days.  EXAM: PORTABLE CHEST - 1 VIEW  COMPARISON:  03/09/2015  FINDINGS: The heart size and mediastinal contours are within normal limits. Both lungs are clear. The visualized skeletal structures are unremarkable.  IMPRESSION: No active disease.   Electronically Signed   By: Rolm Baptise M.D.   On: 04/17/2015 11:22   Personally viewed.   EKG:  Atrial fibrillation with RVR, HR 146. Personally viewed.  ASSESSMENT/PLAN:   Atrial Fibrillation with RVR: Patient has a history of PAF, but states she has never had to be cardioverted before. She is on ASA 81 mg daily and metoprolol 25 mg QAM and 50 mg QPM. CHADSVASC score 5. Patient was given metoprolol 5 mg IV without improvement in HR. Patient is allergic to Cardizem.   HTN: Low normal on presentation. Patient is on aliskiren-HCTZ 300-25 mg daily, hydralazine 25 mg BID, metoprolol 25 mg QAM and 50 mg QPM at home.   HLD: On Zetia 10 mg daily at home.   Osa Craver, DO PGY-2 Internal Medicine Resident Pager # [phone] 04/17/2015 11:48 AM As above, patient seen and examined. Briefly she is an 81 year old female with a past medical history of paroxysmal atrial fibrillation, peripheral vascular disease, hypertension and hyperlipidemia with recurrent atrial fibrillation. Patient developed chest tightness Sunday morning. This was also associated with dyspnea. No palpitations. She checked her pulse and she was tachycardic and irregular. This is similar to her previous atrial fibrillation events. She took extra metoprolol but the symptoms have persisted. Today she noticed increasing dyspnea and dizziness. She has not had syncope. She presented to the emergency room and was noted to be in atrial fibrillation with rapid ventricular response. Note cardiac catheterization July 2016 showed normal LV function and no obstructive coronary disease. She is scheduled to have peripheral arteriogram for left lower extremity claudication.  1 paroxysmal atrial fibrillation-CHADSvasc 5; plan to add apixaban 5 mg po BID (pt hesitant but willing to take). Check TSH and echocardiogram. She has had multiple drug allergies previously. She had a rash to Cardizem. She could not take verapamil because of tingling in her extremities and blurred vision. She  states that higher doses of metoprolol have caused extreme fatigue previously. We will increase metoprolol to 50 mg twice a day to see if she tolerates. Add low-dose digoxin to help with rate control. We will avoid amiodarone at this point as she has had multiple side effects to medications previously. However this could be considered in the future. If she does not convert on her own plan transesophageal echocardiogram guided cardioversion on Friday after 5 doses of apixaban. I have explained she will need to continue  anticoagulation for at least 4 weeks following cardioversion and I have recommended indefinitely given embolic risk factors. Her blood pressure is borderline. I will hold hydralazine and Tekturna for now. Discontinue aspirin given addition of anticoagulation. Her peripheral arteriogram will need to be delayed as her anticoagulation cannot be interrupted for 4 weeks following cardioversion. Kirk Ruths

## 2015-04-17 NOTE — ED Notes (Addendum)
Pt presents via [facility] for uncontrolled Afib x 2 days, became symptomatic today, SOB, palpitations, pale.  Pt reports usually waiting it out and it resolves.  On EMS arrival HR 150s, BP-138/90, allergic to cardizem and coumadin.  Usually on metoprolol and hydralazine.  EMS gave 20mg  labetolol, HR 120s, BP-96/66, asymptomatic on arrival.  Pt a x 4, NAD, denies CP/N/V.  MD at bedside. Pt reports taking 324 ASA this AM, no other morning medications taken.

## 2015-04-17 NOTE — ED Provider Notes (Signed)
CSN: [id number]     Arrival date & time 04/17/15  1030 History   First MD Initiated Contact with Patient 04/17/15 1031     Chief Complaint  Patient presents with  . Atrial Fibrillation   HPI Patient presents to the emergency room with complaints of palpitations and atrial fibrillation. Patient has a history of nonobstructive coronary artery disease. She had a cardiac catheterization this year that showed possibly 10% obstruction with plans for continued medical management. Size history of atrial fibrillation. Patient states her atrial fibrillation is paroxysmal. She has had episodes in the past but has never required cardioversion. She does take an aspirin daily but no other anticoagulate medications. She started having palpitations about 2 days ago. Patient took an extra of her metoprolol and was waiting to see if the symptoms would resolve again. today however she started become short of breath and felt weak. She called her cardiologist and was told to call 911. EMS gave the patient 20 mg of labetalol after noting her heart rate in the 150s with a blood pressure 138/90. He shouldn't had improvement of her symptoms. Heart rate decreased into the 120s. Blood pressure remained 96/66. Patient continues to feel better. She denies any chest pain. She is not currently feeling short of breath. Past Medical History  Diagnosis Date  . Arthritis   . Hypertension   . Iron deficiency anemia, unspecified   . Osteoporosis   . Colon cancer   . Unspecified vascular insufficiency of intestine   . HLD (hyperlipidemia)   . Coronary atherosclerosis of unspecified type of vessel, native or graft   . Atrial fibrillation   . Ischemic colitis   . Fatty liver 11/05/11  . Hiatal hernia   . Esophageal stricture   . Claudication   . Abnormal nuclear stress test     mild anterolateral septal and inferior ischemia   Past Surgical History  Procedure Laterality Date  . Tubal ligation    . Appendectomy    . Sigmoid  resection for invasive rectal adenocarcinoma  12/2006  . Abdominoperineal resection anastomotic stricture  05/2007  . Dilation and curettage of uterus  1992  . Rotator cuff repair  2006    left  . Mandibular renstruction    . Rt. salpingo oophorectomy and cyst removal  2005  . Wrist surgery  2008    right  . Esophagogastroduodenoscopy N/A 04/27/2014    Procedure: ESOPHAGOGASTRODUODENOSCOPY (EGD);  Surgeon: Lafayette Dragon, MD;  Location: [facility];  Service: Endoscopy;  Laterality: N/A;  . Savory dilation N/A 04/27/2014    Procedure: SAVORY DILATION;  Surgeon: Lafayette Dragon, MD;  Location: [facility];  Service: Endoscopy;  Laterality: N/A;  no xray needed  . Cardiac catheterization N/A 03/13/2015    Procedure: Left Heart Cath and Coronary Angiography;  Surgeon: Troy Sine, MD;  Location: [facility];  Service: Cardiovascular;  Laterality: N/A;   Family History  Problem Relation Age of Onset  . Colon cancer Father 83  . Heart disease Father   . Hypertension Father   . Esophageal cancer Neg Hx   . Rectal cancer Neg Hx   . Stomach cancer Neg Hx   . Alzheimer's disease Mother   . Thyroid disease Mother   . Hypertension Sister   . Thyroid disease Sister   . Mitral valve prolapse Sister   . Hypertension Son   . Diabetes Son   . Hypertension Son   . Diabetes Son    Social History  Substance Use Topics  . Smoking status: Never Smoker   . Smokeless tobacco: Never Used  . Alcohol Use: No   OB History    No data available     Review of Systems  All other systems reviewed and are negative.     Allergies  Acetaminophen-codeine; Amlodipine besylate; Diltiazem hcl; Hydrocodone-acetaminophen; Irbesartan-hydrochlorothiazide; Lisinopril; Omeprazole; Propoxyphene n-acetaminophen; Valsartan; Warfarin sodium; and Verapamil  Home Medications   Prior to Admission medications   Medication Sig Start Date End Date Taking? Authorizing Provider  Aliskiren-Hydrochlorothiazide  (TEKTURNA HCT) 300-25 MG TABS Take 1 tablet by mouth daily. 08/02/14  Yes Troy Sine, MD  aspirin 81 MG tablet Take 1 tablet (81 mg total) by mouth daily. 02/23/15  Yes Troy Sine, MD  Calcium Carbonate-Vitamin D (CALCIUM 600 + D PO) Take 2 tablets by mouth daily.   Yes Historical Provider, MD  ezetimibe (ZETIA) 10 MG tablet Take 1 tablet (10 mg total) by mouth daily. 02/23/15  Yes Troy Sine, MD  metoprolol succinate (TOPROL-XL) 50 MG 24 hr tablet TAKE 1/2 TABLET BY MOUTH EVERY MORNING AND TAKE 1 TABLET BY MOUTH EVERY EVENING 07/25/14  Yes Troy Sine, MD  Multiple Vitamin (MULTIVITAMIN PO) Take 1 tablet by mouth daily.     Yes Historical Provider, MD  ranitidine (ZANTAC) 300 MG tablet Take 1 tablet (300 mg total) by mouth every morning. 11/15/14  Yes Lafayette Dragon, MD  hydrALAZINE (APRESOLINE) 25 MG tablet Take 1 tablet (25 mg total) by mouth 2 (two) times daily. Patient not taking: Reported on 03/12/2015 02/23/15   Troy Sine, MD   BP 101/70 mmHg  Pulse 138  Temp(Src) 97.9 F (36.6 C) (Oral)  Resp 16  Ht 5\' 6"  (1.676 m)  Wt 190 lb (86.183 kg)  BMI 30.68 kg/m2  SpO2 97% Physical Exam  Constitutional: She appears well-developed and well-nourished. No distress.  HENT:  Head: Normocephalic and atraumatic.  Right Ear: External ear normal.  Left Ear: External ear normal.  Eyes: Conjunctivae are normal. Right eye exhibits no discharge. Left eye exhibits no discharge. No scleral icterus.  Neck: Neck supple. No tracheal deviation present.  Cardiovascular: Intact distal pulses.  An irregularly irregular rhythm present. Tachycardia present.   Pulmonary/Chest: Effort normal and breath sounds normal. No stridor. No respiratory distress. She has no wheezes. She has no rales.  Abdominal: Soft. Bowel sounds are normal. She exhibits no distension. There is no tenderness. There is no rebound and no guarding.  Musculoskeletal: She exhibits no edema or tenderness.  Neurological: She is  alert. She has normal strength. No cranial nerve deficit (no facial droop, extraocular movements intact, no slurred speech) or sensory deficit. She exhibits normal muscle tone. She displays no seizure activity. Coordination normal.  Skin: Skin is warm and dry. No rash noted.  Psychiatric: She has a normal mood and affect.  Nursing note and vitals reviewed.   ED Course  Procedures (including critical care time) Labs Review Labs Reviewed  APTT  CBC  COMPREHENSIVE METABOLIC PANEL  PROTIME-INR  Randolm Idol, ED    Imaging Review Dg Chest Portable 1 View  04/17/2015   CLINICAL DATA:  Atrial fibrillation, shortness of breath, fatigue. Syncope. Symptoms for 1-2 days.  EXAM: PORTABLE CHEST - 1 VIEW  COMPARISON:  03/09/2015  FINDINGS: The heart size and mediastinal contours are within normal limits. Both lungs are clear. The visualized skeletal structures are unremarkable.  IMPRESSION: No active disease.   Electronically Signed   By: Lennette Bihari  Dover M.D.   On: 04/17/2015 11:22   I  personally reviewed and evaluated these images and lab results as part of my medical decision-making.   EKG Interpretation   Date/Time:  Tuesday April 17 2015 10:36:40 EDT Ventricular Rate:  146 PR Interval:    QRS Duration: 95 QT Interval:  325 QTC Calculation: 506 R Axis:   6 Text Interpretation:  Atrial fibrillation with rapid V-rate , new since  last tracing Ventricular premature complex Low voltage, extremity and  precordial leads Abnormal R-wave progression, early transition ST  depression, probably rate related Confirmed by Edge Mauger  MD-J, Yanelie Abraha ([id number]) on  04/17/2015 10:51:06 AM     Medications  0.9 %  sodium chloride infusion (1,000 mLs Intravenous New Bag/Given 04/17/15 1049)  apixaban (ELIQUIS) tablet 5 mg (not administered)  metoprolol (LOPRESSOR) injection 5 mg (5 mg Intravenous Given 04/17/15 1049)    MDM   Final diagnoses:  Atrial fibrillation with rapid ventricular response    Patient  is having symptomatic recurrent atrial fibrillation with rapid ventricular rate. Upon arrival, her blood pressure is 113/74 but she is still tachycardic with heart rate of 130s to 140s on the monitor.  I will order a dose of metoprolol IV. I have ordered laboratory tests and x-rays. I will consult with the cardiology service, they expecting her here in the emergency room.  Patient is currently not on anticoagulation other than aspirin.  She has paroxysmal atrial fibrillation but has never required cardioversion.  Pt appears stable at this time.  Will monitor closely.    Dorie Rank, MD 04/17/15 1225

## 2015-04-18 ENCOUNTER — Inpatient Hospital Stay (HOSPITAL_COMMUNITY): Payer: Medicare Other

## 2015-04-18 DIAGNOSIS — I4891 Unspecified atrial fibrillation: Secondary | ICD-10-CM

## 2015-04-18 LAB — BASIC METABOLIC PANEL
Anion gap: 9 (ref 5–15)
BUN: 21 mg/dL — AB (ref 6–20)
CO2: 25 mmol/L (ref 22–32)
CREATININE: 1.07 mg/dL — AB (ref 0.44–1.00)
Calcium: 9.3 mg/dL (ref 8.9–10.3)
Chloride: 107 mmol/L (ref 101–111)
GFR calc Af Amer: 55 mL/min — ABNORMAL LOW (ref >60)
GFR, EST NON AFRICAN AMERICAN: 48 mL/min — AB (ref >60)
GLUCOSE: 103 mg/dL — AB (ref 65–99)
Potassium: 3.7 mmol/L (ref 3.5–5.1)
SODIUM: 141 mmol/L (ref 135–145)

## 2015-04-18 LAB — PROTIME-INR
INR: 1.32 (ref 0.00–1.49)
Prothrombin Time: 16.5 seconds — ABNORMAL HIGH (ref 11.6–15.2)

## 2015-04-18 LAB — CBC
HCT: 46.3 % — ABNORMAL HIGH (ref 36.0–46.0)
Hemoglobin: 15.7 g/dL — ABNORMAL HIGH (ref 12.0–15.0)
MCH: 30.9 pg (ref 26.0–34.0)
MCHC: 33.9 g/dL (ref 30.0–36.0)
MCV: 91.1 fL (ref 78.0–100.0)
PLATELETS: 179 10*3/uL (ref 150–400)
RBC: 5.08 MIL/uL (ref 3.87–5.11)
RDW: 13.2 % (ref 11.5–15.5)
WBC: 6.4 10*3/uL (ref 4.0–10.5)

## 2015-04-18 LAB — TSH: TSH: 2.909 u[IU]/mL (ref 0.350–4.500)

## 2015-04-18 MED ORDER — METOPROLOL TARTRATE 50 MG PO TABS
50.0000 mg | ORAL_TABLET | Freq: Three times a day (TID) | ORAL | Status: DC
  Administered 2015-04-18 – 2015-04-20 (×6): 50 mg via ORAL
  Filled 2015-04-18 (×6): qty 1

## 2015-04-18 MED ORDER — DOCUSATE SODIUM 100 MG PO CAPS
100.0000 mg | ORAL_CAPSULE | Freq: Every day | ORAL | Status: DC | PRN
Start: 2015-04-18 — End: 2015-04-22
  Administered 2015-04-18: 100 mg via ORAL
  Filled 2015-04-18: qty 1

## 2015-04-18 MED ORDER — ZOLPIDEM TARTRATE 5 MG PO TABS: 5.0000 mg | ORAL_TABLET | Freq: Every evening | ORAL | Status: DC | PRN

## 2015-04-18 MED ORDER — ZOLPIDEM TARTRATE 5 MG PO TABS
5.0000 mg | ORAL_TABLET | Freq: Once | ORAL | Status: AC
  Administered 2015-04-18: 5 mg via ORAL
  Filled 2015-04-18: qty 1

## 2015-04-18 NOTE — Progress Notes (Signed)
Patient Name: [name] Date of Encounter: 04/18/2015  Primary Cardiologist: Dr. Gwenlyn Found   Active Problems:   PAF (paroxysmal atrial fibrillation)   Atrial fibrillation with rapid ventricular response    SUBJECTIVE  Denies any CP or SOB.   CURRENT MEDS . apixaban  5 mg Oral BID  . digoxin  0.125 mg Oral Daily  . ezetimibe  10 mg Oral Daily  . metoprolol tartrate  50 mg Oral BID    OBJECTIVE  Filed Vitals:   04/17/15 1346 04/17/15 1421 04/17/15 2129 04/18/15 0456  BP: 102/79 131/70 123/77 93/77  Pulse: 100 120 108 104  Temp:  97.7 F (36.5 C) 98 F (36.7 C) 97.9 F (36.6 C)  TempSrc:  Oral Oral Oral  Resp: 12 20 18 16   Height:  5\' 6"  (1.676 m)    Weight:  192 lb (87.091 kg)  190 lb 6.4 oz (86.365 kg)  SpO2: 98% 100% 97% 97%    Intake/Output Summary (Last 24 hours) at 04/18/15 1025 Last data filed at 04/17/15 2200  Gross per 24 hour  Intake    240 ml  Output      0 ml  Net    240 ml   Filed Weights   04/17/15 1038 04/17/15 1421 04/18/15 0456  Weight: 190 lb (86.183 kg) 192 lb (87.091 kg) 190 lb 6.4 oz (86.365 kg)    PHYSICAL EXAM  General: Pleasant, NAD. Neuro: Alert and oriented X 3. Moves all extremities spontaneously. Psych: Normal affect. HEENT:  Normal  Neck: Supple without bruits or JVD. Lungs:  Resp regular and unlabored, CTA. Heart: irregular. no s3, s4, or murmurs. Abdomen: Soft, non-tender, non-distended, BS + x 4.  Extremities: No clubbing, cyanosis or edema. DP/PT/Radials 2+ and equal bilaterally.  Accessory Clinical Findings  CBC  Recent Labs  04/17/15 1136 04/18/15 0328  WBC 7.3 6.4  HGB 16.3* 15.7*  HCT 48.0* 46.3*  MCV 91.6 91.1  PLT 205 683   Basic Metabolic Panel  Recent Labs  04/17/15 1136 04/18/15 0328  NA 142 141  K 3.8 3.7  CL 104 107  CO2 25 25  GLUCOSE 105* 103*  BUN 22* 21*  CREATININE 1.24* 1.07*  CALCIUM 9.6 9.3   Liver Function Tests  Recent Labs  04/17/15 1136  AST 31  ALT 22  ALKPHOS  47  BILITOT 1.0  PROT 6.4*  ALBUMIN 3.7   Thyroid Function Tests  Recent Labs  04/18/15 0328  TSH 2.909    TELE A-fib with HR 100-120s    ECG  No new EKG  Echocardiogram  pending    Radiology/Studies  Dg Chest Portable 1 View  04/17/2015   CLINICAL DATA:  Atrial fibrillation, shortness of breath, fatigue. Syncope. Symptoms for 1-2 days.  EXAM: PORTABLE CHEST - 1 VIEW  COMPARISON:  03/09/2015  FINDINGS: The heart size and mediastinal contours are within normal limits. Both lungs are clear. The visualized skeletal structures are unremarkable.  IMPRESSION: No active disease.   Electronically Signed   By: Rolm Baptise M.D.   On: 04/17/2015 11:22    ASSESSMENT AND PLAN  81 yo with PMH of CAD, PVD, h/o PAF, HTN, HLD who presented on 04/17/2015 with palpitation, SOB, lightheadedness, weakness and presyncope. Noted to be in afib with RVR with HR 140s on arrival  1. PAF with RVR  - CHADSvasc 5, eliquis started on arrival  - could not tolerate diltiazem due to rash, verapmil cause tingling in extremities and blurred vision. Could  not tolerate high dose due to extreme fatigue previously  - low dose digoxin added to help with rate control.   - HR continued to be uncontrolled, amiodarone was considered but not started given her h/o multiple drug allergies  - discussed with patient different options, will increase metoprolol to 50mg  TID for now. If she does not convert by tomorrow, will put on schedule for TEE DCCV on Friday after 5 doses of eliquis.  - pending echo. TSH normal  2. CAD  - myoview 03/09/2015 low risk study with very small area of mild apicoseptal ischemia, cath 03/13/2015 minimal CAD, EF 55-60%  3. PVD 4. HTN 5. HLD  Signed, Woodward Ku Pager: [phone]

## 2015-04-18 NOTE — Progress Notes (Signed)
UR Completed Terrian Ridlon Graves-Bigelow, RN,BSN [phone]  

## 2015-04-18 NOTE — Progress Notes (Signed)
Echocardiogram 2D Echocardiogram has been performed.  Tresa Res 04/18/2015, 2:15 PM

## 2015-04-18 NOTE — Discharge Instructions (Signed)
Atrial Fibrillation °Atrial fibrillation is a type of irregular heart rhythm (arrhythmia). During atrial fibrillation, the upper chambers of the heart (atria) quiver continuously in a chaotic pattern. This causes an irregular and often rapid heart rate.  °Atrial fibrillation is the result of the heart becoming overloaded with disorganized signals that tell it to beat. These signals are normally released one at a time by a part of the right atrium called the sinoatrial node. They then travel from the atria to the lower chambers of the heart (ventricles), causing the atria and ventricles to contract and pump blood as they pass. In atrial fibrillation, parts of the atria outside of the sinoatrial node also release these signals. This results in two problems. First, the atria receive so many signals that they do not have time to fully contract. Second, the ventricles, which can only receive one signal at a time, beat irregularly and out of rhythm with the atria.  °There are three types of atrial fibrillation:  °· Paroxysmal. Paroxysmal atrial fibrillation starts suddenly and stops on its own within a week. °· Persistent. Persistent atrial fibrillation lasts for more than a week. It may stop on its own or with treatment. °· Permanent. Permanent atrial fibrillation does not go away. Episodes of atrial fibrillation may lead to permanent atrial fibrillation. °Atrial fibrillation can prevent your heart from pumping blood normally. It increases your risk of stroke and can lead to heart failure.  °CAUSES  °· Heart conditions, including a heart attack, heart failure, coronary artery disease, and heart valve conditions.   °· Inflammation of the sac that surrounds the heart (pericarditis). °· Blockage of an artery in the lungs (pulmonary embolism). °· Pneumonia or other infections. °· Chronic lung disease. °· Thyroid problems, especially if the thyroid is overactive (hyperthyroidism). °· Caffeine, excessive alcohol use, and use  of some illegal drugs.   °· Use of some medicines, including certain decongestants and diet pills. °· Heart surgery.   °· Birth defects.   °Sometimes, no cause can be found. When this happens, the atrial fibrillation is called lone atrial fibrillation. The risk of complications from atrial fibrillation increases if you have lone atrial fibrillation and you are age 60 years or older. °RISK FACTORS °· Heart failure. °· Coronary artery disease. °· Diabetes mellitus.   °· High blood pressure (hypertension).   °· Obesity.   °· Other arrhythmias.   °· Increased age. °SIGNS AND SYMPTOMS  °· A feeling that your heart is beating rapidly or irregularly.   °· A feeling of discomfort or pain in your chest.   °· Shortness of breath.   °· Sudden light-headedness or weakness.   °· Getting tired easily when exercising.   °· Urinating more often than normal (mainly when atrial fibrillation first begins).   °In paroxysmal atrial fibrillation, symptoms may start and suddenly stop. °DIAGNOSIS  °Your health care provider may be able to detect atrial fibrillation when taking your pulse. Your health care provider may have you take a test called an ambulatory electrocardiogram (ECG). An ECG records your heartbeat patterns over a 24-hour period. You may also have other tests, such as: °· Transthoracic echocardiogram (TTE). During echocardiography, sound waves are used to evaluate how blood flows through your heart. °· Transesophageal echocardiogram (TEE). °· Stress test. There is more than one type of stress test. If a stress test is needed, ask your health care provider about which type is best for you. °· Chest X-ray exam. °· Blood tests. °· Computed tomography (CT). °TREATMENT  °Treatment may include: °· Treating any underlying conditions. For example, if you   have an overactive thyroid, treating the condition may correct atrial fibrillation. °· Taking medicine. Medicines may be given to control a rapid heart rate or to prevent blood  clots, heart failure, or a stroke. °· Having a procedure to correct the rhythm of the heart: °¨ Electrical cardioversion. During electrical cardioversion, a controlled, low-energy shock is delivered to the heart through your skin. If you have chest pain, very low blood pressure, or sudden heart failure, this procedure may need to be done as an emergency. °¨ Catheter ablation. During this procedure, heart tissues that send the signals that cause atrial fibrillation are destroyed. °¨ Surgical ablation. During this surgery, thin lines of heart tissue that carry the abnormal signals are destroyed. This procedure can either be an open-heart surgery or a minimally invasive surgery. With the minimally invasive surgery, small cuts are made to access the heart instead of a large opening. °¨ Pulmonary venous isolation. During this surgery, tissue around the veins that carry blood from the lungs (pulmonary veins) is destroyed. This tissue is thought to carry the abnormal signals. °HOME CARE INSTRUCTIONS  °· Take medicines only as directed by your health care provider. Some medicines can make atrial fibrillation worse or recur. °· If blood thinners were prescribed by your health care provider, take them exactly as directed. Too much blood-thinning medicine can cause bleeding. If you take too little, you will not have the needed protection against stroke and other problems. °· Perform blood tests at home if directed by your health care provider. Perform blood tests exactly as directed. °· Quit smoking if you smoke. °· Do not drink alcohol. °· Do not drink caffeinated beverages such as coffee, soda, and some teas. You may drink decaffeinated coffee, soda, or tea.   °· Maintain a healthy weight. Do not use diet pills unless your health care provider approves. They may make heart problems worse.   °· Follow diet instructions as directed by your health care provider. °· Exercise regularly as directed by your health care  provider. °· Keep all follow-up visits as directed by your health care provider. This is important. °PREVENTION  °The following substances can cause atrial fibrillation to recur:  °· Caffeinated beverages. °· Alcohol. °· Certain medicines, especially those used for breathing problems. °· Certain herbs and herbal medicines, such as those containing ephedra or ginseng. °· Illegal drugs, such as cocaine and amphetamines. °Sometimes medicines are given to prevent atrial fibrillation from recurring. Proper treatment of any underlying condition is also important in helping prevent recurrence.  °SEEK MEDICAL CARE IF: °· You notice a change in the rate, rhythm, or strength of your heartbeat. °· You suddenly begin urinating more frequently. °· You tire more easily when exerting yourself or exercising. °SEEK IMMEDIATE MEDICAL CARE IF:  °· You have chest pain, abdominal pain, sweating, or weakness. °· You feel nauseous. °· You have shortness of breath. °· You suddenly have swollen feet and ankles. °· You feel dizzy. °· Your face or limbs feel numb or weak. °· You have a change in your vision or speech. °MAKE SURE YOU:  °· Understand these instructions. °· Will watch your condition. °· Will get help right away if you are not doing well or get worse. °Document Released: 08/18/2005 Document Revised: 01/02/2014 Document Reviewed: 09/28/2012 °[employer]® Patient Information ©2015 [employer]. This information is not intended to replace advice given to you by your health care provider. Make sure you discuss any questions you have with your health care provider. ° °Information on my medicine -   ELIQUIS® (apixaban) ° °This medication education was reviewed with me or my healthcare representative as part of my discharge preparation.  ° °Why was Eliquis® prescribed for you? °Eliquis® was prescribed for you to reduce the risk of a blood clot forming that can cause a stroke if you have a medical condition called atrial fibrillation (a type  of irregular heartbeat). ° °What do You need to know about Eliquis® ? °Take your Eliquis® TWICE DAILY - one tablet in the morning and one tablet in the evening with or without food. If you have difficulty swallowing the tablet whole please discuss with your pharmacist how to take the medication safely. ° °Take Eliquis® exactly as prescribed by your doctor and DO NOT stop taking Eliquis® without talking to the doctor who prescribed the medication.  Stopping may increase your risk of developing a stroke.  Refill your prescription before you run out. ° °After discharge, you should have regular check-up appointments with your healthcare provider that is prescribing your Eliquis®.  In the future your dose may need to be changed if your kidney function or weight changes by a significant amount or as you get older. ° °What do you do if you miss a dose? °If you miss a dose, take it as soon as you remember on the same day and resume taking twice daily.  Do not take more than one dose of ELIQUIS at the same time to make up a missed dose. ° °Important Safety Information °A possible side effect of Eliquis® is bleeding. You should call your healthcare provider right away if you experience any of the following: °? Bleeding from an injury or your nose that does not stop. °? Unusual colored urine (red or dark brown) or unusual colored stools (red or black). °? Unusual bruising for unknown reasons. °? A serious fall or if you hit your head (even if there is no bleeding). ° °Some medicines may interact with Eliquis® and might increase your risk of bleeding or clotting while on Eliquis®. To help avoid this, consult your healthcare provider or pharmacist prior to using any new prescription or non-prescription medications, including herbals, vitamins, non-steroidal anti-inflammatory drugs (NSAIDs) and supplements. ° °This website has more information on Eliquis® (apixaban): http://www.eliquis.com/eliquis/home °

## 2015-04-19 MED ORDER — SODIUM CHLORIDE 0.9 % IJ SOLN: 3.0000 mL | INTRAMUSCULAR | Status: DC | PRN

## 2015-04-19 MED ORDER — POTASSIUM CHLORIDE CRYS ER 20 MEQ PO TBCR
40.0000 meq | EXTENDED_RELEASE_TABLET | Freq: Once | ORAL | Status: AC
  Administered 2015-04-19: 40 meq via ORAL
  Filled 2015-04-19: qty 2

## 2015-04-19 MED ORDER — SOTALOL HCL 80 MG PO TABS
80.0000 mg | ORAL_TABLET | Freq: Two times a day (BID) | ORAL | Status: DC
  Administered 2015-04-19 – 2015-04-22 (×7): 80 mg via ORAL
  Filled 2015-04-19 (×8): qty 1

## 2015-04-19 MED ORDER — SODIUM CHLORIDE 0.9 % IV SOLN: 250.0000 mL | INTRAVENOUS | Status: DC

## 2015-04-19 MED ORDER — SODIUM CHLORIDE 0.9 % IJ SOLN
3.0000 mL | Freq: Two times a day (BID) | INTRAMUSCULAR | Status: DC
  Administered 2015-04-19 – 2015-04-22 (×5): 3 mL via INTRAVENOUS

## 2015-04-19 MED ORDER — ZOLPIDEM TARTRATE 5 MG PO TABS
5.0000 mg | ORAL_TABLET | Freq: Every evening | ORAL | Status: DC | PRN
  Administered 2015-04-20: 5 mg via ORAL
  Filled 2015-04-19 (×2): qty 1

## 2015-04-19 NOTE — Progress Notes (Signed)
Patient Name: [name] Date of Encounter: 04/19/2015  Primary Cardiologist: Dr. Gwenlyn Found   Active Problems:   PAF (paroxysmal atrial fibrillation)   Atrial fibrillation with rapid ventricular response    SUBJECTIVE  Denies any CP or SOB. HR jumped to 170s while going to bathroom this morning.   CURRENT MEDS . apixaban  5 mg Oral BID  . digoxin  0.125 mg Oral Daily  . ezetimibe  10 mg Oral Daily  . metoprolol tartrate  50 mg Oral TID    OBJECTIVE  Filed Vitals:   04/18/15 1052 04/18/15 1510 04/18/15 2157 04/19/15 0603  BP: 120/77 111/65 117/73 114/56  Pulse: 102 106 103 84  Temp:  97.9 F (36.6 C) 98.2 F (36.8 C) 98.2 F (36.8 C)  TempSrc:  Oral Oral Oral  Resp:   18 18  Height:      Weight:    191 lb 5.8 oz (86.8 kg)  SpO2:  96% 99% 97%    Intake/Output Summary (Last 24 hours) at 04/19/15 0946 Last data filed at 04/19/15 0800  Gross per 24 hour  Intake    240 ml  Output      0 ml  Net    240 ml   Filed Weights   04/17/15 1421 04/18/15 0456 04/19/15 0603  Weight: 192 lb (87.091 kg) 190 lb 6.4 oz (86.365 kg) 191 lb 5.8 oz (86.8 kg)    PHYSICAL EXAM  General: Pleasant, NAD. Neuro: Alert and oriented X 3. Moves all extremities spontaneously. Psych: Normal affect. HEENT:  Normal  Neck: Supple without bruits or JVD. Lungs:  Resp regular and unlabored, CTA. Heart: irregular. no s3, s4, or murmurs. Abdomen: Soft, non-tender, non-distended, BS + x 4.  Extremities: No clubbing, cyanosis or edema. DP/PT/Radials 2+ and equal bilaterally.  Accessory Clinical Findings  CBC  Recent Labs  04/17/15 1136 04/18/15 0328  WBC 7.3 6.4  HGB 16.3* 15.7*  HCT 48.0* 46.3*  MCV 91.6 91.1  PLT 205 272   Basic Metabolic Panel  Recent Labs  04/17/15 1136 04/18/15 0328  NA 142 141  K 3.8 3.7  CL 104 107  CO2 25 25  GLUCOSE 105* 103*  BUN 22* 21*  CREATININE 1.24* 1.07*  CALCIUM 9.6 9.3   Liver Function Tests  Recent Labs  04/17/15 1136  AST 31    ALT 22  ALKPHOS 47  BILITOT 1.0  PROT 6.4*  ALBUMIN 3.7   Thyroid Function Tests  Recent Labs  04/18/15 0328  TSH 2.909    TELE A-fib with HR 90-100s, HR jumped to 170s with morning     ECG  No new EKG  Echocardiogram 04/18/2015  LV EF: 55% -  60%  ------------------------------------------------------------------- Indications:   Atrial fibrillation - 427.31.  ------------------------------------------------------------------- History:  PMH:  Atrial fibrillation. Risk factors: Hypertension.  ------------------------------------------------------------------- Study Conclusions  - Left ventricle: The cavity size was normal. Wall thickness was increased in a pattern of mild LVH. Systolic function was normal. The estimated ejection fraction was in the range of 55% to 60%. - Mitral valve: There was mild regurgitation. - Atrial septum: A patent foramen ovale cannot be excluded.    Radiology/Studies  Dg Chest Portable 1 View  04/17/2015   CLINICAL DATA:  Atrial fibrillation, shortness of breath, fatigue. Syncope. Symptoms for 1-2 days.  EXAM: PORTABLE CHEST - 1 VIEW  COMPARISON:  03/09/2015  FINDINGS: The heart size and mediastinal contours are within normal limits. Both lungs are clear. The visualized skeletal  structures are unremarkable.  IMPRESSION: No active disease.   Electronically Signed   By: Rolm Baptise M.D.   On: 04/17/2015 11:22    ASSESSMENT AND PLAN  81 yo with PMH of CAD, PVD, h/o PAF, HTN, HLD who presented on 04/17/2015 with palpitation, SOB, lightheadedness, weakness and presyncope. Noted to be in afib with RVR with HR 140s on arrival  1. PAF with RVR  - CHADSvasc 5, eliquis started on arrival  - could not tolerate diltiazem due to rash, verapmil cause tingling in extremities and blurred vision. Could not tolerate high dose due to extreme fatigue previously  - low dose digoxin added to help with rate control.   - HR continued to be  uncontrolled, amiodarone was considered but not started given her h/o multiple drug allergies  - continue metoprolol 50mg  TID.   - Echo shows normal EF, normal LA size. TSH normal  - placed on schedule for TEE DCCV with Dr. Marlou Porch around 2pm tomorrow.   2. CAD  - myoview 03/09/2015 low risk study with very small area of mild apicoseptal ischemia, cath 03/13/2015 minimal CAD, EF 55-60%  3. PVD 4. HTN 5. HLD  Signed, Woodward Ku Pager: [phone]

## 2015-04-20 ENCOUNTER — Inpatient Hospital Stay (HOSPITAL_COMMUNITY): Payer: Medicare Other

## 2015-04-20 ENCOUNTER — Encounter (HOSPITAL_COMMUNITY): Payer: Self-pay | Admitting: Anesthesiology

## 2015-04-20 ENCOUNTER — Encounter (HOSPITAL_COMMUNITY)
Admission: EM | Disposition: A | Discharge status: Home / Self Care | Payer: Self-pay | Source: Home / Self Care | Attending: Cardiology

## 2015-04-20 ENCOUNTER — Inpatient Hospital Stay (HOSPITAL_COMMUNITY): Payer: Medicare Other | Admitting: Anesthesiology

## 2015-04-20 DIAGNOSIS — I10 Essential (primary) hypertension: Secondary | ICD-10-CM

## 2015-04-20 DIAGNOSIS — I5031 Acute diastolic (congestive) heart failure: Secondary | ICD-10-CM

## 2015-04-20 HISTORY — PX: CARDIOVERSION: SHX1299

## 2015-04-20 HISTORY — PX: TEE WITHOUT CARDIOVERSION: SHX5443

## 2015-04-20 LAB — BASIC METABOLIC PANEL
ANION GAP: 8 (ref 5–15)
BUN: 16 mg/dL (ref 6–20)
CALCIUM: 9.5 mg/dL (ref 8.9–10.3)
CO2: 22 mmol/L (ref 22–32)
Chloride: 110 mmol/L (ref 101–111)
Creatinine, Ser: 0.92 mg/dL (ref 0.44–1.00)
GFR calc Af Amer: >60 mL/min (ref >60)
GFR, EST NON AFRICAN AMERICAN: 57 mL/min — AB (ref >60)
Glucose, Bld: 98 mg/dL (ref 65–99)
POTASSIUM: 4.6 mmol/L (ref 3.5–5.1)
SODIUM: 140 mmol/L (ref 135–145)

## 2015-04-20 SURGERY — ECHOCARDIOGRAM, TRANSESOPHAGEAL
Anesthesia: Monitor Anesthesia Care

## 2015-04-20 MED ORDER — MEPERIDINE HCL 25 MG/ML IJ SOLN: 6.2500 mg | INTRAMUSCULAR | Status: DC | PRN

## 2015-04-20 MED ORDER — MIDAZOLAM HCL 2 MG/2ML IJ SOLN: 0.5000 mg | Freq: Once | INTRAMUSCULAR | Status: DC | PRN

## 2015-04-20 MED ORDER — BUTAMBEN-TETRACAINE-BENZOCAINE 2-2-14 % EX AERO
INHALATION_SPRAY | CUTANEOUS | Status: DC | PRN
  Administered 2015-04-20: 2 via TOPICAL

## 2015-04-20 MED ORDER — PROMETHAZINE HCL 25 MG/ML IJ SOLN: 6.2500 mg | INTRAMUSCULAR | Status: DC | PRN

## 2015-04-20 MED ORDER — SODIUM CHLORIDE 0.9 % IV SOLN: INTRAVENOUS | Status: DC

## 2015-04-20 MED ORDER — FENTANYL CITRATE (PF) 100 MCG/2ML IJ SOLN: 25.0000 ug | INTRAMUSCULAR | Status: DC | PRN

## 2015-04-20 MED ORDER — SODIUM CHLORIDE 0.9 % IV SOLN
INTRAVENOUS | Status: DC | PRN
  Administered 2015-04-20: 14:00:00 via INTRAVENOUS

## 2015-04-20 MED ORDER — PROPOFOL 10 MG/ML IV BOLUS
INTRAVENOUS | Status: DC | PRN
  Administered 2015-04-20: 20 mg via INTRAVENOUS
  Administered 2015-04-20 (×3): 30 mg via INTRAVENOUS
  Administered 2015-04-20: 20 mg via INTRAVENOUS

## 2015-04-20 MED ORDER — METOPROLOL TARTRATE 50 MG PO TABS
50.0000 mg | ORAL_TABLET | Freq: Two times a day (BID) | ORAL | Status: DC
  Administered 2015-04-20 – 2015-04-22 (×4): 50 mg via ORAL
  Filled 2015-04-20 (×4): qty 1

## 2015-04-20 NOTE — Interval H&P Note (Signed)
History and Physical Interval Note:  04/20/2015 1:39 PM  [name]  has presented today for surgery, with the diagnosis of AFIB  The various methods of treatment have been discussed with the patient and family. After consideration of risks, benefits and other options for treatment, the patient has consented to  Procedure(s): TRANSESOPHAGEAL ECHOCARDIOGRAM (TEE) (N/A) CARDIOVERSION (N/A) as a surgical intervention .  The patient's history has been reviewed, patient examined, no change in status, stable for surgery.  I have reviewed the patient's chart and labs.  Questions were answered to the patient's satisfaction.     Ishaaq Penna

## 2015-04-20 NOTE — Progress Notes (Signed)
  Echocardiogram Echocardiogram Transesophageal has been performed.  Jennette Dubin 04/20/2015, 3:01 PM

## 2015-04-20 NOTE — Progress Notes (Signed)
Discussed metoprolol dosing with Dr. Tamala Julian given HR 50-60 post DCCV. Decrease dosing to BID with hold parameters. Almendra Loria PA-C

## 2015-04-20 NOTE — Transfer of Care (Signed)
Immediate Anesthesia Transfer of Care Note  Patient: [name]  Procedure(s) Performed: Procedure(s): TRANSESOPHAGEAL ECHOCARDIOGRAM (TEE) (N/A) CARDIOVERSION (N/A)  Patient Location: Endoscopy Unit  Anesthesia Type:MAC  Level of Consciousness: awake, alert  and oriented  Airway & Oxygen Therapy: Patient Spontanous Breathing and Patient connected to nasal cannula oxygen  Post-op Assessment: Report given to RN and Post -op Vital signs reviewed and stable  Post vital signs: Reviewed and stable  Last Vitals:  Filed Vitals:   04/20/15 1254  BP: 119/81  Pulse: 100  Temp: 36.5 C  Resp: 10    Complications: No apparent anesthesia complications

## 2015-04-20 NOTE — H&P (View-Only) (Signed)
Patient: [name] / Admit Date: 04/17/2015 / Date of Encounter: 04/20/2015, 9:51 AM   Subjective: Feels generally tired, similar to what prompted admission - other than that, no CP or SOB.   Objective: Telemetry: atrial fib rates improved around 100-110. QTC 475 per tele. Physical Exam: Blood pressure 111/71, pulse 84, temperature 98 F (36.7 C), temperature source Oral, resp. rate 18, height 5\' 6"  (1.676 m), weight 190 lb 9.6 oz (86.456 kg), SpO2 97 %. General: Well developed, well nourished elderly WF in no acute distress. Head: Normocephalic, atraumatic, sclera non-icteric, no xanthomas, nares are without discharge. Neck: JVP not elevated. Lungs: Clear bilaterally to auscultation without wheezes, rales, or rhonchi. Breathing is unlabored. Heart: Irregularly irregular, controlled rate, S1 S2 without murmurs, rubs, or gallops.  Abdomen: Soft, non-tender, non-distended with normoactive bowel sounds. No rebound/guarding. Extremities: No clubbing or cyanosis. No edema. Distal pedal pulses are 2+ and equal bilaterally. Neuro: Alert and oriented X 3. Moves all extremities spontaneously. Psych:  Responds to questions appropriately with a normal affect.  No intake or output data in the 24 hours ending 04/20/15 0951  Inpatient Medications:  . apixaban  5 mg Oral BID  . ezetimibe  10 mg Oral Daily  . metoprolol tartrate  50 mg Oral TID  . sodium chloride  3 mL Intravenous Q12H  . sotalol  80 mg Oral Q12H   Infusions:  . sodium chloride 1,000 mL (04/17/15 1049)  . sodium chloride    . sodium chloride      Labs:  Recent Labs  04/17/15 1136 04/18/15 0328  NA 142 141  K 3.8 3.7  CL 104 107  CO2 25 25  GLUCOSE 105* 103*  BUN 22* 21*  CREATININE 1.24* 1.07*  CALCIUM 9.6 9.3    Recent Labs  04/17/15 1136  AST 31  ALT 22  ALKPHOS 47  BILITOT 1.0  PROT 6.4*  ALBUMIN 3.7    Recent Labs  04/17/15 1136 04/18/15 0328  WBC 7.3 6.4  HGB 16.3* 15.7*  HCT 48.0*  46.3*  MCV 91.6 91.1  PLT 205 179   Radiology/Studies:  Dg Chest Portable 1 View  04/17/2015   CLINICAL DATA:  Atrial fibrillation, shortness of breath, fatigue. Syncope. Symptoms for 1-2 days.  EXAM: PORTABLE CHEST - 1 VIEW  COMPARISON:  03/09/2015  FINDINGS: The heart size and mediastinal contours are within normal limits. Both lungs are clear. The visualized skeletal structures are unremarkable.  IMPRESSION: No active disease.   Electronically Signed   By: Rolm Baptise M.D.   On: 04/17/2015 11:22     Assessment and Plan  81 yo with PMH of CAD, PVD, h/o PAF, HTN, HLD who presented on 04/17/2015 with palpitations, SOB, lightheadedness, weakness and presyncope. Noted to be in afib with RVR with HR 140s on arrival.  1. PAF with RVR - CHADSvasc 5 - Eliquis started on arrival 04/17/15 - Could not previously tolerate diltiazem due to rash, verapmil caused tingling in extremities and blurred vision. Could not tolerate high dose due to extreme fatigue previously, amiodarone was considered but not started given her h/o multiple drug allergies - low dose digoxin added to help with rate control then d/c'd once sotalol added yesterday - will clarify plan for beta blockers this AM with Dr. Tamala Julian prior to administration - prior sinus rate in 60s  - 2D Echo 04/18/15: mild LVH, EF 55-60%, mild MR - TEE/DCCV today at 2pm with Dr. Marlou Porch  2. Minimal CAD by cath 03/13/15 (following  small abnormality on nuc), EF 55-60%. 3. PVD 4. HTN 5. HLD  Signed, Melina Copa PA-C Pager: [phone]

## 2015-04-20 NOTE — Care Management Note (Signed)
Case Management Note  Patient Details  Name: [name] MRN: [id number] Date of Birth: April 12, 1933  Subjective/Objective:  Pt admitted for PAF. On Eliquis.                   Action/Plan: Benefits check completed and pt aware of cost for 42.00. Pt will need Rx for 30 day free and the original Rx with refills. Pt uses [facility] and medication is available. Plan is for cardioversion today. CM will continue to monitor for additional needs.   [name], RN 04/20/2015, 10:58 AM

## 2015-04-20 NOTE — CV Procedure (Signed)
    Electrical Cardioversion Procedure Note [name] [id number] 22-Nov-1932  Procedure: Electrical Cardioversion Indications:  Atrial Fibrillation  Time Out: Verified patient identification, verified procedure,medications/allergies/relevent history reviewed, required imaging and test results available.  Performed  Procedure Details  The patient was NPO after midnight. Anesthesia was administered at the beside  by Dr. Glennon Mac with propofol.  Cardioversion was performed with synchronized biphasic defibrillation via AP pads with 120 joules.  1 attempt(s) were performed.  The patient converted to normal sinus rhythm. The patient tolerated the procedure well   IMPRESSION:  Successful cardioversion of atrial fibrillation  TEE - normal EF, negative bubble study. No thrombus.     [name], [place] 04/20/2015, 2:29 PM

## 2015-04-20 NOTE — Progress Notes (Signed)
Patient: [name] / Admit Date: 04/17/2015 / Date of Encounter: 04/20/2015, 9:51 AM   Subjective: Feels generally tired, similar to what prompted admission - other than that, no CP or SOB.   Objective: Telemetry: atrial fib rates improved around 100-110. QTC 475 per tele. Physical Exam: Blood pressure 111/71, pulse 84, temperature 98 F (36.7 C), temperature source Oral, resp. rate 18, height 5\' 6"  (1.676 m), weight 190 lb 9.6 oz (86.456 kg), SpO2 97 %. General: Well developed, well nourished elderly WF in no acute distress. Head: Normocephalic, atraumatic, sclera non-icteric, no xanthomas, nares are without discharge. Neck: JVP not elevated. Lungs: Clear bilaterally to auscultation without wheezes, rales, or rhonchi. Breathing is unlabored. Heart: Irregularly irregular, controlled rate, S1 S2 without murmurs, rubs, or gallops.  Abdomen: Soft, non-tender, non-distended with normoactive bowel sounds. No rebound/guarding. Extremities: No clubbing or cyanosis. No edema. Distal pedal pulses are 2+ and equal bilaterally. Neuro: Alert and oriented X 3. Moves all extremities spontaneously. Psych:  Responds to questions appropriately with a normal affect.  No intake or output data in the 24 hours ending 04/20/15 0951  Inpatient Medications:  . apixaban  5 mg Oral BID  . ezetimibe  10 mg Oral Daily  . metoprolol tartrate  50 mg Oral TID  . sodium chloride  3 mL Intravenous Q12H  . sotalol  80 mg Oral Q12H   Infusions:  . sodium chloride 1,000 mL (04/17/15 1049)  . sodium chloride    . sodium chloride      Labs:  Recent Labs  04/17/15 1136 04/18/15 0328  NA 142 141  K 3.8 3.7  CL 104 107  CO2 25 25  GLUCOSE 105* 103*  BUN 22* 21*  CREATININE 1.24* 1.07*  CALCIUM 9.6 9.3    Recent Labs  04/17/15 1136  AST 31  ALT 22  ALKPHOS 47  BILITOT 1.0  PROT 6.4*  ALBUMIN 3.7    Recent Labs  04/17/15 1136 04/18/15 0328  WBC 7.3 6.4  HGB 16.3* 15.7*  HCT 48.0*  46.3*  MCV 91.6 91.1  PLT 205 179   Radiology/Studies:  Dg Chest Portable 1 View  04/17/2015   CLINICAL DATA:  Atrial fibrillation, shortness of breath, fatigue. Syncope. Symptoms for 1-2 days.  EXAM: PORTABLE CHEST - 1 VIEW  COMPARISON:  03/09/2015  FINDINGS: The heart size and mediastinal contours are within normal limits. Both lungs are clear. The visualized skeletal structures are unremarkable.  IMPRESSION: No active disease.   Electronically Signed   By: Rolm Baptise M.D.   On: 04/17/2015 11:22     Assessment and Plan  80 yo with PMH of CAD, PVD, h/o PAF, HTN, HLD who presented on 04/17/2015 with palpitations, SOB, lightheadedness, weakness and presyncope. Noted to be in afib with RVR with HR 140s on arrival.  1. PAF with RVR - CHADSvasc 5 - Eliquis started on arrival 04/17/15 - Could not previously tolerate diltiazem due to rash, verapmil caused tingling in extremities and blurred vision. Could not tolerate high dose due to extreme fatigue previously, amiodarone was considered but not started given her h/o multiple drug allergies - low dose digoxin added to help with rate control then d/c'd once sotalol added yesterday - will clarify plan for beta blockers this AM with Dr. Tamala Julian prior to administration - prior sinus rate in 60s  - 2D Echo 04/18/15: mild LVH, EF 55-60%, mild MR - TEE/DCCV today at 2pm with Dr. Marlou Porch  2. Minimal CAD by cath 03/13/15 (following  small abnormality on nuc), EF 55-60%. 3. PVD 4. HTN 5. HLD  Signed, Melina Copa PA-C Pager: [phone]

## 2015-04-20 NOTE — Anesthesia Postprocedure Evaluation (Signed)
  Anesthesia Post-op Note  Patient: [name]  Procedure(s) Performed: Procedure(s): TRANSESOPHAGEAL ECHOCARDIOGRAM (TEE) (N/A) CARDIOVERSION (N/A)  Patient Location: Endoscopy Unit  Anesthesia Type:MAC  Level of Consciousness: awake, alert  and oriented  Airway and Oxygen Therapy: Patient Spontanous Breathing and Patient connected to nasal cannula oxygen  Post-op Pain: none  Post-op Assessment: Post-op Vital signs reviewed, Patient's Cardiovascular Status Stable and Respiratory Function Stable              Post-op Vital Signs: Reviewed and stable  Last Vitals:  Filed Vitals:   04/20/15 1436  BP: 96/52  Pulse: 62  Temp:   Resp: 17    Complications: No apparent anesthesia complications

## 2015-04-20 NOTE — Anesthesia Preprocedure Evaluation (Addendum)
Anesthesia Evaluation  Patient identified by MRN, date of birth, ID band Patient awake    Reviewed: Allergy & Precautions, NPO status , Patient's Chart, lab work & pertinent test results  History of Anesthesia Complications Negative for: history of anesthetic complications  Airway Mallampati: II  TM Distance: >3 FB Neck ROM: Full    Dental  (+) Teeth Intact, Dental Advisory Given   Pulmonary neg pulmonary ROS,  breath sounds clear to auscultation        Cardiovascular hypertension, Pt. on medications and Pt. on home beta blockers + angina + Peripheral Vascular Disease - CAD Rhythm:Irregular Rate:Normal  04/18/15 ECHO: EF 55-60%, mild MR 7/16 cath: no sig coronary disease   Neuro/Psych negative neurological ROS     GI/Hepatic Neg liver ROS, hiatal hernia, GERD-  Medicated and Controlled,crohn's   Endo/Other  Morbid obesity  Renal/GU negative Renal ROS     Musculoskeletal  (+) Arthritis -,   Abdominal (+) + obese,   Peds  Hematology negative hematology ROS (+)   Anesthesia Other Findings   Reproductive/Obstetrics                        Anesthesia Physical Anesthesia Plan  ASA: III  Anesthesia Plan: MAC   Post-op Pain Management:    Induction: Intravenous  Airway Management Planned: Natural Airway and Nasal Cannula  Additional Equipment:   Intra-op Plan:   Post-operative Plan:   Informed Consent: I have reviewed the patients History and Physical, chart, labs and discussed the procedure including the risks, benefits and alternatives for the proposed anesthesia with the patient or authorized representative who has indicated his/her understanding and acceptance.   Dental advisory given  Plan Discussed with: CRNA, Anesthesiologist and Surgeon  Anesthesia Plan Comments: (Plan routine monitors, MAC)       Anesthesia Quick Evaluation

## 2015-04-21 NOTE — Progress Notes (Signed)
Patient Name: [name] Date of Encounter: 04/21/2015  Primary Cardiologist: Dr. Gwenlyn Found   Active Problems:   PAF (paroxysmal atrial fibrillation)   Atrial fibrillation with rapid ventricular response    SUBJECTIVE  Denies any CP or SOB. Feels good since cardioversion yesterday.  CURRENT MEDS . apixaban  5 mg Oral BID  . ezetimibe  10 mg Oral Daily  . metoprolol tartrate  50 mg Oral BID  . sodium chloride  3 mL Intravenous Q12H  . sotalol  80 mg Oral Q12H    OBJECTIVE  Filed Vitals:   04/20/15 1500 04/20/15 1514 04/20/15 2025 04/21/15 0500  BP:  128/67 121/49 129/60  Pulse: 53 54 65 63  Temp:  97.5 F (36.4 C) 98.2 F (36.8 C) 98.4 F (36.9 C)  TempSrc:  Oral Oral Oral  Resp: 19 18 18 18   Height:      Weight:      SpO2: 98% 100% 99% 97%    Intake/Output Summary (Last 24 hours) at 04/21/15 0916 Last data filed at 04/21/15 0745  Gross per 24 hour  Intake    320 ml  Output      0 ml  Net    320 ml   Filed Weights   04/19/15 0603 04/20/15 0500 04/20/15 1254  Weight: 191 lb 5.8 oz (86.8 kg) 190 lb 9.6 oz (86.456 kg) 190 lb (86.183 kg)    PHYSICAL EXAM  General: Pleasant, NAD. Neuro: Alert and oriented X 3. Moves all extremities spontaneously. Psych: Normal affect. HEENT:  Normal  Neck: Supple without bruits or JVD. Lungs:  Resp regular and unlabored. CTA with bibasilar rale Heart: RRR. no s3, s4, or murmurs. Abdomen: Soft, non-tender, non-distended, BS + x 4.  Extremities: No clubbing, cyanosis or edema. DP/PT/Radials 2+ and equal bilaterally.  Accessory Clinical Findings  CBC No results for input(s): WBC, NEUTROABS, HGB, HCT, MCV, PLT in the last 72 hours. Basic Metabolic Panel  Recent Labs  04/20/15 1205  NA 140  K 4.6  CL 110  CO2 22  GLUCOSE 98  BUN 16  CREATININE 0.92  CALCIUM 9.5    TELE NSR with HR 50-60s    ECG  No new EKG  Echocardiogram 04/18/2015  LV EF: 55% -   60%  ------------------------------------------------------------------- Indications:   Atrial fibrillation - 427.31.  ------------------------------------------------------------------- History:  PMH:  Atrial fibrillation. Risk factors: Hypertension.  ------------------------------------------------------------------- Study Conclusions  - Left ventricle: The cavity size was normal. Wall thickness was increased in a pattern of mild LVH. Systolic function was normal. The estimated ejection fraction was in the range of 55% to 60%. - Mitral valve: There was mild regurgitation. - Atrial septum: A patent foramen ovale cannot be excluded.    Radiology/Studies  Dg Chest Portable 1 View  04/17/2015   CLINICAL DATA:  Atrial fibrillation, shortness of breath, fatigue. Syncope. Symptoms for 1-2 days.  EXAM: PORTABLE CHEST - 1 VIEW  COMPARISON:  03/09/2015  FINDINGS: The heart size and mediastinal contours are within normal limits. Both lungs are clear. The visualized skeletal structures are unremarkable.  IMPRESSION: No active disease.   Electronically Signed   By: Rolm Baptise M.D.   On: 04/17/2015 11:22    ASSESSMENT AND PLAN  81 yo with PMH of CAD, PVD, h/o PAF, HTN, HLD who presented on 04/17/2015 with palpitation, SOB, lightheadedness, weakness and presyncope. Noted to be in afib with RVR with HR 140s on arrival  1. PAF with RVR  - CHADSvasc 5, eliquis  started on arrival  - could not tolerate diltiazem due to rash, verapmil cause tingling in extremities and blurred vision. Could not tolerate high dose due to extreme fatigue previously  - started on low dose digoxin, which has since been discontinued on 8/18 after starting sotalol along with metoprolol   - s/p TEE DCCV on 04/20/2015. Metoprolol dose reduced to 50mg  BID.   - Echo shows normal EF, normal LA size. TSH normal  - Transient borderline hypotension with SBP in 90s yesterday, however recovered since. Metoprolol decreased  down to 50mg  BID along with Sotalol 80mg  q12HR. Scheduled to receive the 6th dose of sotalol tonight. Will check EKG to monitor QTc level. Likely discharge tomorrow if QTc stable.    - does have bibasilar rale on exam today, ?atelesis vs fluid from a-fib RVR, however pt denies any SOB, will monitor for now.   2. CAD  - myoview 03/09/2015 low risk study with very small area of mild apicoseptal ischemia, cath 03/13/2015 minimal CAD, EF 55-60%  3. PVD 4. HTN 5. HLD  Signed, Woodward Ku Pager: [phone]   The patient was seen, examined and discussed with Almyra Deforest, PA-C and I agree with the above.   Remain in SR on metoprolol and sotalol, feels well, started to walk with some fatigue, but overall ok. I reviewed ECG, there is no QT prolongation. We will continue to monitor, if ok, anticipated discharge in the am.  Dorothy Spark 04/21/2015

## 2015-04-22 ENCOUNTER — Other Ambulatory Visit: Payer: Self-pay | Admitting: Physician Assistant

## 2015-04-22 DIAGNOSIS — Z7901 Long term (current) use of anticoagulants: Secondary | ICD-10-CM

## 2015-04-22 DIAGNOSIS — Z5181 Encounter for therapeutic drug level monitoring: Secondary | ICD-10-CM

## 2015-04-22 DIAGNOSIS — Z79899 Other long term (current) drug therapy: Secondary | ICD-10-CM

## 2015-04-22 MED ORDER — METOPROLOL TARTRATE 50 MG PO TABS: 50.0000 mg | ORAL_TABLET | Freq: Two times a day (BID) | ORAL | Status: DC

## 2015-04-22 MED ORDER — APIXABAN 5 MG PO TABS
5.0000 mg | ORAL_TABLET | Freq: Two times a day (BID) | ORAL | Status: DC
Start: 2015-04-22 — End: 2015-04-22

## 2015-04-22 MED ORDER — SOTALOL HCL 80 MG PO TABS: 80.0000 mg | ORAL_TABLET | Freq: Two times a day (BID) | ORAL | Status: DC

## 2015-04-22 MED ORDER — APIXABAN 5 MG PO TABS: 5.0000 mg | ORAL_TABLET | Freq: Two times a day (BID) | ORAL | Status: DC

## 2015-04-22 NOTE — Discharge Summary (Signed)
Discharge Summary   Patient ID: [name],  MRN: [id number], DOB/AGE: 10-25-1932 81 y.o.  Admit date: 04/17/2015 Discharge date: 04/22/2015  Primary Care Provider: [name] Primary Cardiologist: Dr. Claiborne Billings  Discharge Diagnoses Principal Problem:   Atrial fibrillation with rapid ventricular response Active Problems:   HYPERCHOLESTEROLEMIA   HTN (hypertension)   Essential hypertension   Allergies Allergies  Allergen Reactions  . Acetaminophen-Codeine   . Amlodipine Besylate     REACTION: swelling  . Diltiazem Hcl     REACTION: rash  . Hydrocodone-Acetaminophen     nausea  . Irbesartan-Hydrochlorothiazide     REACTION: Dizziness  . Lisinopril     cough  . Omeprazole Nausea Only and Other (See Comments)    Dizziness, joint pain, weakness in legs, cramps in legs, stomach burned  . Propoxyphene N-Acetaminophen     Headache, and blotchy face  . Valsartan     REACTION: blurred vision  . Warfarin Sodium     REACTION: body aches and bleeding  . Verapamil Nausea Only, Palpitations and Other (See Comments)    Headaches     Procedures  Echocardiogram 04/18/2015 LV EF: 55% -  60%  ------------------------------------------------------------------- Indications:   Atrial fibrillation - 427.31.  ------------------------------------------------------------------- History:  PMH:  Atrial fibrillation. Risk factors: Hypertension.  ------------------------------------------------------------------- Study Conclusions  - Left ventricle: The cavity size was normal. Wall thickness was increased in a pattern of mild LVH. Systolic function was normal. The estimated ejection fraction was in the range of 55% to 60%. - Mitral valve: There was mild regurgitation. - Atrial septum: A patent foramen ovale cannot be excluded.    TEE DCCV 04/20/2015  Procedure: Electrical Cardioversion Indications: Atrial Fibrillation  Time Out: Verified patient identification,  verified procedure,medications/allergies/relevent history reviewed, required imaging and test results available. Performed  IMPRESSION:  Successful cardioversion of atrial fibrillation  TEE - normal EF, negative bubble study. No thrombus.     Hospital Course  The patient is a 81 year old female with past medical history of CAD, PAF, PVD, hypertension, and hyperlipidemia who presented to [facility] on [facility]/19/2015 with complaint of palpitation, shortness of breath, lightheadedness, weakness and presyncope. She had a history of paroxysmal atrial fibrillation for which she never had to be cardioverted for. Her last Myoview was healing July 2016 which was low risk nuclear study with very small area of mild apical septal ischemia. She underwent cardiac catheterization on 03/13/2015 which showed EF 55-60%, no significant coronary artery disease.   She states her palpitations started roughly 2 days ago. He tries to take extra metoprolol to help with her symptom, however her palpitation persisted. On the morning of admission, she became symptomatic with associated presyncope, lightheadedness and shortness breath and weakness. On arrival to [facility] ED, patient was normotensive, however tachycardic with heart rate of 140s. EKG revealed atrial fibrillation with RVR. She was given labetalol 20 mg once by EMS and metoprolol 5 mg IV in the ED. She was placed on metoprolol tartrate and eliquis. Initially, patient had difficulty controlling her heart rate. She could not tolerate the diltiazem due to history of rash, whereas the verapmil caused tingling in the extremities and blurred vision. Her metoprolol was initially increased to 50 mg 3 times a day, however this could not control her heart rate. A digoxin was also admitted, but later removed as it did not help controlling her HR. Echocardiogram obtained on 04/18/2015 showed EF 55-60%, mild MR, patent foramen ovale cannot be excluded. She was started on  sotalol  on 04/19/2015 prior to TEE cardioversion. She underwent successful TEE cardioversion on 8/19. Post cardioversion, she has some bradycardia and her metoprolol was decreased to 50 mg twice a day. She also had some transient hypotension with systolic blood pressure in the 90s however her blood pressure eventually improved.  She was seen in the morning of 04/22/2015, after 6 doses of sotalol, she was doing well without any sign of QT prolongation. She is deemed stable for discharge from cardiology perspective. Because the fact her pharmacy is closed today, I have given her 2 prescriptions for all the new medication including metoprolol, sotalol, and eliquis. I have given her 7 day supplies for metoprolol and sotalol for this weekend (as her regular pharmacy closed on Sunday) along with half year supply sent to her regular pharmacy. As for her eliquis, she was given 30 day supply with free coupon along with one year supply sent to her pharmacy.  Of note, patient was scheduled to have peripheral arteriogram by Dr. Gwenlyn Found, this will likely be delayed as her anticoagulation cannot be interrupted for 4 weeks after cardioversion.   Discharge Vitals Blood pressure 125/65, pulse 60, temperature 98.6 F (37 C), temperature source Oral, resp. rate 19, height 5\' 6"  (1.676 m), weight 194 lb 9.6 oz (88.27 kg), SpO2 100 %.  Filed Weights   04/20/15 0500 04/20/15 1254 04/22/15 0450  Weight: 190 lb 9.6 oz (86.456 kg) 190 lb (86.183 kg) 194 lb 9.6 oz (88.27 kg)    Labs  Basic Metabolic Panel  Recent Labs  04/20/15 1205  NA 140  K 4.6  CL 110  CO2 22  GLUCOSE 98  BUN 16  CREATININE 0.92  CALCIUM 9.5    Disposition  Pt is being discharged home today in good condition.  Follow-up Plans & Appointments      Follow-up Information    Follow up with Troy Sine, MD.   Specialty:  Cardiology   Why:  Office scheduler will contact you to arrange followup, please give [place] a call if you do not hear  from [place] in 2 business days   Contact information:   [address] [place] [address] [place] [address] [phone]       Discharge Medications    Medication List    STOP taking these medications        Aliskiren-Hydrochlorothiazide 300-25 MG Tabs  Commonly known as:  TEKTURNA HCT     aspirin 81 MG tablet     hydrALAZINE 25 MG tablet  Commonly known as:  APRESOLINE     metoprolol succinate 50 MG 24 hr tablet  Commonly known as:  TOPROL-XL      TAKE these medications        CALCIUM 600 + D PO  Take 2 tablets by mouth daily.     ezetimibe 10 MG tablet  Commonly known as:  ZETIA  Take 1 tablet (10 mg total) by mouth daily.     metoprolol 50 MG tablet  Commonly known as:  LOPRESSOR  Take 1 tablet (50 mg total) by mouth 2 (two) times daily.     MULTIVITAMIN PO  Take 1 tablet by mouth daily.     ranitidine 300 MG tablet  Commonly known as:  ZANTAC  Take 1 tablet (300 mg total) by mouth every morning.     sotalol 80 MG tablet  Commonly known as:  BETAPACE  Take 1 tablet (80 mg total) by mouth every 12 (twelve) hours.  Duration of Discharge Encounter   Greater than 30 minutes including physician time.  Hilbert Corrigan PA-C Pager: [phone] 04/22/2015, 6:02 PM

## 2015-04-22 NOTE — Progress Notes (Signed)
Patient Name: [name] Date of Encounter: 04/22/2015  Primary Cardiologist: Dr. Gwenlyn Found   Active Problems:   PAF (paroxysmal atrial fibrillation)   Atrial fibrillation with rapid ventricular response   SUBJECTIVE  Denies any CP or SOB. Feels good since cardioversion on Friday.  CURRENT MEDS . apixaban  5 mg Oral BID  . ezetimibe  10 mg Oral Daily  . metoprolol tartrate  50 mg Oral BID  . sodium chloride  3 mL Intravenous Q12H  . sotalol  80 mg Oral Q12H   OBJECTIVE  Filed Vitals:   04/21/15 1301 04/21/15 2015 04/21/15 2118 04/22/15 0450  BP: 111/60 124/59 116/68 125/68  Pulse: 63 60 63 59  Temp: 98.3 F (36.8 C) 97.7 F (36.5 C)  98.2 F (36.8 C)  TempSrc: Oral Oral  Oral  Resp: 17   18  Height:      Weight:    194 lb 9.6 oz (88.27 kg)  SpO2: 98% 99% 98% 97%    Intake/Output Summary (Last 24 hours) at 04/22/15 1015 Last data filed at 04/22/15 0749  Gross per 24 hour  Intake    220 ml  Output      0 ml  Net    220 ml   Filed Weights   04/20/15 0500 04/20/15 1254 04/22/15 0450  Weight: 190 lb 9.6 oz (86.456 kg) 190 lb (86.183 kg) 194 lb 9.6 oz (88.27 kg)   PHYSICAL EXAM  General: Pleasant, NAD. Neuro: Alert and oriented X 3. Moves all extremities spontaneously. Psych: Normal affect. HEENT:  Normal  Neck: Supple without bruits or JVD. Lungs:  Resp regular and unlabored. CTA with bibasilar rale Heart: RRR. no s3, s4, or murmurs. Abdomen: Soft, non-tender, non-distended, BS + x 4.  Extremities: No clubbing, cyanosis or edema. DP/PT/Radials 2+ and equal bilaterally.  Accessory Clinical Findings  CBC No results for input(s): WBC, NEUTROABS, HGB, HCT, MCV, PLT in the last 72 hours. Basic Metabolic Panel  Recent Labs  04/20/15 1205  NA 140  K 4.6  CL 110  CO2 22  GLUCOSE 98  BUN 16  CREATININE 0.92  CALCIUM 9.5    TELE NSR with HR 70s    ECG  No new EKG  Echocardiogram 04/18/2015  LV EF: 55% -   60%  ------------------------------------------------------------------- Indications:   Atrial fibrillation - 427.31.  ------------------------------------------------------------------- History:  PMH:  Atrial fibrillation. Risk factors: Hypertension.  ------------------------------------------------------------------- Study Conclusions  - Left ventricle: The cavity size was normal. Wall thickness was increased in a pattern of mild LVH. Systolic function was normal. The estimated ejection fraction was in the range of 55% to 60%. - Mitral valve: There was mild regurgitation. - Atrial septum: A patent foramen ovale cannot be excluded.    Radiology/Studies  Dg Chest Portable 1 View  04/17/2015   CLINICAL DATA:  Atrial fibrillation, shortness of breath, fatigue. Syncope. Symptoms for 1-2 days.  EXAM: PORTABLE CHEST - 1 VIEW  COMPARISON:  03/09/2015  FINDINGS: The heart size and mediastinal contours are within normal limits. Both lungs are clear. The visualized skeletal structures are unremarkable.  IMPRESSION: No active disease.   Electronically Signed   By: Rolm Baptise M.D.   On: 04/17/2015 11:22    ASSESSMENT AND PLAN  81 yo with PMH of CAD, PVD, h/o PAF, HTN, HLD who presented on 04/17/2015 with palpitation, SOB, lightheadedness, weakness and presyncope. Noted to be in afib with RVR with HR 140s on arrival  1. PAF with RVR  -  CHADSvasc 5, eliquis started on arrival  - could not tolerate diltiazem due to rash, verapmil cause tingling in extremities and blurred vision. Could not tolerate high dose due to extreme fatigue previously  - started on low dose digoxin, which has since been discontinued on 8/18 after starting sotalol along with metoprolol   - s/p TEE DCCV on 04/20/2015. Metoprolol dose reduced to 50mg  BID.   - Echo shows normal EF, normal LA size. TSH normal  - Transient borderline hypotension with SBP in 90s yesterday, however recovered since. Metoprolol decreased  down to 50mg  BID along with Sotalol 80mg  q12HR. Scheduled to receive the 6th dose of sotalol tonight. Will check EKG to monitor QTc level. Likely discharge tomorrow if QTc stable.    - does have bibasilar rale on exam today, ?atelesis vs fluid from a-fib RVR, however pt denies any SOB, will monitor for now.   2. CAD  - myoview 03/09/2015 low risk study with very small area of mild apicoseptal ischemia, cath 03/13/2015 minimal CAD, EF 55-60%  3. PVD 4. HTN 5. HLD  Remain in SR on metoprolol and sotalol, feels well, started to walk with some fatigue, but overall ok. I reviewed ECG, there is no QT prolongation. She has just received her last dose of sotalol, we will recheck ECG in 1 hour, if ok, discharge home.  Her pharmacy is closed on Sunday, she will need Eliquis and Sotalol supply till tomorrow.    Dorothy Spark 04/22/2015

## 2015-04-23 ENCOUNTER — Encounter (HOSPITAL_COMMUNITY): Payer: Self-pay | Admitting: Cardiology

## 2015-04-23 ENCOUNTER — Telehealth: Payer: Self-pay | Admitting: Cardiovascular Disease

## 2015-04-23 NOTE — Telephone Encounter (Signed)
LMTCB

## 2015-04-23 NOTE — Telephone Encounter (Signed)
D/C phone call.Marland Kitchen Appt is on 09/201/16 @ 1:30 w/ Ellen Henri   Thanks

## 2015-04-23 NOTE — Telephone Encounter (Signed)
Patient contacted regarding discharge from [facility] on 04/22/2015.  Patient understands to follow up with provider Lyda Jester on {05/22/2015 at 1:30pm at [facility]. Patient understands discharge instructions? yes  Patient understands medications and regiment? yes  Patient understands to bring all medications to this visit? yes   Patient states she is doing well

## 2015-04-26 ENCOUNTER — Telehealth: Payer: Self-pay | Admitting: Cardiovascular Disease

## 2015-04-26 NOTE — Telephone Encounter (Signed)
Returned call to patient, gave recommendation of PA follow up - she is amenable to this - I sent staff msg to [facility] to help me schedule patient here or at [facility].

## 2015-04-26 NOTE — Telephone Encounter (Signed)
Re: hand and foot swelling  Pt discharged recently from hospital. Several med changes including being d/c'ed from [place] 300/25  She reports since yesterday afternoon, hand and foot swelling. She disregarded initially thinking it would go away overnight. Reports it resolved somewhat and then recurred this AM.  Feels "a little bit swollen all over". Reports mild dyspnea - "not bothersome", but does notice. She also reports BP up slightly. 154/81 w/ HR of 57 (notes HR normal for her).  Requests advice on what to do. Will defer to [employer].

## 2015-04-26 NOTE — Telephone Encounter (Signed)
[name] arranged appt to see [name] on Monday at 3pm.  Gave pt appt info - she verbalized understanding.  Instructed to call again, use provider line over weekend if probs - continue to monitor BPs at rest, take note of any changes w/ extremity swelling. Pt agreeable.

## 2015-04-26 NOTE — Telephone Encounter (Signed)
Pt called in stating that she was in the hospital from 8/16 to 8/21 because her heart was out of rhythm. While she was there she says that she was taken off of her fluid pill and was put on another medication to help control her heart rate. Since then she complains of swollen hands and feet along with an increase in systolic BP. Please advise  Thanks

## 2015-04-26 NOTE — Telephone Encounter (Signed)
She had no obstructive disease on cath, was in NSR at discharge and had a normal EF.  See if we can move up her post hospital follow up with one of the APPs.

## 2015-04-30 ENCOUNTER — Encounter: Payer: Self-pay | Admitting: Cardiology

## 2015-04-30 ENCOUNTER — Ambulatory Visit (INDEPENDENT_AMBULATORY_CARE_PROVIDER_SITE_OTHER): Payer: Medicare Other | Admitting: Cardiology

## 2015-04-30 VITALS — BP 180/90 | HR 62 | Ht 66.0 in | Wt 196.0 lb

## 2015-04-30 DIAGNOSIS — I70219 Atherosclerosis of native arteries of extremities with intermittent claudication, unspecified extremity: Secondary | ICD-10-CM

## 2015-04-30 DIAGNOSIS — I4891 Unspecified atrial fibrillation: Secondary | ICD-10-CM

## 2015-04-30 DIAGNOSIS — I10 Essential (primary) hypertension: Secondary | ICD-10-CM

## 2015-04-30 MED ORDER — POTASSIUM CHLORIDE ER 10 MEQ PO TBCR: EXTENDED_RELEASE_TABLET | ORAL | Status: DC

## 2015-04-30 MED ORDER — FUROSEMIDE 20 MG PO TABS: ORAL_TABLET | ORAL | Status: DC

## 2015-04-30 NOTE — Progress Notes (Signed)
04/30/2015 [name]   Aug 29, 1933  [id number]  Primary Physician [name], MD Primary Cardiologist: Dr. Claiborne Billings   Reason for Visit/CC: Post hospital follow-up for atrial fibrillation  HPI:  Patient is a 81 year old female, followed by Dr. Claiborne Billings, with a past medical history of CAD, PAF, peripheral vascular disease, hypertension and hyperlipidemia. 03/13/2015 she underwent a cardiac catheterization which showed an ejection fraction 55-60% but no significant coronary artery disease.   On 04/19/2015, she presented to [facility] with complaints palpitations, shortness of breath and near syncope. On arrival she was noted to be in atrial fibrillation with rapid ventricular response. Rate control was initially attempted with Metroprolol and digoxin, however she had very little improvement.  Subsequently, anti-rhythmic therapy with sotalol was initiated and she underwent successful TEE guided cardioversion on 04/20/2015.  She did well with sotalol without any signs of QT prolongation. She was also continued on 50 mg of Metroprolol twice a day as well as Eliquis for anticoagulation.  She now presents to clinic for post hospital follow-up. She is accompanied by her husband. Her EKG today demonstrates normal sinus rhythm. Rate is well controlled at 63 bpm. Her QTc is 444 ms. Despite currently being in sinus rhythm, the patient reports that on 2 separate occasions since discharge, she had recurrent symptoms of breakthrough atrial fibrillation with  palpitations. Initial episode occurred 2 days after discharge and lasted 4-5 minutes. Symptoms occurred at rest and resolved spontaneously. The second episode also occurred at rest but only lasted less than 30 seconds. Since then, she has had no further recurrence. She reports full medication compliance with her sotalol, Metroprolol and Eliquis.  In addition to her recurrent palpitations, she also notes increased dyspnea on exertion as well as 2  pillow orthopnea, fluid retention and lower extremity edema. She reports that she came across an old prescription of hydrochlorothiazide and took a dose last night which resulted in improvement in orthopnea and also resolution of her lower extremity edema.   Current Outpatient Prescriptions  Medication Sig Dispense Refill  . apixaban (ELIQUIS) 5 MG TABS tablet Take 1 tablet (5 mg total) by mouth 2 (two) times daily. 60 tablet 11  . Calcium Carbonate-Vitamin D (CALCIUM 600 + D PO) Take 2 tablets by mouth daily.    Marland Kitchen ezetimibe (ZETIA) 10 MG tablet Take 1 tablet (10 mg total) by mouth daily. 90 tablet 3  . metoprolol (LOPRESSOR) 50 MG tablet Take 1 tablet (50 mg total) by mouth 2 (two) times daily. 14 tablet 0  . Multiple Vitamin (MULTIVITAMIN PO) Take 1 tablet by mouth daily.      . ranitidine (ZANTAC) 300 MG tablet Take 1 tablet (300 mg total) by mouth every morning. 90 tablet 3  . sotalol (BETAPACE) 80 MG tablet Take 1 tablet (80 mg total) by mouth every 12 (twelve) hours. 60 tablet 5   No current facility-administered medications for this visit.    Allergies  Allergen Reactions  . Acetaminophen-Codeine   . Amlodipine Besylate     REACTION: swelling  . Diltiazem Hcl     REACTION: rash  . Hydrocodone-Acetaminophen     nausea  . Irbesartan-Hydrochlorothiazide     REACTION: Dizziness  . Lisinopril     cough  . Omeprazole Nausea Only and Other (See Comments)    Dizziness, joint pain, weakness in legs, cramps in legs, stomach burned  . Propoxyphene N-Acetaminophen     Headache, and blotchy face  . Valsartan     REACTION: blurred  vision  . Warfarin Sodium     REACTION: body aches and bleeding  . Verapamil Nausea Only, Palpitations and Other (See Comments)    Headaches     Social History   Social History  . Marital Status: Widowed    Spouse Name: N/A  . Number of Children: 4  . Years of Education: N/A   Occupational History  . retired    Social History Main Topics  .  Smoking status: Never Smoker   . Smokeless tobacco: Never Used  . Alcohol Use: No  . Drug Use: No  . Sexual Activity: Not Currently   Other Topics Concern  . Not on file   Social History Narrative     Review of Systems: General: negative for chills, fever, night sweats or weight changes.  Cardiovascular: negative for chest pain, dyspnea on exertion, edema, orthopnea, palpitations, paroxysmal nocturnal dyspnea or shortness of breath Dermatological: negative for rash Respiratory: negative for cough or wheezing Urologic: negative for hematuria Abdominal: negative for nausea, vomiting, diarrhea, bright red blood per rectum, melena, or hematemesis Neurologic: negative for visual changes, syncope, or dizziness All other systems reviewed and are otherwise negative except as noted above.    Blood pressure 180/90, pulse 62, height 5\' 6"  (1.676 m), weight 196 lb (88.905 kg).  General appearance: alert, cooperative and no distress Neck: no carotid bruit and no JVD Lungs: clear to auscultation bilaterally Heart: regular rate and rhythm, S1, S2 normal, no murmur, click, rub or gallop Extremities: no LEE Pulses: 2+ and symmetric Skin: warm and dry Neurologic: Grossly normal  EKG NSR 62 bpm. QTc 444 ms  ASSESSMENT AND PLAN:   1. Paroxysmal atrial fibrillation: Patient is status post initiation of sotalol plus TEE guided direct current cardioversion. EKG today demonstrates normal sinus rhythm however the patient reports recent symptoms concerning for breakthrough atrial fibrillation. She has had 2 separate episodes each lasting less than 5 minutes in duration. This is despite full medication compliance with sotalol and the patient is concerned this therapy is not working for her. However, we are not completely sure whether or not she did in fact have recurrent atrial fibrillation versus possible PVCs or PACs. To further evaluate her response to sotalol therapy, I have recommended evaluation  with a two-week heart monitor to reassess for recurrent atrial fibrillation. If ambulatory monitoring reveals that she is having frequent breakthrough atrial fibrillation, will need to consider other antiarrhythmics therapy options. If this is the case, we'll refer her to our A. Fib clinic for further evaluation. For now, I have advised that she continue sotalol, Metroprolol and Eliquis.  2. Fluid Retention + Dyspnea: Recent assessment of left ventricular systolic function by left heart catheterization as well as 2-D echo revealed normal left ventricular systolic function. However patient notes increased fluid retention, lower extremity edema and two-pillow orthopnea which was relieved last night with use of hydrochlorothiazide, which was once prescribed to her in the past. On exam today she is currently euvolemic and lungs are clear to auscultation bilaterally. However, the patient is very concerned that this will happen to her again and is very afraid of having recurrent orthopnea. Review of recent laboratory work reveals normal renal function. I have prescribed low dose Lasix only to be used when necessary. Given the fact that she is on sotalol therapy, will need to avoid hypokalemia. I have also prescribed supplemental potassium only to be taken on days that she takes Lasix. She is instructed only to take when necessary Lasix and  supplemental potassium on days with her weight exceeds 5 pounds above her dry weight.    Lyda Jester PA-C 04/30/2015 3:38 PM

## 2015-04-30 NOTE — Patient Instructions (Signed)
Your physician has recommended you make the following change in your medication: prescriptions for furosemide and potassium has been sent to your pharmacy.  Your physician has recommended that you wear an event monitor. Event monitors are medical devices that record the heart's electrical activity. Doctors most often [place] these monitors to diagnose arrhythmias. Arrhythmias are problems with the speed or rhythm of the heartbeat. The monitor is a small, portable device. You can wear one while you do your normal daily activities. This is usually used to diagnose what is causing palpitations/syncope (passing out). This will be placed on at our [address] office and will be worn for 2 weeks.  Your physician recommends that you schedule a follow-up appointment in: November with Dr. Claiborne Billings.

## 2015-05-10 ENCOUNTER — Ambulatory Visit: Payer: Medicare Other

## 2015-05-10 DIAGNOSIS — I4891 Unspecified atrial fibrillation: Secondary | ICD-10-CM | POA: Diagnosis not present

## 2015-05-22 ENCOUNTER — Ambulatory Visit (INDEPENDENT_AMBULATORY_CARE_PROVIDER_SITE_OTHER): Payer: Medicare Other | Admitting: Cardiovascular Disease

## 2015-05-22 ENCOUNTER — Ambulatory Visit: Payer: Medicare Other | Admitting: Cardiology

## 2015-05-22 VITALS — BP 158/84 | HR 60 | Ht 66.0 in | Wt 197.6 lb

## 2015-05-22 DIAGNOSIS — I70219 Atherosclerosis of native arteries of extremities with intermittent claudication, unspecified extremity: Secondary | ICD-10-CM

## 2015-05-22 DIAGNOSIS — E669 Obesity, unspecified: Secondary | ICD-10-CM | POA: Diagnosis not present

## 2015-05-22 DIAGNOSIS — Z7901 Long term (current) use of anticoagulants: Secondary | ICD-10-CM

## 2015-05-22 DIAGNOSIS — I4891 Unspecified atrial fibrillation: Secondary | ICD-10-CM

## 2015-05-22 DIAGNOSIS — I480 Paroxysmal atrial fibrillation: Secondary | ICD-10-CM

## 2015-05-22 DIAGNOSIS — I10 Essential (primary) hypertension: Secondary | ICD-10-CM

## 2015-05-22 DIAGNOSIS — E66811 Obesity, class 1: Secondary | ICD-10-CM

## 2015-05-22 MED ORDER — FUROSEMIDE 20 MG PO TABS: ORAL_TABLET | ORAL | Status: DC

## 2015-05-22 NOTE — Patient Instructions (Addendum)
Your physician recommends that you schedule a follow-up appointment in: 4 weeks with Dr. Claiborne Billings  Your physician has recommended you make the following change in your medication: he recommends that you increase the furosemide to every 3rd day.

## 2015-05-24 ENCOUNTER — Encounter: Payer: Self-pay | Admitting: Cardiovascular Disease

## 2015-05-24 DIAGNOSIS — Z7901 Long term (current) use of anticoagulants: Secondary | ICD-10-CM | POA: Insufficient documentation

## 2015-05-24 NOTE — Progress Notes (Signed)
Patient ID: [name], female   DOB: 1932/10/26, 81 y.o.   MRN: [id number]    HPI: [name] is a 81 y.o. female who presents for a 7 month cardiology evaluation.  [name] has a history of paroxysmal atrial fibrillation, hypertension, as well as hyperlipidemia. Remotely, [name] developed myalgias with simvastatin and had not been willing to try additional lipid lowering therapy. In August 2012 an echo Doppler study showed mild asymmetric LVH with proximal septal thickening without LVOT obstruction. [name] had grade 1 diastolic dysfunction with normal systolic function, mild MR, mild TR, and aortic valve sclerosis with mild MR.   In December 2014 follow-up blood work showed normal renal function with a BUN of 19, creatinine 0.8.  [name] continued to have significant hyperlipidemia with a total cholesterol of 250, triglycerides 221 HDL 53, VLDL 44 and LDL cholesterol 153.  TSH was normal at 2.275.   When I last saw [name] [name] had noticed development of claudication, particularly involving her left leg with activity.  [name] also admits to expressing episodes of shortness of breath and fatigue.  [name] recently underwent a lower extremity arterial Doppler study which was abnormal and showed an ABI of 0.63 on the left and 1.0 on the right.  The left common iliac, external iliac, common femoral artery and profunda demonstrated multiphasic flow.  However, the superficial femoral artery on the left demonstrated occlusive disease in the proximal to mid thigh with reconstitution of flow in the mid distal segment.  The popliteal artery demonstrated patent monophasic flow with three-vessel runoff.  The anterior tibial artery demonstrated focal stenosis in the proximal calf with dampened flow distal to this.  The patient has multiple allergies and in the past did not tolerate Ace inhibition or ARB therapy has been able to tolerate direct renin inhibition.  [name] has a history of GERD and has been taking ranitidine  and sucralfate.  [name] denies chest tightness but does admit to shortness of breath with activity as well as increasing fatigue.  [name] denies presyncope or syncope.  [name] denies PND or orthopnea.  [name] has not had recent blood work obtained.    Since I last saw her, [name] presented to [facility] hospital in August with complaints of palpitations, shortness of breath and presyncope.  [name] was noted to be in atrial fibrillation with rapid ventricular response.  Her heart rate was initially attempted to be controlled with metoprolol and digitoxin, which was not successful and ultimately sotalol was started.  On 04/20/2015.  [name] underwent successful TEE guided cardioversion.  [name] was continued on metoprolol as well as eloquent for anticoagulation.  [name] saw Ellen Henri in the office on August 29.  An echo Doppler study on 04/18/2015 showed an EF of 55-60% with mild LVH.  Since her office visit with [name] [name] is had experienced 3 short episodes of increased heart rate, which lasts seconds.  [name] admits to being tired.  [name] admits to shortness of breath.  [name] denies chest pressure.  [name] has been wearing a heart monitor for the past 2 weeks.  [name] presents for evaluation  Past Medical History  Diagnosis Date  . Arthritis   . Hypertension   . Iron deficiency anemia, unspecified   . Osteoporosis   . Colon cancer   . Unspecified vascular insufficiency of intestine   . HLD (hyperlipidemia)   . Coronary atherosclerosis of unspecified type of vessel, native or graft   . Atrial fibrillation   . Ischemic colitis   .  Fatty liver 11/05/11  . Hiatal hernia   . Esophageal stricture   . Claudication   . Abnormal nuclear stress test     mild anterolateral septal and inferior ischemia  . PAF (paroxysmal atrial fibrillation) 04/2015  . Atherosclerosis of lower extremity with claudication 04/17/2015    LEFT LEG    Past Surgical History  Procedure Laterality Date  . Tubal ligation    . Appendectomy    . Sigmoid  resection for invasive rectal adenocarcinoma  12/2006  . Abdominoperineal resection anastomotic stricture  05/2007  . Dilation and curettage of uterus  1992  . Rotator cuff repair  2006    left  . Mandibular renstruction    . Rt. salpingo oophorectomy and cyst removal  2005  . Wrist surgery  2008    right  . Esophagogastroduodenoscopy N/A 04/27/2014    Procedure: ESOPHAGOGASTRODUODENOSCOPY (EGD);  Surgeon: Lafayette Dragon, MD;  Location: [facility];  Service: Endoscopy;  Laterality: N/A;  . Savory dilation N/A 04/27/2014    Procedure: SAVORY DILATION;  Surgeon: Lafayette Dragon, MD;  Location: [facility];  Service: Endoscopy;  Laterality: N/A;  no xray needed  . Cardiac catheterization N/A 03/13/2015    Procedure: Left Heart Cath and Coronary Angiography;  Surgeon: Troy Sine, MD;  Location: [facility];  Service: Cardiovascular;  Laterality: N/A;  . Tee without cardioversion N/A 04/20/2015    Procedure: TRANSESOPHAGEAL ECHOCARDIOGRAM (TEE);  Surgeon: Jerline Pain, MD;  Location: [facility];  Service: Cardiovascular;  Laterality: N/A;  . Cardioversion N/A 04/20/2015    Procedure: CARDIOVERSION;  Surgeon: Jerline Pain, MD;  Location: [facility];  Service: Cardiovascular;  Laterality: N/A;    Allergies  Allergen Reactions  . Acetaminophen-Codeine   . Amlodipine Besylate     REACTION: swelling  . Diltiazem Hcl     REACTION: rash  . Hydrocodone-Acetaminophen     nausea  . Irbesartan-Hydrochlorothiazide     REACTION: Dizziness  . Lisinopril     cough  . Omeprazole Nausea Only and Other (See Comments)    Dizziness, joint pain, weakness in legs, cramps in legs, stomach burned  . Propoxyphene N-Acetaminophen     Headache, and blotchy face  . Valsartan     REACTION: blurred vision  . Warfarin Sodium     REACTION: body aches and bleeding  . Verapamil Nausea Only, Palpitations and Other (See Comments)    Headaches     Current Outpatient Prescriptions  Medication Sig  Dispense Refill  . apixaban (ELIQUIS) 5 MG TABS tablet Take 1 tablet (5 mg total) by mouth 2 (two) times daily. 60 tablet 11  . Calcium Carbonate-Vitamin D (CALCIUM 600 + D PO) Take 2 tablets by mouth daily.    Marland Kitchen ezetimibe (ZETIA) 10 MG tablet Take 1 tablet (10 mg total) by mouth daily. 90 tablet 3  . furosemide (LASIX) 20 MG tablet Take 1 tablet every 3rd day 30 tablet 3  . metoprolol (LOPRESSOR) 50 MG tablet Take 1 tablet (50 mg total) by mouth 2 (two) times daily. 14 tablet 0  . Multiple Vitamin (MULTIVITAMIN PO) Take 1 tablet by mouth daily.      . potassium chloride (K-DUR) 10 MEQ tablet Take 1 tablet on the days that you take the furosemide. 30 tablet 3  . ranitidine (ZANTAC) 300 MG tablet Take 1 tablet (300 mg total) by mouth every morning. 90 tablet 3  . sotalol (BETAPACE) 80 MG tablet Take 1 tablet (80 mg total) by  mouth every 12 (twelve) hours. 60 tablet 5   No current facility-administered medications for this visit.    Social History   Social History  . Marital Status: Widowed    Spouse Name: N/A  . Number of Children: 4  . Years of Education: N/A   Occupational History  . retired    Social History Main Topics  . Smoking status: Never Smoker   . Smokeless tobacco: Never Used  . Alcohol Use: No  . Drug Use: No  . Sexual Activity: Not Currently   Other Topics Concern  . Not on file   Social History Narrative    Family History  Problem Relation Age of Onset  . Colon cancer Father 45  . Heart disease Father   . Hypertension Father   . Esophageal cancer Neg Hx   . Rectal cancer Neg Hx   . Stomach cancer Neg Hx   . Alzheimer's disease Mother   . Thyroid disease Mother   . Hypertension Sister   . Thyroid disease Sister   . Mitral valve prolapse Sister   . Hypertension Son   . Diabetes Son   . Hypertension Son   . Diabetes Son    Social history  is notable that [name] is widowed. [name] has 3 children and 9 grandchildren. [name] remains active. There is no alcohol  tobacco use.   ROS General: Negative; No fevers, chills, or night sweats; positive for fatigue and shortness of breath HEENT: Negative; No changes in vision or hearing, sinus congestion, difficulty swallowing Pulmonary: Negative; No cough, wheezing, hemoptysis Cardiovascular: Negative; No chest pain, presyncope, syncope, palpitations Positive for left leg claudication GI: Positive for GERD GU: Negative; No dysuria, hematuria, or difficulty voiding Musculoskeletal: Negative; no myalgias, joint pain, or weakness Hematologic/Oncology: Negative; no easy bruising, bleeding Endocrine: Negative; no heat/cold intolerance; no diabetes Neuro: Negative; no changes in balance, headaches Skin: Negative; No rashes or skin lesions Psychiatric: Negative; No behavioral problems, depression Sleep: Negative; No snoring, daytime sleepiness, hypersomnolence, bruxism, restless legs, hypnogognic hallucinations, no cataplexy Other comprehensive 14 point system review is negative.   PE BP 158/84 mmHg  Pulse 60  Ht 5' 6"  (1.676 m)  Wt 197 lb 9.6 oz (89.631 kg)  BMI 31.91 kg/m2  Repeat blood pressure by me was 168/88 in the right arm and 170/86 in the left arm.  Wt Readings from Last 3 Encounters:  05/22/15 197 lb 9.6 oz (89.631 kg)  04/30/15 196 lb (88.905 kg)  04/22/15 194 lb 9.6 oz (88.27 kg)   General: Alert, oriented, no distress.  Skin: normal turgor, no rashes HEENT: Normocephalic, atraumatic. Pupils round and reactive; sclera anicteric;no lid lag.  Nose without nasal septal hypertrophy Mouth/Parynx benign; Mallinpatti scale 2 Neck: No JVD, no carotid bruits with normal carotid upstroke Chest wall: Nontender to palpation Lungs: clear to ausculatation and percussion; no wheezing or rales Heart: RRR, s1 s2 normal 1/6 systolic murmur, with a faint aortic insufficiency murmur; .  No S3 gallop.  No diastolic murmur rubs thrills or heaves. Abdomen: soft, nontender; no hepatosplenomehaly, BS+;  abdominal aorta nontender and not dilated by palpation. Back: No CVA tenderness Pulses 2+ , except diminished in the left lower extremity Extremities: no clubbing cyanosis or edema, Homan's sign negative  Neurologic: grossly nonfocal Psychologic: normal affect and mood.  ECG (independently read by me): Normal sinus rhythm at 60 bpm.  QTc interval 454 ms.  No significant ST segment changes.  June 2016 ECG (independently read by me): Normal sinus rhythm  at 63 bpm.  Poor precordial R-wave progression.  No ectopy.  November 2015 ECG (independently read by me);  Normal sinus rhythm at 59 bpm.  QTc interval 415 ms.  No significant ST segment changes.  Prior November 2014ECG: Sinus rhythm at 63 beats per minute. Normal intervals.  LABS: BMP Latest Ref Rng 04/20/2015 04/18/2015 04/17/2015  Glucose 65 - 99 mg/dL 98 103(H) 105(H)  BUN 6 - 20 mg/dL 16 21(H) 22(H)  Creatinine 0.44 - 1.00 mg/dL 0.92 1.07(H) 1.24(H)  Sodium 135 - 145 mmol/L 140 141 142  Potassium 3.5 - 5.1 mmol/L 4.6 3.7 3.8  Chloride 101 - 111 mmol/L 110 107 104  CO2 22 - 32 mmol/L 22 25 25   Calcium 8.9 - 10.3 mg/dL 9.5 9.3 9.6   Hepatic Function Latest Ref Rng 04/17/2015 07/24/2014 08/08/2013  Total Protein 6.5 - 8.1 g/dL 6.4(L) 7.2 6.9  Albumin 3.5 - 5.0 g/dL 3.7 4.4 4.4  AST 15 - 41 U/L 31 21 21   ALT 14 - 54 U/L 22 20 22   Alk Phosphatase 38 - 126 U/L 47 58 67  Total Bilirubin 0.3 - 1.2 mg/dL 1.0 1.0 0.8  Bilirubin, Direct 0.0 - 0.3 mg/dL - - -   CBC Latest Ref Rng 04/18/2015 04/17/2015 03/09/2015  WBC 4.0 - 10.5 K/uL 6.4 7.3 6.1  Hemoglobin 12.0 - 15.0 g/dL 15.7(H) 16.3(H) 15.2(H)  Hematocrit 36.0 - 46.0 % 46.3(H) 48.0(H) 43.9  Platelets 150 - 400 K/uL 179 205 192   Lab Results  Component Value Date   MCV 91.1 04/18/2015   MCV 91.6 04/17/2015   MCV 89.8 03/09/2015   Lab Results  Component Value Date   TSH 2.909 04/18/2015   Lab Results  Component Value Date   HGBA1C  05/15/2007    5.8 (NOTE)   The ADA recommends  the following therapeutic goals for glycemic   control related to Hgb A1C measurement:   Goal of Therapy:   < 7.0% Hgb A1C   Action Suggested:  > 8.0% Hgb A1C   Ref:  Diabetes Care, 22, Suppl. 1, 1999   Lipid Panel     Component Value Date/Time   CHOL 216* 07/24/2014 1027   TRIG 210* 07/24/2014 1027   HDL 53 07/24/2014 1027   CHOLHDL 4.1 07/24/2014 1027   VLDL 42* 07/24/2014 1027   LDLCALC 121* 07/24/2014 1027   LDLDIRECT 152.3 02/10/2012 1010    RADIOLOGY: US Breast Right  07/04/2013   CLINICAL DATA:  Abnormal screening right mammogram.  EXAM: DIGITAL DIAGNOSTIC  right MAMMOGRAM  ULTRASOUND right BREAST  COMPARISON:  With prior exams.  ACR Breast Density Category b: There are scattered areas of fibroglandular density.  FINDINGS: Spot compression views of the lateral aspect of the right breast were performed. There is persistence of a 4 mm low-density nodule in the posterior 3rd of the breast. There are no malignant type microcalcifications.  On physical exam I do not palpate a mass in the right breast.  Ultrasound is performed, showing there is a near anechoic lesion in the right breast at 7 o'clock 8 cm from the nipple measuring 3 x 2 x 3 mm.  IMPRESSION: Probable benign lesion in the right breast.  RECOMMENDATION: Short-term interval followup right mammogram and ultrasound in 6 months is recommended to document stability.  I have discussed the findings and recommendations with the patient. Results were also provided in writing at the conclusion of the visit. If applicable, a reminder letter will be sent to the patient regarding  the next appointment.  BI-RADS CATEGORY  3: Probably benign finding(s) - short interval follow-up suggested.   Electronically Signed   By: Lillia Mountain M.D.   On: 07/04/2013 09:01   Mm Weott R  07/04/2013   CLINICAL DATA:  Abnormal screening right mammogram.  EXAM: DIGITAL DIAGNOSTIC  right MAMMOGRAM  ULTRASOUND right BREAST  COMPARISON:  With prior exams.  ACR  Breast Density Category b: There are scattered areas of fibroglandular density.  FINDINGS: Spot compression views of the lateral aspect of the right breast were performed. There is persistence of a 4 mm low-density nodule in the posterior 3rd of the breast. There are no malignant type microcalcifications.  On physical exam I do not palpate a mass in the right breast.  Ultrasound is performed, showing there is a near anechoic lesion in the right breast at 7 o'clock 8 cm from the nipple measuring 3 x 2 x 3 mm.  IMPRESSION: Probable benign lesion in the right breast.  RECOMMENDATION: Short-term interval followup right mammogram and ultrasound in 6 months is recommended to document stability.  I have discussed the findings and recommendations with the patient. Results were also provided in writing at the conclusion of the visit. If applicable, a reminder letter will be sent to the patient regarding the next appointment.  BI-RADS CATEGORY  3: Probably benign finding(s) - short interval follow-up suggested.   Electronically Signed   By: Lillia Mountain M.D.   On: 07/04/2013 09:01       ASSESSMENT AND PLAN: Ms. Cremeens is an 81 year old female who has a history of hypertension,  paroxysmal atrial fibrillation,  Hyperlipidemia and peripheral vascular disease.  In light of her recent episode of atrial fibrillation.  [name] is now on eloquence anticoagulation and is no longer on aspirin and Plavix.  [name] is maintaining sinus rhythm on sotalol 80 mg every 12 hours.  In addition to metoprolol tartrate 50 mg twice a day.  [name] does note some intermittent leg swelling and I have suggested [name] take Lasix at least every third day.  Also will be helpful for her blood pressure which is elevated today and leg swelling.  [name] was to turn in her monitor this week.  With her recent short-lived episodes of palpitations.  I am recommending that we extend this for 4 week monitoring..  If [name] does have recurrent atrial fibrillation, further  titration of sotalol may be necessary in her metoprolol dose will need to be reduced.  I reviewed her hospitalization as well as her echo Doppler data.  I will see her in 4 weeks for reevaluation.  Time spent: 25 minutes  Troy Sine, MD, [facility]  05/24/2015 7:24 PM

## 2015-05-31 ENCOUNTER — Telehealth: Payer: Self-pay | Admitting: *Deleted

## 2015-05-31 NOTE — Telephone Encounter (Signed)
Patient instructed cardiac event monitor was to be extended an additional 2 weeks.  She had previously been told to wear it for 2 weeks.  Her enrollment with [employer] is for a cardiac event monitor up to 30 days.  She has been instructed to wear her monitor up to 06/09/2015.

## 2015-05-31 NOTE — Telephone Encounter (Deleted)
Patient left message [employer] will extend her service 2 additional weeks.  She should  expect a second cardiac event monitor and supplies to be shipped to her home.  Once she has the new monitor, she should apply it and send [employer] an event.  Contact [employer] directly if she needs assistance.

## 2015-06-18 DIAGNOSIS — M5135 Other intervertebral disc degeneration, thoracolumbar region: Secondary | ICD-10-CM | POA: Diagnosis not present

## 2015-06-18 DIAGNOSIS — M9902 Segmental and somatic dysfunction of thoracic region: Secondary | ICD-10-CM | POA: Diagnosis not present

## 2015-06-18 DIAGNOSIS — M5137 Other intervertebral disc degeneration, lumbosacral region: Secondary | ICD-10-CM | POA: Diagnosis not present

## 2015-06-18 DIAGNOSIS — M9903 Segmental and somatic dysfunction of lumbar region: Secondary | ICD-10-CM | POA: Diagnosis not present

## 2015-06-18 DIAGNOSIS — M461 Sacroiliitis, not elsewhere classified: Secondary | ICD-10-CM | POA: Diagnosis not present

## 2015-06-18 DIAGNOSIS — M9904 Segmental and somatic dysfunction of sacral region: Secondary | ICD-10-CM | POA: Diagnosis not present

## 2015-06-19 DIAGNOSIS — M5135 Other intervertebral disc degeneration, thoracolumbar region: Secondary | ICD-10-CM | POA: Diagnosis not present

## 2015-06-19 DIAGNOSIS — M9904 Segmental and somatic dysfunction of sacral region: Secondary | ICD-10-CM | POA: Diagnosis not present

## 2015-06-19 DIAGNOSIS — M5137 Other intervertebral disc degeneration, lumbosacral region: Secondary | ICD-10-CM | POA: Diagnosis not present

## 2015-06-19 DIAGNOSIS — M9902 Segmental and somatic dysfunction of thoracic region: Secondary | ICD-10-CM | POA: Diagnosis not present

## 2015-06-19 DIAGNOSIS — M9903 Segmental and somatic dysfunction of lumbar region: Secondary | ICD-10-CM | POA: Diagnosis not present

## 2015-06-19 DIAGNOSIS — M461 Sacroiliitis, not elsewhere classified: Secondary | ICD-10-CM | POA: Diagnosis not present

## 2015-06-20 DIAGNOSIS — M9904 Segmental and somatic dysfunction of sacral region: Secondary | ICD-10-CM | POA: Diagnosis not present

## 2015-06-20 DIAGNOSIS — M9903 Segmental and somatic dysfunction of lumbar region: Secondary | ICD-10-CM | POA: Diagnosis not present

## 2015-06-20 DIAGNOSIS — M461 Sacroiliitis, not elsewhere classified: Secondary | ICD-10-CM | POA: Diagnosis not present

## 2015-06-20 DIAGNOSIS — M5137 Other intervertebral disc degeneration, lumbosacral region: Secondary | ICD-10-CM | POA: Diagnosis not present

## 2015-06-20 DIAGNOSIS — M5135 Other intervertebral disc degeneration, thoracolumbar region: Secondary | ICD-10-CM | POA: Diagnosis not present

## 2015-06-20 DIAGNOSIS — M9902 Segmental and somatic dysfunction of thoracic region: Secondary | ICD-10-CM | POA: Diagnosis not present

## 2015-06-21 ENCOUNTER — Other Ambulatory Visit: Payer: Self-pay

## 2015-06-21 ENCOUNTER — Telehealth: Payer: Self-pay | Admitting: Cardiovascular Disease

## 2015-06-21 DIAGNOSIS — Z1231 Encounter for screening mammogram for malignant neoplasm of breast: Secondary | ICD-10-CM

## 2015-06-21 NOTE — Telephone Encounter (Signed)
Patient calling the office for samples of medication:   1.  What medication and dosage are you requesting samples for? Zetia and Eliquis   2.  Are you currently out of this medication? Yes she is in the donut hole   3. Are you requesting samples to get you through until you receive your prescription? No

## 2015-06-21 NOTE — Telephone Encounter (Signed)
Pt made aware that we no longer have sample of Zetia but that I am leaving a months of sample for Eliquis 5mg  at teh front desk Verbalized understanding no questions at this time.

## 2015-06-25 ENCOUNTER — Telehealth: Payer: Self-pay | Admitting: Cardiovascular Disease

## 2015-06-25 NOTE — Telephone Encounter (Signed)
Patient didn't need ... Close encounter

## 2015-06-26 DIAGNOSIS — M5135 Other intervertebral disc degeneration, thoracolumbar region: Secondary | ICD-10-CM | POA: Diagnosis not present

## 2015-06-26 DIAGNOSIS — M9902 Segmental and somatic dysfunction of thoracic region: Secondary | ICD-10-CM | POA: Diagnosis not present

## 2015-06-26 DIAGNOSIS — M461 Sacroiliitis, not elsewhere classified: Secondary | ICD-10-CM | POA: Diagnosis not present

## 2015-06-26 DIAGNOSIS — M9903 Segmental and somatic dysfunction of lumbar region: Secondary | ICD-10-CM | POA: Diagnosis not present

## 2015-06-26 DIAGNOSIS — M5137 Other intervertebral disc degeneration, lumbosacral region: Secondary | ICD-10-CM | POA: Diagnosis not present

## 2015-06-26 DIAGNOSIS — M9904 Segmental and somatic dysfunction of sacral region: Secondary | ICD-10-CM | POA: Diagnosis not present

## 2015-07-02 DIAGNOSIS — M9902 Segmental and somatic dysfunction of thoracic region: Secondary | ICD-10-CM | POA: Diagnosis not present

## 2015-07-02 DIAGNOSIS — M9904 Segmental and somatic dysfunction of sacral region: Secondary | ICD-10-CM | POA: Diagnosis not present

## 2015-07-02 DIAGNOSIS — M5137 Other intervertebral disc degeneration, lumbosacral region: Secondary | ICD-10-CM | POA: Diagnosis not present

## 2015-07-02 DIAGNOSIS — M5135 Other intervertebral disc degeneration, thoracolumbar region: Secondary | ICD-10-CM | POA: Diagnosis not present

## 2015-07-02 DIAGNOSIS — M9903 Segmental and somatic dysfunction of lumbar region: Secondary | ICD-10-CM | POA: Diagnosis not present

## 2015-07-02 DIAGNOSIS — M461 Sacroiliitis, not elsewhere classified: Secondary | ICD-10-CM | POA: Diagnosis not present

## 2015-07-04 DIAGNOSIS — M9902 Segmental and somatic dysfunction of thoracic region: Secondary | ICD-10-CM | POA: Diagnosis not present

## 2015-07-04 DIAGNOSIS — M9904 Segmental and somatic dysfunction of sacral region: Secondary | ICD-10-CM | POA: Diagnosis not present

## 2015-07-04 DIAGNOSIS — M461 Sacroiliitis, not elsewhere classified: Secondary | ICD-10-CM | POA: Diagnosis not present

## 2015-07-04 DIAGNOSIS — M5137 Other intervertebral disc degeneration, lumbosacral region: Secondary | ICD-10-CM | POA: Diagnosis not present

## 2015-07-04 DIAGNOSIS — M5135 Other intervertebral disc degeneration, thoracolumbar region: Secondary | ICD-10-CM | POA: Diagnosis not present

## 2015-07-04 DIAGNOSIS — M9903 Segmental and somatic dysfunction of lumbar region: Secondary | ICD-10-CM | POA: Diagnosis not present

## 2015-07-10 DIAGNOSIS — M9904 Segmental and somatic dysfunction of sacral region: Secondary | ICD-10-CM | POA: Diagnosis not present

## 2015-07-10 DIAGNOSIS — M461 Sacroiliitis, not elsewhere classified: Secondary | ICD-10-CM | POA: Diagnosis not present

## 2015-07-10 DIAGNOSIS — M9902 Segmental and somatic dysfunction of thoracic region: Secondary | ICD-10-CM | POA: Diagnosis not present

## 2015-07-10 DIAGNOSIS — M9903 Segmental and somatic dysfunction of lumbar region: Secondary | ICD-10-CM | POA: Diagnosis not present

## 2015-07-10 DIAGNOSIS — M5135 Other intervertebral disc degeneration, thoracolumbar region: Secondary | ICD-10-CM | POA: Diagnosis not present

## 2015-07-10 DIAGNOSIS — M5137 Other intervertebral disc degeneration, lumbosacral region: Secondary | ICD-10-CM | POA: Diagnosis not present

## 2015-07-18 ENCOUNTER — Ambulatory Visit (INDEPENDENT_AMBULATORY_CARE_PROVIDER_SITE_OTHER): Payer: Medicare Other | Admitting: Cardiovascular Disease

## 2015-07-18 VITALS — BP 144/86 | HR 59 | Ht 66.0 in | Wt 195.8 lb

## 2015-07-18 DIAGNOSIS — Z7901 Long term (current) use of anticoagulants: Secondary | ICD-10-CM

## 2015-07-18 DIAGNOSIS — I25118 Atherosclerotic heart disease of native coronary artery with other forms of angina pectoris: Secondary | ICD-10-CM | POA: Diagnosis not present

## 2015-07-18 DIAGNOSIS — I10 Essential (primary) hypertension: Secondary | ICD-10-CM | POA: Diagnosis not present

## 2015-07-18 DIAGNOSIS — I70219 Atherosclerosis of native arteries of extremities with intermittent claudication, unspecified extremity: Secondary | ICD-10-CM | POA: Diagnosis not present

## 2015-07-18 NOTE — Patient Instructions (Signed)
Your physician wants you to follow-up in: 6 MONTHS OR SOONER IF NEEDED. You will receive a reminder letter in the mail two months in advance. If you don't receive a letter, please call our office to schedule the follow-up appointment.   If you need a refill on your cardiac medications before your next appointment, please call your pharmacy.   

## 2015-07-19 ENCOUNTER — Ambulatory Visit
Admission: RE | Admit: 2015-07-19 | Discharge: 2015-07-19 | Disposition: A | Discharge status: Home / Self Care | Payer: Medicare Other | Source: Ambulatory Visit

## 2015-07-19 DIAGNOSIS — Z1231 Encounter for screening mammogram for malignant neoplasm of breast: Secondary | ICD-10-CM | POA: Diagnosis not present

## 2015-07-20 ENCOUNTER — Encounter: Payer: Self-pay | Admitting: Cardiovascular Disease

## 2015-07-20 LAB — HM MAMMOGRAPHY: HM MAMMO: NORMAL

## 2015-07-20 NOTE — Progress Notes (Signed)
Patient ID: [name], female   DOB: 04-28-1933, 81 y.o.   MRN: [id number]    HPI: [name] is a 81 y.o. female who presents for a 3 month cardiology evaluation.  [name] has a history of paroxysmal atrial fibrillation, hypertension, as well as hyperlipidemia. Remotely, [name] developed myalgias with simvastatin and had not been willing to try additional lipid lowering therapy. In August 2012 an echo Doppler study showed mild asymmetric LVH with proximal septal thickening without LVOT obstruction. [name] had grade 1 diastolic dysfunction with normal systolic function, mild MR, mild TR, and aortic valve sclerosis with mild MR.   In December 2014 follow-up blood work showed normal renal function with a BUN of 19, creatinine 0.8.  [name] continued to have significant hyperlipidemia with a total cholesterol of 250, triglycerides 221 HDL 53, VLDL 44 and LDL cholesterol 153.  TSH was normal at 2.275.   When I last saw [name] [name] had noticed development of claudication, particularly involving her left leg with activity.  [name] also admits to expressing episodes of shortness of breath and fatigue.  [name] recently underwent a lower extremity arterial Doppler study which was abnormal and showed an ABI of 0.63 on the left and 1.0 on the right.  The left common iliac, external iliac, common femoral artery and profunda demonstrated multiphasic flow.  However, the superficial femoral artery on the left demonstrated occlusive disease in the proximal to mid thigh with reconstitution of flow in the mid distal segment.  The popliteal artery demonstrated patent monophasic flow with three-vessel runoff.  The anterior tibial artery demonstrated focal stenosis in the proximal calf with dampened flow distal to this.  The patient has multiple allergies and in the past did not tolerate ACE-inhibition or ARB therapy has been able to tolerate direct renin inhibition.  [name] has a history of GERD and has been taking ranitidine  and sucralfate.  [name] denies chest tightness but does admit to shortness of breath with activity as well as increasing fatigue.  [name] denies presyncope or syncope.  [name] denies PND or orthopnea.    [name] presented to [facility] hospital in August 2016 with complaints of palpitations, shortness of breath and presyncope.  [name] was noted to be in atrial fibrillation with rapid ventricular response.  Her heart rate was initially attempted to be controlled with metoprolol and digitoxin, which was not successful and ultimately sotalol was started.  On 04/20/2015 [name] underwent successful TEE guided cardioversion.  [name] was continued on metoprolol as well as eliquis for anticoagulation.  An echo Doppler study on 04/18/2015 showed an EF of 55-60% with mild LVH.  When I last saw her in follow-up of her APP office visit with [name] [name] had experienced 3 short episodes of increased heart rate, which lasts seconds.  [name] admits to being tired.  [name] admits to shortness of breath.  [name] denies chest pressure.  [name] had worn a heart monitor.  [name] is unaware of recurrent atrial fibrillation and her monitor did not detect any recurrent AF.  [name] denies chest Turner.  [name] denies bleeding.  [name] states her claudication is better in her left leg since [name]'s been started on anticoagulation therapy.  [name] admits to trace ankle swelling right greater than left.  [name] presents for follow-up evaluation.  Past Medical History  Diagnosis Date  . Arthritis   . Hypertension   . Iron deficiency anemia, unspecified   . Osteoporosis   . Colon cancer ([place])   . Unspecified vascular  insufficiency of intestine   . HLD (hyperlipidemia)   . Coronary atherosclerosis of unspecified type of vessel, native or graft   . Atrial fibrillation ([place])   . Ischemic colitis ([name])   . Fatty liver 11/05/11  . Hiatal hernia   . Esophageal stricture   . Claudication ([name])   . Abnormal nuclear stress test     mild anterolateral septal and inferior ischemia  . PAF  (paroxysmal atrial fibrillation) ([name]) 04/2015  . Atherosclerosis of lower extremity with claudication ([name]) 04/17/2015    LEFT LEG    Past Surgical History  Procedure Laterality Date  . Tubal ligation    . Appendectomy    . Sigmoid resection for invasive rectal adenocarcinoma  12/2006  . Abdominoperineal resection anastomotic stricture  05/2007  . Dilation and curettage of uterus  1992  . Rotator cuff repair  2006    left  . Mandibular renstruction    . Rt. salpingo oophorectomy and cyst removal  2005  . Wrist surgery  2008    right  . Esophagogastroduodenoscopy N/A 04/27/2014    Procedure: ESOPHAGOGASTRODUODENOSCOPY (EGD);  Surgeon: Holly Dragon, MD;  Location: [facility];  Service: Endoscopy;  Laterality: N/A;  . Savory dilation N/A 04/27/2014    Procedure: SAVORY DILATION;  Surgeon: Holly Dragon, MD;  Location: [facility];  Service: Endoscopy;  Laterality: N/A;  no xray needed  . Cardiac catheterization N/A 03/13/2015    Procedure: Left Heart Cath and Coronary Angiography;  Surgeon: Holly Sine, MD;  Location: [facility];  Service: Cardiovascular;  Laterality: N/A;  . Tee without cardioversion N/A 04/20/2015    Procedure: TRANSESOPHAGEAL ECHOCARDIOGRAM (TEE);  Surgeon: Holly Pain, MD;  Location: [place];  Service: Cardiovascular;  Laterality: N/A;  . Cardioversion N/A 04/20/2015    Procedure: CARDIOVERSION;  Surgeon: Holly Pain, MD;  Location: [facility] ENDOSCOPY;  Service: Cardiovascular;  Laterality: N/A;    Allergies  Allergen Reactions  . Acetaminophen-Codeine   . Amlodipine Besylate     REACTION: swelling  . Diltiazem Hcl     REACTION: rash  . Hydrocodone-Acetaminophen     nausea  . Irbesartan-Hydrochlorothiazide     REACTION: Dizziness  . Lisinopril     cough  . Omeprazole Nausea Only and Other (See Comments)    Dizziness, joint Turner, weakness in legs, cramps in legs, stomach burned  . Propoxyphene N-Acetaminophen     Headache, and blotchy face  .  Valsartan     REACTION: blurred vision  . Warfarin Sodium     REACTION: body aches and bleeding  . Verapamil Nausea Only, Palpitations and Other (See Comments)    Headaches     Current Outpatient Prescriptions  Medication Sig Dispense Refill  . apixaban (ELIQUIS) 5 MG TABS tablet Take 1 tablet (5 mg total) by mouth 2 (two) times daily. 60 tablet 11  . Calcium Carbonate-Vitamin D (CALCIUM 600 + D PO) Take 2 tablets by mouth daily.    Marland Kitchen ezetimibe (ZETIA) 10 MG tablet Take 1 tablet (10 mg total) by mouth daily. 90 tablet 3  . furosemide (LASIX) 20 MG tablet Take 1 tablet every 3rd day 30 tablet 3  . metoprolol (LOPRESSOR) 50 MG tablet Take 1 tablet (50 mg total) by mouth 2 (two) times daily. 14 tablet 0  . Multiple Vitamin (MULTIVITAMIN PO) Take 1 tablet by mouth daily.      . potassium chloride (K-DUR) 10 MEQ tablet Take 1 tablet on the days that you take  the furosemide. 30 tablet 3  . ranitidine (ZANTAC) 300 MG tablet Take 1 tablet (300 mg total) by mouth every morning. 90 tablet 3  . sotalol (BETAPACE) 80 MG tablet Take 1 tablet (80 mg total) by mouth every 12 (twelve) hours. 60 tablet 5   No current facility-administered medications for this visit.    Social History   Social History  . Marital Status: Widowed    Spouse Name: N/A  . Number of Children: 4  . Years of Education: N/A   Occupational History  . retired    Social History Main Topics  . Smoking status: Never Smoker   . Smokeless tobacco: Never Used  . Alcohol Use: No  . Drug Use: No  . Sexual Activity: Not Currently   Other Topics Concern  . Not on file   Social History Narrative    Family History  Problem Relation Age of Onset  . Colon cancer Father 41  . Heart disease Father   . Hypertension Father   . Esophageal cancer Neg Hx   . Rectal cancer Neg Hx   . Stomach cancer Neg Hx   . Alzheimer's disease Mother   . Thyroid disease Mother   . Hypertension Sister   . Thyroid disease Sister   . Mitral  valve prolapse Sister   . Hypertension Son   . Diabetes Son   . Hypertension Son   . Diabetes Son    Social history  is notable that [name] is widowed. [name] has 3 children and 9 grandchildren. [name] remains active. There is no alcohol tobacco use.   ROS General: Negative; No fevers, chills, or night sweats; positive for fatigue and shortness of breath HEENT: Negative; No changes in vision or hearing, sinus congestion, difficulty swallowing Pulmonary: Negative; No cough, wheezing, hemoptysis Cardiovascular: Negative; No chest Turner, presyncope, syncope, palpitations Positive for left leg claudication GI: Positive for GERD GU: Negative; No dysuria, hematuria, or difficulty voiding Musculoskeletal: Negative; no myalgias, joint Turner, or weakness Hematologic/Oncology: Negative; no easy bruising, bleeding Endocrine: Negative; no heat/cold intolerance; no diabetes Neuro: Negative; no changes in balance, headaches Skin: Negative; No rashes or skin lesions Psychiatric: Negative; No behavioral problems, depression Sleep: Negative; No snoring, daytime sleepiness, hypersomnolence, bruxism, restless legs, hypnogognic hallucinations, no cataplexy Other comprehensive 14 point system review is negative.   PE BP 144/86 mmHg  Pulse 59  Ht 5' 6"  (1.676 m)  Wt 195 lb 12.8 oz (88.814 kg)  BMI 31.62 kg/m2  Repeat blood pressure by me was 144/84  Wt Readings from Last 3 Encounters:  07/18/15 195 lb 12.8 oz (88.814 kg)  05/22/15 197 lb 9.6 oz (89.631 kg)  04/30/15 196 lb (88.905 kg)   General: Alert, oriented, no distress.  Skin: normal turgor, no rashes HEENT: Normocephalic, atraumatic. Pupils round and reactive; sclera anicteric;no lid lag.  Nose without nasal septal hypertrophy Mouth/Parynx benign; Mallinpatti scale 2 Neck: No JVD, no carotid bruits with normal carotid upstroke Chest wall: Nontender to palpation Lungs: clear to ausculatation and percussion; no wheezing or rales Heart: RRR, s1 s2  normal 1/6 systolic murmur, with a faint aortic insufficiency murmur;  No S3 gallop.  No diastolic murmur rubs thrills or heaves. Abdomen: soft, nontender; no hepatosplenomehaly, BS+; abdominal aorta nontender and not dilated by palpation. Back: No CVA tenderness Pulses 2+ , except diminished in the left lower extremity Extremities: Trivial right greater than left ankle edema ;no clubbing cyanosis, Homan's sign negative  Neurologic: grossly nonfocal Psychologic: normal affect and mood.  ECG (independently read by me): Sinus bradycardia 59 bpm.  No ectopy.  Normal intervals.  QTC 457 ms.  05/22/2015 ECG (independently read by me): Normal sinus rhythm at 60 bpm.  QTc interval 454 ms.  No significant ST segment changes.  June 2016 ECG (independently read by me): Normal sinus rhythm at 63 bpm.  Poor precordial R-wave progression.  No ectopy.  November 2015 ECG (independently read by me);  Normal sinus rhythm at 59 bpm.  QTc interval 415 ms.  No significant ST segment changes.  Prior November 2014ECG: Sinus rhythm at 63 beats per minute. Normal intervals.  LABS: BMP Latest Ref Rng 04/20/2015 04/18/2015 04/17/2015  Glucose 65 - 99 mg/dL 98 103(H) 105(H)  BUN 6 - 20 mg/dL 16 21(H) 22(H)  Creatinine 0.44 - 1.00 mg/dL 0.92 1.07(H) 1.24(H)  Sodium 135 - 145 mmol/L 140 141 142  Potassium 3.5 - 5.1 mmol/L 4.6 3.7 3.8  Chloride 101 - 111 mmol/L 110 107 104  CO2 22 - 32 mmol/L 22 25 25   Calcium 8.9 - 10.3 mg/dL 9.5 9.3 9.6   Hepatic Function Latest Ref Rng 04/17/2015 07/24/2014 08/08/2013  Total Protein 6.5 - 8.1 g/dL 6.4(L) 7.2 6.9  Albumin 3.5 - 5.0 g/dL 3.7 4.4 4.4  AST 15 - 41 U/L 31 21 21   ALT 14 - 54 U/L 22 20 22   Alk Phosphatase 38 - 126 U/L 47 58 67  Total Bilirubin 0.3 - 1.2 mg/dL 1.0 1.0 0.8  Bilirubin, Direct 0.0 - 0.3 mg/dL - - -   CBC Latest Ref Rng 04/18/2015 04/17/2015 03/09/2015  WBC 4.0 - 10.5 K/uL 6.4 7.3 6.1  Hemoglobin 12.0 - 15.0 g/dL 15.7(H) 16.3(H) 15.2(H)  Hematocrit 36.0 -  46.0 % 46.3(H) 48.0(H) 43.9  Platelets 150 - 400 K/uL 179 205 192   Lab Results  Component Value Date   MCV 91.1 04/18/2015   MCV 91.6 04/17/2015   MCV 89.8 03/09/2015   Lab Results  Component Value Date   TSH 2.909 04/18/2015   Lab Results  Component Value Date   HGBA1C  05/15/2007    5.8 (NOTE)   The ADA recommends the following therapeutic goals for glycemic   control related to Hgb A1C measurement:   Goal of Therapy:   < 7.0% Hgb A1C   Action Suggested:  > 8.0% Hgb A1C   Ref:  Diabetes Care, 22, Suppl. 1, 1999   Lipid Panel     Component Value Date/Time   CHOL 216* 07/24/2014 1027   TRIG 210* 07/24/2014 1027   HDL 53 07/24/2014 1027   CHOLHDL 4.1 07/24/2014 1027   VLDL 42* 07/24/2014 1027   LDLCALC 121* 07/24/2014 1027   LDLDIRECT 152.3 02/10/2012 1010    RADIOLOGY: US Breast Right  07/04/2013   CLINICAL DATA:  Abnormal screening right mammogram.  EXAM: DIGITAL DIAGNOSTIC  right MAMMOGRAM  ULTRASOUND right BREAST  COMPARISON:  With prior exams.  ACR Breast Density Category b: There are scattered areas of fibroglandular density.  FINDINGS: Spot compression views of the lateral aspect of the right breast were performed. There is persistence of a 4 mm low-density nodule in the posterior 3rd of the breast. There are no malignant type microcalcifications.  On physical exam I do not palpate a mass in the right breast.  Ultrasound is performed, showing there is a near anechoic lesion in the right breast at 7 o'clock 8 cm from the nipple measuring 3 x 2 x 3 mm.  IMPRESSION: Probable benign lesion in the  right breast.  RECOMMENDATION: Short-term interval followup right mammogram and ultrasound in 6 months is recommended to document stability.  I have discussed the findings and recommendations with the patient. Results were also provided in writing at the conclusion of the visit. If applicable, a reminder letter will be sent to the patient regarding the next appointment.  BI-RADS CATEGORY   3: Probably benign finding(s) - short interval follow-up suggested.   Electronically Signed   By: Lillia Mountain M.D.   On: 07/04/2013 09:01   Mm Livingston Wheeler R  07/04/2013   CLINICAL DATA:  Abnormal screening right mammogram.  EXAM: DIGITAL DIAGNOSTIC  right MAMMOGRAM  ULTRASOUND right BREAST  COMPARISON:  With prior exams.  ACR Breast Density Category b: There are scattered areas of fibroglandular density.  FINDINGS: Spot compression views of the lateral aspect of the right breast were performed. There is persistence of a 4 mm low-density nodule in the posterior 3rd of the breast. There are no malignant type microcalcifications.  On physical exam I do not palpate a mass in the right breast.  Ultrasound is performed, showing there is a near anechoic lesion in the right breast at 7 o'clock 8 cm from the nipple measuring 3 x 2 x 3 mm.  IMPRESSION: Probable benign lesion in the right breast.  RECOMMENDATION: Short-term interval followup right mammogram and ultrasound in 6 months is recommended to document stability.  I have discussed the findings and recommendations with the patient. Results were also provided in writing at the conclusion of the visit. If applicable, a reminder letter will be sent to the patient regarding the next appointment.  BI-RADS CATEGORY  3: Probably benign finding(s) - short interval follow-up suggested.   Electronically Signed   By: Lillia Mountain M.D.   On: 07/04/2013 09:01       ASSESSMENT AND PLAN: Ms. Comley is an 81 year old female who has a history of hypertension,  paroxysmal atrial fibrillation,  hyperlipidemia and peripheral vascular disease.  He had developed PAF and is on eliquis for anticoagulation and tolerating this well.  At the time for anticoagulation institution.  [name] was taken off aspirin and Plavix.  [name] has noticed improvement in her claudication symptomatology since being on anticoagulation.  [name] is maintaining sinus rhythm on sotalol 80 mg every 12 hours,   metoprolol tartrate 50 mg twice a day.  [name] does note some intermittent leg swelling and I have suggested [name] take Lasix at least every other day rather than every third until the swelling improves.  Her Holter monitor study did not reveal any recurrent AF.  [name] is now on Zetia for hyperlipidemia and in the past did not tolerate statin treatment.  I reviewed her recent laboratory.  As long as [name] remains stable, I will see her in 6 months for reevaluation.  Time spent: 25 minutes  Holly Sine, MD, [facility]  07/20/2015 2:52 PM

## 2015-08-16 ENCOUNTER — Other Ambulatory Visit: Payer: Self-pay | Admitting: Cardiology

## 2015-08-16 MED ORDER — SOTALOL HCL 80 MG PO TABS: 80.0000 mg | ORAL_TABLET | Freq: Two times a day (BID) | ORAL | Status: DC

## 2015-08-16 MED ORDER — FUROSEMIDE 20 MG PO TABS: ORAL_TABLET | ORAL | Status: DC

## 2015-08-16 MED ORDER — METOPROLOL TARTRATE 50 MG PO TABS: 50.0000 mg | ORAL_TABLET | Freq: Two times a day (BID) | ORAL | Status: DC

## 2015-08-16 MED ORDER — EZETIMIBE 10 MG PO TABS: 10.0000 mg | ORAL_TABLET | Freq: Every day | ORAL | Status: DC

## 2015-08-16 MED ORDER — APIXABAN 5 MG PO TABS: 5.0000 mg | ORAL_TABLET | Freq: Two times a day (BID) | ORAL | Status: DC

## 2015-08-17 ENCOUNTER — Other Ambulatory Visit: Payer: Self-pay

## 2015-08-17 MED ORDER — METOPROLOL TARTRATE 50 MG PO TABS: 50.0000 mg | ORAL_TABLET | Freq: Two times a day (BID) | ORAL | Status: DC

## 2015-08-21 ENCOUNTER — Other Ambulatory Visit: Payer: Self-pay | Admitting: Pharmacist Clinician (PhC)/ Clinical Pharmacy Specialist

## 2015-08-21 MED ORDER — FUROSEMIDE 20 MG PO TABS: 20.0000 mg | ORAL_TABLET | ORAL | Status: DC

## 2015-08-22 ENCOUNTER — Telehealth: Payer: Self-pay | Admitting: Cardiovascular Disease

## 2015-08-22 MED ORDER — APIXABAN 5 MG PO TABS: 5.0000 mg | ORAL_TABLET | Freq: Two times a day (BID) | ORAL | Status: DC

## 2015-08-22 NOTE — Telephone Encounter (Signed)
Patient calling the office for samples of medication:   1.  What medication and dosage are you requesting samples for? [name]  2.  Are you currently out of this medication? No,2 left

## 2015-08-22 NOTE — Telephone Encounter (Signed)
Called pt, she is aware I have provided samples for pickup.

## 2015-08-24 ENCOUNTER — Other Ambulatory Visit: Payer: Self-pay | Admitting: *Deleted

## 2015-08-24 MED ORDER — FUROSEMIDE 20 MG PO TABS: 20.0000 mg | ORAL_TABLET | ORAL | Status: DC

## 2015-08-30 ENCOUNTER — Encounter: Payer: Self-pay | Admitting: *Deleted

## 2015-08-31 ENCOUNTER — Telehealth: Payer: Self-pay

## 2015-08-31 NOTE — Telephone Encounter (Signed)
No phone number or mychart to contact this pt for flu shot

## 2015-09-17 ENCOUNTER — Telehealth: Payer: Self-pay | Admitting: Cardiovascular Disease

## 2015-09-17 MED ORDER — FUROSEMIDE 20 MG PO TABS: 20.0000 mg | ORAL_TABLET | ORAL | Status: DC

## 2015-09-17 MED ORDER — RANITIDINE HCL 300 MG PO TABS: 300.0000 mg | ORAL_TABLET | Freq: Every morning | ORAL | Status: DC

## 2015-09-17 MED ORDER — EZETIMIBE 10 MG PO TABS: 10.0000 mg | ORAL_TABLET | Freq: Every day | ORAL | Status: DC

## 2015-09-17 MED ORDER — METOPROLOL TARTRATE 50 MG PO TABS: 50.0000 mg | ORAL_TABLET | Freq: Two times a day (BID) | ORAL | Status: DC

## 2015-09-17 MED ORDER — SOTALOL HCL 80 MG PO TABS: 80.0000 mg | ORAL_TABLET | Freq: Two times a day (BID) | ORAL | Status: DC

## 2015-09-17 MED ORDER — APIXABAN 5 MG PO TABS: 5.0000 mg | ORAL_TABLET | Freq: Two times a day (BID) | ORAL | Status: DC

## 2015-09-17 NOTE — Telephone Encounter (Signed)
Meds refilled after verified doses and freq w patient. No further concerns at this time. Pt advised to call if other needs.

## 2015-09-17 NOTE — Telephone Encounter (Signed)
[name] is calling because she has sign up with [facility] and is needing new prescriptions for all of her medication that he prescribes to them . The fax number is [phone].. Thanks

## 2015-10-12 ENCOUNTER — Other Ambulatory Visit: Payer: Self-pay | Admitting: *Deleted

## 2015-10-12 MED ORDER — POTASSIUM CHLORIDE ER 10 MEQ PO TBCR: EXTENDED_RELEASE_TABLET | ORAL | Status: DC

## 2016-03-20 ENCOUNTER — Telehealth: Payer: Self-pay | Admitting: Internal Medicine

## 2016-03-20 NOTE — Telephone Encounter (Signed)
Patient is requesting [facility] referral for CVD visit on monday

## 2016-03-24 ENCOUNTER — Telehealth: Payer: Self-pay | Admitting: *Deleted

## 2016-03-24 ENCOUNTER — Ambulatory Visit: Payer: Commercial Managed Care - HMO | Admitting: Cardiovascular Disease

## 2016-03-24 ENCOUNTER — Telehealth: Payer: Self-pay | Admitting: Cardiovascular Disease

## 2016-03-24 MED ORDER — APIXABAN 5 MG PO TABS: 5.0000 mg | ORAL_TABLET | Freq: Two times a day (BID) | ORAL | 3 refills | Status: DC

## 2016-03-24 NOTE — Telephone Encounter (Signed)
Pt left msg on triage stating she has reach her [facility] and not able to get her elaquis. Wanting to know does MD have any samples that she can have...[name]

## 2016-03-24 NOTE — Telephone Encounter (Signed)
Called pt no answer LMOM w/MD response. Will leave up front for pick-up tomorrow...[name]

## 2016-03-24 NOTE — Telephone Encounter (Signed)
Patient transferred from team room C - she pressed option 3, spoke with someone, got transferred to NL triage  She needs samples of eliquis 5mg    We are out of samples - informed her we can send in short supply to local pharmacy, she can contact PCP, we can send message to clinical pharmacy staff for recommendations. Offered patient assistance form - did not seem interested. Sent in supply to [place] patient states she will contact PCP  Patient took her last dose of eliquis on Saturday She has issues w/cost of medication  Advised warfarin is generic alternative for anticoagulant but requires blood monitoring - she acted like she may have been on this before and it caused muscle aches  Message routed to clinical pharmacy staff for assistance if any available & to MD as Juluis Rainier

## 2016-03-24 NOTE — Telephone Encounter (Signed)
Yes, the samples are on my desk 

## 2016-03-25 NOTE — Telephone Encounter (Signed)
If she is not willing to take warfarin or do patient assistance for Eliquis, not sure what else can be done.  Could check to see if Xarelto has lower copay, but beyond that she needs to make a decision on what route to take.

## 2016-03-25 NOTE — Telephone Encounter (Signed)
Called patient w/pharmacist's advice. Reiterated that eliquis has a patient assistance program and informed her that qualification depends in part on having spent 3% of annual income on Rx. Explained that xarelto is alternative that may have cheaper co-pay if she wanted to look in to that.   Patient was able to get samples from PCP office.   Advised patient to call back if further assistance was needed.

## 2016-03-26 NOTE — Telephone Encounter (Signed)
Referral [id number]  start date 03/24/16, exp 09/20/16 good for 6 visits.

## 2016-04-07 ENCOUNTER — Encounter: Payer: Self-pay | Admitting: Gastroenterology

## 2016-04-17 ENCOUNTER — Encounter: Payer: Self-pay | Admitting: Nurse Practitioner

## 2016-04-17 ENCOUNTER — Ambulatory Visit (INDEPENDENT_AMBULATORY_CARE_PROVIDER_SITE_OTHER): Payer: Commercial Managed Care - HMO | Admitting: Nurse Practitioner

## 2016-04-17 VITALS — BP 142/78 | HR 69 | Temp 98.2°F | Ht 66.0 in | Wt 189.0 lb

## 2016-04-17 DIAGNOSIS — J069 Acute upper respiratory infection, unspecified: Secondary | ICD-10-CM | POA: Diagnosis not present

## 2016-04-17 DIAGNOSIS — J0140 Acute pansinusitis, unspecified: Secondary | ICD-10-CM | POA: Diagnosis not present

## 2016-04-17 MED ORDER — METHYLPREDNISOLONE ACETATE 40 MG/ML IJ SUSP
40.0000 mg | Freq: Once | INTRAMUSCULAR | Status: AC
  Administered 2016-04-17: 40 mg via INTRAMUSCULAR

## 2016-04-17 MED ORDER — BENZONATATE 100 MG PO CAPS: 100.0000 mg | ORAL_CAPSULE | Freq: Three times a day (TID) | ORAL | 0 refills | Status: DC | PRN

## 2016-04-17 MED ORDER — AMOXICILLIN-POT CLAVULANATE 875-125 MG PO TABS: 1.0000 | ORAL_TABLET | Freq: Two times a day (BID) | ORAL | 0 refills | Status: DC

## 2016-04-17 MED ORDER — METHYLPREDNISOLONE ACETATE 40 MG/ML IJ SUSP: 40.0000 mg | Freq: Once | INTRAMUSCULAR | Status: DC

## 2016-04-17 MED ORDER — GUAIFENESIN ER 600 MG PO TB12: 600.0000 mg | ORAL_TABLET | Freq: Two times a day (BID) | ORAL | 0 refills | Status: DC | PRN

## 2016-04-17 MED ORDER — ALBUTEROL SULFATE HFA 108 (90 BASE) MCG/ACT IN AERS: 2.0000 | INHALATION_SPRAY | Freq: Four times a day (QID) | RESPIRATORY_TRACT | 0 refills | Status: DC | PRN

## 2016-04-17 NOTE — Addendum Note (Signed)
Addended by: Resa Miner R on: 04/17/2016 03:09 PM   Modules accepted: Orders

## 2016-04-17 NOTE — Progress Notes (Signed)
Pre visit review using our clinic review tool, if applicable. No additional management support is needed unless otherwise documented below in the visit note. 

## 2016-04-17 NOTE — Patient Instructions (Addendum)
[facility] Instructions:  Use over-the-counter  "cold" medicines  such as "Tylenol cold" , "Advil cold",  "Mucinex" for cough and congestion. Avoid decongestants if you have high blood pressure.  use "Afrin" nasal spray for nasal congestion as directed instead x 3days only.  You can use plain "Tylenol" or "Advi"l for fever, chills and achyness.

## 2016-04-17 NOTE — Progress Notes (Signed)
Subjective:  Patient ID: [name], female    DOB: [birth year]  Age: 82 y.o. MRN: [id number]  CC: URI (Sore throat, productive cough (greyish -brown), HA,, fever x 5 days)   URI   This is a new problem. The current episode started in the past 7 days. The problem has been gradually worsening. Maximum temperature: did not  recorded. The fever has been present for less than 1 day (felt chills). Associated symptoms include congestion, coughing, headaches, rhinorrhea, sinus pain, a sore throat, swollen glands and wheezing. Pertinent negatives include no chest pain, ear pain, neck pain, rash, sneezing or vomiting. She has tried acetaminophen (mucinex DM) for the symptoms. The treatment provided mild relief.   Orlean Patten Neu presents for URI symptoms  Outpatient Medications Prior to Visit  Medication Sig Dispense Refill  . apixaban (ELIQUIS) 5 MG TABS tablet Take 1 tablet (5 mg total) by mouth 2 (two) times daily. 60 tablet 3  . Calcium Carbonate-Vitamin D (CALCIUM 600 + D PO) Take 2 tablets by mouth daily.    . furosemide (LASIX) 20 MG tablet Take 1 tablet (20 mg total) by mouth every other day. 45 tablet 3  . metoprolol (LOPRESSOR) 50 MG tablet Take 1 tablet (50 mg total) by mouth 2 (two) times daily. 180 tablet 3  . Multiple Vitamin (MULTIVITAMIN PO) Take 1 tablet by mouth daily.      . potassium chloride (K-DUR) 10 MEQ tablet Take 1 tablet on the days that you take the furosemide. 90 tablet 1  . ranitidine (ZANTAC) 300 MG tablet Take 1 tablet (300 mg total) by mouth every morning. 90 tablet 3  . sotalol (BETAPACE) 80 MG tablet Take 1 tablet (80 mg total) by mouth every 12 (twelve) hours. 180 tablet 3  . ezetimibe (ZETIA) 10 MG tablet Take 1 tablet (10 mg total) by mouth daily. (Patient not taking: Reported on 04/17/2016) 90 tablet 3   No facility-administered medications prior to visit.     ROS Review of Systems  HENT: Positive for congestion, rhinorrhea and sore throat. Negative for  ear pain and sneezing.   Respiratory: Positive for cough and wheezing.   Cardiovascular: Negative for chest pain.  Gastrointestinal: Negative for vomiting.  Musculoskeletal: Negative for neck pain.  Skin: Negative for rash.  Neurological: Positive for headaches.    Objective:  BP (!) 142/78 (BP Location: Left Arm, Patient Position: Sitting, Cuff Size: Normal)   Pulse 69   Temp 98.2 F (36.8 C) (Oral)   Ht 5\' 6"  (1.676 m)   Wt 189 lb (85.7 kg)   SpO2 96%   BMI 30.51 kg/m   BP Readings from Last 3 Encounters:  04/17/16 (!) 142/78  07/18/15 (!) 144/86  05/22/15 (!) 158/84    Wt Readings from Last 3 Encounters:  04/17/16 189 lb (85.7 kg)  07/18/15 195 lb 12.8 oz (88.8 kg)  05/22/15 197 lb 9.6 oz (89.6 kg)    Physical Exam  Constitutional: She appears well-developed. No distress.  HENT:  Right Ear: Tympanic membrane and ear canal normal.  Left Ear: Tympanic membrane and ear canal normal.  Nose: Mucosal edema and rhinorrhea present. Right sinus exhibits maxillary sinus tenderness and frontal sinus tenderness. Left sinus exhibits maxillary sinus tenderness and frontal sinus tenderness.  Mouth/Throat: Uvula is midline. Posterior oropharyngeal erythema present. No oropharyngeal exudate.  Eyes: No scleral icterus.  Cardiovascular: Normal rate.   Pulmonary/Chest: Effort normal and breath sounds normal. No stridor. She exhibits no tenderness.  Lymphadenopathy:  She has no cervical adenopathy.  Vitals reviewed.   Lab Results  Component Value Date   WBC 6.4 04/18/2015   HGB 15.7 (H) 04/18/2015   HCT 46.3 (H) 04/18/2015   PLT 179 04/18/2015   GLUCOSE 98 04/20/2015   CHOL 216 (H) 07/24/2014   TRIG 210 (H) 07/24/2014   HDL 53 07/24/2014   LDLDIRECT 152.3 02/10/2012   LDLCALC 121 (H) 07/24/2014   ALT 22 04/17/2015   AST 31 04/17/2015   NA 140 04/20/2015   K 4.6 04/20/2015   CL 110 04/20/2015   CREATININE 0.92 04/20/2015   BUN 16 04/20/2015   CO2 22 04/20/2015   TSH  2.909 04/18/2015   INR 1.32 04/18/2015   HGBA1C  05/15/2007    5.8 (NOTE)   The ADA recommends the following therapeutic goals for glycemic   control related to Hgb A1C measurement:   Goal of Therapy:   < 7.0% Hgb A1C   Action Suggested:  > 8.0% Hgb A1C   Ref:  Diabetes Care, 22, Suppl. 1, 1999    Mm Digital Screening Bilateral  Result Date: 07/20/2015 CLINICAL DATA:  Screening. EXAM: DIGITAL SCREENING BILATERAL MAMMOGRAM WITH CAD COMPARISON:  Previous exam(s). ACR Breast Density Category b: There are scattered areas of fibroglandular density. FINDINGS: There are no findings suspicious for malignancy. Images were processed with CAD. IMPRESSION: No mammographic evidence of malignancy. A result letter of this screening mammogram will be mailed directly to the patient. RECOMMENDATION: Screening mammogram in one year. (Code:SM-B-01Y) BI-RADS CATEGORY  1: Negative. Electronically Signed   By: Nolon Nations M.D.   On: 07/20/2015 07:44    Assessment & Plan:   Justyne was seen today for uri.  Diagnoses and all orders for this visit:  Acute URI -     methylPREDNISolone acetate (DEPO-MEDROL) injection 40 mg; Inject 1 mL (40 mg total) into the muscle once. -     guaiFENesin (MUCINEX) 600 MG 12 hr tablet; Take 1 tablet (600 mg total) by mouth 2 (two) times daily as needed for cough or to loosen phlegm. -     amoxicillin-clavulanate (AUGMENTIN) 875-125 MG tablet; Take 1 tablet by mouth 2 (two) times daily. -     albuterol (PROVENTIL HFA;VENTOLIN HFA) 108 (90 Base) MCG/ACT inhaler; Inhale 2 puffs into the lungs every 6 (six) hours as needed for wheezing or shortness of breath.  Acute pansinusitis, recurrence not specified -     methylPREDNISolone acetate (DEPO-MEDROL) injection 40 mg; Inject 1 mL (40 mg total) into the muscle once. -     guaiFENesin (MUCINEX) 600 MG 12 hr tablet; Take 1 tablet (600 mg total) by mouth 2 (two) times daily as needed for cough or to loosen phlegm. -      amoxicillin-clavulanate (AUGMENTIN) 875-125 MG tablet; Take 1 tablet by mouth 2 (two) times daily.  Other orders -     benzonatate (TESSALON) 100 MG capsule; Take 1 capsule (100 mg total) by mouth 3 (three) times daily as needed for cough.   I am having Ms. Bergfeld start on guaiFENesin, amoxicillin-clavulanate, albuterol, and benzonatate. I am also having her maintain her Multiple Vitamin (MULTIVITAMIN PO), Calcium Carbonate-Vitamin D (CALCIUM 600 + D PO), ezetimibe, furosemide, metoprolol, sotalol, ranitidine, potassium chloride, and apixaban. We will continue to administer methylPREDNISolone acetate.  Meds ordered this encounter  Medications  . methylPREDNISolone acetate (DEPO-MEDROL) injection 40 mg  . guaiFENesin (MUCINEX) 600 MG 12 hr tablet    Sig: Take 1 tablet (600 mg total) by mouth  2 (two) times daily as needed for cough or to loosen phlegm.    Dispense:  14 tablet    Refill:  0    Order Specific Question:   Supervising Provider    Answer:   Cassandria Anger [[id number]]  . amoxicillin-clavulanate (AUGMENTIN) 875-125 MG tablet    Sig: Take 1 tablet by mouth 2 (two) times daily.    Dispense:  14 tablet    Refill:  0    Order Specific Question:   Supervising Provider    Answer:   Cassandria Anger [[id number]]  . albuterol (PROVENTIL HFA;VENTOLIN HFA) 108 (90 Base) MCG/ACT inhaler    Sig: Inhale 2 puffs into the lungs every 6 (six) hours as needed for wheezing or shortness of breath.    Dispense:  1 Inhaler    Refill:  0    Order Specific Question:   Supervising Provider    Answer:   Cassandria Anger [[id number]]  . benzonatate (TESSALON) 100 MG capsule    Sig: Take 1 capsule (100 mg total) by mouth 3 (three) times daily as needed for cough.    Dispense:  20 capsule    Refill:  0    Order Specific Question:   Supervising Provider    Answer:   Cassandria Anger [[id number]]     Follow-up: Return if symptoms worsen or fail to improve.  Wilfred Lacy, NP

## 2016-04-18 ENCOUNTER — Telehealth: Payer: Self-pay | Admitting: Internal Medicine

## 2016-04-18 NOTE — Telephone Encounter (Signed)
Pt stating [name] is not helping with her cough, can we send in something stronger to [facility]. Please advise, she is allergic to promethazine w/codein.

## 2016-04-21 MED ORDER — PROMETHAZINE-DM 6.25-15 MG/5ML PO SYRP: 2.5000 mL | ORAL_SOLUTION | Freq: Three times a day (TID) | ORAL | 0 refills | Status: DC | PRN

## 2016-04-21 NOTE — Telephone Encounter (Signed)
Prescription changed to promehazine DM.

## 2016-04-21 NOTE — Telephone Encounter (Signed)
I called phone number on this message and the voicemail for this number is not the same name as the patient, so I only left message for patient to call our office back---if patient calls back, [place], NP has sent in new rx for cough to [facility] that patient can pick up---can talk with Dakai Braithwaite if any questions

## 2016-05-27 ENCOUNTER — Telehealth: Payer: Self-pay | Admitting: Emergency Medicine

## 2016-05-27 NOTE — Telephone Encounter (Signed)
Can you verify if a referral is needed?

## 2016-05-27 NOTE — Telephone Encounter (Signed)
Pt called and needs a referral put in for Dr Claiborne Billings in Cardiology for 10/11. She also needs a referral for 10/24 Dr Satira Sark with [facility].  Please advise thanks.

## 2016-05-27 NOTE — Telephone Encounter (Signed)
This is just for a [facility] referral. I have it taken care of

## 2016-06-11 ENCOUNTER — Ambulatory Visit (INDEPENDENT_AMBULATORY_CARE_PROVIDER_SITE_OTHER): Payer: Commercial Managed Care - HMO | Admitting: Cardiovascular Disease

## 2016-06-11 ENCOUNTER — Encounter: Payer: Self-pay | Admitting: Cardiovascular Disease

## 2016-06-11 ENCOUNTER — Encounter (INDEPENDENT_AMBULATORY_CARE_PROVIDER_SITE_OTHER): Payer: Self-pay

## 2016-06-11 VITALS — BP 134/86 | HR 57 | Ht 66.0 in | Wt 190.0 lb

## 2016-06-11 DIAGNOSIS — Z79899 Other long term (current) drug therapy: Secondary | ICD-10-CM

## 2016-06-11 DIAGNOSIS — Z7901 Long term (current) use of anticoagulants: Secondary | ICD-10-CM

## 2016-06-11 DIAGNOSIS — E7800 Pure hypercholesterolemia, unspecified: Secondary | ICD-10-CM

## 2016-06-11 DIAGNOSIS — R5383 Other fatigue: Secondary | ICD-10-CM

## 2016-06-11 DIAGNOSIS — R5381 Other malaise: Secondary | ICD-10-CM

## 2016-06-11 DIAGNOSIS — I4891 Unspecified atrial fibrillation: Secondary | ICD-10-CM | POA: Diagnosis not present

## 2016-06-11 DIAGNOSIS — I10 Essential (primary) hypertension: Secondary | ICD-10-CM | POA: Diagnosis not present

## 2016-06-11 DIAGNOSIS — L298 Other pruritus: Secondary | ICD-10-CM

## 2016-06-11 DIAGNOSIS — T50905A Adverse effect of unspecified drugs, medicaments and biological substances, initial encounter: Secondary | ICD-10-CM

## 2016-06-11 DIAGNOSIS — I480 Paroxysmal atrial fibrillation: Secondary | ICD-10-CM

## 2016-06-11 LAB — CBC WITH DIFFERENTIAL/PLATELET
BASOS ABS: 0 {cells}/uL (ref 0–200)
BASOS PCT: 0 %
EOS ABS: 138 {cells}/uL (ref 15–500)
Eosinophils Relative: 3 %
HEMATOCRIT: 47.5 % — AB (ref 35.0–45.0)
Hemoglobin: 15.8 g/dL — ABNORMAL HIGH (ref 11.7–15.5)
LYMPHS PCT: 38 %
Lymphs Abs: 1748 cells/uL (ref 850–3900)
MCH: 30 pg (ref 27.0–33.0)
MCHC: 33.3 g/dL (ref 32.0–36.0)
MCV: 90.3 fL (ref 80.0–100.0)
MONO ABS: 506 {cells}/uL (ref 200–950)
MPV: 8.6 fL (ref 7.5–12.5)
Monocytes Relative: 11 %
Neutro Abs: 2208 cells/uL (ref 1500–7800)
Neutrophils Relative %: 48 %
Platelets: 176 10*3/uL (ref 140–400)
RBC: 5.26 MIL/uL — ABNORMAL HIGH (ref 3.80–5.10)
RDW: 14.1 % (ref 11.0–15.0)
WBC: 4.6 10*3/uL (ref 3.8–10.8)

## 2016-06-11 LAB — COMPREHENSIVE METABOLIC PANEL
ALBUMIN: 4.2 g/dL (ref 3.6–5.1)
ALK PHOS: 69 U/L (ref 33–130)
ALT: 13 U/L (ref 6–29)
AST: 20 U/L (ref 10–35)
BUN: 17 mg/dL (ref 7–25)
CALCIUM: 9.6 mg/dL (ref 8.6–10.4)
CHLORIDE: 104 mmol/L (ref 98–110)
CO2: 26 mmol/L (ref 20–31)
Creat: 0.87 mg/dL (ref 0.60–0.88)
Glucose, Bld: 84 mg/dL (ref 65–99)
POTASSIUM: 4.3 mmol/L (ref 3.5–5.3)
Sodium: 142 mmol/L (ref 135–146)
TOTAL PROTEIN: 6.9 g/dL (ref 6.1–8.1)
Total Bilirubin: 1.3 mg/dL — ABNORMAL HIGH (ref 0.2–1.2)

## 2016-06-11 LAB — LIPID PANEL
CHOL/HDL RATIO: 4.8 ratio (ref <=5.0)
CHOLESTEROL: 253 mg/dL — AB (ref 125–200)
HDL: 53 mg/dL (ref >=46)
LDL Cholesterol: 153 mg/dL — ABNORMAL HIGH (ref <130)
TRIGLYCERIDES: 234 mg/dL — AB (ref <150)
VLDL: 47 mg/dL — AB (ref <30)

## 2016-06-11 LAB — TSH: TSH: 1.63 mIU/L

## 2016-06-11 LAB — MAGNESIUM: Magnesium: 1.8 mg/dL (ref 1.5–2.5)

## 2016-06-11 NOTE — Patient Instructions (Addendum)
Your physician recommends that you return for lab work fasting.   Your physician wants you to follow-up in: 1 year or sooner if needed. You will receive a reminder letter in the mail two months in advance. If you don't receive a letter, please call our office to schedule the follow-up appointment.  If you need a refill on your cardiac medications before your next appointment, please call your pharmacy.    

## 2016-06-13 NOTE — Progress Notes (Signed)
Patient ID: [name], female   DOB: Mar 31, 1933, 82 y.o.   MRN: [id number]    PCP: Dr. Eilleen Kempf  HPI: [name] is a 82 y.o. female who presents for an 68 month cardiology evaluation.  [name] has a history of paroxysmal atrial fibrillation, hypertension, as well as hyperlipidemia. Remotely, she developed myalgias with simvastatin and had not been willing to try additional lipid lowering therapy. In August 2012 an echo Doppler study showed mild asymmetric LVH with proximal septal thickening without LVOT obstruction. She had grade 1 diastolic dysfunction with normal systolic function, mild MR, mild TR, and aortic valve sclerosis with mild MR.   In December 2014 follow-up blood work showed normal renal function with a BUN of 19, creatinine 0.8.  She continued to have significant hyperlipidemia with a total cholesterol of 250, triglycerides 221 HDL 53, VLDL 44 and LDL cholesterol 153.  TSH was normal at 2.275.   When I last saw [name] she had noticed development of claudication, particularly involving her left leg with activity.  She also admits to expressing episodes of shortness of breath and fatigue.  She recently underwent a lower extremity arterial Doppler study which was abnormal and showed an ABI of 0.63 on the left and 1.0 on the right.  The left common iliac, external iliac, common femoral artery and profunda demonstrated multiphasic flow.  However, the superficial femoral artery on the left demonstrated occlusive disease in the proximal to mid thigh with reconstitution of flow in the mid distal segment.  The popliteal artery demonstrated patent monophasic flow with three-vessel runoff.  The anterior tibial artery demonstrated focal stenosis in the proximal calf with dampened flow distal to this.  She presented to [facility] in August 2016 with complaints of palpitations, shortness of breath and presyncope.  She was noted to be in atrial fibrillation with rapid ventricular  response.  Her heart rate was initially attempted to be controlled with metoprolol and digitoxin, which was not successful and ultimately sotalol was started.  On 04/20/2015 she underwent successful TEE guided cardioversion.  She was continued on metoprolol as well as eliquis for anticoagulation.  An echo Doppler study on 04/18/2015 showed an EF of 55-60% with mild LVH.  She has multiple allergies and in the past did not tolerate ACE-inhibition or ARB therapy has been able to tolerate direct renin inhibition.  She has a history of GERD and has been taking ranitidine and sucralfate.  She denies chest tightness but does admit to shortness of breath with activity as well as increasing fatigue.  She denies presyncope or syncope.  She denies PND or orthopnea.  Presently she admits to episodes of itching, occasional sweating, and at times has had some mild diarrhea.  She is unaware of recurrent atrial fibrillation and her monitor did not detect any recurrent AF.  She denies chest pain.  She denies bleeding.  She states her claudication is better in her left leg since she's been started on anticoagulation therapy.  She admits to trace ankle swelling right greater than left.  She presents for follow-up evaluation.  Past Medical History:  Diagnosis Date  . Abnormal nuclear stress test    mild anterolateral septal and inferior ischemia  . Arthritis   . Atherosclerosis of lower extremity with claudication ([place]) 04/17/2015   LEFT LEG  . Atrial fibrillation ([place])   . Claudication ([place])   . Colon cancer ([place])   . Coronary atherosclerosis of unspecified type of vessel, native or graft   .  Esophageal stricture   . Fatty liver 11/05/11  . Hiatal hernia   . HLD (hyperlipidemia)   . Hypertension   . Iron deficiency anemia, unspecified   . Ischemic colitis ([place])   . Osteoporosis   . PAF (paroxysmal atrial fibrillation) ([place]) 04/2015  . Unspecified vascular insufficiency of intestine     Past Surgical History:    Procedure Laterality Date  . abdominoperineal resection anastomotic stricture  05/2007  . APPENDECTOMY    . CARDIAC CATHETERIZATION N/A 03/13/2015   Procedure: Left Heart Cath and Coronary Angiography;  Surgeon: Troy Sine, MD;  Location: [facility];  Service: Cardiovascular;  Laterality: N/A;  . CARDIOVERSION N/A 04/20/2015   Procedure: CARDIOVERSION;  Surgeon: Jerline Pain, MD;  Location: [place];  Service: Cardiovascular;  Laterality: N/A;  . [place]  . ESOPHAGOGASTRODUODENOSCOPY N/A 04/27/2014   Procedure: ESOPHAGOGASTRODUODENOSCOPY (EGD);  Surgeon: Lafayette Dragon, MD;  Location: [facility];  Service: Endoscopy;  Laterality: N/A;  . Mandibular Renstruction    . ROTATOR CUFF REPAIR  2006   left  . Rt. Salpingo oophorectomy and cyst removal  2005  . SAVORY DILATION N/A 04/27/2014   Procedure: SAVORY DILATION;  Surgeon: Lafayette Dragon, MD;  Location: [facility];  Service: Endoscopy;  Laterality: N/A;  no xray needed  . sigmoid resection for invasive rectal adenocarcinoma  12/2006  . TEE WITHOUT CARDIOVERSION N/A 04/20/2015   Procedure: TRANSESOPHAGEAL ECHOCARDIOGRAM (TEE);  Surgeon: Jerline Pain, MD;  Location: [place];  Service: Cardiovascular;  Laterality: N/A;  . TUBAL LIGATION    . WRIST SURGERY  2008   right    Allergies  Allergen Reactions  . Acetaminophen-Codeine   . Amlodipine Besylate     REACTION: swelling  . Diltiazem Hcl     REACTION: rash  . Hydrocodone-Acetaminophen     nausea  . Irbesartan-Hydrochlorothiazide     REACTION: Dizziness  . Lisinopril     cough  . Omeprazole Nausea Only and Other (See Comments)    Dizziness, joint pain, weakness in legs, cramps in legs, stomach burned  . Propoxyphene N-Acetaminophen     Headache, and blotchy face  . Valsartan     REACTION: blurred vision  . Warfarin Sodium     REACTION: body aches and bleeding  . Verapamil Nausea Only, Palpitations and Other (See Comments)     Headaches     Current Outpatient Prescriptions  Medication Sig Dispense Refill  . apixaban (ELIQUIS) 5 MG TABS tablet Take 1 tablet (5 mg total) by mouth 2 (two) times daily. 60 tablet 3  . Calcium Carbonate-Vitamin D (CALCIUM 600 + D PO) Take 2 tablets by mouth daily.    Marland Kitchen ezetimibe (ZETIA) 10 MG tablet Take 1 tablet (10 mg total) by mouth daily. 90 tablet 3  . furosemide (LASIX) 20 MG tablet Take 1 tablet (20 mg total) by mouth every other day. 45 tablet 3  . metoprolol (LOPRESSOR) 50 MG tablet Take 1 tablet (50 mg total) by mouth 2 (two) times daily. 180 tablet 3  . Multiple Vitamin (MULTIVITAMIN PO) Take 1 tablet by mouth daily.      . potassium chloride (K-DUR) 10 MEQ tablet Take 1 tablet on the days that you take the furosemide. 90 tablet 1  . ranitidine (ZANTAC) 300 MG tablet Take 1 tablet (300 mg total) by mouth every morning. 90 tablet 3  . sotalol (BETAPACE) 80 MG tablet Take 1 tablet (80 mg total) by  mouth every 12 (twelve) hours. 180 tablet 3   Current Facility-Administered Medications  Medication Dose Route Frequency Provider Last Rate Last Dose  . methylPREDNISolone acetate (DEPO-MEDROL) injection 40 mg  40 mg Intramuscular Once Flossie Buffy, NP        Social History   Social History  . Marital status: Widowed    Spouse name: N/A  . Number of children: 4  . Years of education: N/A   Occupational History  . retired Retired   Social History Main Topics  . Smoking status: Never Smoker  . Smokeless tobacco: Never Used  . Alcohol use No  . Drug use: No  . Sexual activity: Not Currently   Other Topics Concern  . Not on file   Social History Narrative  . No narrative on file    Family History  Problem Relation Age of Onset  . Colon cancer Father 6  . Heart disease Father   . Hypertension Father   . Alzheimer's disease Mother   . Thyroid disease Mother   . Hypertension Sister   . Thyroid disease Sister   . Mitral valve prolapse Sister   .  Hypertension Son   . Diabetes Son   . Hypertension Son   . Diabetes Son   . Esophageal cancer Neg Hx   . Rectal cancer Neg Hx   . Stomach cancer Neg Hx    Social history  is notable that she is widowed. She has 3 children and 9 grandchildren. She remains active. There is no alcohol tobacco use.   ROS General: Negative; No fevers, chills, or night sweats; positive for fatigue and shortness of breath HEENT: Negative; No changes in vision or hearing, sinus congestion, difficulty swallowing Pulmonary: Negative; No cough, wheezing, hemoptysis Cardiovascular: Negative; No chest pain, presyncope, syncope, palpitations Positive for left leg claudication GI: Positive for GERD GU: Negative; No dysuria, hematuria, or difficulty voiding Musculoskeletal: Negative; no myalgias, joint pain, or weakness Hematologic/Oncology: Negative; no easy bruising, bleeding Endocrine: Negative; no heat/cold intolerance; no diabetes Neuro: Negative; no changes in balance, headaches Skin: Negative; No rashes or skin lesions Psychiatric: Negative; No behavioral problems, depression Sleep: Negative; No snoring, daytime sleepiness, hypersomnolence, bruxism, restless legs, hypnogognic hallucinations, no cataplexy Other comprehensive 14 point system review is negative.   PE BP 134/86   Pulse (!) 57   Ht _0  (1.676 m)   Wt 190 lb (86.2 kg)   BMI 30.67 kg/m    Repeat blood pressure by me was 132/84  Wt Readings from Last 3 Encounters:  06/11/16 190 lb (86.2 kg)  04/17/16 189 lb (85.7 kg)  07/18/15 195 lb 12.8 oz (88.8 kg)   General: Alert, oriented, no distress.  Skin: normal turgor, no rashes HEENT: Normocephalic, atraumatic. Pupils round and reactive; sclera anicteric;no lid lag.  Nose without nasal septal hypertrophy Mouth/Parynx benign; Mallinpatti scale 2 Neck: No JVD, no carotid bruits with normal carotid upstroke Chest wall: Nontender to palpation Lungs: clear to ausculatation and percussion;  no wheezing or rales Heart: RRR, s1 s2 normal 1/6 systolic murmur, with a faint aortic insufficiency murmur;  No S3 gallop.  No diastolic murmur rubs thrills or heaves. Abdomen: soft, nontender; no hepatosplenomehaly, BS+; abdominal aorta nontender and not dilated by palpation. Back: No CVA tenderness Pulses 2+ , except diminished in the left lower extremity Extremities: Trivial ankle edema ;no clubbing cyanosis, Homan's sign negative  Neurologic: grossly nonfocal Psychologic: normal affect and mood.  ECG (independently read by me): Sinus bradycardia 57 bpm.  No ectopy.  Normal intervals.  November 2016 ECG (independently read by me): Sinus bradycardia 59 bpm.  No ectopy.  Normal intervals.  QTC 457 ms.  05/22/2015 ECG (independently read by me): Normal sinus rhythm at 60 bpm.  QTc interval 454 ms.  No significant ST segment changes.  June 2016 ECG (independently read by me): Normal sinus rhythm at 63 bpm.  Poor precordial R-wave progression.  No ectopy.  November 2015 ECG (independently read by me);  Normal sinus rhythm at 59 bpm.  QTc interval 415 ms.  No significant ST segment changes.  Prior November 2014ECG: Sinus rhythm at 63 beats per minute. Normal intervals.  LABS: BMP Latest Ref Rng & Units 06/11/2016 04/20/2015 04/18/2015  Glucose 65 - 99 mg/dL 84 98 103(H)  BUN 7 - 25 mg/dL 17 16 21(H)  Creatinine 0.60 - 0.88 mg/dL 0.87 0.92 1.07(H)  Sodium 135 - 146 mmol/L 142 140 141  Potassium 3.5 - 5.3 mmol/L 4.3 4.6 3.7  Chloride 98 - 110 mmol/L 104 110 107  CO2 20 - 31 mmol/L _0 Calcium 8.6 - 10.4 mg/dL 9.6 9.5 9.3   Hepatic Function Latest Ref Rng & Units 06/11/2016 04/17/2015 07/24/2014  Total Protein 6.1 - 8.1 g/dL 6.9 6.4(L) 7.2  Albumin 3.6 - 5.1 g/dL 4.2 3.7 4.4  AST 10 - 35 U/L _1 ALT 6 - 29 U/L _2 Alk Phosphatase 33 - 130 U/L 69 47 58  Total Bilirubin 0.2 - 1.2 mg/dL 1.3(H) 1.0 1.0  Bilirubin, Direct 0.0 - 0.3 mg/dL - - -   CBC Latest Ref Rng &  Units 06/11/2016 04/18/2015 04/17/2015  WBC 3.8 - 10.8 K/uL 4.6 6.4 7.3  Hemoglobin 11.7 - 15.5 g/dL 15.8(H) 15.7(H) 16.3(H)  Hematocrit 35.0 - 45.0 % 47.5(H) 46.3(H) 48.0(H)  Platelets 140 - 400 K/uL 176 179 205   Lab Results  Component Value Date   MCV 90.3 06/11/2016   MCV 91.1 04/18/2015   MCV 91.6 04/17/2015   Lab Results  Component Value Date   TSH 1.63 06/11/2016   Lab Results  Component Value Date   HGBA1C  05/15/2007    5.8 (NOTE)   The ADA recommends the following therapeutic goals for glycemic   control related to Hgb A1C measurement:   Goal of Therapy:   < 7.0% Hgb A1C   Action Suggested:  > 8.0% Hgb A1C   Ref:  Diabetes Care, 22, Suppl. 1, 1999   Lipid Panel     Component Value Date/Time   CHOL 253 (H) 06/11/2016 1154   TRIG 234 (H) 06/11/2016 1154   HDL 53 06/11/2016 1154   CHOLHDL 4.8 06/11/2016 1154   VLDL 47 (H) 06/11/2016 1154   LDLCALC 153 (H) 06/11/2016 1154   LDLDIRECT 152.3 02/10/2012 1010    RADIOLOGY: US Breast Right  07/04/2013   CLINICAL DATA:  Abnormal screening right mammogram.  EXAM: DIGITAL DIAGNOSTIC  right MAMMOGRAM  ULTRASOUND right BREAST  COMPARISON:  With prior exams.  ACR Breast Density Category b: There are scattered areas of fibroglandular density.  FINDINGS: Spot compression views of the lateral aspect of the right breast were performed. There is persistence of a 4 mm low-density nodule in the posterior 3rd of the breast. There are no malignant type microcalcifications.  On physical exam I do not palpate a mass in the right breast.  Ultrasound is performed, showing there is a near anechoic lesion in the right breast at 7 o'clock 8  cm from the nipple measuring 3 x 2 x 3 mm.  IMPRESSION: Probable benign lesion in the right breast.  RECOMMENDATION: Short-term interval followup right mammogram and ultrasound in 6 months is recommended to document stability.  I have discussed the findings and recommendations with the patient. Results were also  provided in writing at the conclusion of the visit. If applicable, a reminder letter will be sent to the patient regarding the next appointment.  BI-RADS CATEGORY  3: Probably benign finding(s) - short interval follow-up suggested.   Electronically Signed   By: Lillia Mountain M.D.   On: 07/04/2013 09:01   Mm [place] R  07/04/2013   CLINICAL DATA:  Abnormal screening right mammogram.  EXAM: DIGITAL DIAGNOSTIC  right MAMMOGRAM  ULTRASOUND right BREAST  COMPARISON:  With prior exams.  ACR Breast Density Category b: There are scattered areas of fibroglandular density.  FINDINGS: Spot compression views of the lateral aspect of the right breast were performed. There is persistence of a 4 mm low-density nodule in the posterior 3rd of the breast. There are no malignant type microcalcifications.  On physical exam I do not palpate a mass in the right breast.  Ultrasound is performed, showing there is a near anechoic lesion in the right breast at 7 o'clock 8 cm from the nipple measuring 3 x 2 x 3 mm.  IMPRESSION: Probable benign lesion in the right breast.  RECOMMENDATION: Short-term interval followup right mammogram and ultrasound in 6 months is recommended to document stability.  I have discussed the findings and recommendations with the patient. Results were also provided in writing at the conclusion of the visit. If applicable, a reminder letter will be sent to the patient regarding the next appointment.  BI-RADS CATEGORY  3: Probably benign finding(s) - short interval follow-up suggested.   Electronically Signed   By: Lillia Mountain M.D.   On: 07/04/2013 09:01      ASSESSMENT AND PLAN: [name] is an 82 year old female who has a history of hypertension,  paroxysmal atrial fibrillation,  hyperlipidemia and peripheral vascular disease. She had developed PAF and is on eliquis for anticoagulation and tolerating this well. She has noticed improvement in her claudication symptomatology since being on  anticoagulation.  She is maintaining sinus rhythm on sotalol 80 mg every 12 hours,  And metoprolol tartrate 50 mg twice a day.  She has intermittent leg swelling and has improved on Lasix every other day rather than every third until the swelling improves. A prior Holter monitor study did not reveal any recurrent AF.  She is intolerant to statin therapy.  She also stopped taking Zetia.  She continues to have difficulty with intolerance to medications.  She has noticed intermittent itching and she felt a Zetia was causing her to perspire.  He has had intermittent diarrhea.  With her remote abdominal surgery for rectal adenocarcinoma and prior abdominal perineal resection.  I have recommended she follow-up with her GI physician.  I have suggested over-the-counter Benadryl to see if this improves her itching or Atarax.  Repeat blood work will be obtained consisting of a cemented, TSH, magnesium, CBC with differential, and lipid studies. I will see her in one year for follow-up cardiology evaluation.    Time spent: 25 minutes  Troy Sine, MD, [facility]  06/13/2016 8:08 PM

## 2016-06-24 DIAGNOSIS — H5203 Hypermetropia, bilateral: Secondary | ICD-10-CM | POA: Diagnosis not present

## 2016-06-24 DIAGNOSIS — H2513 Age-related nuclear cataract, bilateral: Secondary | ICD-10-CM | POA: Diagnosis not present

## 2016-06-24 DIAGNOSIS — H3561 Retinal hemorrhage, right eye: Secondary | ICD-10-CM | POA: Diagnosis not present

## 2016-06-24 DIAGNOSIS — H25013 Cortical age-related cataract, bilateral: Secondary | ICD-10-CM | POA: Diagnosis not present

## 2016-06-25 ENCOUNTER — Telehealth: Payer: Self-pay | Admitting: *Deleted

## 2016-06-25 ENCOUNTER — Other Ambulatory Visit: Payer: Self-pay | Admitting: *Deleted

## 2016-06-25 DIAGNOSIS — Z79899 Other long term (current) drug therapy: Secondary | ICD-10-CM

## 2016-06-25 DIAGNOSIS — I10 Essential (primary) hypertension: Secondary | ICD-10-CM

## 2016-06-25 DIAGNOSIS — E7800 Pure hypercholesterolemia, unspecified: Secondary | ICD-10-CM

## 2016-06-25 MED ORDER — ROSUVASTATIN CALCIUM 5 MG PO TABS
5.0000 mg | ORAL_TABLET | ORAL | 3 refills | Status: DC
Start: 2016-06-25 — End: 2016-12-25

## 2016-06-25 NOTE — Telephone Encounter (Signed)
-----   Message from Troy Sine, MD sent at 06/22/2016 11:24 AM EDT ----- Lipids increased;  Consider re-challenge with crestor 5 mg qod;  Or consider re-trying zetia.

## 2016-06-25 NOTE — Telephone Encounter (Signed)
Patient notified of lab results and recommendations. She chose to try the crestor 5 mg. Prescription sent in to her mail order pharmacy.repeat labs ordered and slips mailed to patient to have blood drawn in 6-8 weeks after starting crestor.

## 2016-07-04 IMAGING — NM NM MISC PROCEDURE
6 series · 36 of 36 positions shown · non-contrast
Comparison: none

[Series 1: wbr rest · 6.40mm/px · 6 of 64 frames shown]
[frame 6/64]
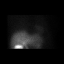
[frame 16/64]
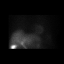
[frame 27/64]
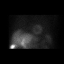
[frame 38/64]
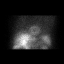
[frame 48/64]
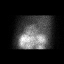
[frame 59/64]
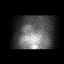

[Series 1: wbr_r-proj_st wbr rest · 6.40mm/px · 6 of 64 frames shown]
[frame 6/64]
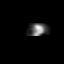
[frame 16/64]
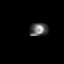
[frame 27/64]
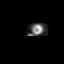
[frame 38/64]
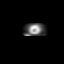
[frame 48/64]
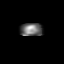
[frame 59/64]
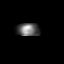

[Series 2: wbr stress-gsp · 6.40mm/px · 6 of 512 frames shown]
[frame 43/512]
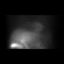
[frame 128/512]
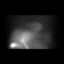
[frame 214/512]
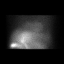
[frame 299/512]
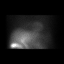
[frame 384/512]
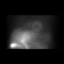
[frame 470/512]
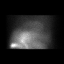

[Series 2: wbr_s-proj_st wbr stress-gsp · 6.40mm/px · 6 of 512 frames shown]
[frame 43/512]
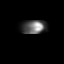
[frame 128/512]
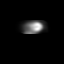
[frame 214/512]
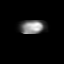
[frame 299/512]
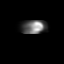
[frame 384/512]
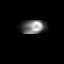
[frame 470/512]
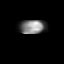

[Series 3: wbr stress-sum-em · 6.40mm/px · 6 of 64 frames shown]
[frame 6/64]
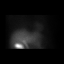
[frame 16/64]
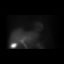
[frame 27/64]
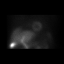
[frame 38/64]
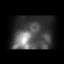
[frame 48/64]
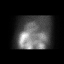
[frame 59/64]
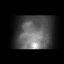

[Series 3: wbr_s-proj_st wbr stress-sum-em · 6.40mm/px · 6 of 64 frames shown]
[frame 6/64]
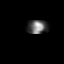
[frame 16/64]
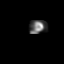
[frame 27/64]
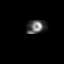
[frame 38/64]
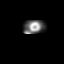
[frame 48/64]
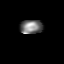
[frame 59/64]
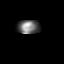

[36 of 36 positions shown; findings below may reference images not displayed]

Canned report from images found in remote index.

Refer to host system for actual result text.

## 2016-07-31 ENCOUNTER — Telehealth: Payer: Self-pay | Admitting: Cardiovascular Disease

## 2016-07-31 NOTE — Telephone Encounter (Signed)
New message  Pt is calling asking for samples for Eliquis 5mg   Pt is in donut hole  Please call and advise

## 2016-08-05 NOTE — Telephone Encounter (Signed)
Medication samples have been provided to the patient.  Drug name: Eliquis 5 mg Qty: 28 tabs LOT: [id number] Exp.Date: 10/2018  Samples left at front desk for patient pick-up. Patient notified.

## 2016-08-12 IMAGING — CR DG CHEST 1V PORT
1 series · 1 of 1 positions shown · non-contrast
Comparison: 03/09/2015

CLINICAL DATA: Atrial fibrillation, shortness of breath, fatigue.
Syncope. Symptoms for 1-2 days.

EXAM:
PORTABLE CHEST - 1 VIEW

[AP]
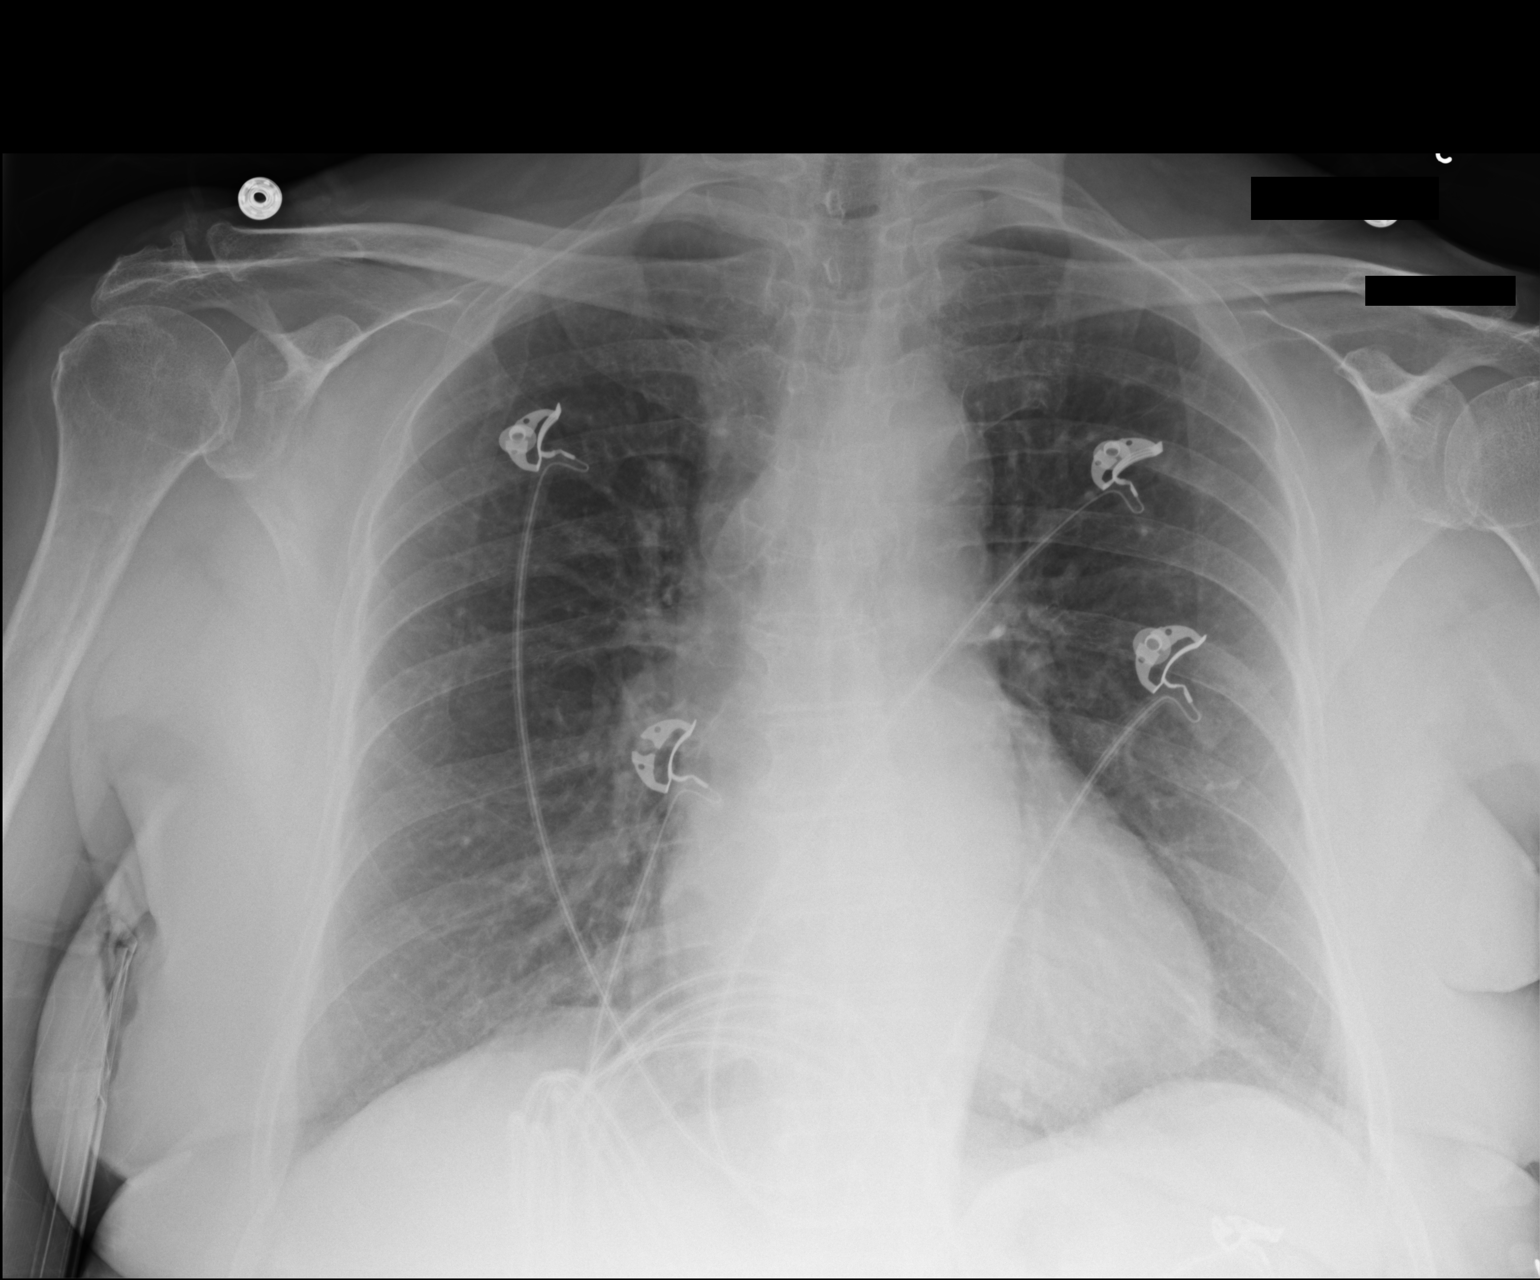

[1 of 1 positions shown; findings below may reference images not displayed]

FINDINGS: The heart size and mediastinal contours are within normal limits.
Both lungs are clear. The visualized skeletal structures are
unremarkable.
IMPRESSION: No active disease.

## 2016-08-28 DIAGNOSIS — Z79899 Other long term (current) drug therapy: Secondary | ICD-10-CM | POA: Diagnosis not present

## 2016-08-28 DIAGNOSIS — I10 Essential (primary) hypertension: Secondary | ICD-10-CM | POA: Diagnosis not present

## 2016-08-28 DIAGNOSIS — E7800 Pure hypercholesterolemia, unspecified: Secondary | ICD-10-CM | POA: Diagnosis not present

## 2016-08-28 LAB — LIPID PANEL
CHOLESTEROL: 187 mg/dL (ref <200)
HDL: 59 mg/dL (ref >50)
LDL Cholesterol: 86 mg/dL (ref <100)
Total CHOL/HDL Ratio: 3.2 Ratio (ref <5.0)
Triglycerides: 209 mg/dL — ABNORMAL HIGH (ref <150)
VLDL: 42 mg/dL — ABNORMAL HIGH (ref <30)

## 2016-08-28 LAB — COMPREHENSIVE METABOLIC PANEL
ALBUMIN: 4.3 g/dL (ref 3.6–5.1)
ALK PHOS: 60 U/L (ref 33–130)
ALT: 13 U/L (ref 6–29)
AST: 19 U/L (ref 10–35)
BILIRUBIN TOTAL: 1.1 mg/dL (ref 0.2–1.2)
BUN: 15 mg/dL (ref 7–25)
CO2: 29 mmol/L (ref 20–31)
CREATININE: 0.72 mg/dL (ref 0.60–0.88)
Calcium: 9.6 mg/dL (ref 8.6–10.4)
Chloride: 106 mmol/L (ref 98–110)
Glucose, Bld: 89 mg/dL (ref 65–99)
Potassium: 4.1 mmol/L (ref 3.5–5.3)
SODIUM: 142 mmol/L (ref 135–146)
TOTAL PROTEIN: 7 g/dL (ref 6.1–8.1)

## 2016-09-03 ENCOUNTER — Telehealth: Payer: Self-pay | Admitting: Cardiovascular Disease

## 2016-09-03 ENCOUNTER — Other Ambulatory Visit: Payer: Self-pay

## 2016-09-03 MED ORDER — METOPROLOL TARTRATE 50 MG PO TABS: 50.0000 mg | ORAL_TABLET | Freq: Two times a day (BID) | ORAL | 3 refills | Status: DC

## 2016-09-03 MED ORDER — SOTALOL HCL 80 MG PO TABS: 80.0000 mg | ORAL_TABLET | Freq: Two times a day (BID) | ORAL | 3 refills | Status: DC

## 2016-09-03 MED ORDER — RANITIDINE HCL 300 MG PO TABS: 300.0000 mg | ORAL_TABLET | Freq: Every morning | ORAL | 3 refills | Status: DC

## 2016-09-03 MED ORDER — POTASSIUM CHLORIDE ER 10 MEQ PO TBCR: EXTENDED_RELEASE_TABLET | ORAL | 3 refills | Status: DC

## 2016-09-03 MED ORDER — FUROSEMIDE 20 MG PO TABS: 20.0000 mg | ORAL_TABLET | ORAL | 3 refills | Status: DC

## 2016-09-03 MED ORDER — APIXABAN 5 MG PO TABS
5.0000 mg | ORAL_TABLET | Freq: Two times a day (BID) | ORAL | 3 refills | Status: DC
Start: 2016-09-03 — End: 2016-09-09

## 2016-09-03 NOTE — Telephone Encounter (Signed)
New message       *STAT* If patient is at the pharmacy, call can be transferred to refill team.   1. Which medications need to be refilled? (please list name of each medication and dose if known)   Generic eliquis 5mg , rinitidine 300mg , metoprolol 50mg , sotalol 80mg , furosemide 20mg , potassium 69meq 2. Which pharmacy/location (including street and city if local pharmacy) is medication to be sent to?  [facility] order 3. Do they need a 30 day or 90 day supply?  90 day supply

## 2016-09-04 ENCOUNTER — Telehealth: Payer: Self-pay | Admitting: *Deleted

## 2016-09-04 NOTE — Telephone Encounter (Signed)
Patient informed of lab results and recommendations. She voiced verbal understanding saying that she will do better now that the holidays are over with.

## 2016-09-04 NOTE — Telephone Encounter (Signed)
-----   Message from Troy Sine, MD sent at 09/03/2016  7:19 PM EST ----- Labs good x TG/VLDL elevated; decrease sweets/carbs

## 2016-09-09 ENCOUNTER — Other Ambulatory Visit: Payer: Self-pay | Admitting: Pharmacist

## 2016-09-09 MED ORDER — APIXABAN 5 MG PO TABS: 5.0000 mg | ORAL_TABLET | Freq: Two times a day (BID) | ORAL | 3 refills | Status: DC

## 2016-09-11 ENCOUNTER — Other Ambulatory Visit: Payer: Self-pay | Admitting: Internal Medicine

## 2016-09-11 DIAGNOSIS — Z1231 Encounter for screening mammogram for malignant neoplasm of breast: Secondary | ICD-10-CM

## 2016-09-24 ENCOUNTER — Ambulatory Visit: Payer: Commercial Managed Care - HMO

## 2016-10-06 ENCOUNTER — Ambulatory Visit
Admission: RE | Admit: 2016-10-06 | Discharge: 2016-10-06 | Disposition: A | Discharge status: Home / Self Care | Payer: Commercial Managed Care - HMO | Source: Ambulatory Visit | Attending: Internal Medicine | Admitting: Internal Medicine

## 2016-10-06 DIAGNOSIS — Z1231 Encounter for screening mammogram for malignant neoplasm of breast: Secondary | ICD-10-CM

## 2016-10-06 LAB — HM MAMMOGRAPHY

## 2016-11-27 ENCOUNTER — Ambulatory Visit (INDEPENDENT_AMBULATORY_CARE_PROVIDER_SITE_OTHER): Payer: Commercial Managed Care - HMO | Admitting: Internal Medicine

## 2016-11-27 ENCOUNTER — Other Ambulatory Visit (INDEPENDENT_AMBULATORY_CARE_PROVIDER_SITE_OTHER): Payer: Commercial Managed Care - HMO

## 2016-11-27 ENCOUNTER — Encounter: Payer: Self-pay | Admitting: Internal Medicine

## 2016-11-27 VITALS — BP 168/90 | HR 61 | Temp 98.1°F | Ht 66.0 in | Wt 198.2 lb

## 2016-11-27 DIAGNOSIS — G320 Subacute combined degeneration of spinal cord in diseases classified elsewhere: Secondary | ICD-10-CM | POA: Diagnosis not present

## 2016-11-27 DIAGNOSIS — G5793 Unspecified mononeuropathy of bilateral lower limbs: Secondary | ICD-10-CM | POA: Diagnosis not present

## 2016-11-27 DIAGNOSIS — E538 Deficiency of other specified B group vitamins: Secondary | ICD-10-CM

## 2016-11-27 DIAGNOSIS — I10 Essential (primary) hypertension: Secondary | ICD-10-CM

## 2016-11-27 DIAGNOSIS — E2839 Other primary ovarian failure: Secondary | ICD-10-CM | POA: Diagnosis not present

## 2016-11-27 DIAGNOSIS — M818 Other osteoporosis without current pathological fracture: Secondary | ICD-10-CM

## 2016-11-27 LAB — URINALYSIS, ROUTINE W REFLEX MICROSCOPIC
Bilirubin Urine: NEGATIVE
Hgb urine dipstick: NEGATIVE
Ketones, ur: NEGATIVE
Leukocytes, UA: NEGATIVE
Nitrite: NEGATIVE
SPECIFIC GRAVITY, URINE: 1.02 (ref 1.000–1.030)
Total Protein, Urine: NEGATIVE
URINE GLUCOSE: NEGATIVE
Urobilinogen, UA: 0.2 (ref 0.0–1.0)
pH: 6 (ref 5.0–8.0)

## 2016-11-27 LAB — CBC WITH DIFFERENTIAL/PLATELET
BASOS ABS: 0 10*3/uL (ref 0.0–0.1)
Basophils Relative: 0.6 % (ref 0.0–3.0)
EOS ABS: 0.1 10*3/uL (ref 0.0–0.7)
Eosinophils Relative: 1.8 % (ref 0.0–5.0)
HCT: 46.3 % — ABNORMAL HIGH (ref 36.0–46.0)
Hemoglobin: 15.8 g/dL — ABNORMAL HIGH (ref 12.0–15.0)
LYMPHS ABS: 1.8 10*3/uL (ref 0.7–4.0)
Lymphocytes Relative: 34.3 % (ref 12.0–46.0)
MCHC: 34.1 g/dL (ref 30.0–36.0)
MCV: 89.3 fl (ref 78.0–100.0)
MONO ABS: 0.6 10*3/uL (ref 0.1–1.0)
Monocytes Relative: 12.2 % — ABNORMAL HIGH (ref 3.0–12.0)
NEUTROS PCT: 51.1 % (ref 43.0–77.0)
Neutro Abs: 2.6 10*3/uL (ref 1.4–7.7)
PLATELETS: 175 10*3/uL (ref 150.0–400.0)
RBC: 5.19 Mil/uL — AB (ref 3.87–5.11)
RDW: 13.5 % (ref 11.5–15.5)
WBC: 5.1 10*3/uL (ref 4.0–10.5)

## 2016-11-27 LAB — COMPREHENSIVE METABOLIC PANEL
ALK PHOS: 64 U/L (ref 39–117)
ALT: 14 U/L (ref 0–35)
AST: 20 U/L (ref 0–37)
Albumin: 4.4 g/dL (ref 3.5–5.2)
BUN: 21 mg/dL (ref 6–23)
CO2: 29 mEq/L (ref 19–32)
CREATININE: 0.83 mg/dL (ref 0.40–1.20)
Calcium: 9.9 mg/dL (ref 8.4–10.5)
Chloride: 106 mEq/L (ref 96–112)
GFR: 69.96 mL/min (ref >60.00)
GLUCOSE: 103 mg/dL — AB (ref 70–99)
Potassium: 4.6 mEq/L (ref 3.5–5.1)
Sodium: 142 mEq/L (ref 135–145)
TOTAL PROTEIN: 7.3 g/dL (ref 6.0–8.3)
Total Bilirubin: 1 mg/dL (ref 0.2–1.2)

## 2016-11-27 LAB — FOLATE: Folate: >24 ng/mL (ref >5.9)

## 2016-11-27 LAB — VITAMIN D 25 HYDROXY (VIT D DEFICIENCY, FRACTURES): VITD: 31.76 ng/mL (ref 30.00–100.00)

## 2016-11-27 LAB — THYROID PANEL WITH TSH
Free Thyroxine Index: 2.6 (ref 1.4–3.8)
T3 UPTAKE: 31 % (ref 22–35)
T4 TOTAL: 8.5 ug/dL (ref 4.5–12.0)
TSH: 1.62 mIU/L

## 2016-11-27 LAB — VITAMIN B12: VITAMIN B 12: 287 pg/mL (ref 211–911)

## 2016-11-27 MED ORDER — PREGABALIN 50 MG PO CAPS: 50.0000 mg | ORAL_CAPSULE | Freq: Every day | ORAL | 0 refills | Status: DC

## 2016-11-27 MED ORDER — CHLORTHALIDONE 25 MG PO TABS: 25.0000 mg | ORAL_TABLET | Freq: Every day | ORAL | 1 refills | Status: DC

## 2016-11-27 NOTE — Progress Notes (Signed)
Pre visit review using our clinic review tool, if applicable. No additional management support is needed unless otherwise documented below in the visit note. 

## 2016-11-27 NOTE — Patient Instructions (Signed)

## 2016-11-27 NOTE — Progress Notes (Signed)
Subjective:  Patient ID: [name], female    DOB: 06-17-1933  Age: 83 y.o. MRN: [id number]  CC: Hypertension   HPI MISHELLE HASSAN presents for f/up - she complains that during her sleep she experiences burning pain in both feet with tingling and numbness. She has not taken anything for the pain. The sx's keep her awake at night. The sx's started about 1.5 years ago the same time she developed A fib and started taking sotalol. She has reported this to her cardiologist and was advised that she needs to continue taking sotalol.  Outpatient Medications Prior to Visit  Medication Sig Dispense Refill  . apixaban (ELIQUIS) 5 MG TABS tablet Take 1 tablet (5 mg total) by mouth 2 (two) times daily. 180 tablet 3  . Calcium Carbonate-Vitamin D (CALCIUM 600 + D PO) Take 2 tablets by mouth daily.    . metoprolol (LOPRESSOR) 50 MG tablet Take 1 tablet (50 mg total) by mouth 2 (two) times daily. 180 tablet 3  . potassium chloride (K-DUR) 10 MEQ tablet Take 1 tablet on the days that you take the furosemide. 90 tablet 3  . ranitidine (ZANTAC) 300 MG tablet Take 1 tablet (300 mg total) by mouth every morning. 90 tablet 3  . sotalol (BETAPACE) 80 MG tablet Take 1 tablet (80 mg total) by mouth every 12 (twelve) hours. 180 tablet 3  . furosemide (LASIX) 20 MG tablet Take 1 tablet (20 mg total) by mouth every other day. 45 tablet 3  . Multiple Vitamin (MULTIVITAMIN PO) Take 1 tablet by mouth daily.      . rosuvastatin (CRESTOR) 5 MG tablet Take 1 tablet (5 mg total) by mouth every other day. 90 tablet 3  . methylPREDNISolone acetate (DEPO-MEDROL) injection 40 mg      No facility-administered medications prior to visit.     ROS Review of Systems  Constitutional: Positive for fatigue. Negative for activity change, appetite change, chills, diaphoresis, fever and unexpected weight change.  HENT: Negative.  Negative for trouble swallowing.   Eyes: Negative for visual disturbance.  Respiratory: Negative  for cough, chest tightness, shortness of breath, wheezing and stridor.   Cardiovascular: Negative for chest pain, palpitations and leg swelling.  Gastrointestinal: Negative for abdominal pain, blood in stool, constipation, diarrhea, nausea and vomiting.  Endocrine: Negative for cold intolerance and heat intolerance.  Genitourinary: Negative.   Musculoskeletal: Negative.  Negative for back pain, myalgias and neck pain.  Skin: Negative.  Negative for color change and rash.  Allergic/Immunologic: Negative.   Neurological: Positive for numbness. Negative for dizziness, tremors, seizures, syncope, speech difficulty, weakness, light-headedness and headaches.  Hematological: Negative.  Negative for adenopathy. Does not bruise/bleed easily.  Psychiatric/Behavioral: Negative.     Objective:  BP (!) 168/90 (BP Location: Left Arm, Patient Position: Sitting, Cuff Size: Large) Comment: 180/110 Left  Pulse 61   Temp 98.1 F (36.7 C) (Oral)   Ht 5\' 6"  (1.676 m)   Wt 198 lb 4 oz (89.9 kg)   SpO2 97%   BMI 32.00 kg/m   BP Readings from Last 3 Encounters:  11/27/16 (!) 168/90  06/11/16 134/86  04/17/16 (!) 142/78    Wt Readings from Last 3 Encounters:  11/27/16 198 lb 4 oz (89.9 kg)  06/11/16 190 lb (86.2 kg)  04/17/16 189 lb (85.7 kg)    Physical Exam  Constitutional: She is oriented to person, place, and time. No distress.  HENT:  Mouth/Throat: Oropharynx is clear and moist. No oropharyngeal exudate.  Eyes: Conjunctivae are normal. Right eye exhibits no discharge. Left eye exhibits no discharge. No scleral icterus.  Neck: Normal range of motion. Neck supple. No JVD present. No tracheal deviation present. No thyromegaly present.  Cardiovascular: Normal rate, regular rhythm, normal heart sounds and intact distal pulses.  Exam reveals no gallop and no friction rub.   No murmur heard. Pulmonary/Chest: Effort normal and breath sounds normal. No stridor. No respiratory distress. She has no  wheezes. She has no rales. She exhibits no tenderness.  Abdominal: Soft. Bowel sounds are normal. She exhibits no distension and no mass. There is no tenderness. There is no rebound and no guarding.  Musculoskeletal: Normal range of motion. She exhibits no edema, tenderness or deformity.  Lymphadenopathy:    She has no cervical adenopathy.  Neurological: She is alert and oriented to person, place, and time. She has normal reflexes. She displays normal reflexes. No cranial nerve deficit. She exhibits normal muscle tone. Coordination normal.  Skin: Skin is warm and dry. No rash noted. She is not diaphoretic. No erythema. No pallor.  Psychiatric: She has a normal mood and affect. Her behavior is normal. Judgment and thought content normal.  Vitals reviewed.   Lab Results  Component Value Date   WBC 5.1 11/27/2016   HGB 15.8 (H) 11/27/2016   HCT 46.3 (H) 11/27/2016   PLT 175.0 11/27/2016   GLUCOSE 103 (H) 11/27/2016   CHOL 187 08/28/2016   TRIG 209 (H) 08/28/2016   HDL 59 08/28/2016   LDLDIRECT 152.3 02/10/2012   LDLCALC 86 08/28/2016   ALT 14 11/27/2016   AST 20 11/27/2016   NA 142 11/27/2016   K 4.6 11/27/2016   CL 106 11/27/2016   CREATININE 0.83 11/27/2016   BUN 21 11/27/2016   CO2 29 11/27/2016   TSH 1.62 11/27/2016   INR 1.32 04/18/2015   HGBA1C  05/15/2007    5.8 (NOTE)   The ADA recommends the following therapeutic goals for glycemic   control related to Hgb A1C measurement:   Goal of Therapy:   < 7.0% Hgb A1C   Action Suggested:  > 8.0% Hgb A1C   Ref:  Diabetes Care, 22, Suppl. 1, 1999    Mm Digital Screening Bilateral  Result Date: 10/06/2016 CLINICAL DATA:  Screening. EXAM: DIGITAL SCREENING BILATERAL MAMMOGRAM WITH CAD COMPARISON:  Previous exam(s). ACR Breast Density Category a: The breast tissue is almost entirely fatty. FINDINGS: There are no findings suspicious for malignancy. Images were processed with CAD. IMPRESSION: No mammographic evidence of malignancy. A  result letter of this screening mammogram will be mailed directly to the patient. RECOMMENDATION: Screening mammogram in one year. (Code:SM-B-01Y) BI-RADS CATEGORY  1: Negative. Electronically Signed   By: Margarette Canada M.D.   On: 10/06/2016 14:25    Assessment & Plan:   Geneive was seen today for hypertension.  Diagnoses and all orders for this visit:  Essential hypertension- her blood pressure is not adequately well controlled, I think a thiazide diuretic would be a more effective antihypertensive than a loop diuretic so I have asked her to stop taking furosemide and to start chlorthalidone. Her electrolytes and renal function are normal today. -     Discontinue: chlorthalidone (HYGROTON) 25 MG tablet; Take 1 tablet (25 mg total) by mouth daily. -     Comprehensive metabolic panel; Future -     CBC with Differential/Platelet; Future -     Thyroid Panel With TSH; Future -     Urinalysis, Routine w reflex  microscopic; Future -     chlorthalidone (HYGROTON) 25 MG tablet; Take 1 tablet (25 mg total) by mouth daily.  Other osteoporosis without current pathological fracture -     DG Bone Density; Future -     VITAMIN D 25 Hydroxy (Vit-D Deficiency, Fractures); Future  Estrogen deficiency- she is due for a DEXA scan -     DG Bone Density; Future  Neuropathy involving both lower extremities- There are rare reports of sotalol causing peripheral neuropathy, I told her it's incumbent on her to discuss this with her cardiologist to see if she can stop sotalol and change to something else. In the meantime will try to treat the symptoms with Lyrica starting with 50 mg at bedtime, I did tell her we can increase the dose as needed. She also has a borderline low B12 level so will offer her B12 replacement therapy to see if this helps with her symptoms. -     Ambulatory referral to Neurology -     Vitamin B12; Future -     Folate; Future -     pregabalin (LYRICA) 50 MG capsule; Take 1 capsule (50 mg total)  by mouth at bedtime. -     cyanocobalamin 2000 MCG tablet; Take 1 tablet (2,000 mcg total) by mouth daily.  Neuromyelopathy due to vitamin B12 deficiency ([place]) -     cyanocobalamin 2000 MCG tablet; Take 1 tablet (2,000 mcg total) by mouth daily.   I have discontinued Ms. Glace's Multiple Vitamin (MULTIVITAMIN PO) and furosemide. I am also having her start on pregabalin and cyanocobalamin. Additionally, I am having her maintain her Calcium Carbonate-Vitamin D (CALCIUM 600 + D PO), rosuvastatin, metoprolol, potassium chloride, ranitidine, sotalol, apixaban, and chlorthalidone. We will stop administering methylPREDNISolone acetate.  Meds ordered this encounter  Medications  . DISCONTD: chlorthalidone (HYGROTON) 25 MG tablet    Sig: Take 1 tablet (25 mg total) by mouth daily.    Dispense:  90 tablet    Refill:  1  . pregabalin (LYRICA) 50 MG capsule    Sig: Take 1 capsule (50 mg total) by mouth at bedtime.    Dispense:  126 capsule    Refill:  0  . chlorthalidone (HYGROTON) 25 MG tablet    Sig: Take 1 tablet (25 mg total) by mouth daily.    Dispense:  90 tablet    Refill:  1  . cyanocobalamin 2000 MCG tablet    Sig: Take 1 tablet (2,000 mcg total) by mouth daily.    Dispense:  90 tablet    Refill:  3     Follow-up: Return in about 4 weeks (around 12/25/2016).  [name], MD

## 2016-12-01 DIAGNOSIS — E538 Deficiency of other specified B group vitamins: Secondary | ICD-10-CM | POA: Insufficient documentation

## 2016-12-01 DIAGNOSIS — G320 Subacute combined degeneration of spinal cord in diseases classified elsewhere: Secondary | ICD-10-CM

## 2016-12-01 MED ORDER — CYANOCOBALAMIN 2000 MCG PO TABS: 2000.0000 ug | ORAL_TABLET | Freq: Every day | ORAL | 3 refills | Status: DC

## 2016-12-02 ENCOUNTER — Encounter: Payer: Self-pay | Admitting: Internal Medicine

## 2016-12-18 ENCOUNTER — Ambulatory Visit (INDEPENDENT_AMBULATORY_CARE_PROVIDER_SITE_OTHER): Payer: Medicare HMO | Admitting: Neurology

## 2016-12-18 ENCOUNTER — Encounter: Payer: Self-pay | Admitting: Neurology

## 2016-12-18 DIAGNOSIS — R799 Abnormal finding of blood chemistry, unspecified: Secondary | ICD-10-CM | POA: Diagnosis not present

## 2016-12-18 DIAGNOSIS — R7309 Other abnormal glucose: Secondary | ICD-10-CM

## 2016-12-18 DIAGNOSIS — R202 Paresthesia of skin: Secondary | ICD-10-CM

## 2016-12-18 DIAGNOSIS — E559 Vitamin D deficiency, unspecified: Secondary | ICD-10-CM | POA: Diagnosis not present

## 2016-12-18 DIAGNOSIS — R2689 Other abnormalities of gait and mobility: Secondary | ICD-10-CM | POA: Diagnosis not present

## 2016-12-18 DIAGNOSIS — G603 Idiopathic progressive neuropathy: Secondary | ICD-10-CM | POA: Diagnosis not present

## 2016-12-18 MED ORDER — GABAPENTIN 300 MG PO CAPS: 300.0000 mg | ORAL_CAPSULE | Freq: Three times a day (TID) | ORAL | 11 refills | Status: DC

## 2016-12-18 NOTE — Progress Notes (Signed)
PATIENT: [name] DOB: 1932-11-19  Chief Complaint  Patient presents with  . Peripheral Neuropathy    Reports burning, tingling pain in her bilateral feet.  Symptoms have been steadily worsening over the last two years.  The pain often wakes her from sleep. She has noticed some improvement with Lyrica 50mg  at bedtime.  Marland Kitchen PCP    Janith Lima, MD     HISTORICAL  [name] is 83 years old right-handed female, seen in refer by her primary care Dr. [name] for evaluation of peripheral neuropathy, initial evaluation was on December 18 2016.  I reviewed and summarized the referring note, she had a history of hypertension, hyperlipidemia, atrial fibrillation, on chronic Eliquis treatment, also had a history of colon cancer, require colectomy, but did not receive chemotherapy radiation therapy in 2008  In 2016, she had a flareup of her atrial fibrillation, with rapid ventricular responding rate, during that period of time, she was treated with different medications, require shock treatment, then she noticed arm of her feet and her toes tingling, numbness sensation, over the years, her symptoms persistent, especially at nighttime, she has burning sensation at the bottom of the feet, below her ankle, the urge to move,  There was a diagnosis of iron deficiency anemia in the chart, most recent hemoglobin was 15 point 8, within normal limits. She was giving a sample of Lyrica 50 mg every night which has been very helpful,  She also complains of diffuse muscle achy pain since she was treated with statin, currently she is taking low-dose Crestor 5 mg daily  I was able to review laboratory evaluation, normal CMP with exception of mild elevated glucose 103, hemoglobin was 15 point 8, B12 was 297, normal folic acid more than 24, thyroid functional test, negative UA  REVIEW OF SYSTEMS: Full 14 system review of systems performed and notable only for shortness of breath, fatigue, cough,  hearing loss, ringing ears, joint pain, achy muscles  ALLERGIES: Allergies  Allergen Reactions  . Acetaminophen-Codeine   . Amlodipine Besylate     REACTION: swelling  . Diltiazem Hcl     REACTION: rash  . Hydrocodone-Acetaminophen     nausea  . Irbesartan-Hydrochlorothiazide     REACTION: Dizziness  . Lisinopril     cough  . Omeprazole Nausea Only and Other (See Comments)    Dizziness, joint pain, weakness in legs, cramps in legs, stomach burned  . Propoxyphene N-Acetaminophen     Headache, and blotchy face  . Valsartan     REACTION: blurred vision  . Warfarin Sodium     REACTION: body aches and bleeding  . Verapamil Nausea Only, Palpitations and Other (See Comments)    Headaches     HOME MEDICATIONS: Current Outpatient Prescriptions  Medication Sig Dispense Refill  . apixaban (ELIQUIS) 5 MG TABS tablet Take 1 tablet (5 mg total) by mouth 2 (two) times daily. 180 tablet 3  . Calcium Carbonate-Vitamin D (CALCIUM 600 + D PO) Take 2 tablets by mouth daily.    . chlorthalidone (HYGROTON) 25 MG tablet Take 1 tablet (25 mg total) by mouth daily. 90 tablet 1  . cyanocobalamin 2000 MCG tablet Take 1 tablet (2,000 mcg total) by mouth daily. 90 tablet 3  . metoprolol (LOPRESSOR) 50 MG tablet Take 1 tablet (50 mg total) by mouth 2 (two) times daily. 180 tablet 3  . potassium chloride (K-DUR) 10 MEQ tablet Take 1 tablet on the days that you take the  furosemide. 90 tablet 3  . pregabalin (LYRICA) 50 MG capsule Take 1 capsule (50 mg total) by mouth at bedtime. 126 capsule 0  . ranitidine (ZANTAC) 300 MG tablet Take 1 tablet (300 mg total) by mouth every morning. 90 tablet 3  . sotalol (BETAPACE) 80 MG tablet Take 1 tablet (80 mg total) by mouth every 12 (twelve) hours. 180 tablet 3  . rosuvastatin (CRESTOR) 5 MG tablet Take 1 tablet (5 mg total) by mouth every other day. 90 tablet 3   No current facility-administered medications for this visit.     PAST MEDICAL HISTORY: Past Medical  History:  Diagnosis Date  . Abnormal nuclear stress test    mild anterolateral septal and inferior ischemia  . Arthritis   . Atherosclerosis of lower extremity with claudication ([place]) 04/17/2015   LEFT LEG  . Atrial fibrillation ([place])   . Claudication ([place])   . Colon cancer ([place])   . Coronary atherosclerosis of unspecified type of vessel, native or graft   . Esophageal stricture   . Fatty liver 11/05/11  . Hiatal hernia   . HLD (hyperlipidemia)   . Hypertension   . Iron deficiency anemia, unspecified   . Ischemic colitis ([place])   . Neuropathy   . Osteoporosis   . PAF (paroxysmal atrial fibrillation) ([place]) 04/2015  . Unspecified vascular insufficiency of intestine     PAST SURGICAL HISTORY: Past Surgical History:  Procedure Laterality Date  . abdominoperineal resection anastomotic stricture  05/2007  . APPENDECTOMY    . CARDIAC CATHETERIZATION N/A 03/13/2015   Procedure: Left Heart Cath and Coronary Angiography;  Surgeon: Troy Sine, MD;  Location: [facility];  Service: Cardiovascular;  Laterality: N/A;  . CARDIOVERSION N/A 04/20/2015   Procedure: CARDIOVERSION;  Surgeon: Jerline Pain, MD;  Location: [facility];  Service: Cardiovascular;  Laterality: N/A;  . [place]  . ESOPHAGOGASTRODUODENOSCOPY N/A 04/27/2014   Procedure: ESOPHAGOGASTRODUODENOSCOPY (EGD);  Surgeon: Lafayette Dragon, MD;  Location: [facility];  Service: Endoscopy;  Laterality: N/A;  . Mandibular Renstruction    . ROTATOR CUFF REPAIR  2006   left  . Rt. Salpingo oophorectomy and cyst removal  2005  . SAVORY DILATION N/A 04/27/2014   Procedure: SAVORY DILATION;  Surgeon: Lafayette Dragon, MD;  Location: [facility];  Service: Endoscopy;  Laterality: N/A;  no xray needed  . sigmoid resection for invasive rectal adenocarcinoma  12/2006  . TEE WITHOUT CARDIOVERSION N/A 04/20/2015   Procedure: TRANSESOPHAGEAL ECHOCARDIOGRAM (TEE);  Surgeon: Jerline Pain, MD;  Location: [facility];  Service: Cardiovascular;  Laterality: N/A;  . TUBAL LIGATION    . WRIST SURGERY  2008   right    FAMILY HISTORY: Family History  Problem Relation Age of Onset  . Colon cancer Father 67  . Heart disease Father   . Hypertension Father   . Alzheimer's disease Mother   . Thyroid disease Mother   . Hypertension Sister   . Thyroid disease Sister   . Mitral valve prolapse Sister   . Hypertension Son   . Diabetes Son   . Hypertension Son   . Diabetes Son   . Esophageal cancer Neg Hx   . Rectal cancer Neg Hx   . Stomach cancer Neg Hx     SOCIAL HISTORY:  Social History   Social History  . Marital status: Widowed    Spouse name: N/A  . Number of children: 4  . Years of education:  12th grade   Occupational History  . retired Retired   Social History Main Topics  . Smoking status: Never Smoker  . Smokeless tobacco: Never Used  . Alcohol use No  . Drug use: No  . Sexual activity: Not Currently   Other Topics Concern  . Not on file   Social History Narrative   Lives at home with fiance.   Right-handed.   No caffeine use.     PHYSICAL EXAM   Vitals:   12/18/16 0952  BP: (!) 144/82  Pulse: 60  Weight: 196 lb 12 oz (89.2 kg)  Height: 5\' 6"  (1.676 m)    Not recorded      Body mass index is 31.76 kg/m.  PHYSICAL EXAMNIATION:  Gen: NAD, conversant, well nourised, obese, well groomed                     Cardiovascular: Regular rate rhythm, no peripheral edema, warm, nontender. Eyes: Conjunctivae clear without exudates or hemorrhage Neck: Supple, no carotid bruits. Pulmonary: Clear to auscultation bilaterally   NEUROLOGICAL EXAM:  MENTAL STATUS: Speech:    Speech is normal; fluent and spontaneous with normal comprehension.  Cognition:     Orientation to time, place and person     Normal recent and remote memory     Normal Attention span and concentration     Normal Language, naming, repeating,spontaneous speech     Fund of knowledge     CRANIAL NERVES: CN II: Visual fields are full to confrontation. Fundoscopic exam is normal with sharp discs and no vascular changes. Pupils are round equal and briskly reactive to light. CN III, IV, VI: extraocular movement are normal. No ptosis. CN V: Facial sensation is intact to pinprick in all 3 divisions bilaterally. Corneal responses are intact.  CN VII: Face is symmetric with normal eye closure and smile. CN VIII: Hearing is normal to rubbing fingers CN IX, X: Palate elevates symmetrically. Phonation is normal. CN XI: Head turning and shoulder shrug are intact CN XII: Tongue is midline with normal movements and no atrophy.  MOTOR: There is no pronator drift of out-stretched arms. Muscle bulk and tone are normal. Muscle strength is normal.  REFLEXES: Reflexes are 2+ and symmetric at the biceps, triceps, knees, and ankles. Plantar responses are flexor.  SENSORY: Intact to light touch, pinprick, positional sensation and vibratory sensation are intact in fingers and toes.  COORDINATION: Rapid alternating movements and fine finger movements are intact. There is no dysmetria on finger-to-nose and heel-knee-shin.    GAIT/STANCE: Posture is normal. Gait is steady with normal steps, base, arm swing, and turning. Heel and toe walking are normal. Tandem gait is normal.  Romberg is absent.   DIAGNOSTIC DATA (LABS, IMAGING, TESTING) - I reviewed patient records, labs, notes, testing and imaging myself where available.   ASSESSMENT AND PLAN  [name] is a 83 y.o. female   Bilateral feet paresthesia,  Most suggestive of small fiber neuropathy, Diffuse muscle achy pain  Patient contributed to Crestor use,  Laboratory evaluations,  EMG nerve conduction study,  She was given samples of Lyrica 50 mg, responding well, will try gabapentin 300 mg up to 3 tablets a day  Marcial Pacas, M.D. Ph.D.  [facility] [address] [place], [place] [address] Ph:  [phone] Fax: [phone]  CC: Janith Lima, MD

## 2016-12-22 LAB — IRON AND TIBC
IRON SATURATION: 31 % (ref 15–55)
IRON: 86 ug/dL (ref 27–139)
TIBC: 275 ug/dL (ref 250–450)
UIBC: 189 ug/dL (ref 118–369)

## 2016-12-22 LAB — HGB A1C W/O EAG: Hgb A1c MFr Bld: 5.5 % (ref 4.8–5.6)

## 2016-12-22 LAB — VITAMIN D 25 HYDROXY (VIT D DEFICIENCY, FRACTURES): Vit D, 25-Hydroxy: 31.4 ng/mL (ref 30.0–100.0)

## 2016-12-22 LAB — FERRITIN: FERRITIN: 63 ng/mL (ref 15–150)

## 2016-12-22 LAB — RPR: RPR Ser Ql: NONREACTIVE

## 2016-12-22 LAB — C-REACTIVE PROTEIN: CRP: 2.1 mg/L (ref 0.0–4.9)

## 2016-12-22 LAB — IMMUNOFIXATION ELECTROPHORESIS
IGA/IMMUNOGLOBULIN A, SERUM: 113 mg/dL (ref 64–422)
IGM (IMMUNOGLOBULIN M), SRM: 73 mg/dL (ref 26–217)
IgG (Immunoglobin G), Serum: 1051 mg/dL (ref 700–1600)
TOTAL PROTEIN: 7.1 g/dL (ref 6.0–8.5)

## 2016-12-22 LAB — ANA W/REFLEX IF POSITIVE: Anti Nuclear Antibody(ANA): NEGATIVE

## 2016-12-22 LAB — ANGIOTENSIN CONVERTING ENZYME: Angio Convert Enzyme: 37 U/L (ref 14–82)

## 2016-12-22 LAB — COPPER, SERUM: Copper: 106 ug/dL (ref 72–166)

## 2016-12-22 LAB — B. BURGDORFI ANTIBODIES: Lyme IgG/IgM Ab: <0.91 {ISR} (ref 0.00–0.90)

## 2016-12-22 LAB — CK: Total CK: 93 U/L (ref 24–173)

## 2016-12-22 LAB — SEDIMENTATION RATE: Sed Rate: 2 mm/hr (ref 0–40)

## 2016-12-25 ENCOUNTER — Ambulatory Visit (INDEPENDENT_AMBULATORY_CARE_PROVIDER_SITE_OTHER)
Admission: RE | Admit: 2016-12-25 | Discharge: 2016-12-25 | Disposition: A | Discharge status: Home / Self Care | Payer: Commercial Managed Care - HMO | Source: Ambulatory Visit | Attending: Internal Medicine | Admitting: Internal Medicine

## 2016-12-25 ENCOUNTER — Encounter: Payer: Self-pay | Admitting: Internal Medicine

## 2016-12-25 ENCOUNTER — Ambulatory Visit (INDEPENDENT_AMBULATORY_CARE_PROVIDER_SITE_OTHER): Payer: Commercial Managed Care - HMO | Admitting: Internal Medicine

## 2016-12-25 VITALS — BP 138/78 | HR 58 | Temp 98.1°F | Resp 16 | Ht 66.0 in | Wt 199.0 lb

## 2016-12-25 DIAGNOSIS — E2839 Other primary ovarian failure: Secondary | ICD-10-CM | POA: Diagnosis not present

## 2016-12-25 DIAGNOSIS — M818 Other osteoporosis without current pathological fracture: Secondary | ICD-10-CM | POA: Diagnosis not present

## 2016-12-25 DIAGNOSIS — I4891 Unspecified atrial fibrillation: Secondary | ICD-10-CM

## 2016-12-25 DIAGNOSIS — E785 Hyperlipidemia, unspecified: Secondary | ICD-10-CM

## 2016-12-25 DIAGNOSIS — Z0000 Encounter for general adult medical examination without abnormal findings: Secondary | ICD-10-CM | POA: Diagnosis not present

## 2016-12-25 DIAGNOSIS — I10 Essential (primary) hypertension: Secondary | ICD-10-CM

## 2016-12-25 MED ORDER — METOPROLOL TARTRATE 50 MG PO TABS: 12.5000 mg | ORAL_TABLET | Freq: Two times a day (BID) | ORAL | 3 refills | Status: DC

## 2016-12-25 NOTE — Progress Notes (Signed)
Subjective:  Patient ID: [name], female    DOB: Oct 31, 1932  Age: 83 y.o. MRN: [id number]  CC: Annual Exam; Hypertension; and Hyperlipidemia   HPI [name] presents for a CPX.  She wants to stop taking the statin due to muscle and joint aches. She wants to lower the dose of metoprolol due to severe fatigue and low heart rate.  Past Medical History:  Diagnosis Date  . Abnormal nuclear stress test    mild anterolateral septal and inferior ischemia  . Arthritis   . Atherosclerosis of lower extremity with claudication ([place]) 04/17/2015   LEFT LEG  . Atrial fibrillation ([place])   . Claudication ([place])   . Colon cancer ([place])   . Coronary atherosclerosis of unspecified type of vessel, native or graft   . Esophageal stricture   . Fatty liver 11/05/11  . Hiatal hernia   . HLD (hyperlipidemia)   . Hypertension   . Iron deficiency anemia, unspecified   . Ischemic colitis ([place])   . Neuropathy   . Osteoporosis   . PAF (paroxysmal atrial fibrillation) ([place]) 04/2015  . Unspecified vascular insufficiency of intestine    Past Surgical History:  Procedure Laterality Date  . abdominoperineal resection anastomotic stricture  05/2007  . APPENDECTOMY    . CARDIAC CATHETERIZATION N/A 03/13/2015   Procedure: Left Heart Cath and Coronary Angiography;  Surgeon: Troy Sine, MD;  Location: [facility];  Service: Cardiovascular;  Laterality: N/A;  . CARDIOVERSION N/A 04/20/2015   Procedure: CARDIOVERSION;  Surgeon: Jerline Pain, MD;  Location: [facility];  Service: Cardiovascular;  Laterality: N/A;  . [place]  . ESOPHAGOGASTRODUODENOSCOPY N/A 04/27/2014   Procedure: ESOPHAGOGASTRODUODENOSCOPY (EGD);  Surgeon: Lafayette Dragon, MD;  Location: [facility];  Service: Endoscopy;  Laterality: N/A;  . Mandibular Renstruction    . ROTATOR CUFF REPAIR  2006   left  . Rt. Salpingo oophorectomy and cyst removal  2005  . SAVORY DILATION N/A 04/27/2014   Procedure: SAVORY DILATION;  Surgeon: Lafayette Dragon, MD;  Location: [facility];  Service: Endoscopy;  Laterality: N/A;  no xray needed  . sigmoid resection for invasive rectal adenocarcinoma  12/2006  . TEE WITHOUT CARDIOVERSION N/A 04/20/2015   Procedure: TRANSESOPHAGEAL ECHOCARDIOGRAM (TEE);  Surgeon: Jerline Pain, MD;  Location: [facility];  Service: Cardiovascular;  Laterality: N/A;  . TUBAL LIGATION    . WRIST SURGERY  2008   right    reports that she has never smoked. She has never used smokeless tobacco. She reports that she does not drink alcohol or use drugs. family history includes Alzheimer's disease in her mother; Colon cancer (age of onset: 46) in her father; Diabetes in her son and son; Heart disease in her father; Hypertension in her father, sister, son, and son; Mitral valve prolapse in her sister; Thyroid disease in her mother and sister. Allergies  Allergen Reactions  . Crestor [Rosuvastatin Calcium] Other (See Comments)    Muscle aches  . Acetaminophen-Codeine   . Amlodipine Besylate     REACTION: swelling  . Diltiazem Hcl     REACTION: rash  . Hydrocodone-Acetaminophen     nausea  . Irbesartan-Hydrochlorothiazide     REACTION: Dizziness  . Lisinopril     cough  . Omeprazole Nausea Only and Other (See Comments)    Dizziness, joint pain, weakness in legs, cramps in legs, stomach burned  . Propoxyphene N-Acetaminophen     Headache, and blotchy face  .  Valsartan     REACTION: blurred vision  . Warfarin Sodium     REACTION: body aches and bleeding  . Verapamil Nausea Only, Palpitations and Other (See Comments)    Headaches     Outpatient Medications Prior to Visit  Medication Sig Dispense Refill  . apixaban (ELIQUIS) 5 MG TABS tablet Take 1 tablet (5 mg total) by mouth 2 (two) times daily. 180 tablet 3  . Calcium Carbonate-Vitamin D (CALCIUM 600 + D PO) Take 2 tablets by mouth daily.    . chlorthalidone (HYGROTON) 25 MG tablet Take 1 tablet (25 mg total) by  mouth daily. 90 tablet 1  . cyanocobalamin 2000 MCG tablet Take 1 tablet (2,000 mcg total) by mouth daily. 90 tablet 3  . potassium chloride (K-DUR) 10 MEQ tablet Take 1 tablet on the days that you take the furosemide. 90 tablet 3  . pregabalin (LYRICA) 50 MG capsule Take 1 capsule (50 mg total) by mouth at bedtime. 126 capsule 0  . ranitidine (ZANTAC) 300 MG tablet Take 1 tablet (300 mg total) by mouth every morning. 90 tablet 3  . sotalol (BETAPACE) 80 MG tablet Take 1 tablet (80 mg total) by mouth every 12 (twelve) hours. 180 tablet 3  . metoprolol (LOPRESSOR) 50 MG tablet Take 1 tablet (50 mg total) by mouth 2 (two) times daily. 180 tablet 3  . gabapentin (NEURONTIN) 300 MG capsule Take 1 capsule (300 mg total) by mouth 3 (three) times daily. (Patient not taking: Reported on 12/25/2016) 90 capsule 11  . rosuvastatin (CRESTOR) 5 MG tablet Take 1 tablet (5 mg total) by mouth every other day. 90 tablet 3   No facility-administered medications prior to visit.     ROS Review of Systems  Constitutional: Positive for fatigue. Negative for activity change, appetite change, chills, diaphoresis, fever and unexpected weight change.  HENT: Negative.   Eyes: Negative.   Respiratory: Negative for cough, chest tightness, shortness of breath and wheezing.   Cardiovascular: Negative for chest pain, palpitations and leg swelling.  Gastrointestinal: Negative for abdominal pain, constipation, diarrhea, nausea and vomiting.  Endocrine: Negative.  Negative for cold intolerance, heat intolerance, polydipsia and polyphagia.  Genitourinary: Negative.  Negative for decreased urine volume, difficulty urinating, dysuria, flank pain and urgency.  Musculoskeletal: Positive for arthralgias and myalgias. Negative for back pain, gait problem and neck pain.  Skin: Negative.   Allergic/Immunologic: Negative.   Neurological: Negative.  Negative for dizziness, syncope, weakness and light-headedness.  Hematological:  Negative for adenopathy. Does not bruise/bleed easily.  Psychiatric/Behavioral: Negative.     Objective:  BP 138/78 (BP Location: Left Arm, Patient Position: Sitting, Cuff Size: Normal)   Pulse (!) 58   Temp 98.1 F (36.7 C) (Oral)   Resp 16   Ht 5\' 6"  (1.676 m)   Wt 199 lb (90.3 kg)   SpO2 96%   BMI 32.12 kg/m   BP Readings from Last 3 Encounters:  12/25/16 138/78  12/18/16 (!) 144/82  11/27/16 (!) 168/90    Wt Readings from Last 3 Encounters:  12/25/16 199 lb (90.3 kg)  12/18/16 196 lb 12 oz (89.2 kg)  11/27/16 198 lb 4 oz (89.9 kg)    Physical Exam  Constitutional: She is oriented to person, place, and time. No distress.  HENT:  Head: Normocephalic and atraumatic.  Mouth/Throat: Oropharynx is clear and moist. No oropharyngeal exudate.  Eyes: Conjunctivae are normal. Right eye exhibits no discharge. Left eye exhibits no discharge. No scleral icterus.  Neck: Normal range  of motion. Neck supple. No JVD present. No tracheal deviation present. No thyromegaly present.  Cardiovascular: Normal rate, regular rhythm, normal heart sounds and intact distal pulses.  Exam reveals no gallop and no friction rub.   No murmur heard. Pulmonary/Chest: Effort normal and breath sounds normal. No stridor. No respiratory distress. She has no wheezes. She has no rales. She exhibits no tenderness.  Abdominal: Soft. Bowel sounds are normal. She exhibits no distension and no mass. There is no tenderness. There is no rebound and no guarding.  Musculoskeletal: Normal range of motion. She exhibits no edema, tenderness or deformity.  Lymphadenopathy:    She has no cervical adenopathy.  Neurological: She is oriented to person, place, and time.  Skin: Skin is warm and dry. No rash noted. She is not diaphoretic. No erythema.  Vitals reviewed.   Lab Results  Component Value Date   WBC 5.1 11/27/2016   HGB 15.8 (H) 11/27/2016   HCT 46.3 (H) 11/27/2016   PLT 175.0 11/27/2016   GLUCOSE 103 (H)  11/27/2016   CHOL 187 08/28/2016   TRIG 209 (H) 08/28/2016   HDL 59 08/28/2016   LDLDIRECT 152.3 02/10/2012   LDLCALC 86 08/28/2016   ALT 14 11/27/2016   AST 20 11/27/2016   NA 142 11/27/2016   K 4.6 11/27/2016   CL 106 11/27/2016   CREATININE 0.83 11/27/2016   BUN 21 11/27/2016   CO2 29 11/27/2016   TSH 1.62 11/27/2016   INR 1.32 04/18/2015   HGBA1C 5.5 12/18/2016    No results found.  Assessment & Plan:   [name] was seen today for annual exam, hypertension and hyperlipidemia.  Diagnoses and all orders for this visit:  Essential hypertension- Her blood pressure is adequately well controlled, recent electrolytes and renal function were normal. -     metoprolol (LOPRESSOR) 50 MG tablet; Take 0.5 tablets (25 mg total) by mouth 2 (two) times daily.  Atrial fibrillation with rapid ventricular response (HCC)- will decrease her metoprolol dose to 25 mg twice a day to see if this helps improve her symptoms but continues to maintain rate and rhythm control -     metoprolol (LOPRESSOR) 50 MG tablet; Take 0.5 tablets (25 mg total) by mouth 2 (two) times daily.  Dyslipidemia, goal LDL below 100- at her request, she will stop taking the statin. I've asked her to return in about 2 months for me to recheck her LDL and reconsider whether or not a different statin should be tried.  Routine general medical examination at a health care facility   I have discontinued Ms. Dawood's rosuvastatin. I have also changed her metoprolol. Additionally, I am having her maintain her Calcium Carbonate-Vitamin D (CALCIUM 600 + D PO), potassium chloride, ranitidine, sotalol, apixaban, pregabalin, chlorthalidone, cyanocobalamin, and gabapentin.  Meds ordered this encounter  Medications  . metoprolol (LOPRESSOR) 50 MG tablet    Sig: Take 0.5 tablets (25 mg total) by mouth 2 (two) times daily.    Dispense:  180 tablet    Refill:  3   See AVS for instructions about healthy living and anticipatory  guidance.  Follow-up: Return in about 2 months (around 02/24/2017).  [name], MD

## 2016-12-25 NOTE — Patient Instructions (Signed)

## 2016-12-25 NOTE — Progress Notes (Signed)
Pre visit review using our clinic review tool, if applicable. No additional management support is needed unless otherwise documented below in the visit note. 

## 2016-12-29 NOTE — Assessment & Plan Note (Signed)

## 2016-12-31 ENCOUNTER — Other Ambulatory Visit: Payer: Self-pay | Admitting: Internal Medicine

## 2016-12-31 DIAGNOSIS — M818 Other osteoporosis without current pathological fracture: Secondary | ICD-10-CM

## 2016-12-31 LAB — HM DEXA SCAN: HM Dexa Scan: -1.7

## 2016-12-31 MED ORDER — FOSTEUM PLUS PO CAPS: 1.0000 | ORAL_CAPSULE | Freq: Two times a day (BID) | ORAL | 11 refills | Status: DC

## 2016-12-31 MED ORDER — RISEDRONATE SODIUM 35 MG PO TABS: 35.0000 mg | ORAL_TABLET | ORAL | 3 refills | Status: DC

## 2017-01-15 ENCOUNTER — Emergency Department (HOSPITAL_COMMUNITY)
Admission: EM | Admit: 2017-01-15 | Discharge: 2017-01-16 | Disposition: A | Discharge status: Home / Self Care | Payer: Medicare HMO | Attending: Emergency Medicine | Admitting: Emergency Medicine

## 2017-01-15 ENCOUNTER — Emergency Department (HOSPITAL_COMMUNITY): Payer: Medicare HMO

## 2017-01-15 ENCOUNTER — Other Ambulatory Visit: Payer: Self-pay

## 2017-01-15 ENCOUNTER — Encounter (HOSPITAL_COMMUNITY): Payer: Self-pay | Admitting: Emergency Medicine

## 2017-01-15 DIAGNOSIS — I2510 Atherosclerotic heart disease of native coronary artery without angina pectoris: Secondary | ICD-10-CM | POA: Diagnosis not present

## 2017-01-15 DIAGNOSIS — Z79899 Other long term (current) drug therapy: Secondary | ICD-10-CM | POA: Diagnosis not present

## 2017-01-15 DIAGNOSIS — I4891 Unspecified atrial fibrillation: Secondary | ICD-10-CM | POA: Insufficient documentation

## 2017-01-15 DIAGNOSIS — I10 Essential (primary) hypertension: Secondary | ICD-10-CM | POA: Insufficient documentation

## 2017-01-15 DIAGNOSIS — R000 Tachycardia, unspecified: Secondary | ICD-10-CM | POA: Diagnosis not present

## 2017-01-15 DIAGNOSIS — Z7901 Long term (current) use of anticoagulants: Secondary | ICD-10-CM | POA: Insufficient documentation

## 2017-01-15 DIAGNOSIS — R0602 Shortness of breath: Secondary | ICD-10-CM | POA: Diagnosis present

## 2017-01-15 LAB — TROPONIN I

## 2017-01-15 LAB — CBC
HCT: 50.1 % — ABNORMAL HIGH (ref 36.0–46.0)
Hemoglobin: 16.8 g/dL — ABNORMAL HIGH (ref 12.0–15.0)
MCH: 30.4 pg (ref 26.0–34.0)
MCHC: 33.5 g/dL (ref 30.0–36.0)
MCV: 90.6 fL (ref 78.0–100.0)
PLATELETS: 191 10*3/uL (ref 150–400)
RBC: 5.53 MIL/uL — AB (ref 3.87–5.11)
RDW: 13.7 % (ref 11.5–15.5)
WBC: 5.8 10*3/uL (ref 4.0–10.5)

## 2017-01-15 LAB — BASIC METABOLIC PANEL
Anion gap: 9 (ref 5–15)
BUN: 17 mg/dL (ref 6–20)
CO2: 24 mmol/L (ref 22–32)
CREATININE: 1.03 mg/dL — AB (ref 0.44–1.00)
Calcium: 9.9 mg/dL (ref 8.9–10.3)
Chloride: 107 mmol/L (ref 101–111)
GFR calc Af Amer: 57 mL/min — ABNORMAL LOW (ref >60)
GFR calc non Af Amer: 49 mL/min — ABNORMAL LOW (ref >60)
Glucose, Bld: 105 mg/dL — ABNORMAL HIGH (ref 65–99)
Potassium: 3.6 mmol/L (ref 3.5–5.1)
SODIUM: 140 mmol/L (ref 135–145)

## 2017-01-15 MED ORDER — SODIUM CHLORIDE 0.9 % IV BOLUS (SEPSIS)
1000.0000 mL | Freq: Once | INTRAVENOUS | Status: AC
  Administered 2017-01-15: 1000 mL via INTRAVENOUS

## 2017-01-15 MED ORDER — PROPOFOL 10 MG/ML IV BOLUS
0.5000 mg/kg | Freq: Once | INTRAVENOUS | Status: AC
  Administered 2017-01-15: 45.2 mg via INTRAVENOUS
  Filled 2017-01-15: qty 20

## 2017-01-15 NOTE — ED Triage Notes (Signed)
Pt presents to ED for assessment of chest tightness, palpitations and shortness of breath starting approx 12am today, but worsening through out the day.  Hx of afib with RVR and need for cardioversion.

## 2017-01-15 NOTE — ED Provider Notes (Signed)
[facility] DEPT Provider Note   CSN: [id number] Arrival date & time: 01/15/17  2056     History   Chief Complaint Chief Complaint  Patient presents with  . Chest Pain  . Shortness of Breath    HPI [name] is a 83 y.o. female.  HPI  83 year old female with chest tightness, shortness of breath, and palpitations. Feels similar to when she was in A. fib in the past. Last episode was in 2016. This started suddenly and woke her up at midnight last night. Patient has tried extra metoprolol with no relief. She has not missed any doses of her Eliquis, which she has been on for the last 2 years. Last time this happened she was admitted to the hospital and was cardioverted by cardiology. No other recent illnesses or missed doses of medicines or change in medicines. She is not normally in A-fib.  Past Medical History:  Diagnosis Date  . Abnormal nuclear stress test    mild anterolateral septal and inferior ischemia  . Arthritis   . Atherosclerosis of lower extremity with claudication ([place]) 04/17/2015   LEFT LEG  . Atrial fibrillation ([place])   . Claudication ([place])   . Colon cancer ([place])   . Coronary atherosclerosis of unspecified type of vessel, native or graft   . Esophageal stricture   . Fatty liver 11/05/11  . Hiatal hernia   . HLD (hyperlipidemia)   . Hypertension   . Iron deficiency anemia, unspecified   . Ischemic colitis ([place])   . Neuropathy   . Osteoporosis   . PAF (paroxysmal atrial fibrillation) ([place]) 04/2015  . Unspecified vascular insufficiency of intestine     Patient Active Problem List   Diagnosis Date Noted  . Paresthesia 12/18/2016  . Abnormal glucose 12/18/2016  . 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 Estrogen deficiency 11/27/2016  . Neuropathy involving both lower extremities 11/27/2016  . Anticoagulation adequate 05/24/2015  . Atrial fibrillation with rapid ventricular response ([place]) 04/17/2015  . Abnormal  cardiovascular stress test   . Claudication ([place]) 03/09/2015  . Atherosclerosis of lower extremity with claudication ([place]) 02/25/2015  . GERD (gastroesophageal reflux disease) 02/25/2015  . Obesity (BMI 30.0-34.9) 07/19/2014  . Stricture and stenosis of esophagus 04/27/2014  . Rectal cancer ([place]) 10/01/2011  . Routine general medical examination at a health care facility 07/21/2011  . HTN (hypertension) 11/28/2010  . CROHN'S DISEASE, LARGE INTESTINE 01/09/2009  . Dyslipidemia, goal LDL below 100 11/08/2007  . MITRAL REGURGITATION, 0 (MILD) 11/08/2007  . ARTHRITIS 11/08/2007  . Osteoporosis 11/08/2007    Past Surgical History:  Procedure Laterality Date  . abdominoperineal resection anastomotic stricture  05/2007  . APPENDECTOMY    . CARDIAC CATHETERIZATION N/A 03/13/2015   Procedure: Left Heart Cath and Coronary Angiography;  Surgeon: Troy Sine, MD;  Location: [facility];  Service: Cardiovascular;  Laterality: N/A;  . CARDIOVERSION N/A 04/20/2015   Procedure: CARDIOVERSION;  Surgeon: Jerline Pain, MD;  Location: [facility];  Service: Cardiovascular;  Laterality: N/A;  . [place]  . ESOPHAGOGASTRODUODENOSCOPY N/A 04/27/2014   Procedure: ESOPHAGOGASTRODUODENOSCOPY (EGD);  Surgeon: Lafayette Dragon, MD;  Location: [facility];  Service: Endoscopy;  Laterality: N/A;  . Mandibular Renstruction    . ROTATOR CUFF REPAIR  2006   left  . Rt. Salpingo oophorectomy and cyst removal  2005  . SAVORY DILATION N/A 04/27/2014   Procedure: SAVORY DILATION;  Surgeon: Lafayette Dragon, MD;  Location: [facility];  Service: Endoscopy;  Laterality: N/A;  no xray needed  . sigmoid resection for invasive rectal adenocarcinoma  12/2006  . TEE WITHOUT CARDIOVERSION N/A 04/20/2015   Procedure: TRANSESOPHAGEAL ECHOCARDIOGRAM (TEE);  Surgeon: Jerline Pain, MD;  Location: [facility];  Service: Cardiovascular;  Laterality: N/A;  . TUBAL LIGATION    . WRIST SURGERY  2008     right    OB History    No data available       Home Medications    Prior to Admission medications   Medication Sig Start Date End Date Taking? Authorizing Provider  apixaban (ELIQUIS) 5 MG TABS tablet Take 1 tablet (5 mg total) by mouth 2 (two) times daily. 09/09/16  Yes Troy Sine, MD  Calcium Carbonate-Vitamin D (CALCIUM 600 + D PO) Take 2 tablets by mouth daily.   Yes [provider]  chlorthalidone (HYGROTON) 25 MG tablet Take 1 tablet (25 mg total) by mouth daily. 11/27/16  Yes Janith Lima, MD  gabapentin (NEURONTIN) 300 MG capsule Take 1 capsule (300 mg total) by mouth 3 (three) times daily. Patient taking differently: Take 300 mg by mouth at bedtime.  12/18/16  Yes Marcial Pacas, MD  metoprolol (LOPRESSOR) 50 MG tablet Take 0.5 tablets (25 mg total) by mouth 2 (two) times daily. Patient taking differently: Take 50 mg by mouth 2 (two) times daily.  12/25/16  Yes Janith Lima, MD  potassium chloride (K-DUR) 10 MEQ tablet Take 1 tablet on the days that you take the furosemide. Patient taking differently: Take 10 mEq by mouth every other day.  09/03/16  Yes Troy Sine, MD  ranitidine (ZANTAC) 300 MG tablet Take 1 tablet (300 mg total) by mouth every morning. 09/03/16  Yes Troy Sine, MD  sotalol (BETAPACE) 80 MG tablet Take 1 tablet (80 mg total) by mouth every 12 (twelve) hours. 09/03/16  Yes Troy Sine, MD  cyanocobalamin 2000 MCG tablet Take 1 tablet (2,000 mcg total) by mouth daily. Patient not taking: Reported on 01/15/2017 12/01/16   Janith Lima, MD  Dietary Management Product (FOSTEUM PLUS) CAPS Take 1 capsule by mouth 2 (two) times daily. Patient not taking: Reported on 01/15/2017 12/31/16   Janith Lima, MD  pregabalin (LYRICA) 50 MG capsule Take 1 capsule (50 mg total) by mouth at bedtime. Patient not taking: Reported on 01/15/2017 11/27/16   Janith Lima, MD  risedronate (ACTONEL) 35 MG tablet Take 1 tablet (35 mg total) by mouth every 7 (seven)  days. with water on empty stomach, nothing by mouth or lie down for next 30 minutes. 12/31/16   Janith Lima, MD    Family History Family History  Problem Relation Age of Onset  . Colon cancer Father 53  . Heart disease Father   . Hypertension Father   . Alzheimer's disease Mother   . Thyroid disease Mother   . Hypertension Sister   . Thyroid disease Sister   . Mitral valve prolapse Sister   . Hypertension Son   . Diabetes Son   . Hypertension Son   . Diabetes Son   . Esophageal cancer Neg Hx   . Rectal cancer Neg Hx   . Stomach cancer Neg Hx     Social History Social History  Substance Use Topics  . Smoking status: Never Smoker  . Smokeless tobacco: Never Used  . Alcohol use No     Allergies   Crestor [rosuvastatin calcium]; Acetaminophen-codeine; Amlodipine  besylate; Diltiazem hcl; Hydrocodone-acetaminophen; Irbesartan-hydrochlorothiazide; Lisinopril; Omeprazole; Propoxyphene n-acetaminophen; Valsartan; Warfarin sodium; and Verapamil   Review of Systems Review of Systems  Constitutional: Negative for fever.  Respiratory: Positive for chest tightness and shortness of breath.   Cardiovascular: Positive for palpitations. Negative for leg swelling.  Gastrointestinal: Negative for diarrhea and vomiting.  All other systems reviewed and are negative.    Physical Exam Updated Vital Signs BP (!) 143/83   Pulse 66   Temp 98.6 F (37 C) (Oral)   Resp (!) 22   Ht 5\' 6"  (1.676 m)   Wt 199 lb (90.3 kg)   SpO2 99%   BMI 32.12 kg/m   Physical Exam  Constitutional: She is oriented to person, place, and time. She appears well-developed and well-nourished. No distress.  HENT:  Head: Normocephalic and atraumatic.  Right Ear: External ear normal.  Left Ear: External ear normal.  Nose: Nose normal.  Eyes: Right eye exhibits no discharge. Left eye exhibits no discharge.  Cardiovascular: Normal heart sounds.  An irregularly irregular rhythm present. Tachycardia present.    Pulses:      Radial pulses are 2+ on the right side, and 2+ on the left side.  Pulmonary/Chest: Effort normal and breath sounds normal.  Abdominal: Soft. There is no tenderness.  Musculoskeletal: She exhibits no edema.  Neurological: She is alert and oriented to person, place, and time.  Skin: Skin is warm and dry. She is not diaphoretic.  Nursing note and vitals reviewed.    ED Treatments / Results  Labs (all labs ordered are listed, but only abnormal results are displayed) Labs Reviewed  BASIC METABOLIC PANEL - Abnormal; Notable for the following:       Result Value   Glucose, Bld 105 (*)    Creatinine, Ser 1.03 (*)    GFR calc non Af Amer 49 (*)    GFR calc Af Amer 57 (*)    All other components within normal limits  CBC - Abnormal; Notable for the following:    RBC 5.53 (*)    Hemoglobin 16.8 (*)    HCT 50.1 (*)    All other components within normal limits  TROPONIN I    EKG  EKG Interpretation  Date/Time:  Thursday Jan 15 2017 21:00:52 EDT Ventricular Rate:  141 PR Interval:  210 QRS Duration: 74 QT Interval:  188 QTC Calculation: 287 R Axis:   3 Text Interpretation:  afib with RVR? irregular tachycardia Low voltage QRS Cannot rule out Anterior infarct , age undetermined Abnormal ECG changes noted since 2016 Confirmed by Sherwood Gambler [phone]) on 01/15/2017 9:31:10 PM       EKG Interpretation  Date/Time:  Thursday Jan 15 2017 23:10:26 EDT Ventricular Rate:  65 PR Interval:  210 QRS Duration: 95 QT Interval:  398 QTC Calculation: 414 R Axis:   -13 Text Interpretation:  Sinus rhythm Low voltage, precordial leads Abnormal R-wave progression, early transition Baseline wander in lead(s) V1 V2 Afib resolved, now sinus rhythm Confirmed by Sherwood Gambler [phone]) on 01/15/2017 11:16:40 PM        Radiology Dg Chest Port 1 View  Result Date: 01/15/2017 CLINICAL DATA:  Atrial fibrillation, pre cardioversion EXAM: PORTABLE CHEST 1 VIEW COMPARISON:  04/17/2015  FINDINGS: The heart size and mediastinal contours are within normal limits. Both lungs are clear. External defibrillator paddles project over the heart and mediastinum. Osteoarthritis of the right AC joint with undersurface spurring. No acute osseous abnormality. IMPRESSION: No active disease. Electronically Signed   By:  Ashley Royalty M.D.   On: 01/15/2017 22:37    Procedures .Cardioversion Date/Time: 01/15/2017 11:27 PM Performed by: Sherwood Gambler Authorized by: Sherwood Gambler   Consent:    Consent obtained:  Written and verbal   Consent given by:  Patient   Risks discussed:  Cutaneous burn, death, pain and induced arrhythmia   Alternatives discussed:  Rate-control medication, alternative treatment, delayed treatment and no treatment Pre-procedure details:    Cardioversion basis:  Emergent   Rhythm:  Atrial fibrillation   Electrode placement:  Anterior-posterior Attempt one:    Cardioversion mode:  Synchronous   Shock (Joules):  200   Shock outcome:  Conversion to normal sinus rhythm Post-procedure details:    Patient status:  Awake   Patient tolerance of procedure:  Tolerated well, no immediate complications   (including critical care time)  Procedural sedation Performed by: Sherwood Gambler T Consent: Verbal consent obtained. Risks and benefits: risks, benefits and alternatives were discussed Required items: required blood products, implants, devices, and special equipment available Patient identity confirmed: arm band and provided demographic data Time out: Immediately prior to procedure a "time out" was called to verify the correct patient, procedure, equipment, support staff and site/side marked as required.  Sedation type: moderate (conscious) sedation NPO time confirmed and considedered  Sedatives: PROPOFOL  Physician Time at Bedside: 7 minutes  Vitals: Vital signs were monitored during sedation. Cardiac Monitor, pulse oximeter Patient tolerance: Patient tolerated  the procedure well with no immediate complications. Comments: Pt with uneventful recovered. Returned to pre-procedural sedation baseline    Medications Ordered in ED Medications  sodium chloride 0.9 % bolus 1,000 mL (1,000 mLs Intravenous New Bag/Given 01/15/17 2240)  propofol (DIPRIVAN) 10 mg/mL bolus/IV push 45.2 mg (45.2 mg Intravenous Given 01/15/17 2309)     Initial Impression / Assessment and Plan / ED Course  I have reviewed the triage vital signs and the nursing notes.  Pertinent labs & imaging results that were available during my care of the patient were reviewed by me and considered in my medical decision making (see chart for details).     Patient resents with symptomatic acute onset atrial fibrillation less than 24 hours ago. She is already on blood thinners and has been taking this consistently. Given these findings, I discussed with patient and family and they agree for ED cardioversion. Discussed risks and benefits. No apparent complications and she has returned to normal sinus rhythm. I believe her chest tightness and shortness of breath were related to the A. fib with RVR rather than ACS or other acute chest emergency. Now her symptoms have completely resolved with cardioversion. Given almost 24 hours of symptoms and a normal troponin, suspicion for acute cardiac disease besides arrhythmia is very low. Ambulated in the ED without difficulty. Will have her follow-up with her cardiologist as an outpatient.  CHA2DS2/VAS Stroke Risk Points      5 >= 2 Points: High Risk  1 - 1.99 Points: Medium Risk  0 Points: Low Risk    The patient's score has not changed in the past year.:  No Change         Details    Note: External data might be a factor in metrics not marked with    Points Metrics   This score determines the patient's risk of having a stroke if the  patient has atrial fibrillation.       0 Has Congestive Heart Failure:  No   1 Has Vascular Disease:  Yes  1 Has  Hypertension:  Yes   2 Age:  17   0 Has Diabetes:  No   0 Had Stroke:  No Had TIA:  No Had thromboembolism:  No   1 Female:  Yes         Final Clinical Impressions(s) / ED Diagnoses   Final diagnoses:  Atrial fibrillation with RVR ([place])    New Prescriptions New Prescriptions   No medications on file     Sherwood Gambler, MD 01/16/17 0021

## 2017-01-16 ENCOUNTER — Telehealth (HOSPITAL_COMMUNITY): Payer: Self-pay | Admitting: *Deleted

## 2017-01-16 NOTE — ED Notes (Signed)
Pt stable, ambulatory, states understanding of discharge instructions 

## 2017-01-16 NOTE — Telephone Encounter (Signed)
I cld and offered pt appt with afib clinic since being cardioverted in the ER on 01/15/2017.  Pt declined appt stating that she would like to see her cardiologist Dr. Claiborne Billings. I expressed to pt that is understandable and to please call their office ASAP for appt since she was cardioverted and hopefully she could see her cards or one of the PA/NPs.  I offered pt phone number, she stated she had it and would call this afternoon.

## 2017-01-22 ENCOUNTER — Ambulatory Visit (INDEPENDENT_AMBULATORY_CARE_PROVIDER_SITE_OTHER): Payer: Medicare HMO | Admitting: Cardiovascular Disease

## 2017-01-22 ENCOUNTER — Encounter: Payer: Self-pay | Admitting: Cardiovascular Disease

## 2017-01-22 DIAGNOSIS — I10 Essential (primary) hypertension: Secondary | ICD-10-CM

## 2017-01-22 DIAGNOSIS — I080 Rheumatic disorders of both mitral and aortic valves: Secondary | ICD-10-CM

## 2017-01-22 DIAGNOSIS — I480 Paroxysmal atrial fibrillation: Secondary | ICD-10-CM | POA: Diagnosis not present

## 2017-01-22 DIAGNOSIS — Z7901 Long term (current) use of anticoagulants: Secondary | ICD-10-CM | POA: Diagnosis not present

## 2017-01-22 DIAGNOSIS — E669 Obesity, unspecified: Secondary | ICD-10-CM

## 2017-01-22 NOTE — Patient Instructions (Signed)
Your physician has requested that you have an echocardiogram. Echocardiography is a painless test that uses sound waves to create images of your heart. It provides your doctor with information about the size and shape of your heart and how well your heart's chambers and valves are working. This procedure takes approximately one hour. There are no restrictions for this procedure. This will done at the [place] physician recommends that you schedule a follow-up appointment in: 3 months.

## 2017-01-22 NOTE — Progress Notes (Signed)
Patient ID: [name], female   DOB: 1932/11/03, 83 y.o.   MRN: [id number]    PCP: Dr. Eilleen Kempf  HPI: FARRAH Turner is a 83 y.o. female who presents for an 71 month cardiology evaluation.  [name] has a history of paroxysmal atrial fibrillation, hypertension, as well as hyperlipidemia. Remotely, she developed myalgias with simvastatin and had not been willing to try additional lipid lowering therapy. In August 2012 an echo Doppler study showed mild asymmetric LVH with proximal septal thickening without LVOT obstruction. She had grade 1 diastolic dysfunction with normal systolic function, mild MR, mild TR, and aortic valve sclerosis with mild MR.   In December 2014 follow-up blood work showed normal renal function with a BUN of 19, creatinine 0.8.  She continued to have significant hyperlipidemia with a total cholesterol of 250, triglycerides 221 HDL 53, VLDL 44 and LDL cholesterol 153.  TSH was normal at 2.275.   [name]  had noticed development of claudication, particularly involving her left leg with activity.  She had episodes of shortness of breath and fatigue.  She  underwent a lower extremity arterial Doppler study which was abnormal and showed an ABI of 0.63 on the left and 1.0 on the right.  The left common iliac, external iliac, common femoral artery and profunda demonstrated multiphasic flow.  However, the superficial femoral artery on the left demonstrated occlusive disease in the proximal to mid thigh with reconstitution of flow in the mid distal segment.  The popliteal artery demonstrated patent monophasic flow with three-vessel runoff.  The anterior tibial artery demonstrated focal stenosis in the proximal calf with dampened flow distal to this.  She presented to [facility] in August 2016 with complaints of palpitations, shortness of breath and presyncope.  She was noted to be in atrial fibrillation with rapid ventricular response.  Her heart rate was initially attempted  to be controlled with metoprolol and digitoxin, which was not successful and ultimately sotalol was started.  On 04/20/2015 she underwent successful TEE guided cardioversion.  She was continued on metoprolol as well as eliquis for anticoagulation.  An echo Doppler study on 04/18/2015 showed an EF of 55-60% with mild LVH.  She has multiple allergies and in the past did not tolerate ACE-inhibition or ARB therapy has been able to tolerate direct renin inhibition.  She has a history of GERD and has been taking ranitidine and sucralfate.  She has a history of hyperlipidemia and has had issues with statins.  I last saw her in October 2017 and was maintaining sinus rhythm.  Since I last saw her, she recently presented to the emergency room on 01/15/2017 with chest tightness, shortness of breath and palpitations.  She was found to be back in atrial fibrillation.  She has continued to take anticoagulation with eloquence 5 mg twice a day.  During that evaluation, she underwent successful cardioversion and sinus rhythm was restored with 200 J cardioversion.  Over the past week, she has felt improved.  Remotely, I had recommended sleep evaluation but she did not pursue this.  She also has been seen by neurology for peripheral neuropathy and lyrica was instituted.  She hasn't maintained on chlorthalidone 25 mg, metoprolol 50 mg in the morning and 25 mg at night, in addition to sotalol 80 mg twice a day.  He is on eloquence 5 mg twice a day.  She has been off his Crestor.  Repeat blood work done 1 week ago showed a glucose of 105.  She had normal renal function.  Hemoglobin and hematocrit were stable.  Past Medical History:  Diagnosis Date  . Abnormal nuclear stress test    mild anterolateral septal and inferior ischemia  . Arthritis   . Atherosclerosis of lower extremity with claudication ([place]) 04/17/2015   LEFT LEG  . Atrial fibrillation ([place])   . Claudication ([place])   . Colon cancer ([place])   . Coronary  atherosclerosis of unspecified type of vessel, native or graft   . Esophageal stricture   . Fatty liver 11/05/11  . Hiatal hernia   . HLD (hyperlipidemia)   . Hypertension   . Iron deficiency anemia, unspecified   . Ischemic colitis ([place])   . Neuropathy   . Osteoporosis   . PAF (paroxysmal atrial fibrillation) ([place]) 04/2015  . Unspecified vascular insufficiency of intestine     Past Surgical History:  Procedure Laterality Date  . abdominoperineal resection anastomotic stricture  05/2007  . APPENDECTOMY    . CARDIAC CATHETERIZATION N/A 03/13/2015   Procedure: Left Heart Cath and Coronary Angiography;  Surgeon: Troy Sine, MD;  Location: [facility];  Service: Cardiovascular;  Laterality: N/A;  . CARDIOVERSION N/A 04/20/2015   Procedure: CARDIOVERSION;  Surgeon: Jerline Pain, MD;  Location: [facility];  Service: Cardiovascular;  Laterality: N/A;  . [place]  . ESOPHAGOGASTRODUODENOSCOPY N/A 04/27/2014   Procedure: ESOPHAGOGASTRODUODENOSCOPY (EGD);  Surgeon: Lafayette Dragon, MD;  Location: [facility];  Service: Endoscopy;  Laterality: N/A;  . Mandibular Renstruction    . ROTATOR CUFF REPAIR  2006   left  . Rt. Salpingo oophorectomy and cyst removal  2005  . SAVORY DILATION N/A 04/27/2014   Procedure: SAVORY DILATION;  Surgeon: Lafayette Dragon, MD;  Location: [facility];  Service: Endoscopy;  Laterality: N/A;  no xray needed  . sigmoid resection for invasive rectal adenocarcinoma  12/2006  . TEE WITHOUT CARDIOVERSION N/A 04/20/2015   Procedure: TRANSESOPHAGEAL ECHOCARDIOGRAM (TEE);  Surgeon: Jerline Pain, MD;  Location: [facility];  Service: Cardiovascular;  Laterality: N/A;  . TUBAL LIGATION    . WRIST SURGERY  2008   right    Allergies  Allergen Reactions  . Crestor [Rosuvastatin Calcium] Other (See Comments)    Muscle aches  . Acetaminophen-Codeine   . Amlodipine Besylate     REACTION: swelling  . Diltiazem Hcl     REACTION: rash  .  Hydrocodone-Acetaminophen     nausea  . Irbesartan-Hydrochlorothiazide     REACTION: Dizziness  . Lisinopril     cough  . Omeprazole Nausea Only and Other (See Comments)    Dizziness, joint pain, weakness in legs, cramps in legs, stomach burned  . Propoxyphene N-Acetaminophen     Headache, and blotchy face  . Valsartan     REACTION: blurred vision  . Warfarin Sodium     REACTION: body aches and bleeding  . Verapamil Nausea Only, Palpitations and Other (See Comments)    Headaches     Current Outpatient Prescriptions  Medication Sig Dispense Refill  . apixaban (ELIQUIS) 5 MG TABS tablet Take 1 tablet (5 mg total) by mouth 2 (two) times daily. 180 tablet 3  . Calcium Carbonate-Vitamin D (CALCIUM 600 + D PO) Take 2 tablets by mouth daily.    . chlorthalidone (HYGROTON) 25 MG tablet Take 1 tablet (25 mg total) by mouth daily. 90 tablet 1  . cyanocobalamin 2000 MCG tablet Take 1 tablet (2,000 mcg total) by mouth daily. [place]  tablet 3  . Dietary Management Product (FOSTEUM PLUS) CAPS Take 1 capsule by mouth 2 (two) times daily. 60 capsule 11  . gabapentin (NEURONTIN) 300 MG capsule Take 1 capsule (300 mg total) by mouth 3 (three) times daily. (Patient taking differently: Take 300 mg by mouth at bedtime. ) 90 capsule 11  . metoprolol (LOPRESSOR) 50 MG tablet Take 0.5 tablets (25 mg total) by mouth 2 (two) times daily. (Patient taking differently: Take 50 mg by mouth 2 (two) times daily. ) 180 tablet 3  . potassium chloride (K-DUR) 10 MEQ tablet Take 1 tablet on the days that you take the furosemide. (Patient taking differently: Take 10 mEq by mouth every other day. ) 90 tablet 3  . ranitidine (ZANTAC) 300 MG tablet Take 1 tablet (300 mg total) by mouth every morning. 90 tablet 3  . risedronate (ACTONEL) 35 MG tablet Take 1 tablet (35 mg total) by mouth every 7 (seven) days. with water on empty stomach, nothing by mouth or lie down for next 30 minutes. 12 tablet 3  . sotalol (BETAPACE) 80 MG  tablet Take 1 tablet (80 mg total) by mouth every 12 (twelve) hours. 180 tablet 3   No current facility-administered medications for this visit.     Social History   Social History  . Marital status: Widowed    Spouse name: N/A  . Number of children: 4  . Years of education: 12th grade   Occupational History  . retired Retired   Social History Main Topics  . Smoking status: Never Smoker  . Smokeless tobacco: Never Used  . Alcohol use No  . Drug use: No  . Sexual activity: Not Currently   Other Topics Concern  . Not on file   Social History Narrative   Lives at home with fiance.   Right-handed.   No caffeine use.    Family History  Problem Relation Age of Onset  . Colon cancer Father 50  . Heart disease Father   . Hypertension Father   . Alzheimer's disease Mother   . Thyroid disease Mother   . Hypertension Sister   . Thyroid disease Sister   . Mitral valve prolapse Sister   . Hypertension Son   . Diabetes Son   . Hypertension Son   . Diabetes Son   . Esophageal cancer Neg Hx   . Rectal cancer Neg Hx   . Stomach cancer Neg Hx    Social history  is notable that she is widowed. She has 3 children and 9 grandchildren. She remains active. There is no alcohol tobacco use.   ROS General: Negative; No fevers, chills, or night sweats; positive for fatigue and shortness of breath HEENT: Negative; No changes in vision or hearing, sinus congestion, difficulty swallowing Pulmonary: Negative; No cough, wheezing, hemoptysis Cardiovascular: Positive for recurrent AF, status post cardioversion Positive for left leg claudication GI: Positive for GERD GU: Negative; No dysuria, hematuria, or difficulty voiding Musculoskeletal: Negative; no myalgias, joint pain, or weakness Hematologic/Oncology: Negative; no easy bruising, bleeding Endocrine: Negative; no heat/cold intolerance; no diabetes Neuro: Positive for peripheral neuropathy Skin: Negative; No rashes or skin  lesions Psychiatric: Negative; No behavioral problems, depression Sleep: Negative; No snoring, daytime sleepiness, hypersomnolence, bruxism, restless legs, hypnogognic hallucinations, no cataplexy Other comprehensive 14 point system review is negative.   PE BP 139/75   Pulse (!) 59   Ht 5' 6"  (1.676 m)   Wt 199 lb (90.3 kg)   BMI 32.12 kg/m  Repeat blood pressure by me was 132/84  Wt Readings from Last 3 Encounters:  01/22/17 199 lb (90.3 kg)  01/15/17 199 lb (90.3 kg)  12/25/16 199 lb (90.3 kg)      Physical Exam BP 139/75   Pulse (!) 59   Ht 5' 6"  (1.676 m)   Wt 199 lb (90.3 kg)   BMI 32.12 kg/m  General: Alert, oriented, no distress.  Skin: normal turgor, no rashes, warm and dry HEENT: Normocephalic, atraumatic. Pupils equal round and reactive to light; sclera anicteric; extraocular muscles intact;  Nose without nasal septal hypertrophy Mouth/Parynx benign; Mallinpatti scale 2 Neck: No JVD, no carotid bruits; normal carotid upstroke Lungs: clear to ausculatation and percussion; no wheezing or rales Chest wall: without tenderness to palpitation Heart: PMI not displaced, RRR, s1 s2 normal, 1/6 systolic murmur, faint AR murmur, no rubs, gallops, thrills, or heaves Abdomen: soft, nontender; no hepatosplenomehaly, BS+; abdominal aorta nontender and not dilated by palpation. Back: no CVA tenderness Pulses 2+ Musculoskeletal: full range of motion, normal strength, no joint deformities Extremities: no clubbing cyanosis or edema, Homan's sign negative  Neurologic: grossly nonfocal; Cranial nerves grossly wnl Psychologic: Normal mood and affect   ECG (independently read by me): Sinus bradycardia 59 bpm.  Low voltage.  Normal intervals.  No ST segment changes.  October 2017 ECG (independently read by me): Sinus bradycardia 57 bpm.  No ectopy.  Normal intervals.  November 2016 ECG (independently read by me): Sinus bradycardia 59 bpm.  No ectopy.  Normal intervals.  QTC  457 ms.  05/22/2015 ECG (independently read by me): Normal sinus rhythm at 60 bpm.  QTc interval 454 ms.  No significant ST segment changes.  June 2016 ECG (independently read by me): Normal sinus rhythm at 63 bpm.  Poor precordial R-wave progression.  No ectopy.  November 2015 ECG (independently read by me);  Normal sinus rhythm at 59 bpm.  QTc interval 415 ms.  No significant ST segment changes.  Prior November 2014ECG: Sinus rhythm at 63 beats per minute. Normal intervals.  LABS: BMP Latest Ref Rng & Units 01/15/2017 11/27/2016 08/28/2016  Glucose 65 - 99 mg/dL 105(H) 103(H) 89  BUN 6 - 20 mg/dL 17 21 15   Creatinine 0.44 - 1.00 mg/dL 1.03(H) 0.83 0.72  Sodium 135 - 145 mmol/L 140 142 142  Potassium 3.5 - 5.1 mmol/L 3.6 4.6 4.1  Chloride 101 - 111 mmol/L 107 106 106  CO2 22 - 32 mmol/L 24 29 29   Calcium 8.9 - 10.3 mg/dL 9.9 9.9 9.6   Hepatic Function Latest Ref Rng & Units 12/18/2016 11/27/2016 08/28/2016  Total Protein 6.0 - 8.5 g/dL 7.1 7.3 7.0  Albumin 3.5 - 5.2 g/dL - 4.4 4.3  AST 0 - 37 U/L - 20 19  ALT 0 - 35 U/L - 14 13  Alk Phosphatase 39 - 117 U/L - 64 60  Total Bilirubin 0.2 - 1.2 mg/dL - 1.0 1.1  Bilirubin, Direct 0.0 - 0.3 mg/dL - - -   CBC Latest Ref Rng & Units 01/15/2017 11/27/2016 06/11/2016  WBC 4.0 - 10.5 K/uL 5.8 5.1 4.6  Hemoglobin 12.0 - 15.0 g/dL 16.8(H) 15.8(H) 15.8(H)  Hematocrit 36.0 - 46.0 % 50.1(H) 46.3(H) 47.5(H)  Platelets 150 - 400 K/uL 191 175.0 176   Lab Results  Component Value Date   MCV 90.6 01/15/2017   MCV 89.3 11/27/2016   MCV 90.3 06/11/2016   Lab Results  Component Value Date   TSH 1.62 11/27/2016   Lab Results  Component Value Date   HGBA1C 5.5 12/18/2016   Lipid Panel     Component Value Date/Time   CHOL 187 08/28/2016 1009   TRIG 209 (H) 08/28/2016 1009   HDL 59 08/28/2016 1009   CHOLHDL 3.2 08/28/2016 1009   VLDL 42 (H) 08/28/2016 1009   LDLCALC 86 08/28/2016 1009   LDLDIRECT 152.3 02/10/2012 1010    RADIOLOGY: US  Breast Right  07/04/2013   CLINICAL DATA:  Abnormal screening right mammogram.  EXAM: DIGITAL DIAGNOSTIC  right MAMMOGRAM  ULTRASOUND right BREAST  COMPARISON:  With prior exams.  ACR Breast Density Category b: There are scattered areas of fibroglandular density.  FINDINGS: Spot compression views of the lateral aspect of the right breast were performed. There is persistence of a 4 mm low-density nodule in the posterior 3rd of the breast. There are no malignant type microcalcifications.  On physical exam I do not palpate a mass in the right breast.  Ultrasound is performed, showing there is a near anechoic lesion in the right breast at 7 o'clock 8 cm from the nipple measuring 3 x 2 x 3 mm.  IMPRESSION: Probable benign lesion in the right breast.  RECOMMENDATION: Short-term interval followup right mammogram and ultrasound in 6 months is recommended to document stability.  I have discussed the findings and recommendations with the patient. Results were also provided in writing at the conclusion of the visit. If applicable, a reminder letter will be sent to the patient regarding the next appointment.  BI-RADS CATEGORY  3: Probably benign finding(s) - short interval follow-up suggested.   Electronically Signed   By: Lillia Mountain M.D.   On: 07/04/2013 09:01   Mm [place] R  07/04/2013   CLINICAL DATA:  Abnormal screening right mammogram.  EXAM: DIGITAL DIAGNOSTIC  right MAMMOGRAM  ULTRASOUND right BREAST  COMPARISON:  With prior exams.  ACR Breast Density Category b: There are scattered areas of fibroglandular density.  FINDINGS: Spot compression views of the lateral aspect of the right breast were performed. There is persistence of a 4 mm low-density nodule in the posterior 3rd of the breast. There are no malignant type microcalcifications.  On physical exam I do not palpate a mass in the right breast.  Ultrasound is performed, showing there is a near anechoic lesion in the right breast at 7 o'clock 8 cm from  the nipple measuring 3 x 2 x 3 mm.  IMPRESSION: Probable benign lesion in the right breast.  RECOMMENDATION: Short-term interval followup right mammogram and ultrasound in 6 months is recommended to document stability.  I have discussed the findings and recommendations with the patient. Results were also provided in writing at the conclusion of the visit. If applicable, a reminder letter will be sent to the patient regarding the next appointment.  BI-RADS CATEGORY  3: Probably benign finding(s) - short interval follow-up suggested.   Electronically Signed   By: Lillia Mountain M.D.   On: 07/04/2013 09:01   IMPRESSION:  1. Paroxysmal atrial fibrillation (HCC)   2. MITRAL REGURGITATION, 0 (MILD)   3. Essential hypertension   4. Obesity (BMI 30.0-34.9)   5. Anticoagulation adequate      ASSESSMENT AND PLAN: Ms. Tukes is an 83 year old female who has a history of hypertension,  paroxysmal atrial fibrillation,  hyperlipidemia and peripheral vascular disease. She  is on eliquis for anticoagulation and tolerating this well.  She has recently been found to have recurrent AF and underwent successful cardioversion during her emergency room  evaluation on 01/15/2017.  She had seen her primary physician in April and her statin was discontinued.  Her blood pressure today is stable 128/78.  On her medical regimen consisting of metoprolol 50 mg in the morning and 25 mg at night in addition to her sotalol 80 mg twice a day.  QTc interval today is normal.  I discussed alternatives to statin and Zetia such as PCSK9 inhibition.  I do not believe she is interested.  I discussed the fact that she has had recurrent AF to consider an evaluation for possible sleep apnea.  She also does not seem interested.  She is now on Lyrica for peripheral neuropathy.  She is tolerating anticoagulation.  Repeat lab work will be obtained by primary physician next month.  With her recurrent AF, have recommended a follow-up 2-D echo Doppler study.   She is mildly obese with a BMI of 32.12.  Weight loss was recommended.  I will see her in 3 months for reevaluation Time spent: 25 minutes  Troy Sine, MD, [facility]  01/24/2017 9:19 PM

## 2017-01-30 ENCOUNTER — Ambulatory Visit (INDEPENDENT_AMBULATORY_CARE_PROVIDER_SITE_OTHER): Payer: Medicare HMO | Admitting: Neurology

## 2017-01-30 DIAGNOSIS — R202 Paresthesia of skin: Secondary | ICD-10-CM | POA: Diagnosis not present

## 2017-01-30 DIAGNOSIS — G5793 Unspecified mononeuropathy of bilateral lower limbs: Secondary | ICD-10-CM | POA: Diagnosis not present

## 2017-01-30 MED ORDER — GABAPENTIN 100 MG PO CAPS: 300.0000 mg | ORAL_CAPSULE | Freq: Every day | ORAL | 11 refills | Status: DC

## 2017-01-30 NOTE — Procedures (Signed)
Full Name: [name] Gender: Female MRN #: [id number] Date of Birth: [birth year]    Visit Date: 01/30/2017 09:01 Age: 83 Years 70 Months Old Examining Physician: Marcial Pacas, MD  Referring Physician: Marcial Pacas MD History: 83 years old female, presented with 2 years history of bilateral feet paresthesia  Summary of the test: Nerve conduction study: Bilateral superficial per neurosensory responses were absent. Bilateral sural sensory responses showed mildly decreased snap amplitude. Bilateral peroneal to EDB and tibial motor responses were normal.  Electromyography: Selected needle examination of bilateral lower extremity muscles and bilateral lumbar sacral paraspinal muscles were normal.  Conclusion:  This is a mild abnormal study. There is electrodiagnostic evidence of length dependent mild axonal sensorimotor polyneuropathy. There is no evidence of bilateral lumbosacral radiculopathy.   ------------------------------- Marcial Pacas, M.D.  [facility] [place], [place] [address] Tel: [phone] Fax: [phone]        [facility]    Nerve / Sites Rec. Site Latency Ref. Amplitude Ref. Rel Amp Segments Distance Velocity Ref. Area    ms ms mV mV %  cm m/s m/s mVms  R Peroneal - EDB     Ankle EDB 4.7 ?6.5 3.4 ?2.0 100 Ankle - EDB 9   13.0     Fib head EDB 11.9  3.3  96.2 Fib head - Ankle 35 49 ?44 14.2     Pop fossa EDB 14.3  2.9  87.1 Pop fossa - Fib head 12 50 ?44 13.3         Pop fossa - Ankle      L Peroneal - EDB     Ankle EDB 4.8 ?6.5 4.3 ?2.0 100 Ankle - EDB 9   13.9     Fib head EDB 11.2  3.5  80.1 Fib head - Ankle 35 55 ?44 13.8     Pop fossa EDB 13.4  2.9  83 Pop fossa - Fib head 12 55 ?44 12.1         Pop fossa - Ankle      R Tibial - AH     Ankle AH 3.6 ?5.8 6.1 ?4.0 100 Ankle - AH 9   18.4     Pop fossa AH 12.2  6.1  100 Pop fossa - Ankle 36 42 ?41 24.9  L Tibial - AH     Ankle AH 4.2 ?5.8 12.0 ?4.0 100 Ankle - AH 9   25.1    Pop fossa AH 12.8  8.3  68.8 Pop fossa - Ankle 36 42 ?41 20.1             SNC    Nerve / Sites Rec. Site Peak Lat Ref.  Amp Ref. Segments Distance    ms ms V V  cm  R Sural - Ankle (Calf)     Calf Ankle 3.59 ?4.40 3 ?6 Calf - Ankle 14  L Sural - Ankle (Calf)     Calf Ankle 4.01 ?4.40 5 ?6 Calf - Ankle 14  R Superficial peroneal - Ankle     Lat leg Ankle NR ?4.40 NR ?6 Lat leg - Ankle 14  L Superficial peroneal - Ankle     Lat leg Ankle NR ?4.40 NR ?6 Lat leg - Ankle 14             F  Wave    Nerve F Lat Ref.   ms ms  R Tibial - AH 50.8 ?56.0  L Tibial -  AH 49.8 ?56.0         EMG full       EMG Summary Table    Spontaneous MUAP Recruitment  Muscle IA Fib PSW Fasc Other Amp Dur. Poly Pattern  R. Tibialis anterior Normal None None None _______ Normal Normal Normal Normal  R. Gastrocnemius (Medial head) Normal None None None _______ Normal Normal Normal Normal  R. Vastus lateralis Normal None None None _______ Normal Normal Normal Normal  R. Abductor hallucis Normal None None None _______ Normal Normal Normal Normal  L. Tibialis anterior Normal None None None _______ Normal Normal Normal Normal  L. Peroneus longus Normal None None None _______ Normal Normal Normal Normal  L. Vastus lateralis Normal None None None _______ Normal Normal Normal Normal  L. Gastrocnemius (Medial head) Normal None None None _______ Normal Normal Normal Normal  R. Lumbar paraspinals (mid) Normal None None None _______ Normal Normal Normal Normal  R. Lumbar paraspinals (low) Normal None None None _______ Normal Normal Normal Normal  L. Lumbar paraspinals (mid) Normal None None None _______ Normal Normal Normal Normal  L. Lumbar paraspinals (low) Normal None None None _______ Normal Normal Normal Normal

## 2017-01-30 NOTE — Progress Notes (Signed)
PATIENT: [name] DOB: July 24, 1933  No chief complaint on file.    HISTORICAL  [name] is 83 years old right-handed female, seen in refer by her primary care Dr. [name] for evaluation of peripheral neuropathy, initial evaluation was on December 18 2016.  I reviewed and summarized the referring note, she had a history of hypertension, hyperlipidemia, atrial fibrillation, on chronic Eliquis treatment, also had a history of colon cancer, require colectomy, but did not receive chemotherapy radiation therapy in 2008  In 2016, she had a flareup of her atrial fibrillation, with rapid ventricular responding rate, during that period of time, she was treated with different medications, require shock treatment, then she noticed arm of her feet and her toes tingling, numbness sensation, over the years, her symptoms persistent, especially at nighttime, she has burning sensation at the bottom of the feet, below her ankle, the urge to move,  There was a diagnosis of iron deficiency anemia in the chart, most recent hemoglobin was 15 point 8, within normal limits. She was giving a sample of Lyrica 50 mg every night which has been very helpful,  She also complains of diffuse muscle achy pain since she was treated with statin, currently she is taking low-dose Crestor 5 mg daily  I was able to review laboratory evaluation, normal CMP with exception of mild elevated glucose 103, hemoglobin was 15 point 8, B12 was 161, normal folic acid more than 24, thyroid functional test, negative UA  UPDATE January 30 2017: She was taking gabapentin 300 mg every night, which has helped her bilateral feet paresthesia she can sleep better but she has morning home over, dizziness, she also tried low-dose Lyrica, was less optimal,  We have reviewed extensive laboratory evaluation there was no significant abnormality on BMP, normal vitamin D, ANA iron panel, ferritin was 63, angiotensin-converting enzyme, copper  level, Lyme titer, protein electrophoresis, ESR, C-reactive protein, CPK, A1c was 5.5, thyroid panel was within normal limit, vitamin B12 was  normal 287  EMG nerve conduction study today showed mild length dependent peripheral neuropathy  REVIEW OF SYSTEMS: Full 14 system review of systems performed and notable only for shortness of breath, fatigue, cough, hearing loss, ringing ears, joint pain, achy muscles  ALLERGIES: Allergies  Allergen Reactions  . Crestor [Rosuvastatin Calcium] Other (See Comments)    Muscle aches  . Acetaminophen-Codeine   . Amlodipine Besylate     REACTION: swelling  . Diltiazem Hcl     REACTION: rash  . Hydrocodone-Acetaminophen     nausea  . Irbesartan-Hydrochlorothiazide     REACTION: Dizziness  . Lisinopril     cough  . Omeprazole Nausea Only and Other (See Comments)    Dizziness, joint pain, weakness in legs, cramps in legs, stomach burned  . Propoxyphene N-Acetaminophen     Headache, and blotchy face  . Valsartan     REACTION: blurred vision  . Warfarin Sodium     REACTION: body aches and bleeding  . Verapamil Nausea Only, Palpitations and Other (See Comments)    Headaches     HOME MEDICATIONS: Current Outpatient Prescriptions  Medication Sig Dispense Refill  . apixaban (ELIQUIS) 5 MG TABS tablet Take 1 tablet (5 mg total) by mouth 2 (two) times daily. 180 tablet 3  . Calcium Carbonate-Vitamin D (CALCIUM 600 + D PO) Take 2 tablets by mouth daily.    . chlorthalidone (HYGROTON) 25 MG tablet Take 1 tablet (25 mg total) by mouth daily. 90 tablet 1  .  cyanocobalamin 2000 MCG tablet Take 1 tablet (2,000 mcg total) by mouth daily. 90 tablet 3  . Dietary Management Product (FOSTEUM PLUS) CAPS Take 1 capsule by mouth 2 (two) times daily. 60 capsule 11  . gabapentin (NEURONTIN) 300 MG capsule Take 1 capsule (300 mg total) by mouth 3 (three) times daily. (Patient taking differently: Take 300 mg by mouth at bedtime. ) 90 capsule 11  . metoprolol  (LOPRESSOR) 50 MG tablet Take 0.5 tablets (25 mg total) by mouth 2 (two) times daily. (Patient taking differently: Take 50 mg by mouth 2 (two) times daily. ) 180 tablet 3  . potassium chloride (K-DUR) 10 MEQ tablet Take 1 tablet on the days that you take the furosemide. (Patient taking differently: Take 10 mEq by mouth every other day. ) 90 tablet 3  . ranitidine (ZANTAC) 300 MG tablet Take 1 tablet (300 mg total) by mouth every morning. 90 tablet 3  . risedronate (ACTONEL) 35 MG tablet Take 1 tablet (35 mg total) by mouth every 7 (seven) days. with water on empty stomach, nothing by mouth or lie down for next 30 minutes. 12 tablet 3  . sotalol (BETAPACE) 80 MG tablet Take 1 tablet (80 mg total) by mouth every 12 (twelve) hours. 180 tablet 3   No current facility-administered medications for this visit.     PAST MEDICAL HISTORY: Past Medical History:  Diagnosis Date  . Abnormal nuclear stress test    mild anterolateral septal and inferior ischemia  . Arthritis   . Atherosclerosis of lower extremity with claudication ([place]) 04/17/2015   LEFT LEG  . Atrial fibrillation ([place])   . Claudication ([place])   . Colon cancer ([place])   . Coronary atherosclerosis of unspecified type of vessel, native or graft   . Esophageal stricture   . Fatty liver 11/05/11  . Hiatal hernia   . HLD (hyperlipidemia)   . Hypertension   . Iron deficiency anemia, unspecified   . Ischemic colitis ([place])   . Neuropathy   . Osteoporosis   . PAF (paroxysmal atrial fibrillation) ([place]) 04/2015  . Unspecified vascular insufficiency of intestine     PAST SURGICAL HISTORY: Past Surgical History:  Procedure Laterality Date  . abdominoperineal resection anastomotic stricture  05/2007  . APPENDECTOMY    . CARDIAC CATHETERIZATION N/A 03/13/2015   Procedure: Left Heart Cath and Coronary Angiography;  Surgeon: Troy Sine, MD;  Location: [facility];  Service: Cardiovascular;  Laterality: N/A;  . CARDIOVERSION N/A 04/20/2015    Procedure: CARDIOVERSION;  Surgeon: Jerline Pain, MD;  Location: [facility];  Service: Cardiovascular;  Laterality: N/A;  . [place]  . ESOPHAGOGASTRODUODENOSCOPY N/A 04/27/2014   Procedure: ESOPHAGOGASTRODUODENOSCOPY (EGD);  Surgeon: Lafayette Dragon, MD;  Location: [facility];  Service: Endoscopy;  Laterality: N/A;  . Mandibular Renstruction    . ROTATOR CUFF REPAIR  2006   left  . Rt. Salpingo oophorectomy and cyst removal  2005  . SAVORY DILATION N/A 04/27/2014   Procedure: SAVORY DILATION;  Surgeon: Lafayette Dragon, MD;  Location: [facility];  Service: Endoscopy;  Laterality: N/A;  no xray needed  . sigmoid resection for invasive rectal adenocarcinoma  12/2006  . TEE WITHOUT CARDIOVERSION N/A 04/20/2015   Procedure: TRANSESOPHAGEAL ECHOCARDIOGRAM (TEE);  Surgeon: Jerline Pain, MD;  Location: [facility];  Service: Cardiovascular;  Laterality: N/A;  . TUBAL LIGATION    . WRIST SURGERY  2008   right    FAMILY HISTORY:  Family History  Problem Relation Age of Onset  . Colon cancer Father 80  . Heart disease Father   . Hypertension Father   . Alzheimer's disease Mother   . Thyroid disease Mother   . Hypertension Sister   . Thyroid disease Sister   . Mitral valve prolapse Sister   . Hypertension Son   . Diabetes Son   . Hypertension Son   . Diabetes Son   . Esophageal cancer Neg Hx   . Rectal cancer Neg Hx   . Stomach cancer Neg Hx     SOCIAL HISTORY:  Social History   Social History  . Marital status: Widowed    Spouse name: N/A  . Number of children: 4  . Years of education: 12th grade   Occupational History  . retired Retired   Social History Main Topics  . Smoking status: Never Smoker  . Smokeless tobacco: Never Used  . Alcohol use No  . Drug use: No  . Sexual activity: Not Currently   Other Topics Concern  . Not on file   Social History Narrative   Lives at home with fiance.   Right-handed.   No caffeine use.      PHYSICAL EXAM   There were no vitals filed for this visit.  Not recorded      There is no height or weight on file to calculate BMI.  PHYSICAL EXAMNIATION:  Gen: NAD, conversant, well nourised, obese, well groomed                     Cardiovascular: Regular rate rhythm, no peripheral edema, warm, nontender. Eyes: Conjunctivae clear without exudates or hemorrhage Neck: Supple, no carotid bruits. Pulmonary: Clear to auscultation bilaterally   NEUROLOGICAL EXAM:  MENTAL STATUS: Speech:    Speech is normal; fluent and spontaneous with normal comprehension.  Cognition:     Orientation to time, place and person     Normal recent and remote memory     Normal Attention span and concentration     Normal Language, naming, repeating,spontaneous speech     Fund of knowledge   CRANIAL NERVES: CN II: Visual fields are full to confrontation. Fundoscopic exam is normal with sharp discs and no vascular changes. Pupils are round equal and briskly reactive to light. CN III, IV, VI: extraocular movement are normal. No ptosis. CN V: Facial sensation is intact to pinprick in all 3 divisions bilaterally. Corneal responses are intact.  CN VII: Face is symmetric with normal eye closure and smile. CN VIII: Hearing is normal to rubbing fingers CN IX, X: Palate elevates symmetrically. Phonation is normal. CN XI: Head turning and shoulder shrug are intact CN XII: Tongue is midline with normal movements and no atrophy.  MOTOR: There is no pronator drift of out-stretched arms. Muscle bulk and tone are normal. Muscle strength is normal.  REFLEXES: Reflexes are 2+ and symmetric at the biceps, triceps, knees, and absent at ankles. Plantar responses are flexor.  SENSORY: Mildly length dependent decreased to light touch, pinprick, and vibratory sensation at toes.  COORDINATION: Rapid alternating movements and fine finger movements are intact. There is no dysmetria on finger-to-nose and  heel-knee-shin.    GAIT/STANCE: Posture is normal. Gait is steady with normal steps, base, arm swing, and turning. Heel and toe walking are normal. Tandem gait is normal.  Romberg is absent.   DIAGNOSTIC DATA (LABS, IMAGING, TESTING) - I reviewed patient records, labs, notes, testing and imaging myself where available.  ASSESSMENT AND PLAN  [name] is a 84 y.o. female   Bilateral feet paresthesia,  Consistent with mild peripheral neuropathy,  No treatable etiology found  Proceed with Neurontin 100 mg with titrating to 300 mg 3 times a day  Marcial Pacas, M.D. Ph.D.  [facility] [address], [place], [place] [address] Ph: [phone] Fax: [phone]  CC: Janith Lima, MD

## 2017-02-06 ENCOUNTER — Ambulatory Visit (HOSPITAL_COMMUNITY): Payer: Medicare HMO | Attending: Cardiovascular Disease

## 2017-02-06 ENCOUNTER — Other Ambulatory Visit: Payer: Self-pay

## 2017-02-06 DIAGNOSIS — I119 Hypertensive heart disease without heart failure: Secondary | ICD-10-CM | POA: Diagnosis not present

## 2017-02-06 DIAGNOSIS — E669 Obesity, unspecified: Secondary | ICD-10-CM | POA: Insufficient documentation

## 2017-02-06 DIAGNOSIS — I10 Essential (primary) hypertension: Secondary | ICD-10-CM | POA: Diagnosis not present

## 2017-02-06 DIAGNOSIS — I480 Paroxysmal atrial fibrillation: Secondary | ICD-10-CM | POA: Diagnosis not present

## 2017-02-06 DIAGNOSIS — Z6832 Body mass index (BMI) 32.0-32.9, adult: Secondary | ICD-10-CM | POA: Insufficient documentation

## 2017-02-06 DIAGNOSIS — E785 Hyperlipidemia, unspecified: Secondary | ICD-10-CM | POA: Insufficient documentation

## 2017-02-06 DIAGNOSIS — I080 Rheumatic disorders of both mitral and aortic valves: Secondary | ICD-10-CM | POA: Diagnosis not present

## 2017-02-24 ENCOUNTER — Encounter: Payer: Self-pay | Admitting: Internal Medicine

## 2017-02-24 ENCOUNTER — Ambulatory Visit (INDEPENDENT_AMBULATORY_CARE_PROVIDER_SITE_OTHER): Payer: Medicare HMO | Admitting: Internal Medicine

## 2017-02-24 VITALS — BP 126/60 | HR 57 | Temp 97.9°F | Resp 16 | Ht 66.0 in | Wt 197.0 lb

## 2017-02-24 DIAGNOSIS — I10 Essential (primary) hypertension: Secondary | ICD-10-CM | POA: Diagnosis not present

## 2017-02-24 DIAGNOSIS — D751 Secondary polycythemia: Secondary | ICD-10-CM | POA: Diagnosis not present

## 2017-02-24 DIAGNOSIS — I4891 Unspecified atrial fibrillation: Secondary | ICD-10-CM | POA: Diagnosis not present

## 2017-02-24 NOTE — Progress Notes (Signed)
Subjective:  Patient ID: [name], female    DOB: [birth year]  Age: 83 y.o. MRN: [id number]  CC: Atrial Fibrillation   HPI VYCTORIA DICKMAN presents for f/up - She recently took it upon herself to lower her sotalol dose and she ended up in the ED with A. fib with RVR. She is back on a full dose of sotalol and metoprolol and tells me her heart rate has been normal. She's had no recent episodes of palpitations, chest pain, shortness of breath, DOE, edema, or fatigue.  Outpatient Medications Prior to Visit  Medication Sig Dispense Refill  . apixaban (ELIQUIS) 5 MG TABS tablet Take 1 tablet (5 mg total) by mouth 2 (two) times daily. 180 tablet 3  . Calcium Carbonate-Vitamin D (CALCIUM 600 + D PO) Take 2 tablets by mouth daily.    . chlorthalidone (HYGROTON) 25 MG tablet Take 1 tablet (25 mg total) by mouth daily. 90 tablet 1  . cyanocobalamin 2000 MCG tablet Take 1 tablet (2,000 mcg total) by mouth daily. 90 tablet 3  . Dietary Management Product (FOSTEUM PLUS) CAPS Take 1 capsule by mouth 2 (two) times daily. 60 capsule 11  . gabapentin (NEURONTIN) 300 MG capsule Take 1 capsule (300 mg total) by mouth 3 (three) times daily. (Patient taking differently: Take 300 mg by mouth at bedtime. ) 90 capsule 11  . metoprolol (LOPRESSOR) 50 MG tablet Take 0.5 tablets (25 mg total) by mouth 2 (two) times daily. (Patient taking differently: Take 50 mg by mouth 2 (two) times daily. 1 tab in am and 0.5 tab at night) 180 tablet 3  . potassium chloride (K-DUR) 10 MEQ tablet Take 1 tablet on the days that you take the furosemide. (Patient taking differently: Take 10 mEq by mouth every other day. ) 90 tablet 3  . ranitidine (ZANTAC) 300 MG tablet Take 1 tablet (300 mg total) by mouth every morning. 90 tablet 3  . risedronate (ACTONEL) 35 MG tablet Take 1 tablet (35 mg total) by mouth every 7 (seven) days. with water on empty stomach, nothing by mouth or lie down for next 30 minutes. 12 tablet 3  . sotalol  (BETAPACE) 80 MG tablet Take 1 tablet (80 mg total) by mouth every 12 (twelve) hours. 180 tablet 3  . gabapentin (NEURONTIN) 100 MG capsule Take 3 capsules (300 mg total) by mouth at bedtime. 90 capsule 11   No facility-administered medications prior to visit.     ROS Review of Systems  Constitutional: Negative.  Negative for appetite change, diaphoresis, fatigue and unexpected weight change.  HENT: Negative.   Eyes: Negative.  Negative for visual disturbance.  Respiratory: Negative for cough, chest tightness and shortness of breath.   Cardiovascular: Negative for chest pain, palpitations and leg swelling.  Gastrointestinal: Negative for abdominal pain, constipation, diarrhea, nausea and vomiting.  Endocrine: Negative.   Genitourinary: Negative.   Musculoskeletal: Negative.  Negative for back pain and myalgias.  Skin: Negative.  Negative for color change and rash.  Allergic/Immunologic: Negative.   Neurological: Negative.  Negative for dizziness, weakness and light-headedness.  Hematological: Negative for adenopathy. Does not bruise/bleed easily.  Psychiatric/Behavioral: Negative.     Objective:  BP 126/60 (BP Location: Left Arm, Patient Position: Sitting, Cuff Size: Large)   Pulse (!) 57   Temp 97.9 F (36.6 C) (Oral)   Resp 16   Ht 5\' 6"  (1.676 m)   Wt 197 lb (89.4 kg)   SpO2 99%   BMI 31.80 kg/m  BP Readings from Last 3 Encounters:  02/24/17 126/60  01/22/17 139/75  01/16/17 (!) 145/88    Wt Readings from Last 3 Encounters:  02/24/17 197 lb (89.4 kg)  01/22/17 199 lb (90.3 kg)  01/15/17 199 lb (90.3 kg)    Physical Exam  Constitutional: She is oriented to person, place, and time. No distress.  HENT:  Mouth/Throat: Oropharynx is clear and moist. No oropharyngeal exudate.  Eyes: Conjunctivae are normal. Right eye exhibits no discharge. Left eye exhibits no discharge. No scleral icterus.  Neck: Normal range of motion. Neck supple. No JVD present. No thyromegaly  present.  Cardiovascular: Normal rate, regular rhythm and intact distal pulses.  Exam reveals no gallop.   No murmur heard. Pulmonary/Chest: Effort normal and breath sounds normal. No respiratory distress. She has no wheezes. She has no rales. She exhibits no tenderness.  Abdominal: Soft. Bowel sounds are normal. She exhibits no distension and no mass. There is no tenderness. There is no rebound and no guarding.  Musculoskeletal: Normal range of motion. She exhibits no edema, tenderness or deformity.  Lymphadenopathy:    She has no cervical adenopathy.  Neurological: She is alert and oriented to person, place, and time.  Skin: Skin is warm and dry. No rash noted. She is not diaphoretic. No erythema. No pallor.  Vitals reviewed.   Lab Results  Component Value Date   WBC 5.8 01/15/2017   HGB 16.8 (H) 01/15/2017   HCT 50.1 (H) 01/15/2017   PLT 191 01/15/2017   GLUCOSE 105 (H) 01/15/2017   CHOL 187 08/28/2016   TRIG 209 (H) 08/28/2016   HDL 59 08/28/2016   LDLDIRECT 152.3 02/10/2012   LDLCALC 86 08/28/2016   ALT 14 11/27/2016   AST 20 11/27/2016   NA 140 01/15/2017   K 3.6 01/15/2017   CL 107 01/15/2017   CREATININE 1.03 (H) 01/15/2017   BUN 17 01/15/2017   CO2 24 01/15/2017   TSH 1.62 11/27/2016   INR 1.32 04/18/2015   HGBA1C 5.5 12/18/2016    Dg Chest Port 1 View  Result Date: 01/15/2017 CLINICAL DATA:  Atrial fibrillation, pre cardioversion EXAM: PORTABLE CHEST 1 VIEW COMPARISON:  04/17/2015 FINDINGS: The heart size and mediastinal contours are within normal limits. Both lungs are clear. External defibrillator paddles project over the heart and mediastinum. Osteoarthritis of the right AC joint with undersurface spurring. No acute osseous abnormality. IMPRESSION: No active disease. Electronically Signed   By: Ashley Royalty M.D.   On: 01/15/2017 22:37    Assessment & Plan:   Lurine was seen today for atrial fibrillation.  Diagnoses and all orders for this  visit:  Erythrocytosis- her H&H continued to climb, she has no known pulmonary disease and doesn't smoke. I'm not sure the cause of this cyst of asked her to see hematology to see if it needs to be investigated further. -     Ambulatory referral to Hematology   I am having Ms. Kulish maintain her Calcium Carbonate-Vitamin D (CALCIUM 600 + D PO), potassium chloride, ranitidine, sotalol, apixaban, chlorthalidone, cyanocobalamin, gabapentin, metoprolol tartrate, FOSTEUM PLUS, and risedronate.  No orders of the defined types were placed in this encounter.    Follow-up: Return in about 4 months (around 06/26/2017).  [name], MD

## 2017-02-24 NOTE — Patient Instructions (Signed)

## 2017-02-25 NOTE — Assessment & Plan Note (Signed)
Her blood pressure is well controlled. 

## 2017-02-25 NOTE — Assessment & Plan Note (Signed)
She has good rate and rhythm control 

## 2017-03-31 ENCOUNTER — Telehealth: Payer: Self-pay | Admitting: Hematology and Oncology

## 2017-03-31 ENCOUNTER — Encounter: Payer: Self-pay | Admitting: Hematology and Oncology

## 2017-03-31 NOTE — Telephone Encounter (Signed)
Appt has been scheduled for the pt to see Dr. Lebron Conners on 8/9 at 10am. Address and insurance verified. Letter mailed.

## 2017-04-08 ENCOUNTER — Telehealth: Payer: Self-pay | Admitting: Internal Medicine

## 2017-04-08 NOTE — Telephone Encounter (Signed)
1. I don't think the med is doing that. Has she had her eyes checked? 2. YES!

## 2017-04-08 NOTE — Telephone Encounter (Signed)
1. Please advise if there is alternative to the lyrica and gabapentin?  2. Would Risedronate cause pt hernia pain?

## 2017-04-08 NOTE — Telephone Encounter (Signed)
Pt called stating that she thinks she is having some medication reaction issues. She said that the Lyrica and Gabapentin are messing with her eye site. It sounded like the Lyrica issue had already been mentioned to Dr Ronnald Ramp. She said that she has reduced the Gabapentin to 100mg  every other day to help with the pain in her feet and to limit the eye site issues. She said that her vision is blurry and it is hard to read. She wanted to know if there is anything else she can try that would help with the pain but not interfere with her vision.  She also said that she has been having pain around her chest area. She is taking Risedronate 35mg  and one of the side effects is esophagus problems. She has a hernia and she thinks that this medication is causing it to hurt. Please advise.

## 2017-04-09 ENCOUNTER — Telehealth: Payer: Self-pay | Admitting: Hematology and Oncology

## 2017-04-09 ENCOUNTER — Encounter: Payer: Medicare HMO | Admitting: Hematology and Oncology

## 2017-04-09 ENCOUNTER — Ambulatory Visit (HOSPITAL_BASED_OUTPATIENT_CLINIC_OR_DEPARTMENT_OTHER): Payer: Medicare HMO

## 2017-04-09 ENCOUNTER — Encounter: Payer: Self-pay | Admitting: Hematology and Oncology

## 2017-04-09 ENCOUNTER — Ambulatory Visit (HOSPITAL_BASED_OUTPATIENT_CLINIC_OR_DEPARTMENT_OTHER): Payer: Medicare HMO | Admitting: Hematology and Oncology

## 2017-04-09 VITALS — BP 153/71 | HR 60 | Temp 97.8°F | Resp 16 | Ht 66.0 in | Wt 200.7 lb

## 2017-04-09 DIAGNOSIS — D751 Secondary polycythemia: Secondary | ICD-10-CM

## 2017-04-09 LAB — CBC & DIFF AND RETIC
BASO%: 0.4 % (ref 0.0–2.0)
BASOS ABS: 0 10*3/uL (ref 0.0–0.1)
EOS ABS: 0.2 10*3/uL (ref 0.0–0.5)
EOS%: 4.6 % (ref 0.0–7.0)
HEMATOCRIT: 45.1 % (ref 34.8–46.6)
HEMOGLOBIN: 15 g/dL (ref 11.6–15.9)
Immature Retic Fract: 6 % (ref 1.60–10.00)
LYMPH%: 40.7 % (ref 14.0–49.7)
MCH: 30.6 pg (ref 25.1–34.0)
MCHC: 33.3 g/dL (ref 31.5–36.0)
MCV: 92 fL (ref 79.5–101.0)
MONO#: 0.5 10*3/uL (ref 0.1–0.9)
MONO%: 10.5 % (ref 0.0–14.0)
NEUT#: 2 10*3/uL (ref 1.5–6.5)
NEUT%: 43.8 % (ref 38.4–76.8)
PLATELETS: 166 10*3/uL (ref 145–400)
RBC: 4.9 10*6/uL (ref 3.70–5.45)
RDW: 13.7 % (ref 11.2–14.5)
RETIC %: 1.52 % (ref 0.70–2.10)
Retic Ct Abs: 74.48 10*3/uL (ref 33.70–90.70)
WBC: 4.6 10*3/uL (ref 3.9–10.3)
lymph#: 1.9 10*3/uL (ref 0.9–3.3)

## 2017-04-09 LAB — COMPREHENSIVE METABOLIC PANEL
ALT: 21 U/L (ref 0–55)
AST: 26 U/L (ref 5–34)
Albumin: 3.9 g/dL (ref 3.5–5.0)
Alkaline Phosphatase: 57 U/L (ref 40–150)
Anion Gap: 8 mEq/L (ref 3–11)
BUN: 16.5 mg/dL (ref 7.0–26.0)
CALCIUM: 9.9 mg/dL (ref 8.4–10.4)
CHLORIDE: 103 meq/L (ref 98–109)
CO2: 31 meq/L — AB (ref 22–29)
CREATININE: 0.8 mg/dL (ref 0.6–1.1)
EGFR: 65 mL/min/{1.73_m2} — ABNORMAL LOW (ref >90)
Glucose: 93 mg/dl (ref 70–140)
Potassium: 3.5 mEq/L (ref 3.5–5.1)
Sodium: 143 mEq/L (ref 136–145)
Total Bilirubin: 1.21 mg/dL — ABNORMAL HIGH (ref 0.20–1.20)
Total Protein: 7.2 g/dL (ref 6.4–8.3)

## 2017-04-09 LAB — LACTATE DEHYDROGENASE: LDH: 207 U/L (ref 125–245)

## 2017-04-09 LAB — MORPHOLOGY
PLT EST: ADEQUATE
RBC Comments: NORMAL

## 2017-04-09 NOTE — Telephone Encounter (Signed)
Do you want pt to keep taking the risedronate? Pt stated that it doesn't cause pain all of the time but when it does it is very painful.   Pt stated that she will have her eyes checked.

## 2017-04-09 NOTE — Telephone Encounter (Signed)
Pt informed of MD response.  

## 2017-04-09 NOTE — Patient Instructions (Signed)
Thank you for choosing [facility] to provide your oncology and hematology care.  To afford each patient quality time with our providers, please arrive 30 minutes before your scheduled appointment time.  If you arrive late for your appointment, you may be asked to reschedule.  We strive to give you quality time with our providers, and arriving late affects you and other patients whose appointments are after yours.   If you are a no show for multiple scheduled visits, you may be dismissed from the clinic at the providers discretion.    Again, thank you for choosing [facility], our hope is that these requests will decrease the amount of time that you wait before being seen by our physicians.  ______________________________________________________________________  Should you have questions after your visit to the [facility], please contact our office at [phone] between the hours of 8:30 and 4:30 p.m.    Voicemails left after 4:30p.m will not be returned until the following business day.    For prescription refill requests, please have your pharmacy contact us directly.  Please also try to allow 48 hours for prescription requests.    Please contact the scheduling department for questions regarding scheduling.  For scheduling of procedures such as PET scans, CT scans, MRI, Ultrasound, etc please contact central scheduling at [phone].    Resources For Cancer Patients and Caregivers:   Oncolink.org:  A wonderful resource for patients and healthcare providers for information regarding your disease, ways to tract your treatment, what to expect, etc.     American Cancer Society:  800-227-2345  Can help patients locate various types of support and financial assistance  Cancer Care: 1-800-813-HOPE (4673) Provides financial assistance, online support groups, medication/co-pay assistance.    [place] DSS:  336-641-3447 Where to apply for food  stamps, Medicaid, and utility assistance  Medicare Rights Center: 800-333-4114 Helps people with Medicare understand their rights and benefits, navigate the Medicare system, and secure the quality healthcare they deserve  SCAT: 336-333-6589 [place] [employer]'s shared-ride transportation service for eligible riders who have a disability that prevents them from riding the fixed route bus.    For additional information on assistance programs please contact our social worker:   Grier Hock/Abigail Elmore:  [phone]            

## 2017-04-09 NOTE — Telephone Encounter (Signed)
Stop risedronate. 

## 2017-04-09 NOTE — Telephone Encounter (Signed)
Schedule apt per 8/9 los - Gave patient AVS and calender per los.

## 2017-04-10 LAB — ERYTHROPOIETIN: ERYTHROPOIETIN: 14.5 m[IU]/mL (ref 2.6–18.5)

## 2017-04-10 NOTE — Progress Notes (Signed)
[facility] New Visit:  Assessment: Erythrocytosis 83 year old female presenting for evaluation of elevated hemoglobin count. Normal white blood cell count and platelet count. There are some symptoms with dyspnea on exertion and fatigue. Patient has history of secondhand smoking exposure, but no personal history of tobacco smoking or respiratory disease. In this setting, we may be dealing with a primary uterus cytosis due to polycythemia vera versus secondary erythrocytosis due to pulmonary disease or extra renal erythropoietin production. Will start with a basic lab work and will review with the patient in 1 week.  Plan: --Labs today as outlined below --Return to clinic in 1 week to review the findings.  Voice recognition software was used and creation of this note. Despite my best effort at editing the text, some misspelling/errors may have occurred.   Orders Placed This Encounter  Procedures  . CBC & Diff and Retic    Standing Status:   Future    Number of Occurrences:   1    Standing Expiration Date:   04/09/2018  . Morphology    Standing Status:   Future    Number of Occurrences:   1    Standing Expiration Date:   04/09/2018  . Comprehensive metabolic panel    Standing Status:   Future    Number of Occurrences:   1    Standing Expiration Date:   04/09/2018  . Lactate dehydrogenase (LDH)    Standing Status:   Future    Number of Occurrences:   1    Standing Expiration Date:   04/09/2018  . Erythropoietin    Standing Status:   Future    Number of Occurrences:   1    Standing Expiration Date:   04/09/2018    All questions were answered.  . The patient knows to call the clinic with any problems, questions or concerns.  This note was electronically signed.    History of Presenting Illness NEETI KNUDTSON 83 y.o. presenting to the [facility] for Elevated hemoglobin. Patient is reasonably healthy. She does have a history of colon cancer 9 years ago and has a  family history of colon cancer in her father. She has no history of first and smoking, but does have extensive history of secondhand smoking. She does complain of fatigue since 2016 which was previously attributed to cardiac arrhythmia. Presently, has dyspnea with exertion that she attributes to effects of metoprolol. Denies fever, chills or night sweats. No early satiety, nausea, vomiting, or abdominal pain. No skin rashes  Oncological/hematological History: --Labs, 01/15/17: WBC 5.8, Hgb 16.8, Hct 50.1, MCV 90.6, MCH 30.4, RDW 13.7, Plt 191;    Medical History: Past Medical History:  Diagnosis Date  . Abnormal nuclear stress test    mild anterolateral septal and inferior ischemia  . Arthritis   . Atherosclerosis of lower extremity with claudication ([place]) 04/17/2015   LEFT LEG  . Atrial fibrillation ([place])   . Claudication ([place])   . Colon cancer ([place])   . Coronary atherosclerosis of unspecified type of vessel, native or graft   . Esophageal stricture   . Fatty liver 11/05/11  . Hiatal hernia   . HLD (hyperlipidemia)   . Hypertension   . Iron deficiency anemia, unspecified   . Ischemic colitis ([place])   . Neuropathy   . Osteoporosis   . PAF (paroxysmal atrial fibrillation) ([place]) 04/2015  . Unspecified vascular insufficiency of intestine     Surgical History: Past Surgical History:  Procedure Laterality Date  .  abdominoperineal resection anastomotic stricture  05/2007  . APPENDECTOMY    . CARDIAC CATHETERIZATION N/A 03/13/2015   Procedure: Left Heart Cath and Coronary Angiography;  Surgeon: Troy Sine, MD;  Location: [facility];  Service: Cardiovascular;  Laterality: N/A;  . CARDIOVERSION N/A 04/20/2015   Procedure: CARDIOVERSION;  Surgeon: Jerline Pain, MD;  Location: [facility];  Service: Cardiovascular;  Laterality: N/A;  . [place]  . ESOPHAGOGASTRODUODENOSCOPY N/A 04/27/2014   Procedure: ESOPHAGOGASTRODUODENOSCOPY (EGD);  Surgeon: Lafayette Dragon, MD;  Location: [facility];  Service: Endoscopy;  Laterality: N/A;  . Mandibular Renstruction    . ROTATOR CUFF REPAIR  2006   left  . Rt. Salpingo oophorectomy and cyst removal  2005  . SAVORY DILATION N/A 04/27/2014   Procedure: SAVORY DILATION;  Surgeon: Lafayette Dragon, MD;  Location: [facility];  Service: Endoscopy;  Laterality: N/A;  no xray needed  . sigmoid resection for invasive rectal adenocarcinoma  12/2006  . TEE WITHOUT CARDIOVERSION N/A 04/20/2015   Procedure: TRANSESOPHAGEAL ECHOCARDIOGRAM (TEE);  Surgeon: Jerline Pain, MD;  Location: [facility];  Service: Cardiovascular;  Laterality: N/A;  . TUBAL LIGATION    . WRIST SURGERY  2008   right    Family History: Family History  Problem Relation Age of Onset  . Colon cancer Father 67  . Heart disease Father   . Hypertension Father   . Alzheimer's disease Mother   . Thyroid disease Mother   . Hypertension Sister   . Thyroid disease Sister   . Mitral valve prolapse Sister   . Hypertension Son   . Diabetes Son   . Hypertension Son   . Diabetes Son   . Esophageal cancer Neg Hx   . Rectal cancer Neg Hx   . Stomach cancer Neg Hx     Social History: Social History   Social History  . Marital status: Widowed    Spouse name: N/A  . Number of children: 4  . Years of education: 12th grade   Occupational History  . retired Retired   Social History Main Topics  . Smoking status: Never Smoker  . Smokeless tobacco: Never Used  . Alcohol use No  . Drug use: No  . Sexual activity: Not Currently   Other Topics Concern  . Not on file   Social History Narrative   Lives at home with fiance.   Right-handed.   No caffeine use.    Allergies: Allergies  Allergen Reactions  . Crestor [Rosuvastatin Calcium] Other (See Comments)    Muscle aches  . Acetaminophen-Codeine   . Amlodipine Besylate     REACTION: swelling  . Diltiazem Hcl     REACTION: rash  . Hydrocodone-Acetaminophen     nausea  .  Irbesartan-Hydrochlorothiazide     REACTION: Dizziness  . Lisinopril     cough  . Omeprazole Nausea Only and Other (See Comments)    Dizziness, joint pain, weakness in legs, cramps in legs, stomach burned  . Propoxyphene N-Acetaminophen     Headache, and blotchy face  . Valsartan     REACTION: blurred vision  . Warfarin Sodium     REACTION: body aches and bleeding  . Verapamil Nausea Only, Palpitations and Other (See Comments)    Headaches     Medications:  Current Outpatient Prescriptions  Medication Sig Dispense Refill  . apixaban (ELIQUIS) 5 MG TABS tablet Take 1 tablet (5 mg total) by mouth 2 (two) times  daily. 180 tablet 3  . Calcium Carbonate-Vitamin D (CALCIUM 600 + D PO) Take 2 tablets by mouth daily.    . chlorthalidone (HYGROTON) 25 MG tablet Take 1 tablet (25 mg total) by mouth daily. 90 tablet 1  . cyanocobalamin 2000 MCG tablet Take 1 tablet (2,000 mcg total) by mouth daily. 90 tablet 3  . Dietary Management Product (FOSTEUM PLUS) CAPS Take 1 capsule by mouth 2 (two) times daily. 60 capsule 11  . gabapentin (NEURONTIN) 300 MG capsule Take 1 capsule (300 mg total) by mouth 3 (three) times daily. (Patient taking differently: Take 300 mg by mouth at bedtime. ) 90 capsule 11  . metoprolol (LOPRESSOR) 50 MG tablet Take 0.5 tablets (25 mg total) by mouth 2 (two) times daily. (Patient taking differently: Take 50 mg by mouth 2 (two) times daily. 1 tab in am and 0.5 tab at night) 180 tablet 3  . potassium chloride (K-DUR) 10 MEQ tablet Take 1 tablet on the days that you take the furosemide. (Patient taking differently: Take 10 mEq by mouth every other day. ) 90 tablet 3  . ranitidine (ZANTAC) 300 MG tablet Take 1 tablet (300 mg total) by mouth every morning. 90 tablet 3  . risedronate (ACTONEL) 35 MG tablet Take 1 tablet (35 mg total) by mouth every 7 (seven) days. with water on empty stomach, nothing by mouth or lie down for next 30 minutes. 12 tablet 3  . sotalol (BETAPACE) 80 MG  tablet Take 1 tablet (80 mg total) by mouth every 12 (twelve) hours. 180 tablet 3   No current facility-administered medications for this visit.     Review of Systems: Review of Systems  All other systems reviewed and are negative.    PHYSICAL EXAMINATION Blood pressure (!) 153/71, pulse 60, temperature 97.8 F (36.6 C), temperature source Oral, resp. rate 16, height 5' 6"  (1.676 m), weight 200 lb 11.2 oz (91 kg), SpO2 97 %.  ECOG PERFORMANCE STATUS: 1 - Symptomatic but completely ambulatory  Physical Exam  Constitutional: She is oriented to person, place, and time and well-developed, well-nourished, and in no distress. No distress.  HENT:  Head: Normocephalic and atraumatic.  Mouth/Throat: Oropharynx is clear and moist. No oropharyngeal exudate.  Eyes: Pupils are equal, round, and reactive to light. EOM are normal. No scleral icterus.  Neck: Normal range of motion. Neck supple. No thyromegaly present.  Cardiovascular: Normal rate and regular rhythm.   No murmur heard. Pulmonary/Chest: Effort normal and breath sounds normal. No respiratory distress. She has no wheezes. She has no rales.  Abdominal: Soft. Bowel sounds are normal. She exhibits no distension and no mass. There is no tenderness. There is no rebound and no guarding.  Musculoskeletal: Normal range of motion. She exhibits no edema or tenderness.  Lymphadenopathy:    She has no cervical adenopathy.  Neurological: She is alert and oriented to person, place, and time. She has normal reflexes.  Skin: Skin is warm. No rash noted. She is not diaphoretic. No erythema. No pallor.     LABORATORY DATA: I have personally reviewed the data as listed: Appointment on 04/09/2017  Component Date Value Ref Range Status  . WBC 04/09/2017 4.6  3.9 - 10.3 10e3/uL Final  . NEUT# 04/09/2017 2.0  1.5 - 6.5 10e3/uL Final  . HGB 04/09/2017 15.0  11.6 - 15.9 g/dL Final  . HCT 04/09/2017 45.1  34.8 - 46.6 % Final  . Platelets 04/09/2017 166   145 - 400 10e3/uL Final  .  MCV 04/09/2017 92.0  79.5 - 101.0 fL Final  . MCH 04/09/2017 30.6  25.1 - 34.0 pg Final  . MCHC 04/09/2017 33.3  31.5 - 36.0 g/dL Final  . RBC 04/09/2017 4.90  3.70 - 5.45 10e6/uL Final  . RDW 04/09/2017 13.7  11.2 - 14.5 % Final  . lymph# 04/09/2017 1.9  0.9 - 3.3 10e3/uL Final  . MONO# 04/09/2017 0.5  0.1 - 0.9 10e3/uL Final  . Eosinophils Absolute 04/09/2017 0.2  0.0 - 0.5 10e3/uL Final  . Basophils Absolute 04/09/2017 0.0  0.0 - 0.1 10e3/uL Final  . NEUT% 04/09/2017 43.8  38.4 - 76.8 % Final  . LYMPH% 04/09/2017 40.7  14.0 - 49.7 % Final  . MONO% 04/09/2017 10.5  0.0 - 14.0 % Final  . EOS% 04/09/2017 4.6  0.0 - 7.0 % Final  . BASO% 04/09/2017 0.4  0.0 - 2.0 % Final  . Retic % 04/09/2017 1.52  0.70 - 2.10 % Final  . Retic Ct Abs 04/09/2017 74.48  33.70 - 90.70 10e3/uL Final  . Immature Retic Fract 04/09/2017 6.00  1.60 - 10.00 % Final  . RBC Comments 04/09/2017 Within Normal Limits  Within Normal Limits Final  . White Cell Comments 04/09/2017 C/W auto diff   Final  . PLT EST 04/09/2017 Adequate  Adequate Final  . Sodium 04/09/2017 143  136 - 145 mEq/L Final  . Potassium 04/09/2017 3.5  3.5 - 5.1 mEq/L Final  . Chloride 04/09/2017 103  98 - 109 mEq/L Final  . CO2 04/09/2017 31* 22 - 29 mEq/L Final  . Glucose 04/09/2017 93  70 - 140 mg/dl Final   Glucose reference range is for nonfasting patients. Fasting glucose reference range is 70- 100.  Marland Kitchen BUN 04/09/2017 16.5  7.0 - 26.0 mg/dL Final  . Creatinine 04/09/2017 0.8  0.6 - 1.1 mg/dL Final  . Total Bilirubin 04/09/2017 1.21* 0.20 - 1.20 mg/dL Final  . Alkaline Phosphatase 04/09/2017 57  40 - 150 U/L Final  . AST 04/09/2017 26  5 - 34 U/L Final  . ALT 04/09/2017 21  0 - 55 U/L Final  . Total Protein 04/09/2017 7.2  6.4 - 8.3 g/dL Final  . Albumin 04/09/2017 3.9  3.5 - 5.0 g/dL Final  . Calcium 04/09/2017 9.9  8.4 - 10.4 mg/dL Final  . Anion Gap 04/09/2017 8  3 - 11 mEq/L Final  . EGFR 04/09/2017 65* >90  ml/min/1.73 m2 Final   eGFR is calculated using the CKD-EPI Creatinine Equation (2009)  . LDH 04/09/2017 207  125 - 245 U/L Final  . Erythropoietin 04/09/2017 14.5  2.6 - 18.5 mIU/mL Final   [employer] Coul[employer] Uni[employer] DxI 800 Immunoassay System         Ardath Sax, MD

## 2017-04-10 NOTE — Assessment & Plan Note (Signed)
83 year old female presenting for evaluation of elevated hemoglobin count. Normal white blood cell count and platelet count. There are some symptoms with dyspnea on exertion and fatigue. Patient has history of secondhand smoking exposure, but no personal history of tobacco smoking or respiratory disease. In this setting, we may be dealing with a primary uterus cytosis due to polycythemia vera versus secondary erythrocytosis due to pulmonary disease or extra renal erythropoietin production. Will start with a basic lab work and will review with the patient in 1 week.  Plan: --Labs today as outlined below --Return to clinic in 1 week to review the findings.  Voice recognition software was used and creation of this note. Despite my best effort at editing the text, some misspelling/errors may have occurred.

## 2017-04-16 ENCOUNTER — Ambulatory Visit (HOSPITAL_BASED_OUTPATIENT_CLINIC_OR_DEPARTMENT_OTHER): Payer: Medicare HMO | Admitting: Hematology and Oncology

## 2017-04-16 VITALS — BP 157/74 | HR 63 | Temp 98.7°F | Resp 19 | Ht 66.0 in | Wt 200.8 lb

## 2017-04-16 DIAGNOSIS — Z85048 Personal history of other malignant neoplasm of rectum, rectosigmoid junction, and anus: Secondary | ICD-10-CM

## 2017-04-16 DIAGNOSIS — Z800 Family history of malignant neoplasm of digestive organs: Secondary | ICD-10-CM | POA: Diagnosis not present

## 2017-04-16 DIAGNOSIS — D751 Secondary polycythemia: Secondary | ICD-10-CM

## 2017-04-17 NOTE — Progress Notes (Signed)
[facility] Follow-up Visit:  Assessment: Erythrocytosis 83 year old female presenting for evaluation of elevated hemoglobin count. Normal white blood cell count and platelet count. There are some symptoms with dyspnea on exertion and fatigue. Patient has history of secondhand smoking exposure, but no personal history of tobacco smoking or respiratory disease. Repeat blood work obtained in our clinic demonstrates completely normal complete blood count with no evidence of elevation of hemoglobin, in addition to that, erythropoietin level is normal and LDH is not elevated making presence of a primary hematological abnormality highly unlikely.  Plan: --Repeat CBC by PCP in 47month --RTC with Hematology PRN  Voice recognition software was used and creation of this note. Despite my best effort at editing the text, some misspelling/errors may have occurred.   No orders of the defined types were placed in this encounter.   Cancer Staging Rectal cancer ([facility] Staging form: Colon and Rectum, AJCC 7th Edition - Pathologic: Stage I (T1, N0, cM0) - Signed by EVolanda Napoleon MD on 10/01/2011   All questions were answered.  . The patient knows to call the clinic with any problems, questions or concerns.  This note was electronically signed.    History of Presenting Illness Holly FLORANCEis an 83y.o. female followed in the [facility] elevated hemoglobin. Patient is reasonably healthy. She does have a history of colon cancer 9 years ago and has a family history of colon cancer in her father. She has no history of first and smoking, but does have extensive history of secondhand smoking. She does complain of fatigue since 2016 which was previously attributed to cardiac arrhythmia. Presently, has dyspnea with exertion that she attributes to effects of metoprolol. Denies fever, chills or night sweats. No early satiety, nausea, vomiting, or abdominal pain. No skin  rashes  Patient returns to the clinic to review results of the lab work obtained at the last visit to our clinic. Please see the abbreviated results below.  Oncological/hematological History: --Labs, 01/15/17: WBC 5.8, Hgb 16.8, Hct 50.1, MCV 90.6, MCH 30.4, RDW 13.7, Plt 191;  --Labs, 04/09/17: WBC 4.6, Hgb 15.0, Plt 166; Epo 14.5, LDH 207   Medical History: Past Medical History:  Diagnosis Date  . Abnormal nuclear stress test    mild anterolateral septal and inferior ischemia  . Arthritis   . Atherosclerosis of lower extremity with claudication ([place] 04/17/2015   LEFT LEG  . Atrial fibrillation ([facility]   . Claudication ([facility]   . Colon cancer ([facility]   . Coronary atherosclerosis of unspecified type of vessel, native or graft   . Esophageal stricture   . Fatty liver 11/05/11  . Hiatal hernia   . HLD (hyperlipidemia)   . Hypertension   . Iron deficiency anemia, unspecified   . Ischemic colitis ([place]   . Neuropathy   . Osteoporosis   . PAF (paroxysmal atrial fibrillation) ([facility] 04/2015  . Unspecified vascular insufficiency of intestine     Surgical History: Past Surgical History:  Procedure Laterality Date  . abdominoperineal resection anastomotic stricture  05/2007  . APPENDECTOMY    . CARDIAC CATHETERIZATION N/A 03/13/2015   Procedure: Left Heart Cath and Coronary Angiography;  Surgeon: TTroy Sine MD;  [facility];  Service: Cardiovascular;  Laterality: N/A;  . CARDIOVERSION N/A 04/20/2015   Procedure: CARDIOVERSION;  Surgeon: MJerline Pain MD;  [place] [facility]  Service: Cardiovascular;  Laterality: N/A;  . [place] . ESOPHAGOGASTRODUODENOSCOPY  N/A 04/27/2014   Procedure: ESOPHAGOGASTRODUODENOSCOPY (EGD);  Surgeon: Lafayette Dragon, MD;  [facility];  Service: Endoscopy;  Laterality: N/A;  . Mandibular Renstruction    . ROTATOR CUFF REPAIR  2006   left  . Rt. Salpingo oophorectomy and cyst removal  2005  .  SAVORY DILATION N/A 04/27/2014   Procedure: SAVORY DILATION;  Surgeon: Lafayette Dragon, MD;  [facility];  Service: Endoscopy;  Laterality: N/A;  no xray needed  . sigmoid resection for invasive rectal adenocarcinoma  12/2006  . TEE WITHOUT CARDIOVERSION N/A 04/20/2015   Procedure: TRANSESOPHAGEAL ECHOCARDIOGRAM (TEE);  Surgeon: Jerline Pain, MD;  Location: [facility];  Service: Cardiovascular;  Laterality: N/A;  . TUBAL LIGATION    . WRIST SURGERY  2008   right    Family History: Family History  Problem Relation Age of Onset  . Colon cancer Father 58  . Heart disease Father   . Hypertension Father   . Alzheimer's disease Mother   . Thyroid disease Mother   . Hypertension Sister   . Thyroid disease Sister   . Mitral valve prolapse Sister   . Hypertension Son   . Diabetes Son   . Hypertension Son   . Diabetes Son   . Esophageal cancer Neg Hx   . Rectal cancer Neg Hx   . Stomach cancer Neg Hx     Social History: Social History   Social History  . Marital status: Widowed    Spouse name: N/A  . Number of children: 4  . Years of education: 12th grade   Occupational History  . retired Retired   Social History Main Topics  . Smoking status: Never Smoker  . Smokeless tobacco: Never Used  . Alcohol use No  . Drug use: No  . Sexual activity: Not Currently   Other Topics Concern  . Not on file   Social History Narrative   Lives at home with fiance.   Right-handed.   No caffeine use.    Allergies: Allergies  Allergen Reactions  . Crestor [Rosuvastatin Calcium] Other (See Comments)    Muscle aches  . Acetaminophen-Codeine   . Amlodipine Besylate     REACTION: swelling  . Diltiazem Hcl     REACTION: rash  . Hydrocodone-Acetaminophen     nausea  . Irbesartan-Hydrochlorothiazide     REACTION: Dizziness  . Lisinopril     cough  . Omeprazole Nausea Only and Other (See Comments)    Dizziness, joint pain, weakness in legs, cramps in legs, stomach burned   . Propoxyphene N-Acetaminophen     Headache, and blotchy face  . Valsartan     REACTION: blurred vision  . Warfarin Sodium     REACTION: body aches and bleeding  . Verapamil Nausea Only, Palpitations and Other (See Comments)    Headaches     Medications:  Current Outpatient Prescriptions  Medication Sig Dispense Refill  . apixaban (ELIQUIS) 5 MG TABS tablet Take 1 tablet (5 mg total) by mouth 2 (two) times daily. 180 tablet 3  . Calcium Carbonate-Vitamin D (CALCIUM 600 + D PO) Take 2 tablets by mouth daily.    . chlorthalidone (HYGROTON) 25 MG tablet Take 1 tablet (25 mg total) by mouth daily. 90 tablet 1  . cyanocobalamin 2000 MCG tablet Take 1 tablet (2,000 mcg total) by mouth daily. 90 tablet 3  . Dietary Management Product (FOSTEUM PLUS) CAPS Take 1 capsule by mouth 2 (two) times daily. 60 capsule 11  .  gabapentin (NEURONTIN) 300 MG capsule Take 1 capsule (300 mg total) by mouth 3 (three) times daily. (Patient taking differently: Take 300 mg by mouth at bedtime. ) 90 capsule 11  . metoprolol (LOPRESSOR) 50 MG tablet Take 0.5 tablets (25 mg total) by mouth 2 (two) times daily. (Patient taking differently: Take 50 mg by mouth 2 (two) times daily. 1 tab in am and 0.5 tab at night) 180 tablet 3  . potassium chloride (K-DUR) 10 MEQ tablet Take 1 tablet on the days that you take the furosemide. (Patient taking differently: Take 10 mEq by mouth every other day. ) 90 tablet 3  . ranitidine (ZANTAC) 300 MG tablet Take 1 tablet (300 mg total) by mouth every morning. 90 tablet 3  . sotalol (BETAPACE) 80 MG tablet Take 1 tablet (80 mg total) by mouth every 12 (twelve) hours. 180 tablet 3   No current facility-administered medications for this visit.     Review of Systems: Review of Systems  All other systems reviewed and are negative.    PHYSICAL EXAMINATION Blood pressure (!) 157/74, pulse 63, temperature 98.7 F (37.1 C), temperature source Oral, resp. rate 19, height 5' 6"  (1.676 m),  weight 200 lb 12.8 oz (91.1 kg), SpO2 98 %.  ECOG PERFORMANCE STATUS: 0 - Asymptomatic  Physical Exam  Constitutional: She is oriented to person, place, and time and well-developed, well-nourished, and in no distress. No distress.  HENT:  Head: Normocephalic and atraumatic.  Mouth/Throat: Oropharynx is clear and moist. No oropharyngeal exudate.  Eyes: Pupils are equal, round, and reactive to light. EOM are normal. No scleral icterus.  Neck: Normal range of motion. Neck supple. No thyromegaly present.  Cardiovascular: Normal rate and regular rhythm.   No murmur heard. Pulmonary/Chest: Effort normal and breath sounds normal. No respiratory distress. She has no wheezes. She has no rales.  Abdominal: Soft. Bowel sounds are normal. She exhibits no distension and no mass. There is no tenderness. There is no rebound and no guarding.  Musculoskeletal: Normal range of motion. She exhibits no edema or tenderness.  Lymphadenopathy:    She has no cervical adenopathy.  Neurological: She is alert and oriented to person, place, and time. She has normal reflexes.  Skin: Skin is warm. No rash noted. She is not diaphoretic. No erythema. No pallor.     LABORATORY DATA: I have personally reviewed the data as listed: No visits with results within 1 Week(s) from this visit.  Latest known visit with results is:  Appointment on 04/09/2017  Component Date Value Ref Range Status  . WBC 04/09/2017 4.6  3.9 - 10.3 10e3/uL Final  . NEUT# 04/09/2017 2.0  1.5 - 6.5 10e3/uL Final  . HGB 04/09/2017 15.0  11.6 - 15.9 g/dL Final  . HCT 04/09/2017 45.1  34.8 - 46.6 % Final  . Platelets 04/09/2017 166  145 - 400 10e3/uL Final  . MCV 04/09/2017 92.0  79.5 - 101.0 fL Final  . MCH 04/09/2017 30.6  25.1 - 34.0 pg Final  . MCHC 04/09/2017 33.3  31.5 - 36.0 g/dL Final  . RBC 04/09/2017 4.90  3.70 - 5.45 10e6/uL Final  . RDW 04/09/2017 13.7  11.2 - 14.5 % Final  . lymph# 04/09/2017 1.9  0.9 - 3.3 10e3/uL Final  . MONO#  04/09/2017 0.5  0.1 - 0.9 10e3/uL Final  . Eosinophils Absolute 04/09/2017 0.2  0.0 - 0.5 10e3/uL Final  . Basophils Absolute 04/09/2017 0.0  0.0 - 0.1 10e3/uL Final  . NEUT% 04/09/2017  43.8  38.4 - 76.8 % Final  . LYMPH% 04/09/2017 40.7  14.0 - 49.7 % Final  . MONO% 04/09/2017 10.5  0.0 - 14.0 % Final  . EOS% 04/09/2017 4.6  0.0 - 7.0 % Final  . BASO% 04/09/2017 0.4  0.0 - 2.0 % Final  . Retic % 04/09/2017 1.52  0.70 - 2.10 % Final  . Retic Ct Abs 04/09/2017 74.48  33.70 - 90.70 10e3/uL Final  . Immature Retic Fract 04/09/2017 6.00  1.60 - 10.00 % Final  . RBC Comments 04/09/2017 Within Normal Limits  Within Normal Limits Final  . White Cell Comments 04/09/2017 C/W auto diff   Final  . PLT EST 04/09/2017 Adequate  Adequate Final  . Sodium 04/09/2017 143  136 - 145 mEq/L Final  . Potassium 04/09/2017 3.5  3.5 - 5.1 mEq/L Final  . Chloride 04/09/2017 103  98 - 109 mEq/L Final  . CO2 04/09/2017 31* 22 - 29 mEq/L Final  . Glucose 04/09/2017 93  70 - 140 mg/dl Final   Glucose reference range is for nonfasting patients. Fasting glucose reference range is 70- 100.  Marland Kitchen BUN 04/09/2017 16.5  7.0 - 26.0 mg/dL Final  . Creatinine 04/09/2017 0.8  0.6 - 1.1 mg/dL Final  . Total Bilirubin 04/09/2017 1.21* 0.20 - 1.20 mg/dL Final  . Alkaline Phosphatase 04/09/2017 57  40 - 150 U/L Final  . AST 04/09/2017 26  5 - 34 U/L Final  . ALT 04/09/2017 21  0 - 55 U/L Final  . Total Protein 04/09/2017 7.2  6.4 - 8.3 g/dL Final  . Albumin 04/09/2017 3.9  3.5 - 5.0 g/dL Final  . Calcium 04/09/2017 9.9  8.4 - 10.4 mg/dL Final  . Anion Gap 04/09/2017 8  3 - 11 mEq/L Final  . EGFR 04/09/2017 65* >90 ml/min/1.73 m2 Final   eGFR is calculated using the CKD-EPI Creatinine Equation (2009)  . LDH 04/09/2017 207  125 - 245 U/L Final  . Erythropoietin 04/09/2017 14.5  2.6 - 18.5 mIU/mL Final   Beck[employer] Coul[employer] Uni[employer] DxI 800 Immunoassay System       Ardath Sax, MD

## 2017-04-17 NOTE — Assessment & Plan Note (Signed)
83 year old female presenting for evaluation of elevated hemoglobin count. Normal white blood cell count and platelet count. There are some symptoms with dyspnea on exertion and fatigue. Patient has history of secondhand smoking exposure, but no personal history of tobacco smoking or respiratory disease. Repeat blood work obtained in our clinic demonstrates completely normal complete blood count with no evidence of elevation of hemoglobin, in addition to that, erythropoietin level is normal and LDH is not elevated making presence of a primary hematological abnormality highly unlikely.  Plan: --Repeat CBC by PCP in 40months --RTC with Hematology PRN  Voice recognition software was used and creation of this note. Despite my best effort at editing the text, some misspelling/errors may have occurred.

## 2017-05-05 ENCOUNTER — Ambulatory Visit: Payer: Medicare HMO | Admitting: Cardiovascular Disease

## 2017-06-01 ENCOUNTER — Ambulatory Visit: Payer: Medicare HMO | Admitting: Neurology

## 2017-06-23 ENCOUNTER — Encounter: Payer: Self-pay | Admitting: Cardiovascular Disease

## 2017-06-23 ENCOUNTER — Ambulatory Visit (INDEPENDENT_AMBULATORY_CARE_PROVIDER_SITE_OTHER): Payer: Medicare HMO | Admitting: Cardiovascular Disease

## 2017-06-23 VITALS — BP 124/66 | HR 62 | Ht 66.0 in | Wt 201.0 lb

## 2017-06-23 DIAGNOSIS — I480 Paroxysmal atrial fibrillation: Secondary | ICD-10-CM | POA: Diagnosis not present

## 2017-06-23 DIAGNOSIS — I10 Essential (primary) hypertension: Secondary | ICD-10-CM

## 2017-06-23 DIAGNOSIS — I5189 Other ill-defined heart diseases: Secondary | ICD-10-CM

## 2017-06-23 DIAGNOSIS — I4891 Unspecified atrial fibrillation: Secondary | ICD-10-CM

## 2017-06-23 DIAGNOSIS — Z7901 Long term (current) use of anticoagulants: Secondary | ICD-10-CM

## 2017-06-23 DIAGNOSIS — G609 Hereditary and idiopathic neuropathy, unspecified: Secondary | ICD-10-CM | POA: Diagnosis not present

## 2017-06-23 DIAGNOSIS — I519 Heart disease, unspecified: Secondary | ICD-10-CM | POA: Diagnosis not present

## 2017-06-23 NOTE — Patient Instructions (Signed)
Medication Instructions:  Continue current medications  If you need a refill on your cardiac medications before your next appointment, please call your pharmacy.  Labwork: None Ordered   Testing/Procedures: None Ordered   Follow-Up: Your physician wants you to follow-up in: 6 Months. You should receive a reminder letter in the mail two months in advance. If you do not receive a letter, please call our office [phone].   Thank you for choosing [facility]!!

## 2017-06-23 NOTE — Progress Notes (Signed)
Patient ID: CHERYLL KEISLER, female   DOB: 12/19/1932, 83 y.o.   MRN: [id number]    PCP: Dr. Eilleen Kempf  HPI: ALDINE CHAKRABORTY is a 83 y.o. female who presents for an 23 month cardiology evaluation.  Ms. Atiyeh has a history of paroxysmal atrial fibrillation, hypertension, as well as hyperlipidemia. Remotely, she developed myalgias with simvastatin and had not been willing to try additional lipid lowering therapy. In August 2012 an echo Doppler study showed mild asymmetric LVH with proximal septal thickening without LVOT obstruction. She had grade 1 diastolic dysfunction with normal systolic function, mild MR, mild TR, and aortic valve sclerosis with mild MR.   In December 2014 follow-up blood work showed normal renal function with a BUN of 19, creatinine 0.8.  She continued to have significant hyperlipidemia with a total cholesterol of 250, triglycerides 221 HDL 53, VLDL 44 and LDL cholesterol 153.  TSH was normal at 2.275.   Ms. Larusso  had noticed development of claudication, particularly involving her left leg with activity.  She had episodes of shortness of breath and fatigue.  She  underwent a lower extremity arterial Doppler study which was abnormal and showed an ABI of 0.63 on the left and 1.0 on the right.  The left common iliac, external iliac, common femoral artery and profunda demonstrated multiphasic flow.  However, the superficial femoral artery on the left demonstrated occlusive disease in the proximal to mid thigh with reconstitution of flow in the mid distal segment.  The popliteal artery demonstrated patent monophasic flow with three-vessel runoff.  The anterior tibial artery demonstrated focal stenosis in the proximal calf with dampened flow distal to this.  She presented to [facility] hospital in August 2016 with complaints of palpitations, shortness of breath and presyncope.  She was noted to be in atrial fibrillation with rapid ventricular response.  Her heart rate was initially attempted  to be controlled with metoprolol and digitoxin, which was not successful and ultimately sotalol was started.  On 04/20/2015 she underwent successful TEE guided cardioversion.  She was continued on metoprolol as well as eliquis for anticoagulation.  An echo Doppler study on 04/18/2015 showed an EF of 55-60% with mild LVH.  She has multiple allergies and in the past did not tolerate ACE-inhibition or ARB therapy has been able to tolerate direct renin inhibition.  She has a history of GERD and has been taking ranitidine and sucralfate.  She has a history of hyperlipidemia and has had issues with statins.  When I saw her in October 2017 she was maintaining sinus rhythm.  She  presented to the emergency room on 01/15/2017 with chest tightness, shortness of breath and palpitations.  She was found to be back in atrial fibrillation.  She has continued to take anticoagulation with eloquence 5 mg twice a day.  During that evaluation, she underwent successful cardioversion and sinus rhythm was restored with 200 J cardioversion.   When I last saw her in May 2018, she was maintaining sinus rhythm.  I discussed the possibility of sleep apnea in the etiology of her recurrent AF but she did not want to pursue a sleep evaluation.  She underwent a follow-up echo Doppler study on 02/06/2017 which showed an EF of 60-65%.  There was moderate LVH, grade 2 diastolic dysfunction, and she was without major valvular abnormalities.  Over the last several months, she feels well.  She denies recurrent chest pain or palpitations.  There is some mild shortness of breath with activity.  She  will be seeing her primary physician later in the week who will be checking laboratory.  She presents for reevaluation.  Past Medical History:  Diagnosis Date  . Abnormal nuclear stress test    mild anterolateral septal and inferior ischemia  . Arthritis   . Atherosclerosis of lower extremity with claudication ([place]) 04/17/2015   LEFT LEG  . Atrial  fibrillation ([place])   . Claudication ([place])   . Colon cancer ([place])   . Coronary atherosclerosis of unspecified type of vessel, native or graft   . Esophageal stricture   . Fatty liver 11/05/11  . Hiatal hernia   . HLD (hyperlipidemia)   . Hypertension   . Iron deficiency anemia, unspecified   . Ischemic colitis ([place])   . Neuropathy   . Osteoporosis   . PAF (paroxysmal atrial fibrillation) ([place]) 04/2015  . Unspecified vascular insufficiency of intestine     Past Surgical History:  Procedure Laterality Date  . abdominoperineal resection anastomotic stricture  05/2007  . APPENDECTOMY    . CARDIAC CATHETERIZATION N/A 03/13/2015   Procedure: Left Heart Cath and Coronary Angiography;  Surgeon: Troy Sine, MD;  Location: [facility];  Service: Cardiovascular;  Laterality: N/A;  . CARDIOVERSION N/A 04/20/2015   Procedure: CARDIOVERSION;  Surgeon: Jerline Pain, MD;  Location: [facility];  Service: Cardiovascular;  Laterality: N/A;  . [place]  . ESOPHAGOGASTRODUODENOSCOPY N/A 04/27/2014   Procedure: ESOPHAGOGASTRODUODENOSCOPY (EGD);  Surgeon: Lafayette Dragon, MD;  Location: [facility];  Service: Endoscopy;  Laterality: N/A;  . Mandibular Renstruction    . ROTATOR CUFF REPAIR  2006   left  . Rt. Salpingo oophorectomy and cyst removal  2005  . SAVORY DILATION N/A 04/27/2014   Procedure: SAVORY DILATION;  Surgeon: Lafayette Dragon, MD;  Location: [facility];  Service: Endoscopy;  Laterality: N/A;  no xray needed  . sigmoid resection for invasive rectal adenocarcinoma  12/2006  . TEE WITHOUT CARDIOVERSION N/A 04/20/2015   Procedure: TRANSESOPHAGEAL ECHOCARDIOGRAM (TEE);  Surgeon: Jerline Pain, MD;  Location: [facility];  Service: Cardiovascular;  Laterality: N/A;  . TUBAL LIGATION    . WRIST SURGERY  2008   right    Allergies  Allergen Reactions  . Crestor [Rosuvastatin Calcium] Other (See Comments)    Muscle aches  . Acetaminophen-Codeine   .  Amlodipine Besylate     REACTION: swelling  . Diltiazem Hcl     REACTION: rash  . Hydrocodone-Acetaminophen     nausea  . Irbesartan-Hydrochlorothiazide     REACTION: Dizziness  . Lisinopril     cough  . Omeprazole Nausea Only and Other (See Comments)    Dizziness, joint pain, weakness in legs, cramps in legs, stomach burned  . Propoxyphene N-Acetaminophen     Headache, and blotchy face  . Valsartan     REACTION: blurred vision  . Warfarin Sodium     REACTION: body aches and bleeding  . Verapamil Nausea Only, Palpitations and Other (See Comments)    Headaches     Current Outpatient Prescriptions  Medication Sig Dispense Refill  . apixaban (ELIQUIS) 5 MG TABS tablet Take 1 tablet (5 mg total) by mouth 2 (two) times daily. 180 tablet 3  . Calcium Carbonate-Vitamin D (CALCIUM 600 + D PO) Take 2 tablets by mouth daily.    . chlorthalidone (HYGROTON) 25 MG tablet Take 1 tablet (25 mg total) by mouth daily. 90 tablet 1  . cyanocobalamin 2000 MCG tablet Take  1 tablet (2,000 mcg total) by mouth daily. 90 tablet 3  . Dietary Management Product (FOSTEUM PLUS) CAPS Take 1 capsule by mouth 2 (two) times daily. 60 capsule 11  . gabapentin (NEURONTIN) 100 MG capsule Take 100 mg by mouth 3 (three) times daily.    . metoprolol tartrate (LOPRESSOR) 50 MG tablet Take 50 mg by mouth. Patient states she takes 1 tab in the morning and half tab in the evening Daily    . potassium chloride (K-DUR) 10 MEQ tablet Take 5 mEq by mouth daily.    . ranitidine (ZANTAC) 300 MG tablet Take 1 tablet (300 mg total) by mouth every morning. 90 tablet 3  . sotalol (BETAPACE) 80 MG tablet Take 1 tablet (80 mg total) by mouth every 12 (twelve) hours. 180 tablet 3   No current facility-administered medications for this visit.     Social History   Social History  . Marital status: Widowed    Spouse name: N/A  . Number of children: 4  . Years of education: 12th grade   Occupational History  . retired Retired     Social History Main Topics  . Smoking status: Never Smoker  . Smokeless tobacco: Never Used  . Alcohol use No  . Drug use: No  . Sexual activity: Not Currently   Other Topics Concern  . Not on file   Social History Narrative   Lives at home with fiance.   Right-handed.   No caffeine use.    Family History  Problem Relation Age of Onset  . Colon cancer Father 11  . Heart disease Father   . Hypertension Father   . Alzheimer's disease Mother   . Thyroid disease Mother   . Hypertension Sister   . Thyroid disease Sister   . Mitral valve prolapse Sister   . Hypertension Son   . Diabetes Son   . Hypertension Son   . Diabetes Son   . Esophageal cancer Neg Hx   . Rectal cancer Neg Hx   . Stomach cancer Neg Hx    Social history  is notable that she is widowed. She has 3 children and 9 grandchildren. She remains active. There is no alcohol tobacco use.   ROS General: Negative; No fevers, chills, or night sweats; positive for fatigue and shortness of breath HEENT: Negative; No changes in vision or hearing, sinus congestion, difficulty swallowing Pulmonary: Negative; No cough, wheezing, hemoptysis Cardiovascular: Positive for recurrent AF, status post cardioversion Positive for left leg claudication GI: Positive for GERD GU: Negative; No dysuria, hematuria, or difficulty voiding Musculoskeletal: Negative; no myalgias, joint pain, or weakness Hematologic/Oncology: Negative; no easy bruising, bleeding Endocrine: Negative; no heat/cold intolerance; no diabetes Neuro: Positive for peripheral neuropathy Skin: Negative; No rashes or skin lesions Psychiatric: Negative; No behavioral problems, depression Sleep: Negative; No snoring, daytime sleepiness, hypersomnolence, bruxism, restless legs, hypnogognic hallucinations, no cataplexy Other comprehensive 14 point system review is negative.   PE BP 124/66   Pulse 62   Ht 5' 6"  (1.676 m)   Wt 201 lb (91.2 kg)   BMI 32.44 kg/m     Repeat blood pressure 132/68 supine and 124/66 standing  Wt Readings from Last 3 Encounters:  06/23/17 201 lb (91.2 kg)  04/16/17 200 lb 12.8 oz (91.1 kg)  04/09/17 200 lb 11.2 oz (91 kg)   General: Alert, oriented, no distress.  Skin: normal turgor, no rashes, warm and dry HEENT: Normocephalic, atraumatic. Pupils equal round and reactive to light; sclera anicteric;  extraocular muscles intact;  Nose without nasal septal hypertrophy Mouth/Parynx benign; Mallinpatti scale 2 Neck: No JVD, no carotid bruits; normal carotid upstroke Lungs: clear to ausculatation and percussion; no wheezing or rales Chest wall: without tenderness to palpitation Heart: PMI not displaced, RRR, s1 s2 normal, 1/6 systolic murmur, no diastolic murmur, no rubs, gallops, thrills, or heaves Abdomen: soft, nontender; no hepatosplenomehaly, BS+; abdominal aorta nontender and not dilated by palpation. Back: no CVA tenderness Pulses 2+ Musculoskeletal: full range of motion, normal strength, no joint deformities Extremities: no clubbing cyanosis or edema, Homan's sign negative  Neurologic: grossly nonfocal; Cranial nerves grossly wnl Psychologic: Normal mood and affect   ECG (independently read by me): Normal sinus rhythm at 62 bpm.  No significant ST-T changes.  No ectopy.  QTc interval 452 ms.  May 2018 ECG (independently read by me): Sinus bradycardia 59 bpm.  Low voltage.  Normal intervals.  No ST segment changes.  October 2017 ECG (independently read by me): Sinus bradycardia 57 bpm.  No ectopy.  Normal intervals.  November 2016 ECG (independently read by me): Sinus bradycardia 59 bpm.  No ectopy.  Normal intervals.  QTC 457 ms.  05/22/2015 ECG (independently read by me): Normal sinus rhythm at 60 bpm.  QTc interval 454 ms.  No significant ST segment changes.  June 2016 ECG (independently read by me): Normal sinus rhythm at 63 bpm.  Poor precordial R-wave progression.  No ectopy.  November 2015 ECG  (independently read by me);  Normal sinus rhythm at 59 bpm.  QTc interval 415 ms.  No significant ST segment changes.  Prior November 2014ECG: Sinus rhythm at 63 beats per minute. Normal intervals.  LABS: BMP Latest Ref Rng & Units 04/09/2017 01/15/2017 11/27/2016  Glucose 70 - 140 mg/dl 93 105(H) 103(H)  BUN 7.0 - 26.0 mg/dL 16.5 17 21   Creatinine 0.6 - 1.1 mg/dL 0.8 1.03(H) 0.83  Sodium 136 - 145 mEq/L 143 140 142  Potassium 3.5 - 5.1 mEq/L 3.5 3.6 4.6  Chloride 101 - 111 mmol/L - 107 106  CO2 22 - 29 mEq/L 31(H) 24 29  Calcium 8.4 - 10.4 mg/dL 9.9 9.9 9.9   Hepatic Function Latest Ref Rng & Units 04/09/2017 12/18/2016 11/27/2016  Total Protein 6.4 - 8.3 g/dL 7.2 7.1 7.3  Albumin 3.5 - 5.0 g/dL 3.9 - 4.4  AST 5 - 34 U/L 26 - 20  ALT 0 - 55 U/L 21 - 14  Alk Phosphatase 40 - 150 U/L 57 - 64  Total Bilirubin 0.20 - 1.20 mg/dL 1.21(H) - 1.0  Bilirubin, Direct 0.0 - 0.3 mg/dL - - -   CBC Latest Ref Rng & Units 04/09/2017 01/15/2017 11/27/2016  WBC 3.9 - 10.3 10e3/uL 4.6 5.8 5.1  Hemoglobin 11.6 - 15.9 g/dL 15.0 16.8(H) 15.8(H)  Hematocrit 34.8 - 46.6 % 45.1 50.1(H) 46.3(H)  Platelets 145 - 400 10e3/uL 166 191 175.0   Lab Results  Component Value Date   MCV 92.0 04/09/2017   MCV 90.6 01/15/2017   MCV 89.3 11/27/2016   Lab Results  Component Value Date   TSH 1.62 11/27/2016   Lab Results  Component Value Date   HGBA1C 5.5 12/18/2016   Lipid Panel     Component Value Date/Time   CHOL 187 08/28/2016 1009   TRIG 209 (H) 08/28/2016 1009   HDL 59 08/28/2016 1009   CHOLHDL 3.2 08/28/2016 1009   VLDL 42 (H) 08/28/2016 1009   LDLCALC 86 08/28/2016 1009   LDLDIRECT 152.3 02/10/2012 1010  RADIOLOGY: US Breast Right  07/04/2013   CLINICAL DATA:  Abnormal screening right mammogram.  EXAM: DIGITAL DIAGNOSTIC  right MAMMOGRAM  ULTRASOUND right BREAST  COMPARISON:  With prior exams.  ACR Breast Density Category b: There are scattered areas of fibroglandular density.  FINDINGS: Spot  compression views of the lateral aspect of the right breast were performed. There is persistence of a 4 mm low-density nodule in the posterior 3rd of the breast. There are no malignant type microcalcifications.  On physical exam I do not palpate a mass in the right breast.  Ultrasound is performed, showing there is a near anechoic lesion in the right breast at 7 o'clock 8 cm from the nipple measuring 3 x 2 x 3 mm.  IMPRESSION: Probable benign lesion in the right breast.  RECOMMENDATION: Short-term interval followup right mammogram and ultrasound in 6 months is recommended to document stability.  I have discussed the findings and recommendations with the patient. Results were also provided in writing at the conclusion of the visit. If applicable, a reminder letter will be sent to the patient regarding the next appointment.  BI-RADS CATEGORY  3: Probably benign finding(s) - short interval follow-up suggested.   Electronically Signed   By: Lillia Mountain M.D.   On: 07/04/2013 09:01   Mm [place] R  07/04/2013   CLINICAL DATA:  Abnormal screening right mammogram.  EXAM: DIGITAL DIAGNOSTIC  right MAMMOGRAM  ULTRASOUND right BREAST  COMPARISON:  With prior exams.  ACR Breast Density Category b: There are scattered areas of fibroglandular density.  FINDINGS: Spot compression views of the lateral aspect of the right breast were performed. There is persistence of a 4 mm low-density nodule in the posterior 3rd of the breast. There are no malignant type microcalcifications.  On physical exam I do not palpate a mass in the right breast.  Ultrasound is performed, showing there is a near anechoic lesion in the right breast at 7 o'clock 8 cm from the nipple measuring 3 x 2 x 3 mm.  IMPRESSION: Probable benign lesion in the right breast.  RECOMMENDATION: Short-term interval followup right mammogram and ultrasound in 6 months is recommended to document stability.  I have discussed the findings and recommendations with the  patient. Results were also provided in writing at the conclusion of the visit. If applicable, a reminder letter will be sent to the patient regarding the next appointment.  BI-RADS CATEGORY  3: Probably benign finding(s) - short interval follow-up suggested.   Electronically Signed   By: Lillia Mountain M.D.   On: 07/04/2013 09:01   IMPRESSION:  1. Atrial fibrillation with rapid ventricular response (HCC)   2. Paroxysmal atrial fibrillation ([place])   3. Essential hypertension   4. Anticoagulation adequate   5. Grade II diastolic dysfunction   6. Idiopathic peripheral neuropathy      ASSESSMENT AND PLAN: Ms. Giese is an 83 year old female who has a history of hypertension, paroxysmal atrial fibrillation,  hyperlipidemia and peripheral vascular disease. She  is on eliquis for anticoagulation and tolerating this well.  She developed recurrent AF and underwent successful cardioversion during her emergency room evaluation on 01/15/2017. Marland Kitchen  She has not had any recurrent A. fib or awareness of palpitations since.  She continues to be on sotalol 80 mg twice a day with a normal QTc interval of 452 ms.  Additionally, she is on metoprolol, tartrate 50 mg in the morning and 25 mg at night in addition to chlorthalidone 25 mg.  Her blood pressure today is controlled on above therapy.  She is not orthostatic.  Her echo doppler study shows normal systolic function with LVH and grade 2 diastolic dysfunction, which may be contributing to some of her exertional dyspnea.  She's not having any chest pain or symptoms of angina.  She has a peripheral neuropathy which is improved with gabapentin.  She continues to take eliquis for anticoagulation with her PAF.  He denies any bleeding.  She has GERD which is controlled with ranitidine.  She will be seeing her primary physician next week.  As long as she remains stable, I will see her in 6 months for cardiology reevaluation.  Time spent: 25 minutes Troy Sine, MD, [facility]    06/23/2017 5:44 PM

## 2017-06-29 ENCOUNTER — Ambulatory Visit (INDEPENDENT_AMBULATORY_CARE_PROVIDER_SITE_OTHER): Payer: Medicare HMO | Admitting: Internal Medicine

## 2017-06-29 ENCOUNTER — Encounter: Payer: Self-pay | Admitting: Internal Medicine

## 2017-06-29 VITALS — BP 128/78 | HR 60 | Temp 97.8°F | Resp 16 | Ht 66.0 in | Wt 200.8 lb

## 2017-06-29 DIAGNOSIS — I480 Paroxysmal atrial fibrillation: Secondary | ICD-10-CM

## 2017-06-29 DIAGNOSIS — E669 Obesity, unspecified: Secondary | ICD-10-CM

## 2017-06-29 DIAGNOSIS — K219 Gastro-esophageal reflux disease without esophagitis: Secondary | ICD-10-CM

## 2017-06-29 DIAGNOSIS — E66811 Obesity, class 1: Secondary | ICD-10-CM

## 2017-06-29 DIAGNOSIS — I10 Essential (primary) hypertension: Secondary | ICD-10-CM | POA: Diagnosis not present

## 2017-06-29 NOTE — Patient Instructions (Signed)

## 2017-06-29 NOTE — Progress Notes (Signed)
Subjective:  Patient ID: [name], female    DOB: 27-Sep-1932  Age: 83 y.o. MRN: [id number]  CC: Atrial Fibrillation and Hypertension   HPI [name] presents for a BP check - she feels well, offers no complaints.  Outpatient Medications Prior to Visit  Medication Sig Dispense Refill  . apixaban (ELIQUIS) 5 MG TABS tablet Take 1 tablet (5 mg total) by mouth 2 (two) times daily. 180 tablet 3  . Calcium Carbonate-Vitamin D (CALCIUM 600 + D PO) Take 2 tablets by mouth daily.    . chlorthalidone (HYGROTON) 25 MG tablet Take 1 tablet (25 mg total) by mouth daily. 90 tablet 1  . cyanocobalamin 2000 MCG tablet Take 1 tablet (2,000 mcg total) by mouth daily. 90 tablet 3  . Dietary Management Product (FOSTEUM PLUS) CAPS Take 1 capsule by mouth 2 (two) times daily. 60 capsule 11  . gabapentin (NEURONTIN) 100 MG capsule Take 100 mg by mouth 3 (three) times daily.    . metoprolol tartrate (LOPRESSOR) 50 MG tablet Take 50 mg by mouth. Patient states she takes 1 tab in the morning and half tab in the evening Daily    . potassium chloride (K-DUR) 10 MEQ tablet Take 5 mEq by mouth daily.    . ranitidine (ZANTAC) 300 MG tablet Take 1 tablet (300 mg total) by mouth every morning. 90 tablet 3  . sotalol (BETAPACE) 80 MG tablet Take 1 tablet (80 mg total) by mouth every 12 (twelve) hours. 180 tablet 3   No facility-administered medications prior to visit.     ROS Review of Systems  Constitutional: Negative.  Negative for appetite change, diaphoresis, fatigue and unexpected weight change.  HENT: Negative.   Eyes: Negative.   Respiratory: Negative.  Negative for cough, chest tightness, shortness of breath and wheezing.   Cardiovascular: Negative for chest pain, palpitations and leg swelling.  Gastrointestinal: Negative for abdominal pain, constipation, diarrhea, nausea and vomiting.  Endocrine: Negative.   Genitourinary: Negative.  Negative for difficulty urinating.  Musculoskeletal:  Negative.  Negative for arthralgias and myalgias.  Skin: Negative for color change.  Allergic/Immunologic: Negative.   Neurological: Negative.  Negative for dizziness, weakness, light-headedness and numbness.  Hematological: Negative for adenopathy. Does not bruise/bleed easily.  Psychiatric/Behavioral: Negative.     Objective:  BP 128/78 (BP Location: Left Arm, Patient Position: Sitting, Cuff Size: Large)   Pulse 60   Temp 97.8 F (36.6 C) (Oral)   Resp 16   Ht 5\' 6"  (1.676 m)   Wt 200 lb 12 oz (91.1 kg)   SpO2 97%   BMI 32.40 kg/m   BP Readings from Last 3 Encounters:  06/29/17 128/78  06/23/17 124/66  04/16/17 (!) 157/74    Wt Readings from Last 3 Encounters:  06/29/17 200 lb 12 oz (91.1 kg)  06/23/17 201 lb (91.2 kg)  04/16/17 200 lb 12.8 oz (91.1 kg)    Physical Exam  Constitutional: She is oriented to person, place, and time. No distress.  HENT:  Mouth/Throat: Oropharynx is clear and moist. No oropharyngeal exudate.  Eyes: Conjunctivae are normal. Right eye exhibits no discharge. Left eye exhibits no discharge. No scleral icterus.  Neck: Normal range of motion. Neck supple. No JVD present. No thyromegaly present.  Cardiovascular: Normal rate, regular rhythm and intact distal pulses.  Exam reveals no gallop and no friction rub.   No murmur heard. Pulmonary/Chest: Effort normal and breath sounds normal. No respiratory distress. She has no wheezes. She has no rales.  She exhibits no tenderness.  Abdominal: Soft. Bowel sounds are normal. She exhibits no distension and no mass. There is no tenderness. There is no rebound and no guarding.  Musculoskeletal: Normal range of motion. She exhibits no edema or tenderness.  Lymphadenopathy:    She has no cervical adenopathy.  Neurological: She is alert and oriented to person, place, and time.  Skin: Skin is warm and dry. No rash noted. She is not diaphoretic. No erythema. No pallor.  Vitals reviewed.   Lab Results    Component Value Date   WBC 4.6 04/09/2017   HGB 15.0 04/09/2017   HCT 45.1 04/09/2017   PLT 166 04/09/2017   GLUCOSE 93 04/09/2017   CHOL 187 08/28/2016   TRIG 209 (H) 08/28/2016   HDL 59 08/28/2016   LDLDIRECT 152.3 02/10/2012   LDLCALC 86 08/28/2016   ALT 21 04/09/2017   AST 26 04/09/2017   NA 143 04/09/2017   K 3.5 04/09/2017   CL 107 01/15/2017   CREATININE 0.8 04/09/2017   BUN 16.5 04/09/2017   CO2 31 (H) 04/09/2017   TSH 1.62 11/27/2016   INR 1.32 04/18/2015   HGBA1C 5.5 12/18/2016    Dg Chest Port 1 View  Result Date: 01/15/2017 CLINICAL DATA:  Atrial fibrillation, pre cardioversion EXAM: PORTABLE CHEST 1 VIEW COMPARISON:  04/17/2015 FINDINGS: The heart size and mediastinal contours are within normal limits. Both lungs are clear. External defibrillator paddles project over the heart and mediastinum. Osteoarthritis of the right AC joint with undersurface spurring. No acute osseous abnormality. IMPRESSION: No active disease. Electronically Signed   By: Ashley Royalty M.D.   On: 01/15/2017 22:37    Assessment & Plan:   Jaice was seen today for atrial fibrillation and hypertension.  Diagnoses and all orders for this visit:  Essential hypertension- her BP is well controlled  Obesity (BMI 30.0-34.9)- she will work on her lifestyle modifications to lose weight  PAF (paroxysmal atrial fibrillation) ([place])- she has good rate/rhythm control, will cont the NOAC  Gastroesophageal reflux disease without esophagitis- her sx's are well controlled   I am having Ms. Galindez maintain her Calcium Carbonate-Vitamin D (CALCIUM 600 + D PO), ranitidine, sotalol, apixaban, chlorthalidone, cyanocobalamin, FOSTEUM PLUS, metoprolol tartrate, potassium chloride, and gabapentin.  No orders of the defined types were placed in this encounter.    Follow-up: Return in about 6 months (around 12/28/2017).  [name], MD

## 2017-07-01 ENCOUNTER — Ambulatory Visit: Payer: Medicare HMO | Admitting: Neurology

## 2017-07-07 ENCOUNTER — Emergency Department (HOSPITAL_COMMUNITY): Payer: Medicare HMO

## 2017-07-07 ENCOUNTER — Encounter (HOSPITAL_COMMUNITY): Payer: Self-pay

## 2017-07-07 ENCOUNTER — Inpatient Hospital Stay (HOSPITAL_COMMUNITY)
Admission: EM | Admit: 2017-07-07 | Discharge: 2017-07-10 | DRG: 310 | Disposition: A | Discharge status: Home / Self Care | Payer: Medicare HMO | Attending: Cardiology | Admitting: Cardiology

## 2017-07-07 DIAGNOSIS — M810 Age-related osteoporosis without current pathological fracture: Secondary | ICD-10-CM | POA: Diagnosis present

## 2017-07-07 DIAGNOSIS — Z8679 Personal history of other diseases of the circulatory system: Secondary | ICD-10-CM | POA: Diagnosis not present

## 2017-07-07 DIAGNOSIS — Z886 Allergy status to analgesic agent status: Secondary | ICD-10-CM | POA: Diagnosis not present

## 2017-07-07 DIAGNOSIS — Z885 Allergy status to narcotic agent status: Secondary | ICD-10-CM

## 2017-07-07 DIAGNOSIS — K449 Diaphragmatic hernia without obstruction or gangrene: Secondary | ICD-10-CM | POA: Diagnosis present

## 2017-07-07 DIAGNOSIS — G629 Polyneuropathy, unspecified: Secondary | ICD-10-CM | POA: Diagnosis present

## 2017-07-07 DIAGNOSIS — R079 Chest pain, unspecified: Secondary | ICD-10-CM

## 2017-07-07 DIAGNOSIS — I739 Peripheral vascular disease, unspecified: Secondary | ICD-10-CM | POA: Diagnosis not present

## 2017-07-07 DIAGNOSIS — K219 Gastro-esophageal reflux disease without esophagitis: Secondary | ICD-10-CM | POA: Diagnosis not present

## 2017-07-07 DIAGNOSIS — K222 Esophageal obstruction: Secondary | ICD-10-CM | POA: Diagnosis present

## 2017-07-07 DIAGNOSIS — Z888 Allergy status to other drugs, medicaments and biological substances status: Secondary | ICD-10-CM | POA: Diagnosis not present

## 2017-07-07 DIAGNOSIS — Z7901 Long term (current) use of anticoagulants: Secondary | ICD-10-CM

## 2017-07-07 DIAGNOSIS — E876 Hypokalemia: Secondary | ICD-10-CM

## 2017-07-07 DIAGNOSIS — I4581 Long QT syndrome: Secondary | ICD-10-CM | POA: Diagnosis present

## 2017-07-07 DIAGNOSIS — M1990 Unspecified osteoarthritis, unspecified site: Secondary | ICD-10-CM | POA: Diagnosis present

## 2017-07-07 DIAGNOSIS — I4891 Unspecified atrial fibrillation: Secondary | ICD-10-CM | POA: Insufficient documentation

## 2017-07-07 DIAGNOSIS — I083 Combined rheumatic disorders of mitral, aortic and tricuspid valves: Secondary | ICD-10-CM | POA: Diagnosis not present

## 2017-07-07 DIAGNOSIS — Z85048 Personal history of other malignant neoplasm of rectum, rectosigmoid junction, and anus: Secondary | ICD-10-CM

## 2017-07-07 DIAGNOSIS — I2510 Atherosclerotic heart disease of native coronary artery without angina pectoris: Secondary | ICD-10-CM | POA: Diagnosis present

## 2017-07-07 DIAGNOSIS — Z8719 Personal history of other diseases of the digestive system: Secondary | ICD-10-CM

## 2017-07-07 DIAGNOSIS — R000 Tachycardia, unspecified: Secondary | ICD-10-CM | POA: Diagnosis not present

## 2017-07-07 DIAGNOSIS — I480 Paroxysmal atrial fibrillation: Secondary | ICD-10-CM | POA: Diagnosis present

## 2017-07-07 DIAGNOSIS — I10 Essential (primary) hypertension: Secondary | ICD-10-CM | POA: Diagnosis present

## 2017-07-07 DIAGNOSIS — I481 Persistent atrial fibrillation: Principal | ICD-10-CM | POA: Diagnosis present

## 2017-07-07 DIAGNOSIS — K760 Fatty (change of) liver, not elsewhere classified: Secondary | ICD-10-CM | POA: Diagnosis present

## 2017-07-07 DIAGNOSIS — E785 Hyperlipidemia, unspecified: Secondary | ICD-10-CM | POA: Diagnosis present

## 2017-07-07 DIAGNOSIS — R0789 Other chest pain: Secondary | ICD-10-CM | POA: Diagnosis present

## 2017-07-07 DIAGNOSIS — Z79899 Other long term (current) drug therapy: Secondary | ICD-10-CM

## 2017-07-07 LAB — CBC
HCT: 45.8 % (ref 36.0–46.0)
HEMATOCRIT: 42.4 % (ref 36.0–46.0)
HEMOGLOBIN: 14.7 g/dL (ref 12.0–15.0)
Hemoglobin: 15.9 g/dL — ABNORMAL HIGH (ref 12.0–15.0)
MCH: 31.5 pg (ref 26.0–34.0)
MCH: 31.7 pg (ref 26.0–34.0)
MCHC: 34.7 g/dL (ref 30.0–36.0)
MCHC: 34.7 g/dL (ref 30.0–36.0)
MCV: 90.7 fL (ref 78.0–100.0)
MCV: 91.4 fL (ref 78.0–100.0)
PLATELETS: 201 10*3/uL (ref 150–400)
Platelets: 179 10*3/uL (ref 150–400)
RBC: 5.05 MIL/uL (ref 3.87–5.11)
RBC: 4.64 MIL/uL (ref 3.87–5.11)
RDW: 13.5 % (ref 11.5–15.5)
RDW: 13.6 % (ref 11.5–15.5)
WBC: 8.1 10*3/uL (ref 4.0–10.5)
WBC: 6.5 10*3/uL (ref 4.0–10.5)

## 2017-07-07 LAB — BASIC METABOLIC PANEL
ANION GAP: 9 (ref 5–15)
Anion gap: 12 (ref 5–15)
BUN: 23 mg/dL — AB (ref 6–20)
BUN: 21 mg/dL — ABNORMAL HIGH (ref 6–20)
CALCIUM: 9.8 mg/dL (ref 8.9–10.3)
CHLORIDE: 102 mmol/L (ref 101–111)
CHLORIDE: 104 mmol/L (ref 101–111)
CO2: 25 mmol/L (ref 22–32)
CO2: 27 mmol/L (ref 22–32)
CREATININE: 1.1 mg/dL — AB (ref 0.44–1.00)
Calcium: 9.3 mg/dL (ref 8.9–10.3)
Creatinine, Ser: 1.04 mg/dL — ABNORMAL HIGH (ref 0.44–1.00)
GFR calc non Af Amer: 45 mL/min — ABNORMAL LOW (ref >60)
GFR calc non Af Amer: 49 mL/min — ABNORMAL LOW (ref >60)
GFR, EST AFRICAN AMERICAN: 53 mL/min — AB (ref >60)
GFR, EST AFRICAN AMERICAN: 56 mL/min — AB (ref >60)
Glucose, Bld: 115 mg/dL — ABNORMAL HIGH (ref 65–99)
Glucose, Bld: 119 mg/dL — ABNORMAL HIGH (ref 65–99)
POTASSIUM: 3 mmol/L — AB (ref 3.5–5.1)
Potassium: 3 mmol/L — ABNORMAL LOW (ref 3.5–5.1)
SODIUM: 139 mmol/L (ref 135–145)
Sodium: 140 mmol/L (ref 135–145)

## 2017-07-07 LAB — I-STAT TROPONIN, ED: Troponin i, poc: 0.01 ng/mL (ref 0.00–0.08)

## 2017-07-07 LAB — MAGNESIUM: MAGNESIUM: 2 mg/dL (ref 1.7–2.4)

## 2017-07-07 LAB — PROTIME-INR
INR: 1.02
PROTHROMBIN TIME: 13.3 s (ref 11.4–15.2)

## 2017-07-07 MED ORDER — POTASSIUM CHLORIDE CRYS ER 20 MEQ PO TBCR
40.0000 meq | EXTENDED_RELEASE_TABLET | Freq: Once | ORAL | Status: AC
  Administered 2017-07-07: 40 meq via ORAL
  Filled 2017-07-07: qty 2

## 2017-07-07 MED ORDER — METOPROLOL TARTRATE 5 MG/5ML IV SOLN
5.0000 mg | Freq: Once | INTRAVENOUS | Status: AC
  Administered 2017-07-08: 5 mg via INTRAVENOUS
  Filled 2017-07-07: qty 5

## 2017-07-07 MED ORDER — METOPROLOL TARTRATE 5 MG/5ML IV SOLN
5.0000 mg | Freq: Once | INTRAVENOUS | Status: AC
  Administered 2017-07-07: 5 mg via INTRAVENOUS
  Filled 2017-07-07: qty 5

## 2017-07-07 MED ORDER — PROPOFOL 10 MG/ML IV BOLUS
INTRAVENOUS | Status: AC
  Filled 2017-07-07: qty 40

## 2017-07-07 MED ORDER — PROPOFOL 10 MG/ML IV BOLUS
INTRAVENOUS | Status: AC | PRN
  Administered 2017-07-07 (×2): 90 mg via INTRAVENOUS

## 2017-07-07 NOTE — Sedation Documentation (Signed)
Pt awake and talking. MD at bedside. Family to bedside.

## 2017-07-07 NOTE — ED Provider Notes (Signed)
[facility] EMERGENCY DEPARTMENT Provider Note   CSN: [id number] Arrival date & time: 07/07/17  2124     History   Chief Complaint Chief Complaint  Patient presents with  . Atrial Fibrillation    HPI [name] is a 83 y.o. female.  HPI Patient developed feeling of irregular heartbeat and vague anterior chest discomfort at 7 PM tonight.  No shortness of breath.  No other associated symptoms no treatment prior to coming here.  Nothing makes symptoms better or worse. Past Medical History:  Diagnosis Date  . Abnormal nuclear stress test    mild anterolateral septal and inferior ischemia  . Arthritis   . Atherosclerosis of lower extremity with claudication ([place]) 04/17/2015   LEFT LEG  . Atrial fibrillation ([place])   . Claudication ([place])   . Colon cancer ([place])   . Coronary atherosclerosis of unspecified type of vessel, native or graft   . Esophageal stricture   . Fatty liver 11/05/11  . Hiatal hernia   . HLD (hyperlipidemia)   . Hypertension   . Iron deficiency anemia, unspecified   . Ischemic colitis ([place])   . Neuropathy   . Osteoporosis   . PAF (paroxysmal atrial fibrillation) ([place]) 04/2015  . Unspecified vascular insufficiency of intestine     Patient Active Problem List   Diagnosis Date Noted  . Neuromyelopathy due to vitamin B12 deficiency ([place]) 12/01/2016  . Neuropathy involving both lower extremities 11/27/2016  . Atrial fibrillation with rapid ventricular response ([place]) 04/17/2015  . GERD (gastroesophageal reflux disease) 02/25/2015  . Obesity (BMI 30.0-34.9) 07/19/2014  . Stricture and stenosis of esophagus 04/27/2014  . Rectal cancer ([place]) 10/01/2011  . Routine general medical examination at a health care facility 07/21/2011  . HTN (hypertension) 11/28/2010  . CROHN'S DISEASE, LARGE INTESTINE 01/09/2009  . Dyslipidemia, goal LDL below 100 11/08/2007  . MITRAL REGURGITATION, 0 (MILD) 11/08/2007  . Osteoporosis 11/08/2007    Past  Surgical History:  Procedure Laterality Date  . abdominoperineal resection anastomotic stricture  05/2007  . APPENDECTOMY    . DILATION AND CURETTAGE OF UTERUS  1992  . Mandibular Renstruction    . ROTATOR CUFF REPAIR  2006   left  . Rt. Salpingo oophorectomy and cyst removal  2005  . sigmoid resection for invasive rectal adenocarcinoma  12/2006  . TUBAL LIGATION    . WRIST SURGERY  2008   right    OB History    No data available       Home Medications    Prior to Admission medications   Medication Sig Start Date End Date Taking? Authorizing Provider  apixaban (ELIQUIS) 5 MG TABS tablet Take 1 tablet (5 mg total) by mouth 2 (two) times daily. 09/09/16   Troy Sine, MD  Calcium Carbonate-Vitamin D (CALCIUM 600 + D PO) Take 2 tablets by mouth daily.    [provider]  chlorthalidone (HYGROTON) 25 MG tablet Take 1 tablet (25 mg total) by mouth daily. 11/27/16   Janith Lima, MD  cyanocobalamin 2000 MCG tablet Take 1 tablet (2,000 mcg total) by mouth daily. 12/01/16   Janith Lima, MD  Dietary Management Product (FOSTEUM PLUS) CAPS Take 1 capsule by mouth 2 (two) times daily. 12/31/16   Janith Lima, MD  gabapentin (NEURONTIN) 100 MG capsule Take 100 mg by mouth 3 (three) times daily.    [provider]  metoprolol tartrate (LOPRESSOR) 50 MG tablet Take 50 mg by mouth. Patient states she  takes 1 tab in the morning and half tab in the evening Daily    [provider]  potassium chloride (K-DUR) 10 MEQ tablet Take 5 mEq by mouth daily.    [provider]  ranitidine (ZANTAC) 300 MG tablet Take 1 tablet (300 mg total) by mouth every morning. 09/03/16   Troy Sine, MD  sotalol (BETAPACE) 80 MG tablet Take 1 tablet (80 mg total) by mouth every 12 (twelve) hours. 09/03/16   Troy Sine, MD    Family History Family History  Problem Relation Age of Onset  . Colon cancer Father 36  . Heart disease Father   . Hypertension Father   .  Alzheimer's disease Mother   . Thyroid disease Mother   . Hypertension Sister   . Thyroid disease Sister   . Mitral valve prolapse Sister   . Hypertension Son   . Diabetes Son   . Hypertension Son   . Diabetes Son   . Esophageal cancer Neg Hx   . Rectal cancer Neg Hx   . Stomach cancer Neg Hx     Social History Social History   Tobacco Use  . Smoking status: Never Smoker  . Smokeless tobacco: Never Used  Substance Use Topics  . Alcohol use: No  . Drug use: No     Allergies   Crestor [rosuvastatin calcium]; Acetaminophen-codeine; Amlodipine besylate; Diltiazem hcl; Hydrocodone-acetaminophen; Irbesartan-hydrochlorothiazide; Lisinopril; Omeprazole; Propoxyphene n-acetaminophen; Valsartan; Warfarin sodium; and Verapamil   Review of Systems Review of Systems  Constitutional: Negative.   HENT: Negative.   Respiratory: Negative.   Cardiovascular: Positive for chest pain and palpitations.  Gastrointestinal: Negative.   Musculoskeletal: Negative.   Skin: Negative.   Neurological: Negative.   Psychiatric/Behavioral: Negative.   All other systems reviewed and are negative.    Physical Exam Updated Vital Signs BP 124/79 (BP Location: Left Arm)   Pulse (!) 136   Temp 97.9 F (36.6 C) (Oral)   Resp 17   Ht 5\' 6"  (1.676 m)   Wt 89.8 kg (198 lb)   SpO2 98%   BMI 31.96 kg/m   Physical Exam  Constitutional: She appears well-developed and well-nourished. She appears distressed.  HENT:  Head: Normocephalic and atraumatic.  Eyes: Conjunctivae are normal. Pupils are equal, round, and reactive to light.  Neck: Neck supple. No tracheal deviation present. No thyromegaly present.  Cardiovascular:  No murmur heard. Tachycardic irregularly irregular  Pulmonary/Chest: Effort normal and breath sounds normal.  Abdominal: Soft. Bowel sounds are normal. She exhibits no distension. There is no tenderness.  Musculoskeletal: Normal range of motion. She exhibits no edema or  tenderness.  Neurological: She is alert. Coordination normal.  Skin: Skin is warm and dry. No rash noted.  Psychiatric: She has a normal mood and affect.  Nursing note and vitals reviewed.    ED Treatments / Results  Labs (all labs ordered are listed, but only abnormal results are displayed) Labs Reviewed  BASIC METABOLIC PANEL  CBC  PROTIME-INR   Chest x-ray viewed by me EKG  EKG Interpretation  Date/Time:  Tuesday July 07 2017 22:40:04 EST Ventricular Rate:  128 PR Interval:    QRS Duration: 97 QT Interval:  372 QTC Calculation: 522 R Axis:   7 Text Interpretation:  Atrial fibrillation Low voltage, precordial leads Abnormal R-wave progression, early transition ST depression, probably rate related Prolonged QT interval No significant change since last tracing Confirmed by Orlie Dakin [phone]) on 07/07/2017 11:11:28 PM      Results  for orders placed or performed during the hospital encounter of 16/10/96  Basic metabolic panel  Result Value Ref Range   Sodium 139 135 - 145 mmol/L   Potassium 3.0 (L) 3.5 - 5.1 mmol/L   Chloride 102 101 - 111 mmol/L   CO2 25 22 - 32 mmol/L   Glucose, Bld 115 (H) 65 - 99 mg/dL   BUN 23 (H) 6 - 20 mg/dL   Creatinine, Ser 1.10 (H) 0.44 - 1.00 mg/dL   Calcium 9.8 8.9 - 10.3 mg/dL   GFR calc non Af Amer 45 (L) >60 mL/min   GFR calc Af Amer 53 (L) >60 mL/min   Anion gap 12 5 - 15  CBC  Result Value Ref Range   WBC 8.1 4.0 - 10.5 K/uL   RBC 5.05 3.87 - 5.11 MIL/uL   Hemoglobin 15.9 (H) 12.0 - 15.0 g/dL   HCT 45.8 36.0 - 46.0 %   MCV 90.7 78.0 - 100.0 fL   MCH 31.5 26.0 - 34.0 pg   MCHC 34.7 30.0 - 36.0 g/dL   RDW 13.5 11.5 - 15.5 %   Platelets 201 150 - 400 K/uL  Protime-INR- (order if Patient is taking Coumadin / Warfarin)  Result Value Ref Range   Prothrombin Time 13.3 11.4 - 15.2 seconds   INR 1.02   CBC  Result Value Ref Range   WBC 6.5 4.0 - 10.5 K/uL   RBC 4.64 3.87 - 5.11 MIL/uL   Hemoglobin 14.7 12.0 - 15.0 g/dL    HCT 42.4 36.0 - 46.0 %   MCV 91.4 78.0 - 100.0 fL   MCH 31.7 26.0 - 34.0 pg   MCHC 34.7 30.0 - 36.0 g/dL   RDW 13.6 11.5 - 15.5 %   Platelets 179 150 - 400 K/uL  I-stat troponin, ED  Result Value Ref Range   Troponin i, poc 0.01 0.00 - 0.08 ng/mL   Comment 3           Dg Chest Port 1 View  Result Date: 07/07/2017 CLINICAL DATA:  83 y/o F; atrial fibrillation and shortness of breath for 1 day. EXAM: PORTABLE CHEST 1 VIEW COMPARISON:  01/15/2017 chest radiograph FINDINGS: Stable cardiac silhouette within normal limits given projection and technique. Aortic atherosclerosis with calcification. Clear lungs. No pleural effusion or pneumothorax. No acute osseous abnormality is evident. IMPRESSION: No acute pulmonary process identified. Stable cardiac silhouette. Aortic atherosclerosis. Electronically Signed   By: Kristine Garbe M.D.   On: 07/07/2017 22:25   Radiology No results found.  Procedures .Sedation Date/Time: 07/07/2017 10:36 PM Performed by: Orlie Dakin, MD Authorized by: Orlie Dakin, MD   Consent:    Consent obtained:  Verbal and written   Consent given by:  Patient   Risks discussed:  Allergic reaction, dysrhythmia, inadequate sedation, prolonged hypoxia resulting in organ damage, prolonged sedation necessitating reversal and respiratory compromise necessitating ventilatory assistance and intubation   Alternatives discussed:  Analgesia without sedation, anxiolysis and regional anesthesia Universal protocol:    Procedure explained and questions answered to patient or proxy's satisfaction: yes     Relevant documents present and verified: yes     Test results available and properly labeled: yes     Imaging studies available: yes     Required blood products, implants, devices, and special equipment available: yes     Site/side marked: yes     Immediately prior to procedure a time out was called: yes     Patient identity confirmation method:  Verbally  with  patient, arm band and hospital-assigned identification number Indications:    Procedure performed:  Cardioversion   Procedure necessitating sedation performed by:  Physician performing sedation   Intended level of sedation:  Deep Pre-sedation assessment:    ASA classification: class 2 - patient with mild systemic disease     Neck mobility: normal     Mouth opening:  3 or more finger widths   Thyromental distance:  4 finger widths   Mallampati score:  II - soft palate, uvula, fauces visible   Pre-sedation assessments completed and reviewed: airway patency, cardiovascular function, hydration status, mental status, nausea/vomiting, pain level, respiratory function and temperature   Immediate pre-procedure details:    Reassessment: Patient reassessed immediately prior to procedure     Reviewed: vital signs, relevant labs/tests and NPO status     Verified: bag valve mask available, emergency equipment available, intubation equipment available, IV patency confirmed, oxygen available and suction available   Procedure details (see MAR for exact dosages):    Preoxygenation:  Nasal cannula   Sedation:  Propofol   Intra-procedure monitoring:  Blood pressure monitoring, cardiac monitor, continuous pulse oximetry, frequent LOC assessments, frequent vital sign checks and continuous capnometry   Intra-procedure events: none     Total Provider sedation time (minutes):  10 Post-procedure details:    Post-sedation assessment completed:  07/07/2017 10:46 PM   Attendance: Constant attendance by certified staff until patient recovered     Recovery: Patient returned to pre-procedure baseline     Post-sedation assessments completed and reviewed: airway patency, cardiovascular function, hydration status, mental status, nausea/vomiting, pain level, respiratory function and temperature     Patient is stable for discharge or admission: yes     Patient tolerance:  Tolerated well, no immediate  complications .Cardioversion Date/Time: 07/07/2017 10:38 PM Performed by: Orlie Dakin, MD Authorized by: Orlie Dakin, MD   Consent:    Consent obtained:  Verbal and written   Consent given by:  Patient   Risks discussed:  Induced arrhythmia and pain   Alternatives discussed:  Alternative treatment and rate-control medication Pre-procedure details:    Cardioversion basis:  Emergent   Rhythm:  Atrial fibrillation   Electrode placement:  Anterior-posterior Patient sedated: Yes. Refer to sedation procedure documentation for details of sedation.  Attempt one:    Cardioversion mode:  Synchronous   Waveform:  Biphasic   Shock (Joules):  200   Shock outcome:  No change in rhythm Post-procedure details:    Patient status:  Awake   Patient tolerance of procedure:  Tolerated well, no immediate complications Comments:     2 attempts made with 200 J, without change in rhythm     (including critical care time)  Medications Ordered in ED Medications - No data to display   Initial Impression / Assessment and Plan / ED Course  I have reviewed the triage vital signs and the nursing notes.  Pertinent labs & imaging results that were available during my care of the patient were reviewed by me and considered in my medical decision making (see chart for details).     Vagal maneuvers attempted, without success.  Procedural sedation using propofol and cardioversion performed by me .  Cardizem 10 mg IV ordered after failed attempts at cardioversion.  Oral potassium supplementation ordered.  I consult [facility] cardiology service who will arrange for overnight stay   At 11:30 PM patient's heart rate presently 100 bpm.  she complains of mild vague discomfort at subxiphoid area.  She appears  in no distress.Additional iv lopressor ordered  Chads vasc score=3 Final Clinical Impressions(s) / ED Diagnoses  Diagnosis #1 atrial fibrillation with rapid ventricular response #2 hypokalemia #3  chest pain at rest Final diagnoses:  None   CRITICAL CARE Performed by: Orlie Dakin Total critical care time: 45 minutes Critical care time was exclusive of separately billable procedures and treating other patients. Critical care was necessary to treat or prevent imminent or life-threatening deterioration. Critical care was time spent personally by me on the following activities: development of treatment plan with patient and/or surrogate as well as nursing, discussions with consultants, evaluation of patient's response to treatment, examination of patient, obtaining history from patient or surrogate, ordering and performing treatments and interventions, ordering and review of laboratory studies, ordering and review of radiographic studies, pulse oximetry and re-evaluation of patient's condition. ED Discharge Orders    None       Orlie Dakin, MD 07/07/17 2338

## 2017-07-07 NOTE — ED Triage Notes (Signed)
Pt arrived from home states her heart is out of rhythm.  Pt HR 140 in triage. Central chest pain 8/10 and aching.  Pt states she has been cardioverted before and has taken her medications as prescribed.

## 2017-07-07 NOTE — Sedation Documentation (Signed)
Vital signs stable. HR 123

## 2017-07-07 NOTE — Progress Notes (Addendum)
RT NOTE:  RT @ bedside throughout Cardioversion. EtCO2 placed on patient, AMBU bag @ bedside, Airway Cart outside patients room. No RT intervention needed.

## 2017-07-07 NOTE — Sedation Documentation (Signed)
Second 200J to patient. MD at bedside.

## 2017-07-07 NOTE — Sedation Documentation (Signed)
200J to patient. MD at bedside.

## 2017-07-07 NOTE — H&P (Addendum)
Cardiology Admission History and Physical:   Patient ID: [name]; MRN: [id number]; DOB: [birth year]   Admission date: 07/07/2017  Primary Care Provider: Janith Lima, MD Primary Cardiologist: Dr Claiborne [facility]  Primary Electrophysiologist:    Chief Complaint:  Chest pain, plapitations  Patient Profile:   [name] is a 83 y.o. female with a history of GERD, hiatal hernia, PAF  Came to ER with palpitations  And  Slight  Chest  Pressure  Associated  With palpitations, [name] has a history of paroxysmal atrial fibrillation, hypertension, as well as hyperlipidemia. Remotely, she developed myalgias with simvastatin and had not been willing to try additional lipid lowering therapy. In August 2012 an echo Doppler study showed mild asymmetric LVH with proximal septal thickening without LVOT obstruction. She had grade 1 diastolic dysfunction with normal systolic function, mild MR, mild TR, and aortic valve sclerosis with mild MR.   In December 2014 follow-up blood work showed normal renal function with a BUN of 19, creatinine 0.8.  She continued to have significant hyperlipidemia with a total cholesterol of 250, triglycerides 221 HDL 53, VLDL 44 and LDL cholesterol 153.  TSH was normal at 2.275.   [name]  had noticed development of claudication, particularly involving her left leg with activity.  She had episodes of shortness of breath and fatigue.  She  underwent a lower extremity arterial Doppler study which was abnormal and showed an ABI of 0.63 on the left and 1.0 on the right.  The left common iliac, external iliac, common femoral artery and profunda demonstrated multiphasic flow.  However, the superficial femoral artery on the left demonstrated occlusive disease in the proximal to mid thigh with reconstitution of flow in the mid distal segment.  The popliteal artery demonstrated patent monophasic flow with three-vessel runoff.  The anterior tibial artery demonstrated focal stenosis  in the proximal calf with dampened flow distal to this.  She presented to [name] in August 2016 with complaints of palpitations, shortness of breath and presyncope.  She was noted to be in atrial fibrillation with rapid ventricular response.  Her heart rate was initially attempted to be controlled with metoprolol and digitoxin, which was not successful and ultimately sotalol was started.  On 04/20/2015 she underwent successful TEE guided cardioversion.  She was continued on metoprolol as well as eliquis for anticoagulation.  An echo Doppler study on 04/18/2015 showed an EF of 55-60% with mild LVH.  History of Present Illness:    [name] is a 83 y.o. female with a history of GERD, hiatal hernia, PAF  Came to ER with palpitations  And  Slight  Chest  Pressure  Associated  With palpitations, cvnx2  Was  Performed in ER  And  Hypokalemia  Corrected  , with persisiting  afib  , she  Is  Being  Admitted   With  Cardiology on  cardizem gtt.   Past Medical History:  Diagnosis Date  . Abnormal nuclear stress test    mild anterolateral septal and inferior ischemia  . Arthritis   . Atherosclerosis of lower extremity with claudication ([place]) 04/17/2015   LEFT LEG  . Atrial fibrillation ([place])   . Claudication ([place])   . Colon cancer ([place])   . Coronary atherosclerosis of unspecified type of vessel, native or graft   . Esophageal stricture   . Fatty liver 11/05/11  . Hiatal hernia   . HLD (hyperlipidemia)   . Hypertension   . Iron deficiency anemia,  unspecified   . Ischemic colitis ([place])   . Neuropathy   . Osteoporosis   . PAF (paroxysmal atrial fibrillation) ([place]) 04/2015  . Unspecified vascular insufficiency of intestine     Past Surgical History:  Procedure Laterality Date  . abdominoperineal resection anastomotic stricture  05/2007  . APPENDECTOMY    . DILATION AND CURETTAGE OF UTERUS  1992  . Mandibular Renstruction    . ROTATOR CUFF REPAIR  2006   left  . Rt. Salpingo  oophorectomy and cyst removal  2005  . sigmoid resection for invasive rectal adenocarcinoma  12/2006  . TUBAL LIGATION    . WRIST SURGERY  2008   right     Medications Prior to Admission: Prior to Admission medications   Medication Sig Start Date End Date Taking? Authorizing Provider  apixaban (ELIQUIS) 5 MG TABS tablet Take 1 tablet (5 mg total) by mouth 2 (two) times daily. 09/09/16   Holly Sine, MD  Calcium Carbonate-Vitamin D (CALCIUM 600 + D PO) Take 2 tablets by mouth daily.    [provider]  chlorthalidone (HYGROTON) 25 MG tablet Take 1 tablet (25 mg total) by mouth daily. 11/27/16   Holly Lima, MD  cyanocobalamin 2000 MCG tablet Take 1 tablet (2,000 mcg total) by mouth daily. 12/01/16   Holly Lima, MD  Dietary Management Product (FOSTEUM PLUS) CAPS Take 1 capsule by mouth 2 (two) times daily. 12/31/16   Holly Lima, MD  gabapentin (NEURONTIN) 100 MG capsule Take 100 mg by mouth 3 (three) times daily.    [provider]  metoprolol tartrate (LOPRESSOR) 50 MG tablet Take 50 mg by mouth. Patient states she takes 1 tab in the morning and half tab in the evening Daily    [provider]  potassium chloride (K-DUR) 10 MEQ tablet Take 5 mEq by mouth daily.    [provider]  ranitidine (ZANTAC) 300 MG tablet Take 1 tablet (300 mg total) by mouth every morning. 09/03/16   Holly Sine, MD  sotalol (BETAPACE) 80 MG tablet Take 1 tablet (80 mg total) by mouth every 12 (twelve) hours. 09/03/16   Holly Sine, MD     Allergies:    Allergies  Allergen Reactions  . Crestor [Rosuvastatin Calcium] Other (See Comments)    Muscle aches  . Acetaminophen-Codeine   . Amlodipine Besylate     REACTION: swelling  . Diltiazem Hcl     REACTION: rash  . Hydrocodone-Acetaminophen     nausea  . Irbesartan-Hydrochlorothiazide     REACTION: Dizziness  . Lisinopril     cough  . Omeprazole Nausea Only and Other (See Comments)    Dizziness, joint pain,  weakness in legs, cramps in legs, stomach burned  . Propoxyphene N-Acetaminophen     Headache, and blotchy face  . Valsartan     REACTION: blurred vision  . Warfarin Sodium     REACTION: body aches and bleeding  . Verapamil Nausea Only, Palpitations and Other (See Comments)    Headaches     Social History:   Social History   Socioeconomic History  . Marital status: Widowed    Spouse name: Not on file  . Number of children: 4  . Years of education: 12th grade  . Highest education level: Not on file  Social Needs  . Financial resource strain: Not on file  . Food insecurity - worry: Not on file  . Food insecurity - inability: Not on file  .  Transportation needs - medical: Not on file  . Transportation needs - non-medical: Not on file  Occupational History  . Occupation: retired    Fish farm manager: RETIRED  Tobacco Use  . Smoking status: Never Smoker  . Smokeless tobacco: Never Used  Substance and Sexual Activity  . Alcohol use: No  . Drug use: No  . Sexual activity: Not Currently  Other Topics Concern  . Not on file  Social History Narrative   Lives at home with fiance.   Right-handed.   No caffeine use.    Family History:   The patient's family history includes Alzheimer's disease in her mother; Colon cancer (age of onset: 38) in her father; Diabetes in her son and son; Heart disease in her father; Hypertension in her father, sister, son, and son; Mitral valve prolapse in her sister; Thyroid disease in her mother and sister. There is no history of Esophageal cancer, Rectal cancer, or Stomach cancer.    ROS:  Please see the history of present illness.  All other ROS reviewed and negative.   Except  palpitations  Physical Exam/Data:   Vitals:   07/07/17 2204 07/07/17 2232 07/07/17 2237 07/07/17 2243  BP: (!) 123/102 113/88  124/62  Pulse: (!) 145 (!) 127 90 (!) 114  Resp: 20 20 20 16   Temp:      TempSrc:      SpO2:  98%  98%  Weight:      Height:       No intake  or output data in the 24 hours ending 07/07/17 2256 Filed Weights   07/07/17 2133  Weight: 89.8 kg (198 lb)   Body mass index is 31.96 kg/m.  General:  Well nourished, well developed, in no acute distress HEENT: normal Lymph: no adenopathy Neck: no JVD Endocrine:  No thryomegaly Vascular: No carotid bruits; FA pulses 2+ bilaterally without bruits  Cardiac:irregular ; RRR; no murmur  Lungs:  clear to auscultation bilaterally, no wheezing, rhonchi or rales  Abd: soft, nontender, no hepatomegaly  Ext: no edema Musculoskeletal:  No deformities, BUE and BLE strength normal and equal Skin: warm and dry  Neuro:  CNs 2-12 intact, no focal abnormalities noted Psych:  Normal affect    EKG:  The ECG that was done AFIB  RVR Relevant CV Studies: AFIB  RVR  Laboratory Data:  Chemistry Recent Labs  Lab 07/07/17 2133  NA 139  K 3.0*  CL 102  CO2 25  GLUCOSE 115*  BUN 23*  CREATININE 1.10*  CALCIUM 9.8  GFRNONAA 45*  GFRAA 53*  ANIONGAP 12    No results for input(s): PROT, ALBUMIN, AST, ALT, ALKPHOS, BILITOT in the last 168 hours. Hematology Recent Labs  Lab 07/07/17 2133  WBC 8.1  RBC 5.05  HGB 15.9*  HCT 45.8  MCV 90.7  MCH 31.5  MCHC 34.7  RDW 13.5  PLT 201   Cardiac EnzymesNo results for input(s): TROPONINI in the last 168 hours. No results for input(s): TROPIPOC in the last 168 hours.  BNPNo results for input(s): BNP, PROBNP in the last 168 hours.  DDimer No results for input(s): DDIMER in the last 168 hours.  Radiology/Studies:  Dg Chest Port 1 View  Result Date: 07/07/2017 CLINICAL DATA:  83 y/o F; atrial fibrillation and shortness of breath for 1 day. EXAM: PORTABLE CHEST 1 VIEW COMPARISON:  01/15/2017 chest radiograph FINDINGS: Stable cardiac silhouette within normal limits given projection and technique. Aortic atherosclerosis with calcification. Clear lungs. No pleural effusion or pneumothorax.  No acute osseous abnormality is evident. IMPRESSION: No acute  pulmonary process identified. Stable cardiac silhouette. Aortic atherosclerosis. Electronically Signed   By: Kristine Garbe M.D.   On: 07/07/2017 22:25    Assessment and Plan:   1. PAF 2. HTN 3. Dyslipidemia  Rate  Control cardizem GTT On home  Dose  meds Will monitor S/P CVN  Twice  In ER Will watch  BMP,  Hypokalemia- Will replace   CHADS2 VASC  SCORE>3 HTN  AND AGE   Severity of Illness: The appropriate patient status for this patient is OBSERVATION. Observation status is judged to be reasonable and necessary in order to provide the required intensity of service to ensure the patient's safety. The patient's presenting symptoms, physical exam findings, and initial radiographic and laboratory data in the context of their medical condition is felt to place them at decreased risk for further clinical deterioration. Furthermore, it is anticipated that the patient will be medically stable for discharge from the [facility] within 2 midnights of admission. The following factors support the patient status of observation.   " The patient's presenting symptoms include PALPITATIONS. "     For questions or updates, please contact [facility] Please consult [email or web address] for contact info under Cardiology/STEMI.    Signed, Johna Sheriff, MD  07/07/2017 10:56 PM

## 2017-07-08 ENCOUNTER — Other Ambulatory Visit: Payer: Self-pay

## 2017-07-08 DIAGNOSIS — Z7901 Long term (current) use of anticoagulants: Secondary | ICD-10-CM

## 2017-07-08 DIAGNOSIS — I10 Essential (primary) hypertension: Secondary | ICD-10-CM

## 2017-07-08 DIAGNOSIS — E876 Hypokalemia: Secondary | ICD-10-CM

## 2017-07-08 DIAGNOSIS — R079 Chest pain, unspecified: Secondary | ICD-10-CM

## 2017-07-08 DIAGNOSIS — I4891 Unspecified atrial fibrillation: Secondary | ICD-10-CM

## 2017-07-08 LAB — BASIC METABOLIC PANEL
Anion gap: 7 (ref 5–15)
BUN: 20 mg/dL (ref 6–20)
CALCIUM: 9.2 mg/dL (ref 8.9–10.3)
CO2: 27 mmol/L (ref 22–32)
CREATININE: 0.89 mg/dL (ref 0.44–1.00)
Chloride: 107 mmol/L (ref 101–111)
GFR calc Af Amer: >60 mL/min (ref >60)
GFR, EST NON AFRICAN AMERICAN: 59 mL/min — AB (ref >60)
GLUCOSE: 104 mg/dL — AB (ref 65–99)
Potassium: 3.4 mmol/L — ABNORMAL LOW (ref 3.5–5.1)
Sodium: 141 mmol/L (ref 135–145)

## 2017-07-08 LAB — MRSA PCR SCREENING: MRSA by PCR: NEGATIVE

## 2017-07-08 LAB — TROPONIN I

## 2017-07-08 MED ORDER — SOTALOL HCL 80 MG PO TABS
80.0000 mg | ORAL_TABLET | Freq: Two times a day (BID) | ORAL | Status: DC
  Administered 2017-07-08 (×2): 80 mg via ORAL
  Filled 2017-07-08 (×3): qty 1

## 2017-07-08 MED ORDER — APIXABAN 5 MG PO TABS
5.0000 mg | ORAL_TABLET | Freq: Two times a day (BID) | ORAL | Status: DC
  Administered 2017-07-08 – 2017-07-10 (×5): 5 mg via ORAL
  Filled 2017-07-08 (×7): qty 1

## 2017-07-08 MED ORDER — APIXABAN 5 MG PO TABS: 5.0000 mg | ORAL_TABLET | Freq: Two times a day (BID) | ORAL | Status: DC

## 2017-07-08 MED ORDER — ACETAMINOPHEN 325 MG PO TABS: 650.0000 mg | ORAL_TABLET | ORAL | Status: DC | PRN

## 2017-07-08 MED ORDER — POTASSIUM CHLORIDE CRYS ER 20 MEQ PO TBCR
40.0000 meq | EXTENDED_RELEASE_TABLET | Freq: Once | ORAL | Status: AC
  Administered 2017-07-08: 40 meq via ORAL
  Filled 2017-07-08: qty 2

## 2017-07-08 MED ORDER — METOPROLOL TARTRATE 50 MG PO TABS: 50.0000 mg | ORAL_TABLET | Freq: Two times a day (BID) | ORAL | Status: DC

## 2017-07-08 MED ORDER — FAMOTIDINE 20 MG PO TABS
10.0000 mg | ORAL_TABLET | Freq: Every day | ORAL | Status: DC
  Administered 2017-07-08 – 2017-07-10 (×3): 10 mg via ORAL
  Filled 2017-07-08 (×3): qty 1

## 2017-07-08 MED ORDER — POTASSIUM CHLORIDE CRYS ER 10 MEQ PO TBCR: 5.0000 meq | EXTENDED_RELEASE_TABLET | Freq: Every day | ORAL | Status: DC

## 2017-07-08 MED ORDER — GABAPENTIN 100 MG PO CAPS
100.0000 mg | ORAL_CAPSULE | Freq: Three times a day (TID) | ORAL | Status: DC
  Administered 2017-07-08 – 2017-07-10 (×8): 100 mg via ORAL
  Filled 2017-07-08 (×8): qty 1

## 2017-07-08 MED ORDER — METOPROLOL TARTRATE 25 MG PO TABS: 25.0000 mg | ORAL_TABLET | Freq: Every day | ORAL | Status: DC

## 2017-07-08 MED ORDER — ONDANSETRON HCL 4 MG/2ML IJ SOLN: 4.0000 mg | Freq: Four times a day (QID) | INTRAMUSCULAR | Status: DC | PRN

## 2017-07-08 MED ORDER — METOPROLOL TARTRATE 50 MG PO TABS
50.0000 mg | ORAL_TABLET | Freq: Every day | ORAL | Status: DC
  Administered 2017-07-08: 50 mg via ORAL
  Filled 2017-07-08: qty 1

## 2017-07-08 MED ORDER — SOTALOL HCL 120 MG PO TABS
120.0000 mg | ORAL_TABLET | Freq: Two times a day (BID) | ORAL | Status: DC
  Administered 2017-07-08 – 2017-07-10 (×4): 120 mg via ORAL
  Filled 2017-07-08 (×5): qty 1

## 2017-07-08 MED ORDER — METOPROLOL TARTRATE 25 MG PO TABS
25.0000 mg | ORAL_TABLET | Freq: Two times a day (BID) | ORAL | Status: DC
  Administered 2017-07-08 – 2017-07-10 (×4): 25 mg via ORAL
  Filled 2017-07-08 (×4): qty 1

## 2017-07-08 NOTE — Consult Note (Signed)
Cardiology Consultation:   Patient ID: [name]; [id number]; 1933/01/20   Admit date: 07/07/2017 Date of Consult: 07/08/2017  Primary Care Provider: Janith Lima, MD Primary Cardiologist: Dr. Claiborne [facility]   Patient Profile:   [name] is a 83 y.o. female with a hx of PAFib maintained on a/c and sotalol, HTN, HLD, PVD, who is being seen today for the evaluation of AFib at the request of Dr. Marlou [facility].  History of Present Illness:   [name] was last seen buy Dr. Claib[facility] only a few weeks ago, 1023/18, was doing well and was in [place].  She was admitted to [facility] yesterday with c/o CP, palpitations and again found in rapid AFib attempts were made to cardiovert in the ER but failed 2 shocks (both appear to have been at [place]). Cardiology had concerns in titrating her Sotalol with some QT prolongation noted on her EKG and EP is asked to evaluate and help with rate/rhythm management options given extensive med list allergies and intolerances as well.  The patient is feeling better with improved rate control, no ongoing chest pain, no rest SOB, but reports when in AF she gets very winded with minimal exertional, and unusally tired.  With this she mentions concerns of the increased metoprolol dose, already feels like this medicine was making her feel tired and without enrgy even when she is in normal rhythm.  Mentions she tends to be sensitive to medicines.  She states she had been doing well yesterday with eating she had trouble with her hiatal hernia as she does from time to time, felt like her food was stuck, was very uncomfortable, drank " bunch of water" to try and move it felt her heart jump out of rhythm, was racing making her feel weak and chest discomfort.  LABS K+ 3.0 >> 3.4 BUN/Creat 20/0.89 Trop I: <0.03 x3 WBC 6.5 H/H 14/42 Plts 179  Current rate/rhythm meds: Lopressor 50mg  BID  Sotalol 80mg  BID  A/C w/Eliquis, appropriately dosed at 5mg  BID  AF hx: 1st dx Aug 2016 Aug 2016  started on Sotalol 04/20/15 TEE/DCCV successful 01/15/17 back in AF >> successful DCCV 07/07/17 DCCV failed (x2) in ER  Past Medical History:  Diagnosis Date  . Abnormal nuclear stress test    mild anterolateral septal and inferior ischemia  . Arthritis   . Atherosclerosis of lower extremity with claudication ([place]) 04/17/2015   LEFT LEG  . Atrial fibrillation ([place])   . Claudication ([place])   . Colon cancer ([place])   . Coronary atherosclerosis of unspecified type of vessel, native or graft   . Esophageal stricture   . Fatty liver 11/05/11  . Hiatal hernia   . HLD (hyperlipidemia)   . Hypertension   . Iron deficiency anemia, unspecified   . Ischemic colitis ([place])   . Neuropathy   . Osteoporosis   . PAF (paroxysmal atrial fibrillation) ([place]) 04/2015  . Unspecified vascular insufficiency of intestine     Past Surgical History:  Procedure Laterality Date  . abdominoperineal resection anastomotic stricture  05/2007  . APPENDECTOMY    . DILATION AND CURETTAGE OF UTERUS  1992  . Mandibular Renstruction    . ROTATOR CUFF REPAIR  2006   left  . Rt. Salpingo oophorectomy and cyst removal  2005  . sigmoid resection for invasive rectal adenocarcinoma  12/2006  . TUBAL LIGATION    . WRIST SURGERY  2008   right       Inpatient Medications: Scheduled Meds: . apixaban  5 mg Oral BID  . famotidine  10 mg Oral Daily  . gabapentin  100 mg Oral TID  . metoprolol tartrate  50 mg Oral BID  . potassium chloride  40 mEq Oral Once  . sotalol  80 mg Oral Q12H   Continuous Infusions:  PRN Meds: acetaminophen, ondansetron (ZOFRAN) IV  Allergies:    Allergies  Allergen Reactions  . Crestor [Rosuvastatin Calcium] Other (See Comments)    Muscle aches  . Acetaminophen-Codeine   . Amlodipine Besylate     REACTION: swelling  . Diltiazem Hcl     REACTION: rash  . Hydrocodone-Acetaminophen     nausea  . Irbesartan-Hydrochlorothiazide     REACTION: Dizziness  . Lisinopril     cough  .  Omeprazole Nausea Only and Other (See Comments)    Dizziness, joint pain, weakness in legs, cramps in legs, stomach burned  . Propoxyphene N-Acetaminophen     Headache, and blotchy face  . Valsartan     REACTION: blurred vision  . Warfarin Sodium     REACTION: body aches and bleeding  . Verapamil Nausea Only, Palpitations and Other (See Comments)    Headaches     Social History:   Social History   Socioeconomic History  . Marital status: Widowed    Spouse name: Not on file  . Number of children: 4  . Years of education: 12th grade  . Highest education level: Not on file  Social Needs  . Financial resource strain: Not on file  . Food insecurity - worry: Not on file  . Food insecurity - inability: Not on file  . Transportation needs - medical: Not on file  . Transportation needs - non-medical: Not on file  Occupational History  . Occupation: retired    Fish farm manager: RETIRED  Tobacco Use  . Smoking status: Never Smoker  . Smokeless tobacco: Never Used  Substance and Sexual Activity  . Alcohol use: No  . Drug use: No  . Sexual activity: Not Currently  Other Topics Concern  . Not on file  Social History Narrative   Lives at home with fiance.   Right-handed.   No caffeine use.    Family History:   Family History  Problem Relation Age of Onset  . Colon cancer Father 90  . Heart disease Father   . Hypertension Father   . Alzheimer's disease Mother   . Thyroid disease Mother   . Hypertension Sister   . Thyroid disease Sister   . Mitral valve prolapse Sister   . Hypertension Son   . Diabetes Son   . Hypertension Son   . Diabetes Son   . Esophageal cancer Neg Hx   . Rectal cancer Neg Hx   . Stomach cancer Neg Hx      ROS:  Please see the history of present illness.  ROS  All other ROS reviewed and negative.     Physical Exam/Data:   Vitals:   07/08/17 0652 07/08/17 0718 07/08/17 0730 07/08/17 1209  BP: (!) 141/88  (!) 126/94   Pulse: (!) 134 92 (!) 148     Resp: 18 12 (!) 21   Temp:   97.8 F (36.6 C) 97.6 F (36.4 C)  TempSrc:   Oral Oral  SpO2: 96% 97% 97% 96%  Weight:      Height:       No intake or output data in the 24 hours ending 07/08/17 1404 Filed Weights   07/07/17 2133  Weight: 198  lb (89.8 kg)   Body mass index is 31.96 kg/m.  General:  Well nourished, well developed, in no acute distress HEENT: normal Lymph: no adenopathy Neck: no JVD Endocrine:  No thryomegaly Vascular: No carotid bruits  Cardiac:  iRRR; tachycardic, no murmurs, gallops or rubs Lungs:  CTA b/l, no wheezing, rhonchi or rales  Abd: soft, nontender  Ext: no edema Musculoskeletal:  No deformities Skin: warm and dry  Neuro:  No gross focal abnormalities noted Psych:  Normal affect    EKG:  The EKG was personally reviewed and demonstrates:   #1 rapid AFib 143bpm #2 rapid AFib 128bpm, QTc is difficult, appears 520's 06/25/17 SR 62bpm, PR 122ms, QRS 72ms, QTc 450ms Telemetry:  Telemetry was personally reviewed and demonstrates:   AFib, 90's-110 generally, occassionally 120 range  Relevant CV Studies:  02/06/17: TTE Study Conclusions - Left ventricle: The cavity size was normal. Wall thickness was   increased in a pattern of moderate LVH. Systolic function was   normal. The estimated ejection fraction was in the range of 60%   to 65%. Wall motion was normal; there were no regional wall   motion abnormalities. Features are consistent with a pseudonormal   left ventricular filling pattern, with concomitant abnormal   relaxation and increased filling pressure (grade 2 diastolic   dysfunction).  03/09/15: stress test  The left ventricular ejection fraction is hyperdynamic (>65%).  Nuclear stress EF: 71%.  There was no ST segment deviation noted during stress.  No T wave inversion was noted during stress.  Defect 1: There is a small defect of mild severity.  Findings consistent with ischemia.  This is a low risk study.  Low risk stress  nuclear study with a very small area of mild apicoseptal ischemia and normal left ventricular regional and global systolic function.  03/13/15: LHC  Mid Cx lesion, 10% stenosed.  The left ventricular systolic function is normal. Normal LV function with an estimated ejection fraction of 55-60%. No significant coronary obstructive disease with the midportion of the mid LAD dipping intramyocardially without evidence for systolic bridging, smooth 10% narrowing in the AV groove circumflex  coronary artery, and normal dominant RCA. Recommendation: Continued medical therapy.  The patient is scheduled for PV angiography on 04/02/2015 after Doppler study suggested occlusive disease in the left SFA.  Laboratory Data:  Chemistry Recent Labs  Lab 07/07/17 2133 07/07/17 2305 07/08/17 0804  NA 139 140 141  K 3.0* 3.0* 3.4*  CL 102 104 107  CO2 25 27 27   GLUCOSE 115* 119* 104*  BUN 23* 21* 20  CREATININE 1.10* 1.04* 0.89  CALCIUM 9.8 9.3 9.2  GFRNONAA 45* 49* 59*  GFRAA 53* 56* >60  ANIONGAP 12 9 7     No results for input(s): PROT, ALBUMIN, AST, ALT, ALKPHOS, BILITOT in the last 168 hours. Hematology Recent Labs  Lab 07/07/17 2133 07/07/17 2305  WBC 8.1 6.5  RBC 5.05 4.64  HGB 15.9* 14.7  HCT 45.8 42.4  MCV 90.7 91.4  MCH 31.5 31.7  MCHC 34.7 34.7  RDW 13.5 13.6  PLT 201 179   Cardiac Enzymes Recent Labs  Lab 07/08/17 0332 07/08/17 0804 07/08/17 1247  TROPONINI <0.03 <0.03 <0.03    Recent Labs  Lab 07/07/17 2324  TROPIPOC 0.01    BNPNo results for input(s): BNP, PROBNP in the last 168 hours.  DDimer No results for input(s): DDIMER in the last 168 hours.  Radiology/Studies:   Dg Chest Port 1 View Result Date: 07/07/2017 CLINICAL  DATA:  83 y/o F; atrial fibrillation and shortness of breath for 1 day. EXAM: PORTABLE CHEST 1 VIEW COMPARISON:  01/15/2017 chest radiograph FINDINGS: Stable cardiac silhouette within normal limits given projection and technique. Aortic  atherosclerosis with calcification. Clear lungs. No pleural effusion or pneumothorax. No acute osseous abnormality is evident. IMPRESSION: No acute pulmonary process identified. Stable cardiac silhouette. Aortic atherosclerosis. Electronically Signed   By: Kristine Garbe M.D.   On: 07/07/2017 22:25    Assessment and Plan:   1. Persistent AFib     CHA2DS2Vasc is at least 5, on Eliquis     failed DCCV in ER 2 shocks at 200J     Rash with diltiazem, intilerant to verapamil with headaches, nausea     QTc in SR a few weeks ago was OK,      (Calc Creat Cl is 69)  Will review EKGs with Dr. Rayann Heman The patient reports that she had not missed any dose of her Eliquis  until here, she took yesterday AM dose, but was here for her PM dose and reports did not get it last night here  (discussed with Dr. Rayann Heman, will be OK to up-titrate sotalol)  2. Hypokalemia     Replacement with primary team in progress  3. HTN     BP looks OK  4. CP     Likely RVR, no obstructive CAD by cath 2 years ago, neg Trop    For questions or updates, please contact [facility] Please consult [email or web address] for contact info under Cardiology/STEMI.   Signed, Baldwin Jamaica, PA-C  07/08/2017 2:04 PM   I have seen, examined the patient, and reviewed the above assessment and plan.  Changes to above are made where necessary.  On exam, iRRR.  Pt with refractory symptomatic persistent afib. Therapeutic strategies for afib including medicine (sotalol, amiodarone) and ablation were discussed in detail with the patient today. Risk, benefits, and alternatives to each approach were discussed at length with the patient and her family.  She wishes to increase sotalol.  I have reviewed ekgs and QT appears stable.  Will therefore increase sotalol to 120mg  bid at this time.  Anticipate repeat cardioversion in 48 hours.  EP to follow.  Co Sign: Thompson Grayer, MD 07/08/2017 4:42 PM

## 2017-07-08 NOTE — ED Notes (Signed)
Patient denies pain and is resting comfortably.  

## 2017-07-08 NOTE — Progress Notes (Signed)
Progress Note  Patient Name: LATERA MCLIN Date of Encounter: 07/08/2017  Primary Cardiologist: Dr. Corky Downs   Subjective   A little chest pressure is left.  Some SOB with activity. HR 110-119 IV dilt not started due to rash with po dilt.  Inpatient Medications    Scheduled Meds: . apixaban  5 mg Oral BID  . famotidine  10 mg Oral Daily  . gabapentin  100 mg Oral TID  . metoprolol tartrate  25 mg Oral QHS  . metoprolol tartrate  50 mg Oral Daily  . potassium chloride  5 mEq Oral Daily  . sotalol  80 mg Oral Q12H   Continuous Infusions:  PRN Meds: acetaminophen, ondansetron (ZOFRAN) IV   Vital Signs    Vitals:   07/08/17 0645 07/08/17 0652 07/08/17 0718 07/08/17 0730  BP:  (!) 141/88  (!) 126/94  Pulse: (!) 115 (!) 134 92 (!) 148  Resp: 18 18 12  (!) 21  Temp:    97.8 F (36.6 C)  TempSrc:    Oral  SpO2: 98% 96% 97% 97%  Weight:      Height:       No intake or output data in the 24 hours ending 07/08/17 1101 Filed Weights   07/07/17 2133  Weight: 198 lb (89.8 kg)    Telemetry    A fib with RVR improved from admit - Personally Reviewed  ECG    A fib with rate of 128 QTc prolonged at 522 ms.  Mild ST depression due to rate - Personally Reviewed  Physical Exam   GEN: No acute distress.   Neck: No JVD Cardiac: irreg irreg no murmurs, rubs, or gallops.  Respiratory: Clear to auscultation bilaterally. GI: Soft, nontender, non-distended  MS: No to trace edema; No deformity. Neuro:  Nonfocal  Psych: Normal affect   Labs    Chemistry Recent Labs  Lab 07/07/17 2133 07/07/17 2305 07/08/17 0804  NA 139 140 141  K 3.0* 3.0* 3.4*  CL 102 104 107  CO2 25 27 27   GLUCOSE 115* 119* 104*  BUN 23* 21* 20  CREATININE 1.10* 1.04* 0.89  CALCIUM 9.8 9.3 9.2  GFRNONAA 45* 49* 59*  GFRAA 53* 56* >60  ANIONGAP 12 9 7      Hematology Recent Labs  Lab 07/07/17 2133 07/07/17 2305  WBC 8.1 6.5  RBC 5.05 4.64  HGB 15.9* 14.7  HCT 45.8 42.4  MCV 90.7  91.4  MCH 31.5 31.7  MCHC 34.7 34.7  RDW 13.5 13.6  PLT 201 179    Cardiac Enzymes Recent Labs  Lab 07/08/17 0332 07/08/17 0804  TROPONINI <0.03 <0.03    Recent Labs  Lab 07/07/17 2324  TROPIPOC 0.01     BNPNo results for input(s): BNP, PROBNP in the last 168 hours.   DDimer No results for input(s): DDIMER in the last 168 hours.   Radiology    Dg Chest Port 1 View  Result Date: 07/07/2017 CLINICAL DATA:  83 y/o F; atrial fibrillation and shortness of breath for 1 day. EXAM: PORTABLE CHEST 1 VIEW COMPARISON:  01/15/2017 chest radiograph FINDINGS: Stable cardiac silhouette within normal limits given projection and technique. Aortic atherosclerosis with calcification. Clear lungs. No pleural effusion or pneumothorax. No acute osseous abnormality is evident. IMPRESSION: No acute pulmonary process identified. Stable cardiac silhouette. Aortic atherosclerosis. Electronically Signed   By: Kristine Garbe M.D.   On: 07/07/2017 22:25    Cardiac Studies   02/06/17 Echo  Study Conclusions  -  Left ventricle: The cavity size was normal. Wall thickness was   increased in a pattern of moderate LVH. Systolic function was   normal. The estimated ejection fraction was in the range of 60%   to 65%. Wall motion was normal; there were no regional wall   motion abnormalities. Features are consistent with a pseudonormal   left ventricular filling pattern, with concomitant abnormal   relaxation and increased filling pressure (grade 2 diastolic   dysfunction).  Patient Profile     83 y.o. female with hx PAF previously DCCV in 12/2016 and prior to that 2016,  and had maintained SR with BB metoprolol and sotalol, anticoagulation with Eliquis, HTN,HLD, GERD and Hiatal hernia now admitted with a fib RVR at 140 - she  Also had chest pain.  Failed 2 DCCV in ER    Assessment & Plan    A fib with RVR at 140 on admit failed DCCV X 2 at 200J without change in rhythm.  Not placed on Dilt drip   Due to allergy with po dilt.,  HR is down to 114 mostly.  - ?increase sotalol and retry DCCV?  Vs tikosyn, other option possible ablation but pt is somewhat anxious about this.  Dr. Marlou Porch to see.  Anticoagulation Chads vasc score=3 on Eliquis 5 mg BID had not missed any doses  Chest pain with neg troponins most likely due to HR  (only 10% LCX CAD in 03/2015 with cath)  Hypokalemia will replace today at 3.4 up from 3.0 rec'd 40 po last pm will repeat 30 po now. (runs low on chlorthalidone and normally takes 5 meg daily of k dur)     HTN  controlled    For questions or updates, please contact [place] Please consult [email or web address] for contact info under Cardiology/STEMI.      Signed, Cecilie Kicks, NP  07/08/2017, 11:01 AM    Personally seen and examined. Agree with above.  83 year old with failed cardioversion in ER on sotalol 80 BID, prior diltiazem rash, here with AFIB RVR.   Feels better, no SOB, no CP Irreg irreg mildly tachy, CTAB, alert.   Parox AFIB  - hesitant to increase sotalol given age and Qtc previously of 416ms  - metoprolol at home 50 and 25pm  - Eliquis.   - will increase metoprolol to 50 BID  - will consult EP for further suggestions.   [name], MD

## 2017-07-08 NOTE — ED Notes (Signed)
Attempted to call report

## 2017-07-08 NOTE — ED Notes (Signed)
Report called to [facility]. Report given to Delilah Shan, RN. Will hold pt here until 0730, after shift change. Pt up to restroom. Denies pain at this time. Spoke with daughter, gave update. Daughter will be here later to visit. Daughters name is Mechele Claude # [phone].

## 2017-07-08 NOTE — ED Notes (Signed)
Admitting md at bedside speaking with pt

## 2017-07-09 LAB — BASIC METABOLIC PANEL
Anion gap: 7 (ref 5–15)
BUN: 15 mg/dL (ref 6–20)
CHLORIDE: 108 mmol/L (ref 101–111)
CO2: 27 mmol/L (ref 22–32)
CREATININE: 0.89 mg/dL (ref 0.44–1.00)
Calcium: 9.1 mg/dL (ref 8.9–10.3)
GFR calc Af Amer: >60 mL/min (ref >60)
GFR, EST NON AFRICAN AMERICAN: 59 mL/min — AB (ref >60)
Glucose, Bld: 110 mg/dL — ABNORMAL HIGH (ref 65–99)
Potassium: 3.6 mmol/L (ref 3.5–5.1)
SODIUM: 142 mmol/L (ref 135–145)

## 2017-07-09 LAB — MAGNESIUM: MAGNESIUM: 1.7 mg/dL (ref 1.7–2.4)

## 2017-07-09 MED ORDER — SODIUM CHLORIDE 0.9% FLUSH
3.0000 mL | Freq: Two times a day (BID) | INTRAVENOUS | Status: DC
  Administered 2017-07-09 – 2017-07-10 (×3): 3 mL via INTRAVENOUS

## 2017-07-09 MED ORDER — HYDROCORTISONE 1 % EX CREA
1.0000 "application " | TOPICAL_CREAM | Freq: Three times a day (TID) | CUTANEOUS | Status: DC | PRN
  Filled 2017-07-09: qty 28

## 2017-07-09 MED ORDER — SODIUM CHLORIDE 0.9 % IV SOLN
250.0000 mL | INTRAVENOUS | Status: DC
  Administered 2017-07-10: 12:00:00 via INTRAVENOUS

## 2017-07-09 MED ORDER — MAGNESIUM SULFATE 2 GM/50ML IV SOLN: 2.0000 g | Freq: Once | INTRAVENOUS | Status: DC

## 2017-07-09 MED ORDER — SODIUM CHLORIDE 0.9% FLUSH: 3.0000 mL | INTRAVENOUS | Status: DC | PRN

## 2017-07-09 MED ORDER — POTASSIUM CHLORIDE CRYS ER 20 MEQ PO TBCR
30.0000 meq | EXTENDED_RELEASE_TABLET | Freq: Once | ORAL | Status: AC
  Administered 2017-07-09: 10:00:00 30 meq via ORAL
  Filled 2017-07-09: qty 1

## 2017-07-09 MED ORDER — POTASSIUM CHLORIDE CRYS ER 10 MEQ PO TBCR
10.0000 meq | EXTENDED_RELEASE_TABLET | Freq: Every day | ORAL | Status: DC
  Administered 2017-07-09 – 2017-07-10 (×2): 10 meq via ORAL
  Filled 2017-07-09 (×2): qty 1

## 2017-07-09 NOTE — Progress Notes (Deleted)
Progress Note  Patient Name: [name] Date of Encounter: 07/09/2017  Primary Cardiologist: Dr. Corky Downs    Subjective   HR increased during the night to 130-140,  Now a fib RVR at 110.  On Sotalol at 120 mg every 12 hours rec'd one dose and lopressor at 25 mg BID.  No chest pain except with rapid HR last pm.  Also "cold Sweat"   Inpatient Medications    Scheduled Meds: . apixaban  5 mg Oral BID  . famotidine  10 mg Oral Daily  . gabapentin  100 mg Oral TID  . metoprolol tartrate  25 mg Oral BID  . sotalol  120 mg Oral Q12H   Continuous Infusions:  PRN Meds: acetaminophen, ondansetron (ZOFRAN) IV   Vital Signs    Vitals:   07/08/17 2306 07/08/17 2330 07/08/17 2334 07/09/17 0358  BP: (!) 143/93   111/82  Pulse: (!) 126   (!) 120  Resp: 13 (!) 25 19   Temp: 97.8 F (36.6 C)   98.3 F (36.8 C)  TempSrc: Oral   Oral  SpO2: 98%   99%  Weight:    197 lb 1.5 oz (89.4 kg)  Height:        Intake/Output Summary (Last 24 hours) at 07/09/2017 0757 Last data filed at 07/08/2017 1700 Gross per 24 hour  Intake 240 ml  Output -  Net 240 ml   Filed Weights   07/07/17 2133 07/09/17 0358  Weight: 198 lb (89.8 kg) 197 lb 1.5 oz (89.4 kg)    Telemetry    A fib with RVR up during the night now 110-115 - Personally Reviewed  ECG    A fib rate 129  - Personally Reviewed  Physical Exam   GEN: No acute distress.   Neck: No JVD Cardiac: irreg irreg , no murmurs, rubs, or gallops.  Respiratory: Clear to auscultation bilaterally. GI: Soft, nontender, non-distended  MS: No edema; No deformity. Neuro:  Nonfocal  Psych: Normal affect   Labs    Chemistry Recent Labs  Lab 07/07/17 2305 07/08/17 0804 07/09/17 0223  NA 140 141 142  K 3.0* 3.4* 3.6  CL 104 107 108  CO2 27 27 27   GLUCOSE 119* 104* 110*  BUN 21* 20 15  CREATININE 1.04* 0.89 0.89  CALCIUM 9.3 9.2 9.1  GFRNONAA 49* 59* 59*  GFRAA 56* >60 >60  ANIONGAP 9 7 7      Hematology Recent Labs    Lab 07/07/17 2133 07/07/17 2305  WBC 8.1 6.5  RBC 5.05 4.64  HGB 15.9* 14.7  HCT 45.8 42.4  MCV 90.7 91.4  MCH 31.5 31.7  MCHC 34.7 34.7  RDW 13.5 13.6  PLT 201 179    Cardiac Enzymes Recent Labs  Lab 07/08/17 0332 07/08/17 0804 07/08/17 1247  TROPONINI <0.03 <0.03 <0.03    Recent Labs  Lab 07/07/17 2324  TROPIPOC 0.01     BNPNo results for input(s): BNP, PROBNP in the last 168 hours.   DDimer No results for input(s): DDIMER in the last 168 hours.   Radiology    Dg Chest Port 1 View  Result Date: 07/07/2017 CLINICAL DATA:  83 y/o F; atrial fibrillation and shortness of breath for 1 day. EXAM: PORTABLE CHEST 1 VIEW COMPARISON:  01/15/2017 chest radiograph FINDINGS: Stable cardiac silhouette within normal limits given projection and technique. Aortic atherosclerosis with calcification. Clear lungs. No pleural effusion or pneumothorax. No acute osseous abnormality is evident. IMPRESSION: No acute pulmonary  process identified. Stable cardiac silhouette. Aortic atherosclerosis. Electronically Signed   By: Kristine Garbe M.D.   On: 07/07/2017 22:25    Cardiac Studies    02/06/17 Echo  Study Conclusions  - Left ventricle: The cavity size was normal. Wall thickness was increased in a pattern of moderate LVH. Systolic function was normal. The estimated ejection fraction was in the range of 60% to 65%. Wall motion was normal; there were no regional wall motion abnormalities. Features are consistent with a pseudonormal left ventricular filling pattern, with concomitant abnormal relaxation and increased filling pressure (grade 2 diastolic dysfunction).    Patient Profile     83 y.o. female with hx PAF previously DCCV in 12/2016 and prior to that 2016,  and had maintained SR with BB metoprolol and sotalol, anticoagulation with Eliquis, HTN,HLD, GERD and Hiatal hernia now admitted with a fib RVR at 140 - she  Also had chest pain.  Failed 2 DCCV in  ER      Assessment & Plan    A fib with RVR at 140 on admit failed DCCV X 2 at 200J without change in rhythm.  Not placed on Dilt drip  Due to allergy with po dilt.,  HR is down to 114 mostly.  - EP consult was obtained and sotalol increased to 120 mg every 12 hours and lopressor at 25 mg will plan for DCCV tomorrow.  nPO after MN.  Anticoagulation Chads vasc score=5 on Eliquis 5 mg BID had not missed any doses  Chest pain with neg troponins most likely due to HR  (only 10% LCX CAD in 03/2015 with cath)  Hypokalemia 3.6 today normally takes 5 meg daily of k dur  will place on 10 meq daily.     HTN  controlled     For questions or updates, please contact [place] Please consult [email or web address] for contact info under Cardiology/STEMI.      Signed, Cecilie Kicks, NP  07/09/2017, 7:57 AM

## 2017-07-09 NOTE — Progress Notes (Signed)
Progress Note  Patient Name: [name] Date of Encounter: 07/09/2017  Primary Cardiologist: Dr. Claiborne Billings  Subjective   Earlier felt rapid rates/light with transient diaphoretic/cold sweat, chest discomfort, not ongoing or active, no SOB  Inpatient Medications    Scheduled Meds: . apixaban  5 mg Oral BID  . famotidine  10 mg Oral Daily  . gabapentin  100 mg Oral TID  . metoprolol tartrate  25 mg Oral BID  . potassium chloride  10 mEq Oral Daily  . sotalol  120 mg Oral Q12H   Continuous Infusions:  PRN Meds: acetaminophen, ondansetron (ZOFRAN) IV   Vital Signs    Vitals:   07/08/17 2330 07/08/17 2334 07/09/17 0358 07/09/17 0711  BP:   111/82 (P) 115/85  Pulse:   (!) 120   Resp: (!) 25 19    Temp:   98.3 F (36.8 C) (!) (P) 97.4 F (36.3 C)  TempSrc:   Oral (P) Oral  SpO2:   99%   Weight:   197 lb 1.5 oz (89.4 kg)   Height:        Intake/Output Summary (Last 24 hours) at 07/09/2017 0858 Last data filed at 07/08/2017 1700 Gross per 24 hour  Intake 240 ml  Output -  Net 240 ml   Filed Weights   07/07/17 2133 07/09/17 0358  Weight: 198 lb (89.8 kg) 197 lb 1.5 oz (89.4 kg)    Telemetry    AFib 120's - Personally Reviewed  ECG    AFib 129bpm, difficult to assess QT with RVR/AF - Personally Reviewed  Physical Exam   GEN: No acute distress.   Neck: No JVD Cardiac: iRRR, tachycardic, no murmurs, rubs, or gallops.  Respiratory: CTA b/l. GI: Soft, nontender, non-distended  MS: No edema; No deformity. Neuro:  Nonfocal  Psych: Normal affect   Labs    Chemistry Recent Labs  Lab 07/07/17 2305 07/08/17 0804 07/09/17 0223  NA 140 141 142  K 3.0* 3.4* 3.6  CL 104 107 108  CO2 27 27 27   GLUCOSE 119* 104* 110*  BUN 21* 20 15  CREATININE 1.04* 0.89 0.89  CALCIUM 9.3 9.2 9.1  GFRNONAA 49* 59* 59*  GFRAA 56* >60 >60  ANIONGAP 9 7 7      Hematology Recent Labs  Lab 07/07/17 2133 07/07/17 2305  WBC 8.1 6.5  RBC 5.05 4.64  HGB 15.9* 14.7    HCT 45.8 42.4  MCV 90.7 91.4  MCH 31.5 31.7  MCHC 34.7 34.7  RDW 13.5 13.6  PLT 201 179    Cardiac Enzymes Recent Labs  Lab 07/08/17 0332 07/08/17 0804 07/08/17 1247  TROPONINI <0.03 <0.03 <0.03    Recent Labs  Lab 07/07/17 2324  TROPIPOC 0.01     BNPNo results for input(s): BNP, PROBNP in the last 168 hours.   DDimer No results for input(s): DDIMER in the last 168 hours.   Radiology     Cardiac Studies   02/06/17: TTE Study Conclusions - Left ventricle: The cavity size was normal. Wall thickness was increased in a pattern of moderate LVH. Systolic function was normal. The estimated ejection fraction was in the range of 60% to 65%. Wall motion was normal; there were no regional wall motion abnormalities. Features are consistent with a pseudonormal left ventricular filling pattern, with concomitant abnormal relaxation and increased filling pressure (grade 2 diastolic dysfunction).  03/09/15: stress test  The left ventricular ejection fraction is hyperdynamic (>65%).  Nuclear stress EF: 71%.  There was  no ST segment deviation noted during stress.  No T wave inversion was noted during stress.  Defect 1: There is a small defect of mild severity.  Findings consistent with ischemia.  This is a low risk study. Low risk stress nuclear study with a very small area of mild apicoseptal ischemia and normal left ventricular regional and global systolic function.  03/13/15: LHC  Mid Cx lesion, 10% stenosed.  The left ventricular systolic function is normal. Normal LV function with an estimated ejection fraction of 55-60%. No significant coronary obstructive disease with the midportion of the mid LAD dipping intramyocardially without evidence for systolic bridging, smooth 10% narrowing in the AV groove circumflex coronary artery, and normal dominant RCA. Recommendation: Continued medical therapy. 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   Patient Profile     83 y.o. female with a hx of PAFib maintained on a/c and sotalol, HTN, HLD, PVD Admitted with rapid AFib, failed DCCV in ER (x2 attempts) this admission  Assessment & Plan    1. Persistent AFib     CHA2DS2Vasc is at least 5, on Eliquis     failed DCCV in ER 2 shocks at 200J     Rash with diltiazem, intilerant to verapamil with headaches, nausea     QTc in SR a few weeks ago was OK,      (Calc Creat Cl is 69)  The patient reports that she had not missed any dose of her Eliquis  until here, she took home AM dose day of admission, but was here for her PM dose and did not get it that evening, but has gotten all doses since then  (discussed with Dr. Rayann Heman, will be OK to up-titrate sotalol and plan DCCV w/o TEE)  Continue current Mag was 2.0 > 1.7 is OK, K+ is being supplemented, will give additional dose today NPO after MD, DCCV tomorrow  2. Hypokalemia     Replacement with primary team in progress  3. HTN     BP looks OK, no hypotension observed  4. CP     Likely RVR, no obstructive CAD by cath 2 years ago, neg Trop    For questions or updates, please contact [facility] Please consult [email or web address] for contact info under Cardiology/STEMI.      Signed, Baldwin Jamaica, PA-C  07/09/2017, 8:58 AM     I have seen, examined the patient, and reviewed the above assessment and plan.  Changes to above are made where necessary.  After long discussions of options, including sotalol increase, tikosyn, amiodarone, and ablation, the patient is clear that she prefers increasing sotalol has her intervention.  Her first increased dose was last evening.  She remains in afib.  Continue eliquis.  Will require cardioversion tomorrow if she has not converted.  Dr Marlou Porch and I have discussed her at length.  We agree that missing only a single dose of eliquis should not be a contraindication to  cardioversion and that TEE is not required.  Dr Marlou Porch is happy to do the cardioversion tomorrow if we can get that lined up with anesthesia.  Co Sign: Thompson Grayer, MD 07/09/2017 10:35 AM

## 2017-07-09 NOTE — Progress Notes (Addendum)
Patient HR sustaining in 130's as high as 140's. Patient c/o palpitations and chest "fluttering" and some light diaphoresis similar to her symptoms on admission. Cardiology made aware. No further orders at this time. Will continue to monitor.   Update: Pt HR back ranging from 110s-120s. No longer symptomatic. No additional orders at this time. Will continue to monitor.

## 2017-07-10 ENCOUNTER — Inpatient Hospital Stay (HOSPITAL_COMMUNITY): Payer: Medicare HMO | Admitting: Anesthesiology

## 2017-07-10 ENCOUNTER — Encounter (HOSPITAL_COMMUNITY): Payer: Self-pay | Admitting: *Deleted

## 2017-07-10 ENCOUNTER — Encounter (HOSPITAL_COMMUNITY)
Admission: EM | Disposition: A | Discharge status: Home / Self Care | Payer: Self-pay | Source: Home / Self Care | Attending: Cardiology

## 2017-07-10 DIAGNOSIS — R079 Chest pain, unspecified: Secondary | ICD-10-CM

## 2017-07-10 HISTORY — PX: CARDIOVERSION: SHX1299

## 2017-07-10 LAB — BASIC METABOLIC PANEL
Anion gap: 10 (ref 5–15)
BUN: 22 mg/dL — ABNORMAL HIGH (ref 6–20)
CHLORIDE: 106 mmol/L (ref 101–111)
CO2: 25 mmol/L (ref 22–32)
CREATININE: 1.02 mg/dL — AB (ref 0.44–1.00)
Calcium: 9.3 mg/dL (ref 8.9–10.3)
GFR calc non Af Amer: 50 mL/min — ABNORMAL LOW (ref >60)
GFR, EST AFRICAN AMERICAN: 58 mL/min — AB (ref >60)
Glucose, Bld: 101 mg/dL — ABNORMAL HIGH (ref 65–99)
POTASSIUM: 3.7 mmol/L (ref 3.5–5.1)
Sodium: 141 mmol/L (ref 135–145)

## 2017-07-10 LAB — MAGNESIUM: Magnesium: 1.8 mg/dL (ref 1.7–2.4)

## 2017-07-10 SURGERY — CARDIOVERSION
Anesthesia: General

## 2017-07-10 MED ORDER — METOPROLOL TARTRATE 25 MG PO TABS: 25.0000 mg | ORAL_TABLET | Freq: Two times a day (BID) | ORAL | 6 refills | Status: DC

## 2017-07-10 MED ORDER — SOTALOL HCL 120 MG PO TABS: 120.0000 mg | ORAL_TABLET | Freq: Two times a day (BID) | ORAL | 3 refills | Status: DC

## 2017-07-10 MED ORDER — HYDROCORTISONE 1 % EX CREA: 1.0000 "application " | TOPICAL_CREAM | Freq: Three times a day (TID) | CUTANEOUS | 0 refills | Status: DC | PRN

## 2017-07-10 MED ORDER — POTASSIUM CHLORIDE CRYS ER 10 MEQ PO TBCR: 10.0000 meq | EXTENDED_RELEASE_TABLET | Freq: Every day | ORAL | 6 refills | Status: DC

## 2017-07-10 MED ORDER — PROPOFOL 10 MG/ML IV BOLUS
INTRAVENOUS | Status: DC | PRN
  Administered 2017-07-10: 60 mg via INTRAVENOUS

## 2017-07-10 MED ORDER — LIDOCAINE HCL (CARDIAC) 20 MG/ML IV SOLN
INTRAVENOUS | Status: DC | PRN
  Administered 2017-07-10: 40 mg via INTRAVENOUS

## 2017-07-10 MED ORDER — SOTALOL HCL 120 MG PO TABS: 120.0000 mg | ORAL_TABLET | Freq: Two times a day (BID) | ORAL | 11 refills | Status: DC

## 2017-07-10 NOTE — Discharge Summary (Addendum)
Discharge Summary    Patient ID: [name],  MRN: [id number], DOB/AGE: [birth year] 83 y.o.  Admit date: 07/07/2017 Discharge date: 07/10/2017  Primary Care Provider: Janith Lima Primary Cardiologist: Dr. Claiborne Billings  EP Dr. Rayann Heman  Discharge Diagnoses    Principal Problem:   Atrial fibrillation with rapid ventricular response [facility]) Active Problems:   Dyslipidemia, goal LDL below 100   HTN (hypertension)   Anticoagulated   Chest pain at rest   Hypokalemia   Allergies Allergies  Allergen Reactions  . Crestor [Rosuvastatin Calcium] Other (See Comments)    Muscle aches  . Acetaminophen-Codeine   . Amlodipine Besylate     REACTION: swelling  . Diltiazem Hcl     REACTION: rash  . Hydrocodone-Acetaminophen     nausea  . Irbesartan-Hydrochlorothiazide     REACTION: Dizziness  . Lisinopril     cough  . Omeprazole Nausea Only and Other (See Comments)    Dizziness, joint pain, weakness in legs, cramps in legs, stomach burned  . Propoxyphene N-Acetaminophen     Headache, and blotchy face  . Valsartan     REACTION: blurred vision  . Warfarin Sodium     REACTION: body aches and bleeding  . Verapamil Nausea Only, Palpitations and Other (See Comments)    Headaches     Diagnostic Studies/Procedures    07/10/17 DCCV by Dr. Marlou Porch  Procedure: Electrical Cardioversion Indications:  Atrial Fibrillation  Time Out: Verified patient identification, verified procedure,medications/allergies/relevent history reviewed, required imaging and test results available.  Performed  Procedure Details  The patient was NPO after midnight. Anesthesia was administered at the beside  by [facility] with  propofol.  Cardioversion was performed with synchronized biphasic defibrillation via AP pads with 150 joules.  1 attempt(s) were performed.  The patient converted to normal sinus rhythm. The patient tolerated the procedure well   IMPRESSION:  Successful cardioversion of atrial  fibrillation on increased dose of sotalol 120 mg twice a day.  Should be okay for discharge later today.     _____________   History of Present Illness     83 y.o. female with a history of GERD, hiatal hernia, PAF  Came to ER with palpitations  And  Slight  Chest  Pressure  Associated  With palpitations,  [name] has a history of paroxysmal atrial fibrillation, hypertension, as well as hyperlipidemia. Remotely, she developed myalgias with simvastatin and had not been willing to try additional lipid lowering therapy. In August 2012 an echo Doppler study showed mild asymmetric LVH with proximal septal thickening without LVOT obstruction. She had grade 1 diastolic dysfunction with normal systolic function, mild MR, mild TR, and aortic valve sclerosis with mild MR.  Previous P. a fib since 2016 with successful DCCV in 04/2015.  She was on sotalol and metoprolol and eliquis for anticoagulation.  She did well maintaining SR.     07/07/17 she presented due to a fib RVR.and slight chest pressure.  She also had hypokalemia.  She is allergic to dilt so this was not started.  She was admitted to stepdown.  Hospital Course     Consultants: Dr. Rayann Heman   EP consult was obtained with Dr. Rayann Heman and the sotalol was increased to 120 mg BID and metoprolol decreased to 25 BID. She had not missed any Eliquis so DCCV planned.  Her Qtc was prolonged initially but by day of DCCV was stable.   Her DCCV with Dr. Marlou Porch was successful.  She had no further  chest pain.  By the afternoon of 07/10/17 pt was stable and evaluated by Dr. Marlou Porch and found ready for discharge.  She will follow up in a fib clinic next week and with Dr. Claiborne Billings in December.  _____________  Discharge Vitals Blood pressure 105/64, pulse (!) 116, temperature 97.7 F (36.5 C), resp. rate 18, height 5\' 6"  (1.676 m), weight 197 lb (89.4 kg), SpO2 98 %.  Filed Weights   07/07/17 2133 07/09/17 0358 07/10/17 1125  Weight: 198 lb (89.8 kg) 197 lb  1.5 oz (89.4 kg) 197 lb (89.4 kg)    Labs & Radiologic Studies    CBC Recent Labs    07/07/17 2133 07/07/17 2305  WBC 8.1 6.5  HGB 15.9* 14.7  HCT 45.8 42.4  MCV 90.7 91.4  PLT 201 161   Basic Metabolic Panel Recent Labs    07/09/17 0223 07/10/17 0215  NA 142 141  K 3.6 3.7  CL 108 106  CO2 27 25  GLUCOSE 110* 101*  BUN 15 22*  CREATININE 0.89 1.02*  CALCIUM 9.1 9.3  MG 1.7 1.8   Liver Function Tests No results for input(s): AST, ALT, ALKPHOS, BILITOT, PROT, ALBUMIN in the last 72 hours. No results for input(s): LIPASE, AMYLASE in the last 72 hours. Cardiac Enzymes Recent Labs    07/08/17 0332 07/08/17 0804 07/08/17 1247  TROPONINI <0.03 <0.03 <0.03   BNP Invalid input(s): POCBNP D-Dimer No results for input(s): DDIMER in the last 72 hours. Hemoglobin A1C No results for input(s): HGBA1C in the last 72 hours. Fasting Lipid Panel No results for input(s): CHOL, HDL, LDLCALC, TRIG, CHOLHDL, LDLDIRECT in the last 72 hours. Thyroid Function Tests No results for input(s): TSH, T4TOTAL, T3FREE, THYROIDAB in the last 72 hours.  Invalid input(s): FREET3 _____________  Dg Chest Port 1 View  Result Date: 07/07/2017 CLINICAL DATA:  83 y/o F; atrial fibrillation and shortness of breath for 1 day. EXAM: PORTABLE CHEST 1 VIEW COMPARISON:  01/15/2017 chest radiograph FINDINGS: Stable cardiac silhouette within normal limits given projection and technique. Aortic atherosclerosis with calcification. Clear lungs. No pleural effusion or pneumothorax. No acute osseous abnormality is evident. IMPRESSION: No acute pulmonary process identified. Stable cardiac silhouette. Aortic atherosclerosis. Electronically Signed   By: Kristine Garbe M.D.   On: 07/07/2017 22:25   Disposition   Pt is being discharged home today in good condition.  Follow-up Plans & Appointments    Follow-up Information    [name], NP Follow up on 07/17/2017.   Specialties:  Nurse  Practitioner, Cardiology Why:  at 11:00AM in a fib clinic  Contact information: [place] [address] [phone]        Troy Sine, MD Follow up on 08/18/2017.   Specialty:  Cardiology Why:  at 2:00 Pm  Contact information: [address] [place] [address] [phone]          Discharge Instructions    Amb referral to AFIB Clinic   Complete by:  As directed     Do not miss the eliquis.  We increased the sotalol to 120 mg twice a day.  Your lopressor is at 25 mg twice a day.    Heart Healthy diet.    You have an appointment set up with the [facility].  Multiple studies have shown that being followed by a dedicated atrial fibrillation clinic in addition to the standard care you receive from your other physicians improves health. We believe that enrollment  in the atrial fibrillation clinic will allow [place] to better care for you.   The phone number to the [facility] is [phone]. The clinic is staffed Monday through Friday from 8:30am to 5pm.   Discharge Medications   Current Discharge Medication List    START taking these medications   Details  hydrocortisone cream 1 % Apply 1 application 3 (three) times daily as needed topically for itching (skin irritation). Qty: 30 g, Refills: 0    potassium chloride (K-DUR,KLOR-CON) 10 MEQ tablet Take 1 tablet (10 mEq total) daily by mouth. Qty: 30 tablet, Refills: 6      CONTINUE these medications which have CHANGED   Details  metoprolol tartrate (LOPRESSOR) 25 MG tablet Take 1 tablet (25 mg total) 2 (two) times daily by mouth. Qty: 60 tablet, Refills: 6    sotalol (BETAPACE) 120 MG tablet Take 1 tablet (120 mg total) every 12 (twelve) hours by mouth. Qty: 60 tablet, Refills: 11      CONTINUE these medications which have NOT CHANGED   Details  apixaban (ELIQUIS) 5 MG TABS tablet Take 1 tablet (5 mg total) by mouth 2 (two) times daily. Qty: 180  tablet, Refills: 3    Calcium Carbonate-Vitamin D (CALCIUM 600 + D PO) Take 2 tablets by mouth daily.    chlorthalidone (HYGROTON) 25 MG tablet Take 1 tablet (25 mg total) by mouth daily. Qty: 90 tablet, Refills: 1   Associated Diagnoses: Essential hypertension    gabapentin (NEURONTIN) 100 MG capsule Take 100 mg by mouth 3 (three) times daily.    GARLIC PO Take 1 tablet daily by mouth.    ranitidine (ZANTAC) 300 MG tablet Take 1 tablet (300 mg total) by mouth every morning. Qty: 90 tablet, Refills: 3      STOP taking these medications     potassium chloride (K-DUR) 10 MEQ tablet            Outstanding Labs/Studies   EKG   Duration of Discharge Encounter   Greater than 30 minutes including physician time.  Signed, Cecilie Kicks NP 07/10/2017, 2:09 PM  Personally seen and examined. Agree with above.  Feels better post cardioversion (1 shock, 150J). NSR.  Continue sotalol 120 BID Metop now 25 BID Eliquis QTc 480 (QT looks about 412ms personally viewed)  Close follow up.   [name], MD

## 2017-07-10 NOTE — Care Management Note (Signed)
Case Management Note  Patient Details  Name: [name] MRN: [id number] Date of Birth: 1933-05-05  Subjective/Objective:     Pt admitted with A fib with RVR - pt is now s/p cardioversion             Action/Plan:  PTA independent from home on Eliquis.  Pt has PCP and denied barriers to obtaining and paying for medications.  CM will continue to follow for discharge needs    Expected Discharge Date:                  Expected Discharge Plan:  Home/Self Care  In-House Referral:     Discharge planning Services  CM Consult  Post Acute Care Choice:    Choice offered to:     DME Arranged:    DME Agency:     HH Arranged:    HH Agency:     Status of Service:     If discussed at [employer] of Stay Meetings, dates discussed:    Additional Comments:  Maryclare Labrador, RN 07/10/2017, 2:59 PM

## 2017-07-10 NOTE — Transfer of Care (Signed)
Immediate Anesthesia Transfer of Care Note  Patient: [name]  Procedure(s) Performed: CARDIOVERSION (N/A )  Patient Location: Endoscopy Unit  Anesthesia Type:MAC  Level of Consciousness: awake, alert  and oriented  Airway & Oxygen Therapy: Patient Spontanous Breathing and Patient connected to nasal cannula oxygen  Post-op Assessment: Report given to RN and Post -op Vital signs reviewed and stable  Post vital signs: Reviewed and stable  Last Vitals:  Vitals:   07/10/17 0934 07/10/17 1125  BP:  (!) 137/100  Pulse: (!) 116   Resp:  18  Temp:  36.5 C  SpO2:  98%    Last Pain:  Vitals:   07/10/17 1125  TempSrc: Oral  PainSc:          Complications: No apparent anesthesia complications

## 2017-07-10 NOTE — Anesthesia Postprocedure Evaluation (Signed)
Anesthesia Post Note  Patient: [name]  Procedure(s) Performed: CARDIOVERSION (N/A )     Patient location during evaluation: Endoscopy Anesthesia Type: General Level of consciousness: awake and alert Pain management: pain level controlled Vital Signs Assessment: post-procedure vital signs reviewed and stable Respiratory status: spontaneous breathing, nonlabored ventilation, respiratory function stable and patient connected to nasal cannula oxygen Cardiovascular status: blood pressure returned to baseline and stable Postop Assessment: no apparent nausea or vomiting Anesthetic complications: no    Last Vitals:  Vitals:   07/10/17 1125 07/10/17 1238  BP: (!) 137/100 105/64  Pulse:    Resp: 18 18  Temp: 36.5 C 36.5 C  SpO2: 98% 98%    Last Pain:  Vitals:   07/10/17 1125  TempSrc: Oral  PainSc:                  Don Giarrusso,JAMES TERRILL

## 2017-07-10 NOTE — Discharge Instructions (Signed)
Do not miss the eliquis.  We increased the sotalol to 120 mg twice a day.  Your lopressor is at 25 mg twice a day.    Heart Healthy diet.    You have an appointment set up with the [facility].  Multiple studies have shown that being followed by a dedicated atrial fibrillation clinic in addition to the standard care you receive from your other physicians improves health. We believe that enrollment in the atrial fibrillation clinic will allow [place] to better care for you.   The phone number to the [facility] is [phone]. The clinic is staffed Monday through Friday from 8:30am to 5pm.  Parking Directions: The clinic is located in the Heart and Vascular Building connected to [facility]. 1)From [address] turn on to [address] and go to the 3rd entrance  (Heart and Vascular entrance) on the right. 2)Look to the right for Heart &Vascular Parking Garage. 3)A code for the entrance is required please call the clinic to receive this.   4)Take the elevators to the 1st floor. Registration is in the room with the glass walls at the end of the hallway.  If you have any trouble parking or locating the clinic, please dont hesitate to call [phone].

## 2017-07-10 NOTE — CV Procedure (Addendum)
    Electrical Cardioversion Procedure Note [name] [id number] [birth year]  Procedure: Electrical Cardioversion Indications:  Atrial Fibrillation  Time Out: Verified patient identification, verified procedure,medications/allergies/relevent history reviewed, required imaging and test results available.  Performed  Procedure Details  The patient was NPO after midnight. Anesthesia was administered at the beside  by [facility] with  propofol.  Cardioversion was performed with synchronized biphasic defibrillation via AP pads with 150 joules.  1 attempt(s) were performed.  The patient converted to normal sinus rhythm. The patient tolerated the procedure well   IMPRESSION:  Successful cardioversion of atrial fibrillation on increased dose of sotalol 120 mg twice a day.  Should be okay for discharge later today.    [name] 07/10/2017, 12:40 PM

## 2017-07-10 NOTE — Anesthesia Preprocedure Evaluation (Addendum)
Anesthesia Evaluation  Patient identified by MRN, date of birth, ID band Patient awake    Reviewed: Allergy & Precautions, Patient's Chart, lab work & pertinent test results  Airway Mallampati: II  TM Distance: >3 FB Neck ROM: Full    Dental no notable dental hx.    Pulmonary neg pulmonary ROS,    breath sounds clear to auscultation       Cardiovascular hypertension, Pt. on home beta blockers + CAD and + Peripheral Vascular Disease  + dysrhythmias Atrial Fibrillation  Rhythm:Irregular Rate:Normal     Neuro/Psych  Neuromuscular disease negative psych ROS   GI/Hepatic Neg liver ROS, hiatal hernia, GERD  Medicated,  Endo/Other  negative endocrine ROS  Renal/GU negative Renal ROS     Musculoskeletal  (+) Arthritis , Osteoarthritis,    Abdominal   Peds  Hematology   Anesthesia Other Findings - HLD  Reproductive/Obstetrics                           Anesthesia Physical Anesthesia Plan  ASA: III  Anesthesia Plan: General   Post-op Pain Management:    Induction: Intravenous  PONV Risk Score and Plan:   Airway Management Planned: Natural Airway and Nasal Cannula  Additional Equipment:   Intra-op Plan:   Post-operative Plan:   Informed Consent: I have reviewed the patients History and Physical, chart, labs and discussed the procedure including the risks, benefits and alternatives for the proposed anesthesia with the patient or authorized representative who has indicated his/her understanding and acceptance.     Plan Discussed with:   Anesthesia Plan Comments:         Anesthesia Quick Evaluation

## 2017-07-10 NOTE — H&P (View-Only) (Signed)
   Progress Note   Subjective   Doing well today, the patient denies CP or SOB.  No new concerns   Inpatient Medications    Scheduled Meds: . apixaban  5 mg Oral BID  . famotidine  10 mg Oral Daily  . gabapentin  100 mg Oral TID  . metoprolol tartrate  25 mg Oral BID  . potassium chloride  10 mEq Oral Daily  . sodium chloride flush  3 mL Intravenous Q12H  . sotalol  120 mg Oral Q12H   Continuous Infusions: . sodium chloride     PRN Meds: acetaminophen, hydrocortisone cream, ondansetron (ZOFRAN) IV, sodium chloride flush   Vital Signs    Vitals:   07/09/17 2336 07/10/17 0408 07/10/17 0733 07/10/17 0934  BP: 120/69 111/89 122/86   Pulse: (!) 116 (!) 124 85 (!) 116  Resp: 11 15 12    Temp: 98.4 F (36.9 C) 98.4 F (36.9 C) 97.7 F (36.5 C)   TempSrc: Oral Oral Oral   SpO2: 98% 95% 97%   Weight:      Height:        Intake/Output Summary (Last 24 hours) at 07/10/2017 1043 Last data filed at 07/10/2017 0400 Gross per 24 hour  Intake 0 ml  Output -  Net 0 ml   Filed Weights   07/07/17 2133 07/09/17 0358  Weight: 89.8 kg (198 lb) 89.4 kg (197 lb 1.5 oz)    Telemetry    Remains in afib - Personally Reviewed  Physical Exam   GEN- The patient is well appearing, alert and oriented x 3 today.   Head- normocephalic, atraumatic Eyes-  Sclera clear, conjunctiva pink Ears- hearing intact Oropharynx- clear Neck- supple, Lungs- Clear to ausculation bilaterally, normal work of breathing Heart- irregular rate and rhythm  GI- soft, NT, ND, + BS Extremities- no clubbing, cyanosis, or edema  MS- no significant deformity or atrophy Skin- no rash or lesion Psych- euthymic mood, full affect Neuro- strength and sensation are intact   Labs    Chemistry Recent Labs  Lab 07/08/17 0804 07/09/17 0223 07/10/17 0215  NA 141 142 141  K 3.4* 3.6 3.7  CL 107 108 106  CO2 27 27 25   GLUCOSE 104* 110* 101*  BUN 20 15 22*  CREATININE 0.89 0.89 1.02*  CALCIUM 9.2 9.1 9.3    GFRNONAA 59* 59* 50*  GFRAA >60 >60 58*  ANIONGAP 7 7 10      Hematology Recent Labs  Lab 07/07/17 2133 07/07/17 2305  WBC 8.1 6.5  RBC 5.05 4.64  HGB 15.9* 14.7  HCT 45.8 42.4  MCV 90.7 91.4  MCH 31.5 31.7  MCHC 34.7 34.7  RDW 13.5 13.6  PLT 201 179    Cardiac Enzymes Recent Labs  Lab 07/08/17 0332 07/08/17 0804 07/08/17 1247  TROPONINI <0.03 <0.03 <0.03    Recent Labs  Lab 07/07/17 2324  TROPIPOC 0.01        Assessment & Plan    1.  Persistent afib Plans for cardioversion with Dr Marlou Porch this am Qt is stable with increased sotalol On eliquis If converts to sinus, could go home later today  2. HTN Stable No change required today  3. Chest pain Likely due to RVR Not further workup planned while here unless chest pain persists with sinus rhythm   Thompson Grayer MD, [facility] 07/10/2017 10:43 AM

## 2017-07-10 NOTE — Interval H&P Note (Signed)
History and Physical Interval Note:  07/10/2017 12:14 PM  [name]  has presented today for surgery, with the diagnosis of AFIB  The various methods of treatment have been discussed with the patient and family. After consideration of risks, benefits and other options for treatment, the patient has consented to  Procedure(s): CARDIOVERSION (N/A) as a surgical intervention .  The patient's history has been reviewed, patient examined, no change in status, stable for surgery.  I have reviewed the patient's chart and labs.  Questions were answered to the patient's satisfaction.     [employer]

## 2017-07-10 NOTE — Progress Notes (Signed)
   Progress Note   Subjective   Doing well today, the patient denies CP or SOB.  No new concerns   Inpatient Medications    Scheduled Meds: . apixaban  5 mg Oral BID  . famotidine  10 mg Oral Daily  . gabapentin  100 mg Oral TID  . metoprolol tartrate  25 mg Oral BID  . potassium chloride  10 mEq Oral Daily  . sodium chloride flush  3 mL Intravenous Q12H  . sotalol  120 mg Oral Q12H   Continuous Infusions: . sodium chloride     PRN Meds: acetaminophen, hydrocortisone cream, ondansetron (ZOFRAN) IV, sodium chloride flush   Vital Signs    Vitals:   07/09/17 2336 07/10/17 0408 07/10/17 0733 07/10/17 0934  BP: 120/69 111/89 122/86   Pulse: (!) 116 (!) 124 85 (!) 116  Resp: 11 15 12    Temp: 98.4 F (36.9 C) 98.4 F (36.9 C) 97.7 F (36.5 C)   TempSrc: Oral Oral Oral   SpO2: 98% 95% 97%   Weight:      Height:        Intake/Output Summary (Last 24 hours) at 07/10/2017 1043 Last data filed at 07/10/2017 0400 Gross per 24 hour  Intake 0 ml  Output -  Net 0 ml   Filed Weights   07/07/17 2133 07/09/17 0358  Weight: 89.8 kg (198 lb) 89.4 kg (197 lb 1.5 oz)    Telemetry    Remains in afib - Personally Reviewed  Physical Exam   GEN- The patient is well appearing, alert and oriented x 3 today.   Head- normocephalic, atraumatic Eyes-  Sclera clear, conjunctiva pink Ears- hearing intact Oropharynx- clear Neck- supple, Lungs- Clear to ausculation bilaterally, normal work of breathing Heart- irregular rate and rhythm  GI- soft, NT, ND, + BS Extremities- no clubbing, cyanosis, or edema  MS- no significant deformity or atrophy Skin- no rash or lesion Psych- euthymic mood, full affect Neuro- strength and sensation are intact   Labs    Chemistry Recent Labs  Lab 07/08/17 0804 07/09/17 0223 07/10/17 0215  NA 141 142 141  K 3.4* 3.6 3.7  CL 107 108 106  CO2 27 27 25   GLUCOSE 104* 110* 101*  BUN 20 15 22*  CREATININE 0.89 0.89 1.02*  CALCIUM 9.2 9.1 9.3    GFRNONAA 59* 59* 50*  GFRAA >60 >60 58*  ANIONGAP 7 7 10      Hematology Recent Labs  Lab 07/07/17 2133 07/07/17 2305  WBC 8.1 6.5  RBC 5.05 4.64  HGB 15.9* 14.7  HCT 45.8 42.4  MCV 90.7 91.4  MCH 31.5 31.7  MCHC 34.7 34.7  RDW 13.5 13.6  PLT 201 179    Cardiac Enzymes Recent Labs  Lab 07/08/17 0332 07/08/17 0804 07/08/17 1247  TROPONINI <0.03 <0.03 <0.03    Recent Labs  Lab 07/07/17 2324  TROPIPOC 0.01        Assessment & Plan    1.  Persistent afib Plans for cardioversion with Dr Marlou Porch this am Qt is stable with increased sotalol On eliquis If converts to sinus, could go home later today  2. HTN Stable No change required today  3. Chest pain Likely due to RVR Not further workup planned while here unless chest pain persists with sinus rhythm   Thompson Grayer MD, [facility] 07/10/2017 10:43 AM

## 2017-07-10 NOTE — Anesthesia Procedure Notes (Signed)
Procedure Name: MAC Date/Time: 07/10/2017 12:27 PM Performed by: Teressa Lower., CRNA Pre-anesthesia Checklist: Patient identified, Emergency Drugs available, Suction available, Patient being monitored and Timeout performed Patient Re-evaluated:Patient Re-evaluated prior to induction Oxygen Delivery Method: Nasal cannula

## 2017-07-10 NOTE — Progress Notes (Signed)
Discharge note. Patient and daughter educated at bedside. RN educated on medications and when to take them, changes in medication and information on new medications, follow-up appointments, signs and symptoms to look for, when to seek medical attention, and information on disease process and procedures while hospitalized. All questions answered. PIV removed without complications.   Patient discharged by wheelchair with NT.

## 2017-07-11 NOTE — Consult Note (Signed)
            [facility] CM Primary Care Navigator  07/11/2017  [name] [facility] 06/06/1933 [id number]   Attempt to see patient to identify possible discharge needs but patient was already discharged from the stepdown unit 2C 15.  Primary care provider's office is listed to provide transition of care (TOC) and post hospital follow-up.  Patient will follow up in the A-fib clinic next week as scheduled for 11/16 at 11 am and with cardiologist (Dr. Claiborne Billings) in December.   For additional questions please contact:  Edwena Felty A. Farris Blash, BSN, RN-BC [facility] PRIMARY CARE Navigator Cell: [phone]

## 2017-07-13 ENCOUNTER — Encounter (HOSPITAL_COMMUNITY): Payer: Self-pay | Admitting: Cardiology

## 2017-07-17 ENCOUNTER — Encounter (HOSPITAL_COMMUNITY): Payer: Self-pay | Admitting: Nurse Practitioner

## 2017-07-17 ENCOUNTER — Ambulatory Visit (HOSPITAL_COMMUNITY)
Admission: RE | Admit: 2017-07-17 | Discharge: 2017-07-17 | Disposition: A | Discharge status: Home / Self Care | Payer: Medicare HMO | Source: Ambulatory Visit | Attending: Nurse Practitioner | Admitting: Nurse Practitioner

## 2017-07-17 VITALS — BP 138/70 | HR 60 | Ht 66.0 in | Wt 202.0 lb

## 2017-07-17 DIAGNOSIS — Z79899 Other long term (current) drug therapy: Secondary | ICD-10-CM | POA: Insufficient documentation

## 2017-07-17 DIAGNOSIS — I480 Paroxysmal atrial fibrillation: Secondary | ICD-10-CM | POA: Insufficient documentation

## 2017-07-17 DIAGNOSIS — Z7901 Long term (current) use of anticoagulants: Secondary | ICD-10-CM | POA: Diagnosis not present

## 2017-07-17 DIAGNOSIS — Z85038 Personal history of other malignant neoplasm of large intestine: Secondary | ICD-10-CM | POA: Diagnosis not present

## 2017-07-17 DIAGNOSIS — E785 Hyperlipidemia, unspecified: Secondary | ICD-10-CM | POA: Insufficient documentation

## 2017-07-17 DIAGNOSIS — Z9851 Tubal ligation status: Secondary | ICD-10-CM | POA: Insufficient documentation

## 2017-07-17 DIAGNOSIS — I10 Essential (primary) hypertension: Secondary | ICD-10-CM | POA: Insufficient documentation

## 2017-07-17 DIAGNOSIS — Z8249 Family history of ischemic heart disease and other diseases of the circulatory system: Secondary | ICD-10-CM | POA: Diagnosis not present

## 2017-07-17 DIAGNOSIS — Z833 Family history of diabetes mellitus: Secondary | ICD-10-CM | POA: Insufficient documentation

## 2017-07-17 LAB — BASIC METABOLIC PANEL
ANION GAP: 6 (ref 5–15)
BUN: 15 mg/dL (ref 6–20)
CALCIUM: 9.8 mg/dL (ref 8.9–10.3)
CO2: 29 mmol/L (ref 22–32)
Chloride: 103 mmol/L (ref 101–111)
Creatinine, Ser: 0.81 mg/dL (ref 0.44–1.00)
GFR calc non Af Amer: >60 mL/min (ref >60)
Glucose, Bld: 99 mg/dL (ref 65–99)
POTASSIUM: 3.6 mmol/L (ref 3.5–5.1)
Sodium: 138 mmol/L (ref 135–145)

## 2017-07-17 LAB — MAGNESIUM: Magnesium: 1.8 mg/dL (ref 1.7–2.4)

## 2017-07-17 NOTE — Progress Notes (Signed)
Primary Care Physician: Janith Lima, MD Referring Physician: [facility] ER f/u Cardiologist: Dr. Jeralene Peters is a 83 y.o. female with a h/o paroxysmal afib that presented to the ER with afib and slight chest pressure. She was admitted for increase in sotalol to 120 mg bid and then had successful cardioversion 11/9. She is in the afib clinic  for f/u 11/16 and is staying in [place] with stable qtc.  Today, she denies symptoms of palpitations, chest pain, shortness of breath, orthopnea, PND, lower extremity edema, dizziness, presyncope, syncope, or neurologic sequela. The patient is tolerating medications without difficulties and is otherwise without complaint today.   Past Medical History:  Diagnosis Date  . Abnormal nuclear stress test    mild anterolateral septal and inferior ischemia  . Arthritis   . Atherosclerosis of lower extremity with claudication ([place]) 04/17/2015   LEFT LEG  . Atrial fibrillation ([place])   . Claudication ([place])   . Colon cancer ([place])   . Coronary atherosclerosis of unspecified type of vessel, native or graft   . Esophageal stricture   . Fatty liver 11/05/11  . Hiatal hernia   . HLD (hyperlipidemia)   . Hypertension   . Iron deficiency anemia, unspecified   . Ischemic colitis ([place])   . Neuropathy   . Osteoporosis   . PAF (paroxysmal atrial fibrillation) ([place]) 04/2015  . Unspecified vascular insufficiency of intestine    Past Surgical History:  Procedure Laterality Date  . abdominoperineal resection anastomotic stricture  05/2007  . APPENDECTOMY    . CARDIOVERSION N/A 07/10/2017   Performed by Jerline Pain, MD at [facility] ENDOSCOPY  . CARDIOVERSION N/A 04/20/2015   Performed by Jerline Pain, MD at [facility] ENDOSCOPY  . DILATION AND CURETTAGE OF UTERUS  1992  . ESOPHAGOGASTRODUODENOSCOPY (EGD) N/A 04/27/2014   Performed by [place] Dragon, MD at [place]  . Left Heart Cath and Coronary Angiography N/A 03/13/2015   Performed by [place] Sine, MD at [facility]  . Mandibular Renstruction    . ROTATOR CUFF REPAIR  2006   left  . Rt. Salpingo oophorectomy and cyst removal  2005  . SAVORY DILATION N/A 04/27/2014   Performed by Lafayette Dragon, MD at [place]  . sigmoid resection for invasive rectal adenocarcinoma  12/2006  . TRANSESOPHAGEAL ECHOCARDIOGRAM (TEE) N/A 04/20/2015   Performed by Jerline Pain, MD at [place]  . TUBAL LIGATION    . WRIST SURGERY  2008   right    Current Outpatient Medications  Medication Sig Dispense Refill  . apixaban (ELIQUIS) 5 MG TABS tablet Take 1 tablet (5 mg total) by mouth 2 (two) times daily. 180 tablet 3  . Calcium Carbonate-Vitamin D (CALCIUM 600 + D PO) Take 2 tablets by mouth daily.    . chlorthalidone (HYGROTON) 25 MG tablet Take 1 tablet (25 mg total) by mouth daily. 90 tablet 1  . Dietary Management Product (FOSTEUM PLUS) CAPS Take by mouth.    . gabapentin (NEURONTIN) 100 MG capsule Take 100 mg by mouth 3 (three) times daily.    Marland Kitchen GARLIC PO Take 1 tablet daily by mouth.    . hydrocortisone cream 1 % Apply 1 application 3 (three) times daily as needed topically for itching (skin irritation). 30 g 0  . metoprolol tartrate (LOPRESSOR) 25 MG tablet Take 1 tablet (25 mg total) 2 (two) times daily by mouth. 60 tablet 6  . potassium chloride (K-DUR,KLOR-CON)  10 MEQ tablet Take 1 tablet (10 mEq total) daily by mouth. 30 tablet 6  . ranitidine (ZANTAC) 300 MG tablet Take 1 tablet (300 mg total) by mouth every morning. 90 tablet 3  . sotalol (BETAPACE) 120 MG tablet Take 1 tablet (120 mg total) every 12 (twelve) hours by mouth. 180 tablet 3   No current facility-administered medications for this encounter.     Allergies  Allergen Reactions  . Crestor [Rosuvastatin Calcium] Other (See Comments)    Muscle aches  . Acetaminophen-Codeine   . Amlodipine Besylate     REACTION: swelling  . Diltiazem Hcl     REACTION: rash  . Hydrocodone-Acetaminophen     nausea  .  Irbesartan-Hydrochlorothiazide     REACTION: Dizziness  . Lisinopril     cough  . Omeprazole Nausea Only and Other (See Comments)    Dizziness, joint pain, weakness in legs, cramps in legs, stomach burned  . Propoxyphene N-Acetaminophen     Headache, and blotchy face  . Valsartan     REACTION: blurred vision  . Warfarin Sodium     REACTION: body aches and bleeding  . Verapamil Nausea Only, Palpitations and Other (See Comments)    Headaches     Social History   Socioeconomic History  . Marital status: Widowed    Spouse name: Not on file  . Number of children: 4  . Years of education: 12th grade  . Highest education level: Not on file  Social Needs  . Financial resource strain: Not on file  . Food insecurity - worry: Not on file  . Food insecurity - inability: Not on file  . Transportation needs - medical: Not on file  . Transportation needs - non-medical: Not on file  Occupational History  . Occupation: retired    Fish farm manager: RETIRED  Tobacco Use  . Smoking status: Never Smoker  . Smokeless tobacco: Never Used  Substance and Sexual Activity  . Alcohol use: No  . Drug use: No  . Sexual activity: Not Currently  Other Topics Concern  . Not on file  Social History Narrative   Lives at home with fiance.   Right-handed.   No caffeine use.    Family History  Problem Relation Age of Onset  . Colon cancer Father 11  . Heart disease Father   . Hypertension Father   . Alzheimer's disease Mother   . Thyroid disease Mother   . Hypertension Sister   . Thyroid disease Sister   . Mitral valve prolapse Sister   . Hypertension Son   . Diabetes Son   . Hypertension Son   . Diabetes Son   . Esophageal cancer Neg Hx   . Rectal cancer Neg Hx   . Stomach cancer Neg Hx     ROS- All systems are reviewed and negative except as per the HPI above  Physical Exam: Vitals:   07/17/17 1059  BP: 138/70  Pulse: 60  Weight: 202 lb (91.6 kg)  Height: 5\' 6"  (1.676 m)   Wt  Readings from Last 3 Encounters:  07/17/17 202 lb (91.6 kg)  07/10/17 197 lb (89.4 kg)  06/29/17 200 lb 12 oz (91.1 kg)    Labs: Lab Results  Component Value Date   NA 141 07/10/2017   K 3.7 07/10/2017   CL 106 07/10/2017   CO2 25 07/10/2017   GLUCOSE 101 (H) 07/10/2017   BUN 22 (H) 07/10/2017   CREATININE 1.02 (H) 07/10/2017   CALCIUM 9.3 07/10/2017  MG 1.8 07/10/2017   Lab Results  Component Value Date   INR 1.02 07/07/2017   Lab Results  Component Value Date   CHOL 187 08/28/2016   HDL 59 08/28/2016   LDLCALC 86 08/28/2016   TRIG 209 (H) 08/28/2016     GEN- The patient is well appearing, alert and oriented x 3 today.   Head- normocephalic, atraumatic Eyes-  Sclera clear, conjunctiva pink Ears- hearing intact Oropharynx- clear Neck- supple, no JVP Lymph- no cervical lymphadenopathy Lungs- Clear to ausculation bilaterally, normal work of breathing Heart- Regular rate and rhythm, no murmurs, rubs or gallops, PMI not laterally displaced GI- soft, NT, ND, + BS Extremities- no clubbing, cyanosis, or edema MS- no significant deformity or atrophy Skin- no rash or lesion Psych- euthymic mood, full affect Neuro- strength and sensation are intact  EKG-NSR at 60 bpm, pr int 172 ms, qrs int 78 ms, qtc 452 ms Epic records reviewed     Assessment and Plan: 1. Paroxysmal afib   Maintaining SR Continue 120 mg bid of sotalol Continue metoprolol at 25 mg bid Continue Eliquis 5 mg bid for chadsvasc score of at least 5 bmet/mag  F/u with Dr. Claiborne Billings 12/18  Butch Penny C. Kimmie Berggren, [facility] [address] [place], [place] [address] [phone]

## 2017-07-21 ENCOUNTER — Ambulatory Visit (HOSPITAL_COMMUNITY)
Admission: RE | Admit: 2017-07-21 | Discharge: 2017-07-21 | Disposition: A | Discharge status: Home / Self Care | Payer: Medicare HMO | Source: Ambulatory Visit | Attending: Nurse Practitioner | Admitting: Nurse Practitioner

## 2017-07-21 DIAGNOSIS — I481 Persistent atrial fibrillation: Secondary | ICD-10-CM | POA: Insufficient documentation

## 2017-07-21 LAB — BASIC METABOLIC PANEL
Anion gap: 7 (ref 5–15)
BUN: 16 mg/dL (ref 6–20)
CO2: 30 mmol/L (ref 22–32)
CREATININE: 0.89 mg/dL (ref 0.44–1.00)
Calcium: 10.1 mg/dL (ref 8.9–10.3)
Chloride: 103 mmol/L (ref 101–111)
GFR calc Af Amer: >60 mL/min (ref >60)
GFR, EST NON AFRICAN AMERICAN: 59 mL/min — AB (ref >60)
GLUCOSE: 92 mg/dL (ref 65–99)
Potassium: 3.8 mmol/L (ref 3.5–5.1)
SODIUM: 140 mmol/L (ref 135–145)

## 2017-08-18 ENCOUNTER — Encounter: Payer: Self-pay | Admitting: Cardiovascular Disease

## 2017-08-18 ENCOUNTER — Ambulatory Visit: Payer: Medicare HMO | Admitting: Cardiovascular Disease

## 2017-08-18 VITALS — BP 122/64 | HR 60 | Ht 66.0 in | Wt 202.0 lb

## 2017-08-18 DIAGNOSIS — I519 Heart disease, unspecified: Secondary | ICD-10-CM

## 2017-08-18 DIAGNOSIS — K219 Gastro-esophageal reflux disease without esophagitis: Secondary | ICD-10-CM | POA: Diagnosis not present

## 2017-08-18 DIAGNOSIS — Z7901 Long term (current) use of anticoagulants: Secondary | ICD-10-CM

## 2017-08-18 DIAGNOSIS — I10 Essential (primary) hypertension: Secondary | ICD-10-CM | POA: Diagnosis not present

## 2017-08-18 DIAGNOSIS — I480 Paroxysmal atrial fibrillation: Secondary | ICD-10-CM | POA: Diagnosis not present

## 2017-08-18 DIAGNOSIS — R1310 Dysphagia, unspecified: Secondary | ICD-10-CM | POA: Diagnosis not present

## 2017-08-18 DIAGNOSIS — K449 Diaphragmatic hernia without obstruction or gangrene: Secondary | ICD-10-CM

## 2017-08-18 DIAGNOSIS — I5189 Other ill-defined heart diseases: Secondary | ICD-10-CM

## 2017-08-18 NOTE — Progress Notes (Signed)
Patient ID: [name], female   DOB: 05-05-1933, 83 y.o.   MRN: [id number]    PCP: Dr. Eilleen Kempf  HPI: [name] is a 83 y.o. female who presents for a 2 month cardiology evaluation.  [name] has a history of paroxysmal atrial fibrillation, hypertension, as well as hyperlipidemia. Remotely, she developed myalgias with simvastatin and had not been willing to try additional lipid lowering therapy. In August 2012 an echo Doppler study showed mild asymmetric LVH with proximal septal thickening without LVOT obstruction. She had grade 1 diastolic dysfunction with normal systolic function, mild MR, mild TR, and aortic valve sclerosis with mild MR.   In December 2014 follow-up blood work showed normal renal function with a BUN of 19, creatinine 0.8.  She continued to have significant hyperlipidemia with a total cholesterol of 250, triglycerides 221 HDL 53, VLDL 44 and LDL cholesterol 153.  TSH was normal at 2.275.   [name]  had noticed development of claudication, particularly involving her left leg with activity.  She had episodes of shortness of breath and fatigue.  She  underwent a lower extremity arterial Doppler study which was abnormal and showed an ABI of 0.63 on the left and 1.0 on the right.  The left common iliac, external iliac, common femoral artery and profunda demonstrated multiphasic flow.  However, the superficial femoral artery on the left demonstrated occlusive disease in the proximal to mid thigh with reconstitution of flow in the mid distal segment.  The popliteal artery demonstrated patent monophasic flow with three-vessel runoff.  The anterior tibial artery demonstrated focal stenosis in the proximal calf with dampened flow distal to this.  She presented to [facility] hospital in August 2016 with complaints of palpitations, shortness of breath and presyncope.  She was noted to be in atrial fibrillation with rapid ventricular response.  Her heart rate was initially attempted to  be controlled with metoprolol and digitoxin, which was not successful and ultimately sotalol was started.  On 04/20/2015 she underwent successful TEE guided cardioversion.  She was continued on metoprolol as well as eliquis for anticoagulation.  An echo Doppler study on 04/18/2015 showed an EF of 55-60% with mild LVH.  She has multiple allergies and in the past did not tolerate ACE-inhibition or ARB therapy has been able to tolerate direct renin inhibition.  She has a history of GERD and has been taking ranitidine and sucralfate.  She has a history of hyperlipidemia and has had issues with statins.  When I saw her in October 2017 she was maintaining sinus rhythm.  She  presented to the emergency room on 01/15/2017 with chest tightness, shortness of breath and palpitations.  She was found to be back in atrial fibrillation.  She has continued to take anticoagulation with eloquence 5 mg twice a day.  During that evaluation, she underwent successful cardioversion and sinus rhythm was restored with 200 J cardioversion.   When I last saw her in May 2018, she was maintaining sinus rhythm.  I discussed the possibility of sleep apnea in the etiology of her recurrent AF but she did not want to pursue a sleep evaluation.  She underwent a follow-up echo Doppler study on 02/06/2017 which showed an EF of 60-65%.  There was moderate LVH, grade 2 diastolic dysfunction, and she was without major valvular abnormalities.  Since I last saw her, she developed recurrent atrial fibrillation with mild chest pain.  She failed 2 attempts to cardiovert in the emergency room at 200 J.  She ultimately was seen by Dr. Rayann Heman in consultation and although her QTc was mildly prolonged, he felt that it was stable.  As result sotalol was increased to 120 mg twice a day.  2 days later on 07/10/2017.  She underwent successful cardioversion on the increased dose.  She has a maintaining normal rhythm since.  She hadn't atrial fibrillation clinic  follow-up on 07/17/2017 and her ECG showed sinus rhythm at 60 bpm.  She has continued to be on sotalol 120 twice a day, metoprolol 25 mg twice a day, and eliquis 5 mg twice a day for cha2ds2score of at least 5.  Of note, upon further questioning, she has had issues recently where food gets stuck in her esophagus.  Remotely she had undergone esophageal dilatations in 2015.  She presents for evaluation.  Past Medical History:  Diagnosis Date  . Abnormal nuclear stress test    mild anterolateral septal and inferior ischemia  . Arthritis   . Atherosclerosis of lower extremity with claudication ([place]) 04/17/2015   LEFT LEG  . Atrial fibrillation ([place])   . Claudication ([place])   . Colon cancer ([place])   . Coronary atherosclerosis of unspecified type of vessel, native or graft   . Esophageal stricture   . Fatty liver 11/05/11  . Hiatal hernia   . HLD (hyperlipidemia)   . Hypertension   . Iron deficiency anemia, unspecified   . Ischemic colitis ([place])   . Neuropathy   . Osteoporosis   . PAF (paroxysmal atrial fibrillation) ([place]) 04/2015  . Unspecified vascular insufficiency of intestine     Past Surgical History:  Procedure Laterality Date  . abdominoperineal resection anastomotic stricture  05/2007  . APPENDECTOMY    . CARDIAC CATHETERIZATION N/A 03/13/2015   Procedure: Left Heart Cath and Coronary Angiography;  Surgeon: Troy Sine, MD;  Location: [place] [facility];  Service: Cardiovascular;  Laterality: N/A;  . CARDIOVERSION N/A 04/20/2015   Procedure: CARDIOVERSION;  Surgeon: Jerline Pain, MD;  Location: [place];  Service: Cardiovascular;  Laterality: N/A;  . CARDIOVERSION N/A 07/10/2017   Procedure: CARDIOVERSION;  Surgeon: Jerline Pain, MD;  Location: [place];  Service: Cardiovascular;  Laterality: N/A;  . [place]  . ESOPHAGOGASTRODUODENOSCOPY N/A 04/27/2014   Procedure: ESOPHAGOGASTRODUODENOSCOPY (EGD);  Surgeon: Lafayette Dragon, MD;  Location: [facility];  Service: Endoscopy;  Laterality: N/A;  . Mandibular Renstruction    . ROTATOR CUFF REPAIR  2006   left  . Rt. Salpingo oophorectomy and cyst removal  2005  . SAVORY DILATION N/A 04/27/2014   Procedure: SAVORY DILATION;  Surgeon: Lafayette Dragon, MD;  Location: [facility];  Service: Endoscopy;  Laterality: N/A;  no xray needed  . sigmoid resection for invasive rectal adenocarcinoma  12/2006  . TEE WITHOUT CARDIOVERSION N/A 04/20/2015   Procedure: TRANSESOPHAGEAL ECHOCARDIOGRAM (TEE);  Surgeon: Jerline Pain, MD;  Location: [place];  Service: Cardiovascular;  Laterality: N/A;  . TUBAL LIGATION    . WRIST SURGERY  2008   right    Allergies  Allergen Reactions  . Crestor [Rosuvastatin Calcium] Other (See Comments)    Muscle aches  . Acetaminophen-Codeine   . Amlodipine Besylate     REACTION: swelling  . Diltiazem Hcl     REACTION: rash  . Hydrocodone-Acetaminophen     nausea  . Irbesartan-Hydrochlorothiazide     REACTION: Dizziness  . Lisinopril     cough  . Omeprazole Nausea Only and Other (See  Comments)    Dizziness, joint pain, weakness in legs, cramps in legs, stomach burned  . Propoxyphene N-Acetaminophen     Headache, and blotchy face  . Valsartan     REACTION: blurred vision  . Warfarin Sodium     REACTION: body aches and bleeding  . Verapamil Nausea Only, Palpitations and Other (See Comments)    Headaches     Current Outpatient Medications  Medication Sig Dispense Refill  . apixaban (ELIQUIS) 5 MG TABS tablet Take 1 tablet (5 mg total) by mouth 2 (two) times daily. 180 tablet 3  . Calcium Carbonate-Vitamin D (CALCIUM 600 + D PO) Take 2 tablets by mouth daily.    . chlorthalidone (HYGROTON) 25 MG tablet Take 1 tablet (25 mg total) by mouth daily. 90 tablet 1  . Dietary Management Product (FOSTEUM PLUS) CAPS Take by mouth.    . gabapentin (NEURONTIN) 100 MG capsule Take 100 mg by mouth 3 (three) times daily.    Marland Kitchen GARLIC PO Take 1 tablet daily by  mouth.    . hydrocortisone cream 1 % Apply 1 application 3 (three) times daily as needed topically for itching (skin irritation). 30 g 0  . metoprolol tartrate (LOPRESSOR) 25 MG tablet Take 1 tablet (25 mg total) 2 (two) times daily by mouth. 60 tablet 6  . potassium chloride (K-DUR,KLOR-CON) 10 MEQ tablet Take 1 tablet (10 mEq total) daily by mouth. (Patient taking differently: Take 10 mEq by mouth 2 (two) times daily. ) 30 tablet 6  . ranitidine (ZANTAC) 300 MG tablet Take 1 tablet (300 mg total) by mouth every morning. 90 tablet 3  . sotalol (BETAPACE) 120 MG tablet Take 1 tablet (120 mg total) every 12 (twelve) hours by mouth. 180 tablet 3   No current facility-administered medications for this visit.     Social History   Socioeconomic History  . Marital status: Widowed    Spouse name: Not on file  . Number of children: 4  . Years of education: 12th grade  . Highest education level: Not on file  Social Needs  . Financial resource strain: Not on file  . Food insecurity - worry: Not on file  . Food insecurity - inability: Not on file  . Transportation needs - medical: Not on file  . Transportation needs - non-medical: Not on file  Occupational History  . Occupation: retired    Fish farm manager: RETIRED  Tobacco Use  . Smoking status: Never Smoker  . Smokeless tobacco: Never Used  Substance and Sexual Activity  . Alcohol use: No  . Drug use: No  . Sexual activity: Not Currently  Other Topics Concern  . Not on file  Social History Narrative   Lives at home with fiance.   Right-handed.   No caffeine use.    Family History  Problem Relation Age of Onset  . Colon cancer Father 58  . Heart disease Father   . Hypertension Father   . Alzheimer's disease Mother   . Thyroid disease Mother   . Hypertension Sister   . Thyroid disease Sister   . Mitral valve prolapse Sister   . Hypertension Son   . Diabetes Son   . Hypertension Son   . Diabetes Son   . Esophageal cancer Neg Hx     . Rectal cancer Neg Hx   . Stomach cancer Neg Hx    Social history  is notable that she is widowed. She has 3 children and 9 grandchildren. She remains active. There  is no alcohol tobacco use.   ROS General: Negative; No fevers, chills, or night sweats; positive for fatigue and shortness of breath HEENT: Negative; No changes in vision or hearing, sinus congestion, difficulty swallowing Pulmonary: Negative; No cough, wheezing, hemoptysis Cardiovascular: Positive for recurrent AF, status post cardioversion Positive for left leg claudication GI: Positive for GERD; dysphagia GU: Negative; No dysuria, hematuria, or difficulty voiding Musculoskeletal: Negative; no myalgias, joint pain, or weakness Hematologic/Oncology: Negative; no easy bruising, bleeding Endocrine: Negative; no heat/cold intolerance; no diabetes Neuro: Positive for peripheral neuropathy Skin: Negative; No rashes or skin lesions Psychiatric: Negative; No behavioral problems, depression Sleep: Negative; No snoring, daytime sleepiness, hypersomnolence, bruxism, restless legs, hypnogognic hallucinations, no cataplexy Other comprehensive 14 point system review is negative.   PE BP 122/64   Pulse 60   Ht _0  (1.676 m)   Wt 202 lb (91.6 kg)   BMI 32.60 kg/m    Repeat blood pressure by me 120/68  Wt Readings from Last 3 Encounters:  08/18/17 202 lb (91.6 kg)  07/17/17 202 lb (91.6 kg)  07/10/17 197 lb (89.4 kg)   General: Alert, oriented, no distress.  Skin: normal turgor, no rashes, warm and dry HEENT: Normocephalic, atraumatic. Pupils equal round and reactive to light; sclera anicteric; extraocular muscles intact;  Nose without nasal septal hypertrophy Mouth/Parynx benign; Mallinpatti scale 3 Neck: No JVD, no carotid bruits; normal carotid upstroke Lungs: clear to ausculatation and percussion; no wheezing or rales Chest wall: without tenderness to palpitation Heart: PMI not displaced, RRR, s1 s2 normal, 1/6  systolic murmur, no diastolic murmur, no rubs, gallops, thrills, or heaves Abdomen: soft, nontender; no hepatosplenomehaly, BS+; abdominal aorta nontender and not dilated by palpation. Back: no CVA tenderness Pulses 2+ Musculoskeletal: full range of motion, normal strength, no joint deformities Extremities: no clubbing cyanosis or edema, Homan's sign negative  Neurologic: grossly nonfocal; Cranial nerves grossly wnl Psychologic: Normal mood and affect  ECG (independently read by me): Normal sinus rhythm at 60 bpm.  QTc interval 454 ms.  No ST segment changes.  October 2018 ECG (independently read by me): Normal sinus rhythm at 62 bpm.  No significant ST-T changes.  No ectopy.  QTc interval 452 ms.  May 2018 ECG (independently read by me): Sinus bradycardia 59 bpm.  Low voltage.  Normal intervals.  No ST segment changes.  October 2017 ECG (independently read by me): Sinus bradycardia 57 bpm.  No ectopy.  Normal intervals.  November 2016 ECG (independently read by me): Sinus bradycardia 59 bpm.  No ectopy.  Normal intervals.  QTC 457 ms.  05/22/2015 ECG (independently read by me): Normal sinus rhythm at 60 bpm.  QTc interval 454 ms.  No significant ST segment changes.  June 2016 ECG (independently read by me): Normal sinus rhythm at 63 bpm.  Poor precordial R-wave progression.  No ectopy.  November 2015 ECG (independently read by me);  Normal sinus rhythm at 59 bpm.  QTc interval 415 ms.  No significant ST segment changes.  Prior November 2014ECG: Sinus rhythm at 63 beats per minute. Normal intervals.  LABS: BMP Latest Ref Rng & Units 07/21/2017 07/17/2017 07/10/2017  Glucose 65 - 99 mg/dL 92 99 101(H)  BUN 6 - 20 mg/dL 16 15 22(H)  Creatinine 0.44 - 1.00 mg/dL 0.89 0.81 1.02(H)  Sodium 135 - 145 mmol/L 140 138 141  Potassium 3.5 - 5.1 mmol/L 3.8 3.6 3.7  Chloride 101 - 111 mmol/L 103 103 106  CO2 22 - 32 mmol/L 30 29  25  Calcium 8.9 - 10.3 mg/dL 10.1 9.8 9.3   Hepatic Function  Latest Ref Rng & Units 04/09/2017 12/18/2016 11/27/2016  Total Protein 6.4 - 8.3 g/dL 7.2 7.1 7.3  Albumin 3.5 - 5.0 g/dL 3.9 - 4.4  AST 5 - 34 U/L 26 - 20  ALT 0 - 55 U/L 21 - 14  Alk Phosphatase 40 - 150 U/L 57 - 64  Total Bilirubin 0.20 - 1.20 mg/dL 1.21(H) - 1.0  Bilirubin, Direct 0.0 - 0.3 mg/dL - - -   CBC Latest Ref Rng & Units 07/07/2017 07/07/2017 04/09/2017  WBC 4.0 - 10.5 K/uL 6.5 8.1 4.6  Hemoglobin 12.0 - 15.0 g/dL 14.7 15.9(H) 15.0  Hematocrit 36.0 - 46.0 % 42.4 45.8 45.1  Platelets 150 - 400 K/uL 179 201 166   Lab Results  Component Value Date   MCV 91.4 07/07/2017   MCV 90.7 07/07/2017   MCV 92.0 04/09/2017   Lab Results  Component Value Date   TSH 1.62 11/27/2016   Lab Results  Component Value Date   HGBA1C 5.5 12/18/2016   Lipid Panel     Component Value Date/Time   CHOL 187 08/28/2016 1009   TRIG 209 (H) 08/28/2016 1009   HDL 59 08/28/2016 1009   CHOLHDL 3.2 08/28/2016 1009   VLDL 42 (H) 08/28/2016 1009   LDLCALC 86 08/28/2016 1009   LDLDIRECT 152.3 02/10/2012 1010    RADIOLOGY: US Breast Right  07/04/2013   CLINICAL DATA:  Abnormal screening right mammogram.  EXAM: DIGITAL DIAGNOSTIC  right MAMMOGRAM  ULTRASOUND right BREAST  COMPARISON:  With prior exams.  ACR Breast Density Category b: There are scattered areas of fibroglandular density.  FINDINGS: Spot compression views of the lateral aspect of the right breast were performed. There is persistence of a 4 mm low-density nodule in the posterior 3rd of the breast. There are no malignant type microcalcifications.  On physical exam I do not palpate a mass in the right breast.  Ultrasound is performed, showing there is a near anechoic lesion in the right breast at 7 o'clock 8 cm from the nipple measuring 3 x 2 x 3 mm.  IMPRESSION: Probable benign lesion in the right breast.  RECOMMENDATION: Short-term interval followup right mammogram and ultrasound in 6 months is recommended to document stability.  I have  discussed the findings and recommendations with the patient. Results were also provided in writing at the conclusion of the visit. If applicable, a reminder letter will be sent to the patient regarding the next appointment.  BI-RADS CATEGORY  3: Probably benign finding(s) - short interval follow-up suggested.   Electronically Signed   By: Lillia Mountain M.D.   On: 07/04/2013 09:01   Mm [place] R  07/04/2013   CLINICAL DATA:  Abnormal screening right mammogram.  EXAM: DIGITAL DIAGNOSTIC  right MAMMOGRAM  ULTRASOUND right BREAST  COMPARISON:  With prior exams.  ACR Breast Density Category b: There are scattered areas of fibroglandular density.  FINDINGS: Spot compression views of the lateral aspect of the right breast were performed. There is persistence of a 4 mm low-density nodule in the posterior 3rd of the breast. There are no malignant type microcalcifications.  On physical exam I do not palpate a mass in the right breast.  Ultrasound is performed, showing there is a near anechoic lesion in the right breast at 7 o'clock 8 cm from the nipple measuring 3 x 2 x 3 mm.  IMPRESSION: Probable benign lesion in the  right breast.  RECOMMENDATION: Short-term interval followup right mammogram and ultrasound in 6 months is recommended to document stability.  I have discussed the findings and recommendations with the patient. Results were also provided in writing at the conclusion of the visit. If applicable, a reminder letter will be sent to the patient regarding the next appointment.  BI-RADS CATEGORY  3: Probably benign finding(s) - short interval follow-up suggested.   Electronically Signed   By: Lillia Mountain M.D.   On: 07/04/2013 09:01   IMPRESSION:  1. Paroxysmal atrial fibrillation (HCC)   2. Essential hypertension   3. Anticoagulation adequate   4. Grade II diastolic dysfunction   5. Hiatal hernia with gastroesophageal reflux   6. Dysphagia, unspecified type      ASSESSMENT AND PLAN: Ms. Fickel is  an 83 year old female who has a history of hypertension, paroxysmal atrial fibrillation,  hyperlipidemia and peripheral vascular disease. She  is on eliquis for anticoagulation and tolerating this well.  She developed recurrent AF and underwent successful cardioversion during her emergency room evaluation on 01/15/2017.  Most recently, she developed recurrent A. fib despite taking metoprolol and sotalol 80 mg twice a day.  She had 2 failed attempts in the emergency room in early November when she presented with AF with RVR.  Ultimately, her sotalol dose was increased to 120 twice a day and she was able to be successfully cardioverted.  Her ECG today shows sinus rhythm at 60 bpm.  QTc interval is 452 ms.  Her blood pressure today is controlled on her regimen consisting of metoprolol 25 Mill grams twice a day and chlorthalidone 25 mg daily.  She has not had any bleeding on eliquis.  There is no significant edema.  She has had remote esophageal [facility] dictation.  Recently she has noticed that her food gets stuck.  She has a hiatal hernia.  I have suggested a follow-up GI evaluation.  She tells me her primary M.D. will be checking lipid studies. .  I will see her in 4 months for reevaluation or sooner if problems develop.  Time spent: 25 minutes  Troy Sine, MD, [facility]  08/24/2017 5:55 PM

## 2017-08-18 NOTE — Patient Instructions (Signed)
Medication Instructions:  Your physician recommends that you continue on your current medications as directed. Please refer to the Current Medication list given to you today.  Follow-Up: Your physician wants you to follow-up in: 4 months with Dr. Kelly.  You will receive a reminder letter in the mail two months in advance. If you don't receive a letter, please call our office to schedule the follow-up appointment.   Any Other Special Instructions Will Be Listed Below (If Applicable).     If you need a refill on your cardiac medications before your next appointment, please call your pharmacy.   

## 2017-08-24 ENCOUNTER — Encounter: Payer: Self-pay | Admitting: Cardiovascular Disease

## 2017-09-08 ENCOUNTER — Other Ambulatory Visit: Payer: Self-pay | Admitting: Family Medicine

## 2017-09-08 DIAGNOSIS — Z1231 Encounter for screening mammogram for malignant neoplasm of breast: Secondary | ICD-10-CM

## 2017-09-15 ENCOUNTER — Other Ambulatory Visit: Payer: Self-pay | Admitting: *Deleted

## 2017-09-15 ENCOUNTER — Other Ambulatory Visit: Payer: Self-pay | Admitting: Cardiovascular Disease

## 2017-09-15 ENCOUNTER — Telehealth: Payer: Self-pay | Admitting: Internal Medicine

## 2017-09-15 MED ORDER — SOTALOL HCL 120 MG PO TABS: 120.0000 mg | ORAL_TABLET | Freq: Two times a day (BID) | ORAL | 3 refills | Status: DC

## 2017-09-15 MED ORDER — POTASSIUM CHLORIDE CRYS ER 10 MEQ PO TBCR: 20.0000 meq | EXTENDED_RELEASE_TABLET | Freq: Every day | ORAL | 1 refills | Status: DC

## 2017-09-15 MED ORDER — RANITIDINE HCL 300 MG PO TABS: 300.0000 mg | ORAL_TABLET | Freq: Every morning | ORAL | 3 refills | Status: DC

## 2017-09-15 MED ORDER — METOPROLOL TARTRATE 25 MG PO TABS: 25.0000 mg | ORAL_TABLET | Freq: Two times a day (BID) | ORAL | 3 refills | Status: DC

## 2017-09-15 MED ORDER — APIXABAN 5 MG PO TABS: 5.0000 mg | ORAL_TABLET | Freq: Two times a day (BID) | ORAL | 3 refills | Status: DC

## 2017-09-15 NOTE — Telephone Encounter (Signed)
K is being rx and handle by pt cardiologist. Per chart they have reach out to pt concerning K prescription...[name]

## 2017-09-15 NOTE — Telephone Encounter (Signed)
Patient requesting refill of K-Dur, sig says daily, then says patient takes differently bid, will need new prescription sent to [facility].

## 2017-09-15 NOTE — Telephone Encounter (Deleted)
Copied from [place]. Topic: Quick Communication - Rx Refill/Question >> Sep 15, 2017  9:56 AM Patrice Paradise wrote: Medication:  gabapentin (NEURONTIN) 100 MG capsule (90 DAYS SUPPLY) chlorthalidone (HYGROTON) 25 MG tablet (90 DAYS SUPPLY)   Has the patient contacted their pharmacy? {yes [id number]}   (Agent: If no, request that the patient contact the pharmacy for the refill.)   Preferred Pharmacy (with phone number or street name):   [facility], [address] [place] [address] Phone: [phone] Fax: [phone]     Agent: Please be advised that RX refills may take up to 3 business days. We ask that you follow-up with your pharmacy.

## 2017-09-15 NOTE — Telephone Encounter (Signed)
LMTCB to clarify potassium dose/frequency   PCP refilled metoprolol tartrate, eliquis, zantac, sotalol 09/15/17 per EPIC

## 2017-09-15 NOTE — Telephone Encounter (Signed)
Follow up ° ° ° °Patient returning call.  Please call °

## 2017-09-15 NOTE — Telephone Encounter (Signed)
°*  STAT* If patient is at the pharmacy, call can be transferred to refill team.   1. Which medications need to be refilled? (please list name of each medication and dose if known)  metoprolol tartrate (LOPRESSOR) 25 MG tablet apixaban (ELIQUIS) 5 MG TABS tablet sotalol (BETAPACE) 120 MG tablet potassium chloride (K-DUR,KLOR-CON) 10 MEQ tablet ranitidine (ZANTAC) 300 MG tablet  2. Which pharmacy/location (including street and city if local pharmacy) is medication to be sent to? [facility]  3. Do they need a 30 day or 90 day supply? [place]

## 2017-09-15 NOTE — Telephone Encounter (Signed)
Per lab notes 07/17/17 patient is to take 20MEQ of Potassium daily per Roderic Palau, NP [facility]. Per repeat lab notes 07/21/17 patient is to continue with 20MEQ of Potassium Daily per Roderic Palau NP of [facility].

## 2017-09-16 MED ORDER — POTASSIUM CHLORIDE CRYS ER 10 MEQ PO TBCR: 20.0000 meq | EXTENDED_RELEASE_TABLET | Freq: Every day | ORAL | 2 refills | Status: DC

## 2017-10-07 ENCOUNTER — Ambulatory Visit
Admission: RE | Admit: 2017-10-07 | Discharge: 2017-10-07 | Disposition: A | Discharge status: Home / Self Care | Payer: Medicare HMO | Source: Ambulatory Visit | Attending: Family Medicine | Admitting: Family Medicine

## 2017-10-07 ENCOUNTER — Other Ambulatory Visit: Payer: Self-pay | Admitting: Internal Medicine

## 2017-10-07 DIAGNOSIS — Z1231 Encounter for screening mammogram for malignant neoplasm of breast: Secondary | ICD-10-CM | POA: Diagnosis not present

## 2017-11-24 ENCOUNTER — Other Ambulatory Visit: Payer: Self-pay | Admitting: Internal Medicine

## 2017-11-24 DIAGNOSIS — I10 Essential (primary) hypertension: Secondary | ICD-10-CM

## 2017-12-14 DIAGNOSIS — H5203 Hypermetropia, bilateral: Secondary | ICD-10-CM | POA: Diagnosis not present

## 2017-12-14 DIAGNOSIS — H2513 Age-related nuclear cataract, bilateral: Secondary | ICD-10-CM | POA: Diagnosis not present

## 2017-12-14 DIAGNOSIS — H04123 Dry eye syndrome of bilateral lacrimal glands: Secondary | ICD-10-CM | POA: Diagnosis not present

## 2017-12-14 DIAGNOSIS — H25013 Cortical age-related cataract, bilateral: Secondary | ICD-10-CM | POA: Diagnosis not present

## 2017-12-21 ENCOUNTER — Encounter: Payer: Self-pay | Admitting: Gastroenterology

## 2017-12-21 ENCOUNTER — Ambulatory Visit: Payer: Medicare HMO | Admitting: Gastroenterology

## 2017-12-21 VITALS — BP 152/70 | HR 64 | Ht 65.0 in | Wt 202.5 lb

## 2017-12-21 DIAGNOSIS — R1032 Left lower quadrant pain: Secondary | ICD-10-CM

## 2017-12-21 DIAGNOSIS — K56699 Other intestinal obstruction unspecified as to partial versus complete obstruction: Secondary | ICD-10-CM

## 2017-12-21 DIAGNOSIS — K581 Irritable bowel syndrome with constipation: Secondary | ICD-10-CM | POA: Diagnosis not present

## 2017-12-21 DIAGNOSIS — E739 Lactose intolerance, unspecified: Secondary | ICD-10-CM

## 2017-12-21 DIAGNOSIS — K580 Irritable bowel syndrome with diarrhea: Secondary | ICD-10-CM

## 2017-12-21 DIAGNOSIS — R140 Abdominal distension (gaseous): Secondary | ICD-10-CM

## 2017-12-21 MED ORDER — DIPHENOXYLATE-ATROPINE 2.5-0.025 MG PO TABS: 1.0000 | ORAL_TABLET | Freq: Two times a day (BID) | ORAL | 1 refills | Status: DC | PRN

## 2017-12-21 NOTE — Progress Notes (Signed)
[name]    [id number]    01-21-1933  Primary Care Physician:Jones, Arvid Right, MD  Referring Physician: Janith Lima, MD 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 Olevia Perches, was last seen November 15, 2014 is here to reestablish care. She has alternating constipation and diarrhea.  She has multiple bowel movements every other day with no bowel movement in between.  Occasionally she gets severe left lower quadrant abdominal pain which improves when she has multiple bowel movements.  She is drinking Lactaid milk but does not avoid cheese or other dairy products.  She feels more bloated after she drinks Slim fast, but does not like the convenience of drinking it.  also drinks sweet tea with artificial sweeteners, Splenda Last colonoscopy October 2012 showed minimal narrowing at sigmoid anastomosis 20 cm.  No recall due to her age. Patient denies any blood in stool.  No nausea, vomiting, dysphagia, loss of appetite or weight loss.    Outpatient Encounter Medications as of 12/21/2017  Medication Sig  . apixaban (ELIQUIS) 5 MG TABS tablet Take 1 tablet (5 mg total) by mouth 2 (two) times daily.  . Calcium Carbonate-Vitamin D (CALCIUM 600 + D PO) Take by mouth daily.   . chlorthalidone (HYGROTON) 25 MG tablet TAKE 1 TABLET BY MOUTH DAILY  . Cyanocobalamin 2500 MCG CHEW Chew 1 tablet by mouth daily.  . Dietary Management Product (FOSTEUM PLUS) CAPS Take 1 capsule by mouth 2 (two) times daily.  Marland Kitchen gabapentin (NEURONTIN) 100 MG capsule Take 100 mg by mouth 3 (three) times daily.  Marland Kitchen GARLIC PO Take 1 tablet daily by mouth.  . hydrocortisone cream 1 % Apply 1 application 3 (three) times daily as needed topically for itching  (skin irritation).  . metoprolol tartrate (LOPRESSOR) 25 MG tablet Take 1 tablet (25 mg total) by mouth 2 (two) times daily.  . Multiple Vitamin (MULTIVITAMIN) tablet Take 1 tablet by mouth daily.  . potassium chloride (K-DUR,KLOR-CON) 10 MEQ tablet Take 2 tablets (20 mEq total) by mouth daily.  . ranitidine (ZANTAC) 300 MG tablet Take 1 tablet (300 mg total) by mouth every morning.  . sotalol (BETAPACE) 120 MG tablet Take 1 tablet (120 mg total) by mouth every 12 (twelve) hours.   No facility-administered encounter medications on file as of 12/21/2017.     Allergies as of 12/21/2017 - Review Complete 12/21/2017  Allergen Reaction Noted  . Crestor [rosuvastatin calcium] Other (See Comments) 12/25/2016  . Acetaminophen-codeine    . Amlodipine besylate  11/09/2008  . Diltiazem hcl  11/09/2008  . Hydrocodone-acetaminophen    . Irbesartan-hydrochlorothiazide  11/09/2008  . Lisinopril    . Omeprazole Nausea Only and Other (See Comments) 11/15/2014  . Propoxyphene n-acetaminophen    . Valsartan  11/09/2008  . Warfarin sodium  11/09/2008  . Verapamil Nausea Only, Palpitations, and Other (See Comments) 06/02/2011    Past Medical History:  Diagnosis Date  . Abnormal nuclear stress test    mild anterolateral septal and inferior ischemia  . Arthritis   . Atherosclerosis of lower extremity with claudication ([facility]) 04/17/2015   LEFT LEG  . Atrial fibrillation ([facility])   . Claudication ([facility])   . Colon cancer ([facility])   . Coronary atherosclerosis of  unspecified type of vessel, native or graft   . Esophageal stricture   . Fatty liver 11/05/11  . Hiatal hernia   . HLD (hyperlipidemia)   . Hypertension   . Iron deficiency anemia, unspecified   . Ischemic colitis ([facility])   . Neuropathy   . Osteoporosis   . PAF (paroxysmal atrial fibrillation) ([facility]) 04/2015  . Unspecified vascular insufficiency of intestine     Past Surgical History:  Procedure Laterality Date  . abdominoperineal resection  anastomotic stricture  05/2007  . APPENDECTOMY    . CARDIAC CATHETERIZATION N/A 03/13/2015   Procedure: Left Heart Cath and Coronary Angiography;  Surgeon: Troy Sine, MD;  Location: [facility];  Service: Cardiovascular;  Laterality: N/A;  . CARDIOVERSION N/A 04/20/2015   Procedure: CARDIOVERSION;  Surgeon: Jerline Pain, MD;  Location: [facility];  Service: Cardiovascular;  Laterality: N/A;  . CARDIOVERSION N/A 07/10/2017   Procedure: CARDIOVERSION;  Surgeon: Jerline Pain, MD;  Location: [facility];  Service: Cardiovascular;  Laterality: N/A;  . [place]  . ESOPHAGOGASTRODUODENOSCOPY N/A 04/27/2014   Procedure: ESOPHAGOGASTRODUODENOSCOPY (EGD);  Surgeon: Lafayette Dragon, MD;  Location: [facility];  Service: Endoscopy;  Laterality: N/A;  . Mandibular Renstruction    . ROTATOR CUFF REPAIR  2006   left  . Rt. Salpingo oophorectomy and cyst removal  2005  . SAVORY DILATION N/A 04/27/2014   Procedure: SAVORY DILATION;  Surgeon: Lafayette Dragon, MD;  Location: [facility];  Service: Endoscopy;  Laterality: N/A;  no xray needed  . sigmoid resection for invasive rectal adenocarcinoma  12/2006  . TEE WITHOUT CARDIOVERSION N/A 04/20/2015   Procedure: TRANSESOPHAGEAL ECHOCARDIOGRAM (TEE);  Surgeon: Jerline Pain, MD;  Location: [facility];  Service: Cardiovascular;  Laterality: N/A;  . TUBAL LIGATION    . WRIST SURGERY  2008   right    Family History  Problem Relation Age of Onset  . Colon cancer Father 27  . Heart disease Father   . Hypertension Father   . Alzheimer's disease Mother   . Thyroid disease Mother   . Hypertension Sister   . Thyroid disease Sister   . Mitral valve prolapse Sister   . Hypertension Son   . Diabetes Son   . Hypertension Son   . Diabetes Son   . Esophageal cancer Neg Hx   . Rectal cancer Neg Hx   . Stomach cancer Neg Hx     Social History   Socioeconomic History  . Marital status: Widowed    Spouse name: Not on  file  . Number of children: 4  . Years of education: 12th grade  . Highest education level: Not on file  Occupational History  . Occupation: retired    Fish farm manager: RETIRED  Social Needs  . Financial resource strain: Not on file  . Food insecurity:    Worry: Not on file    Inability: Not on file  . Transportation needs:    Medical: Not on file    Non-medical: Not on file  Tobacco Use  . Smoking status: Never Smoker  . Smokeless tobacco: Never Used  Substance and Sexual Activity  . Alcohol use: No  . Drug use: No  . Sexual activity: Not Currently  Lifestyle  . Physical activity:    Days per week: Not on file    Minutes per session: Not on file  . Stress: Not on file  Relationships  . Social connections:    Talks  on phone: Not on file    Gets together: Not on file    Attends religious service: Not on file    Active member of club or organization: Not on file    Attends meetings of clubs or organizations: Not on file    Relationship status: Not on file  . Intimate partner violence:    Fear of current or ex partner: Not on file    Emotionally abused: Not on file    Physically abused: Not on file    Forced sexual activity: Not on file  Other Topics Concern  . Not on file  Social History Narrative   Lives at home with fiance.   Right-handed.   No caffeine use.      Review of systems: Review of Systems  Constitutional: Negative for fever and chills.  HENT: Difficulty hearing, ringing in ears Eyes: Negative for blurred vision.  Respiratory: Negative for cough, shortness of breath and wheezing.   Cardiovascular: Negative for chest pain and palpitations.  Gastrointestinal: as per HPI Genitourinary: Negative for dysuria, urgency, frequency and hematuria.  Musculoskeletal: Positive for myalgias, back pain and joint pain.  Skin: Negative for itching and rash.  Neurological: Negative for dizziness, tremors, focal weakness, seizures and loss of consciousness.    Endo/Heme/Allergies: Negative for seasonal allergies.  Psychiatric/Behavioral: Negative for depression, suicidal ideas and hallucinations.  All other systems reviewed and are negative.   Physical Exam: Vitals:   12/21/17 0950  BP: (!) 152/70  Pulse: 64   Body mass index is 33.7 kg/m. Gen:      No acute distress HEENT:  EOMI, sclera anicteric Neck:     No masses; no thyromegaly Lungs:    Clear to auscultation bilaterally; normal respiratory effort CV:         Regular rate and rhythm; no murmurs Abd:      + bowel sounds; soft, non-tender; no palpable masses, no distension Ext:    No edema; adequate peripheral perfusion Skin:      Warm and dry; no rash Neuro: alert and oriented x 3 Psych: normal mood and affect  Data Reviewed:  Reviewed labs, radiology imaging, old records and pertinent past GI work up   Assessment and Plan/Recommendations:  84 year old female with history of chronic GERD, esophageal stricture status post dilation, sigmoid colon cancer in 2008 status post resection with anastomotic stricture at 20 cm anal verge here with complaints of intermittent abdominal pain, constipation alternating with episodes of severe diarrhea Her symptoms are likely related to the sigmoid stricture Advised patient to avoid high fiber diet, and instead do small frequent meals with low residue Avoid artificial sweeteners and lactose Use Lomotil 1 tablet every 12 hours as needed when she has severe diarrhea If continues to have persistent symptoms despite dietary changes, will consider flexible sigmoidoscopy to further evaluate and possible dilation of anastomotic stricture if needed Return in 2 months or sooner if needed  Greater than 50% of the time used for counseling as well as treatment plan and follow-up. She had multiple questions which were answered to her satisfaction  K. Denzil Magnuson , MD [phone]    CC: Janith Lima, MD

## 2017-12-21 NOTE — Patient Instructions (Addendum)
Lactose-Free Diet, Adult If you have lactose intolerance, you are not able to digest lactose. Lactose is a natural sugar found mainly in milk and milk products. You may need to avoid all foods and beverages that contain lactose. A lactose-free diet can help you do this. What do I need to know about this diet?  Do not consume foods, beverages, vitamins, minerals, or medicines with lactose. Read ingredients lists carefully.  Look for the words "lactose-free" on labels.  Use lactase enzyme drops or tablets as directed by your health care provider.  Use lactose-free milk or a milk alternative, such as soy milk, for drinking and cooking.  Make sure you get enough calcium and vitamin D in your diet. A lactose-free eating plan can be lacking in these important nutrients.  Take calcium and vitamin D supplements as directed by your health care provider. Talk to your provider about supplements if you are not able to get enough calcium and vitamin D from food. Which foods have lactose? Lactose is found in:  Milk and foods made from milk.  Yogurt.  Cheese.  Butter.  Margarine.  Sour cream.  Cream.  Whipped toppings and nondairy creamers.  Ice cream and other milk-based desserts.  Lactose is also found in foods or products made with milk or milk ingredients. To find out whether a food contains milk or a milk ingredient, look at the ingredients list. Avoid foods with the statement "May contain milk" and foods that contain:  Butter.  Cream.  Milk.  Milk solids.  Milk powder.  Whey.  Curd.  Caseinate.  Lactose.  Lactalbumin.  Lactoglobulin.  What are some alternatives to milk and foods made with milk products?  Lactose-free milk.  Soy milk with added calcium and vitamin D.  Almond, coconut, or rice milk with added calcium and vitamin D. Note that these are low in protein.  Soy products, such as soy yogurt, soy cheese, soy ice cream, and soy-based sour cream. Which  foods can I eat? Grains Breads and rolls made without milk, such as [place], [place], or [place] bread, bagels, pita, and [employer]. Corn tortillas, corn meal, grits, and polenta. Crackers without lactose or milk solids, such as soda crackers and graham crackers. Cooked or dry cereals without lactose or milk solids. Pasta, quinoa, couscous, barley, oats, bulgur, farro, rice, wild rice, or other grains prepared without milk or lactose. Plain popcorn. Vegetables Fresh, frozen, and canned vegetables without cheese, cream, or butter sauces. Fruits All fresh, canned, frozen, or dried fruits that are not processed with lactose. Meats and Other Protein Sources Plain beef, chicken, fish, [place], lamb, veal, pork, wild game, or ham. Kosher-prepared meat products. Strained or junior meats that do not contain milk. Eggs. Soy meat substitutes. Beans, lentils, and hummus. Tofu. Nuts and seeds. Peanut or other nut butters without lactose. Soups, casseroles, and mixed dishes without cheese, cream, or milk. Dairy Lactose-free milk. Soy, rice, or almond milk with added calcium and vitamin D. Soy cheese and yogurt. Beverages Carbonated drinks. Tea. Coffee, freeze-dried coffee, and some instant coffees. Fruit and vegetable juices. Condiments Soy sauce. Carob powder. Olives. Gravy made with water. Baker's cocoa. Angie Fava. Pure seasonings and spices. Ketchup. Mustard. Bouillon. Broth. Sweets and Desserts Water and fruit ices. Gelatin. Cookies, pies, or cakes made from allowed ingredients, such as angel food cake. Pudding made with water or a milk substitute. Lactose-free tofu desserts. Soy, coconut milk, or rice-milk-based frozen desserts. Sugar. Honey. Jam, jelly, and marmalade. Molasses. Pure sugar candy. Dark chocolate without  milk. Marshmallows. Fats and Oils Margarines and salad dressings that do not contain milk. Berniece Salines. Vegetable oils. Shortening. Mayonnaise. Soy or coconut-based cream. The items listed above may not  be a complete list of recommended foods or beverages. Contact your dietitian for more options. Which foods are not recommended? Grains Breads and rolls that contain milk. Toaster pastries. Muffins, biscuits, waffles, cornbread, and pancakes. These can be prepared at home, commercial, or from mixes. Sweet rolls, donuts, English muffins, fry bread, lefse, flour tortillas with lactose, or [place] toast made with milk or milk ingredients. Crackers that contain lactose. Corn curls. Cooked or dry cereals with lactose. Vegetables Creamed or breaded vegetables. Vegetables in a cheese or butter sauce or with lactose-containing margarines. Instant potatoes. [place] fries. Scalloped or au gratin potatoes. Fruits None. Meats and Other Protein Sources Scrambled eggs, omelets, and souffles that contain milk. Creamed or breaded meat, fish, chicken, or [place]. Sausage products, such as wieners and liver sausage. Cold cuts that contain milk solids. Cheese, cottage cheese, ricotta cheese, and cheese spreads. Lasagna and macaroni and cheese. Pizza. Peanut or other nut butters with added milk solids. Casseroles or mixed dishes containing milk or cheese. Dairy All dairy products, including milk, goat's milk, buttermilk, kefir, acidophilus milk, flavored milk, evaporated milk, condensed milk, dulce de Vici, eggnog, yogurt, cheese, and cheese spreads. Beverages Hot chocolate. Cocoa with lactose. Instant iced teas. Powdered fruit drinks. Smoothies made with milk or yogurt. Condiments Chewing gum that has lactose. Cocoa that has lactose. Spice blends if they contain milk products. Artificial sweeteners that contain lactose. Nondairy creamers. Sweets and Desserts Ice cream, ice milk, gelato, sherbet, and frozen yogurt. Custard, pudding, and mousse. Cake, cream pies, cookies, and other desserts containing milk, cream, cream cheese, or milk chocolate. Pie crust made with milk-containing margarine or butter. Reduced-calorie  desserts made with a sugar substitute that contains lactose. Toffee and butterscotch. Milk, white, or dark chocolate that contains milk. Fudge. Caramel. Fats and Oils Margarines and salad dressings that contain milk or cheese. Cream. Half and half. Cream cheese. Sour cream. Chip dips made with sour cream or yogurt. The items listed above may not be a complete list of foods and beverages to avoid. Contact your dietitian for more information. Am I getting enough calcium? Calcium is found in many foods that contain lactose and is important for bone health. The amount of calcium you need depends on your age:  Adults younger than 50 years: 1000 mg of calcium a day.  Adults older than 50 years: 1200 mg of calcium a day.  If you are not getting enough calcium, other calcium sources include:  Orange juice with calcium added. There are 300-350 mg of calcium in 1 cup of orange juice.  Sardines with edible bones. There are 325 mg of calcium in 3 oz of sardines.  Calcium-fortified soy milk. There are 300-400 mg of calcium in 1 cup of calcium-fortified soy milk.  Calcium-fortified rice or almond milk. There are 300 mg of calcium in 1 cup of calcium-fortified rice or almond milk.  Canned salmon with edible bones. There are 180 mg of calcium in 3 oz of canned salmon with edible bones.  Calcium-fortified breakfast cereals. There are [phone] mg of calcium in calcium-fortified breakfast cereals.  Tofu set with calcium sulfate. There are 250 mg of calcium in  cup of tofu set with calcium sulfate.  Spinach, cooked. There are 145 mg of calcium in  cup of cooked spinach.  Edamame, cooked. There are 130 mg of  calcium in  cup of cooked edamame.  Collard greens, cooked. There are 125 mg of calcium in  cup of cooked collard greens.  Kale, frozen or cooked. There are 90 mg of calcium in  cup of cooked or frozen kale.  Almonds. There are 95 mg of calcium in  cup of almonds.  Broccoli, cooked. There  are 60 mg of calcium in 1 cup of cooked broccoli.  This information is not intended to replace advice given to you by your health care provider. Make sure you discuss any questions you have with your health care provider. Document Released: 02/07/2002 Document Revised: 01/24/2016 Document Reviewed: 11/18/2013 Elsevier Interactive Patient Education  2018 [employer].  Avoid artificial sweeteners  Take benefiber 1 teaspoon once or twice daily (avoid raw veggies) ex salads   Take IB gard 1 capsule three times a day as needed  We will send Lomotil 1 capsule twice a day as needed    If you are age 82 or older, your body mass index should be between 23-30. Your Body mass index is 33.7 kg/m. If this is out of the aforementioned range listed, please consider follow up with your Primary Care Provider.  If you are age 36 or younger, your body mass index should be between 19-25. Your Body mass index is 33.7 kg/m. If this is out of the aformentioned range listed, please consider follow up with your Primary Care Provider.

## 2017-12-28 ENCOUNTER — Ambulatory Visit: Payer: Medicare HMO | Admitting: Internal Medicine

## 2017-12-28 ENCOUNTER — Encounter: Payer: Self-pay | Admitting: Internal Medicine

## 2017-12-28 ENCOUNTER — Ambulatory Visit (INDEPENDENT_AMBULATORY_CARE_PROVIDER_SITE_OTHER): Payer: Medicare HMO | Admitting: Internal Medicine

## 2017-12-28 ENCOUNTER — Other Ambulatory Visit (INDEPENDENT_AMBULATORY_CARE_PROVIDER_SITE_OTHER): Payer: Medicare HMO

## 2017-12-28 VITALS — BP 142/78 | HR 58 | Temp 98.0°F | Resp 16 | Ht 65.0 in | Wt 200.5 lb

## 2017-12-28 DIAGNOSIS — E538 Deficiency of other specified B group vitamins: Secondary | ICD-10-CM | POA: Diagnosis not present

## 2017-12-28 DIAGNOSIS — E785 Hyperlipidemia, unspecified: Secondary | ICD-10-CM

## 2017-12-28 DIAGNOSIS — G320 Subacute combined degeneration of spinal cord in diseases classified elsewhere: Secondary | ICD-10-CM | POA: Diagnosis not present

## 2017-12-28 DIAGNOSIS — I480 Paroxysmal atrial fibrillation: Secondary | ICD-10-CM | POA: Diagnosis not present

## 2017-12-28 DIAGNOSIS — I10 Essential (primary) hypertension: Secondary | ICD-10-CM

## 2017-12-28 LAB — COMPREHENSIVE METABOLIC PANEL
ALBUMIN: 4.2 g/dL (ref 3.5–5.2)
ALT: 14 U/L (ref 0–35)
AST: 19 U/L (ref 0–37)
Alkaline Phosphatase: 48 U/L (ref 39–117)
BILIRUBIN TOTAL: 1.1 mg/dL (ref 0.2–1.2)
BUN: 20 mg/dL (ref 6–23)
CALCIUM: 9.9 mg/dL (ref 8.4–10.5)
CHLORIDE: 102 meq/L (ref 96–112)
CO2: 32 mEq/L (ref 19–32)
CREATININE: 0.81 mg/dL (ref 0.40–1.20)
GFR: 71.76 mL/min (ref >60.00)
Glucose, Bld: 93 mg/dL (ref 70–99)
Potassium: 3.5 mEq/L (ref 3.5–5.1)
SODIUM: 141 meq/L (ref 135–145)
Total Protein: 7.2 g/dL (ref 6.0–8.3)

## 2017-12-28 LAB — CBC WITH DIFFERENTIAL/PLATELET
BASOS ABS: 0 10*3/uL (ref 0.0–0.1)
BASOS PCT: 0.7 % (ref 0.0–3.0)
EOS ABS: 0.1 10*3/uL (ref 0.0–0.7)
Eosinophils Relative: 2.6 % (ref 0.0–5.0)
HEMATOCRIT: 44.8 % (ref 36.0–46.0)
HEMOGLOBIN: 15.7 g/dL — AB (ref 12.0–15.0)
LYMPHS PCT: 36.8 % (ref 12.0–46.0)
Lymphs Abs: 1.8 10*3/uL (ref 0.7–4.0)
MCHC: 35 g/dL (ref 30.0–36.0)
MCV: 91.5 fl (ref 78.0–100.0)
Monocytes Absolute: 0.6 10*3/uL (ref 0.1–1.0)
Monocytes Relative: 11.1 % (ref 3.0–12.0)
Neutro Abs: 2.4 10*3/uL (ref 1.4–7.7)
Neutrophils Relative %: 48.8 % (ref 43.0–77.0)
Platelets: 189 10*3/uL (ref 150.0–400.0)
RBC: 4.9 Mil/uL (ref 3.87–5.11)
RDW: 13.4 % (ref 11.5–15.5)
WBC: 5 10*3/uL (ref 4.0–10.5)

## 2017-12-28 LAB — LIPID PANEL
CHOL/HDL RATIO: 5
Cholesterol: 229 mg/dL — ABNORMAL HIGH (ref 0–200)
HDL: 43.1 mg/dL (ref >39.00)
NONHDL: 185.53
Triglycerides: 340 mg/dL — ABNORMAL HIGH (ref 0.0–149.0)
VLDL: 68 mg/dL — AB (ref 0.0–40.0)

## 2017-12-28 LAB — LDL CHOLESTEROL, DIRECT: LDL DIRECT: 150 mg/dL

## 2017-12-28 LAB — C-REACTIVE PROTEIN: CRP: 0.2 mg/dL — ABNORMAL LOW (ref 0.5–20.0)

## 2017-12-28 LAB — VITAMIN D 25 HYDROXY (VIT D DEFICIENCY, FRACTURES): VITD: 43.01 ng/mL (ref 30.00–100.00)

## 2017-12-28 LAB — CK: Total CK: 112 U/L (ref 7–177)

## 2017-12-28 NOTE — Progress Notes (Signed)
Subjective:  Patient ID: [name], female    DOB: 01/28/1933  Age: 84 y.o. MRN: [id number]  CC: Hypertension   HPI [name] presents for f/up - she continues to complain of shooting pains throughout her body including her feet, her arms, her flank, and her wrists.  She has had no episodes of redness or swelling in any of her joints.  She tells me the numbness in her toes is getting better.  She reports that her blood pressure has been well controlled and she has had no recent episodes of palpitations, chest pain, shortness of breath, or lightheadedness, or dizziness.  Outpatient Medications Prior to Visit  Medication Sig Dispense Refill  . apixaban (ELIQUIS) 5 MG TABS tablet Take 1 tablet (5 mg total) by mouth 2 (two) times daily. 180 tablet 3  . Calcium Carbonate-Vitamin D (CALCIUM 600 + D PO) Take by mouth daily.     . chlorthalidone (HYGROTON) 25 MG tablet TAKE 1 TABLET BY MOUTH DAILY 90 tablet 0  . Cyanocobalamin 2500 MCG CHEW Chew 1 tablet by mouth daily.    . Dietary Management Product (FOSTEUM PLUS) CAPS Take 1 capsule by mouth 2 (two) times daily. 180 capsule 0  . diphenoxylate-atropine (LOMOTIL) 2.5-0.025 MG tablet Take 1 tablet by mouth 2 (two) times daily as needed for diarrhea or loose stools. 60 tablet 1  . gabapentin (NEURONTIN) 100 MG capsule Take 100 mg by mouth 3 (three) times daily.    Marland Kitchen GARLIC PO Take 1 tablet daily by mouth.    . hydrocortisone cream 1 % Apply 1 application 3 (three) times daily as needed topically for itching (skin irritation). 30 g 0  . metoprolol tartrate (LOPRESSOR) 25 MG tablet Take 1 tablet (25 mg total) by mouth 2 (two) times daily. 180 tablet 3  . Multiple Vitamin (MULTIVITAMIN) tablet Take 1 tablet by mouth daily.    . potassium chloride (K-DUR,KLOR-CON) 10 MEQ tablet Take 2 tablets (20 mEq total) by mouth daily. 180 tablet 2  . ranitidine (ZANTAC) 300 MG tablet Take 1 tablet (300 mg total) by mouth every morning. 90 tablet 3  .  sotalol (BETAPACE) 120 MG tablet Take 1 tablet (120 mg total) by mouth every 12 (twelve) hours. 180 tablet 3   No facility-administered medications prior to visit.     ROS Review of Systems  Constitutional: Negative.  Negative for appetite change, diaphoresis, fatigue, fever and unexpected weight change.  HENT: Negative.   Eyes: Negative for visual disturbance.  Respiratory: Negative for cough, chest tightness, shortness of breath and wheezing.   Cardiovascular: Negative for chest pain, palpitations and leg swelling.  Gastrointestinal: Negative for abdominal pain, constipation, diarrhea, nausea and vomiting.  Endocrine: Negative.   Genitourinary: Negative.  Negative for difficulty urinating.  Musculoskeletal: Positive for myalgias. Negative for arthralgias, back pain and neck pain.  Skin: Negative for color change and pallor.  Allergic/Immunologic: Negative.   Neurological: Positive for numbness. Negative for dizziness, syncope, facial asymmetry, weakness and light-headedness.  Hematological: Negative for adenopathy. Does not bruise/bleed easily.  Psychiatric/Behavioral: Negative.  Negative for dysphoric mood and sleep disturbance. The patient is not nervous/anxious.     Objective:  BP (!) 142/78 (BP Location: Left Arm, Patient Position: Sitting, Cuff Size: Normal)   Pulse (!) 58   Temp 98 F (36.7 C) (Oral)   Resp 16   Ht 5\' 5"  (1.651 m)   Wt 200 lb 8 oz (90.9 kg)   SpO2 96%   BMI 33.36  kg/m   BP Readings from Last 3 Encounters:  12/28/17 (!) 142/78  12/21/17 (!) 152/70  08/18/17 122/64    Wt Readings from Last 3 Encounters:  12/28/17 200 lb 8 oz (90.9 kg)  12/21/17 202 lb 8 oz (91.9 kg)  08/18/17 202 lb (91.6 kg)    Physical Exam  Constitutional: She is oriented to person, place, and time. No distress.  HENT:  Mouth/Throat: Oropharynx is clear and moist. No oropharyngeal exudate.  Eyes: Conjunctivae are normal. No scleral icterus.  Neck: Normal range of motion.  Neck supple. No JVD present. No thyromegaly present.  Cardiovascular: Normal rate, regular rhythm and normal heart sounds. Exam reveals no gallop and no friction rub.  No murmur heard. Pulmonary/Chest: Effort normal and breath sounds normal. No stridor. No respiratory distress. She has no wheezes. She has no rales.  Abdominal: Soft. Bowel sounds are normal. She exhibits no distension and no mass. There is no tenderness.  Musculoskeletal: Normal range of motion. She exhibits no edema, tenderness or deformity.  Lymphadenopathy:    She has no cervical adenopathy.  Neurological: She is alert and oriented to person, place, and time. She has normal reflexes. She displays no atrophy, no tremor and normal reflexes. No cranial nerve deficit or sensory deficit. She exhibits normal muscle tone. Coordination and gait normal.  Skin: Skin is warm and dry. She is not diaphoretic.  Psychiatric: She has a normal mood and affect.  Vitals reviewed.   Lab Results  Component Value Date   WBC 5.0 12/28/2017   HGB 15.7 (H) 12/28/2017   HCT 44.8 12/28/2017   PLT 189.0 12/28/2017   GLUCOSE 93 12/28/2017   CHOL 229 (H) 12/28/2017   TRIG 340.0 (H) 12/28/2017   HDL 43.10 12/28/2017   LDLDIRECT 150.0 12/28/2017   LDLCALC 86 08/28/2016   ALT 14 12/28/2017   AST 19 12/28/2017   NA 141 12/28/2017   K 3.5 12/28/2017   CL 102 12/28/2017   CREATININE 0.81 12/28/2017   BUN 20 12/28/2017   CO2 32 12/28/2017   TSH 1.62 11/27/2016   INR 1.02 07/07/2017   HGBA1C 5.5 12/18/2016    Mm Screening Breast Tomo Bilateral  Result Date: 10/07/2017 CLINICAL DATA:  Screening. EXAM: 2D DIGITAL SCREENING BILATERAL MAMMOGRAM WITH 3D TOMO WITH CAD COMPARISON:  Previous exam(s). ACR Breast Density Category b: There are scattered areas of fibroglandular density. FINDINGS: There are no findings suspicious for malignancy. Images were processed with CAD. IMPRESSION: No mammographic evidence of malignancy. A result letter of this  screening mammogram will be mailed directly to the patient. RECOMMENDATION: Screening mammogram in one year. (Code:SM-B-01Y) BI-RADS CATEGORY  1: Negative. Electronically Signed   By: Lajean Manes M.D.   On: 10/07/2017 11:20    Assessment & Plan:   [place] was seen today for hypertension.  Diagnoses and all orders for this visit:  Essential hypertension- Her blood pressure is adequately well controlled.  Electrolytes and renal function are normal.  Her vitamin D level is in the normal range. -     Comprehensive metabolic panel; Future -     CBC with Differential/Platelet; Future -     VITAMIN D 25 Hydroxy (Vit-D Deficiency, Fractures); Future  Dyslipidemia, goal LDL below 100- She has not achieved her LDL goal but is also not willing to take a statin. 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  Her CK and CRP are  negative for any evidence of inflammatory arthritis or myopathy. -     CK; Future -     C-reactive protein; Future  Paroxysmal atrial fibrillation ([place])- She is maintaining sinus rhythm status post cardioversion.  Will continue anticoagulation with Eliquis.   I am having Evalie Hargraves. Quin maintain her Calcium Carbonate-Vitamin D (CALCIUM 600 + D PO), gabapentin, GARLIC PO, hydrocortisone cream, apixaban, metoprolol tartrate, ranitidine, sotalol, potassium chloride, FOSTEUM PLUS, chlorthalidone, multivitamin, Cyanocobalamin, and diphenoxylate-atropine.  No orders of the defined types were placed in this encounter.    Follow-up: Return in about 3 months (around 03/29/2018).  [name], MD

## 2017-12-28 NOTE — Patient Instructions (Signed)

## 2018-01-05 DIAGNOSIS — H9313 Tinnitus, bilateral: Secondary | ICD-10-CM | POA: Diagnosis not present

## 2018-01-05 DIAGNOSIS — H90A21 Sensorineural hearing loss, unilateral, right ear, with restricted hearing on the contralateral side: Secondary | ICD-10-CM | POA: Diagnosis not present

## 2018-01-05 DIAGNOSIS — H90A32 Mixed conductive and sensorineural hearing loss, unilateral, left ear with restricted hearing on the contralateral side: Secondary | ICD-10-CM | POA: Diagnosis not present

## 2018-02-01 ENCOUNTER — Telehealth: Payer: Self-pay | Admitting: Emergency Medicine

## 2018-02-01 NOTE — Telephone Encounter (Signed)
Called patient to schedule AWV. Patient declined at this time. 

## 2018-02-08 ENCOUNTER — Ambulatory Visit: Payer: Medicare HMO | Admitting: Cardiovascular Disease

## 2018-02-08 ENCOUNTER — Encounter: Payer: Self-pay | Admitting: Cardiovascular Disease

## 2018-02-08 VITALS — BP 136/82 | HR 56 | Ht 65.0 in | Wt 200.0 lb

## 2018-02-08 DIAGNOSIS — G609 Hereditary and idiopathic neuropathy, unspecified: Secondary | ICD-10-CM | POA: Diagnosis not present

## 2018-02-08 DIAGNOSIS — Z7901 Long term (current) use of anticoagulants: Secondary | ICD-10-CM | POA: Diagnosis not present

## 2018-02-08 DIAGNOSIS — I739 Peripheral vascular disease, unspecified: Secondary | ICD-10-CM

## 2018-02-08 DIAGNOSIS — I519 Heart disease, unspecified: Secondary | ICD-10-CM

## 2018-02-08 DIAGNOSIS — I480 Paroxysmal atrial fibrillation: Secondary | ICD-10-CM

## 2018-02-08 DIAGNOSIS — I10 Essential (primary) hypertension: Secondary | ICD-10-CM | POA: Diagnosis not present

## 2018-02-08 DIAGNOSIS — I5189 Other ill-defined heart diseases: Secondary | ICD-10-CM

## 2018-02-08 MED ORDER — PITAVASTATIN CALCIUM 2 MG PO TABS: ORAL_TABLET | ORAL | 0 refills | Status: DC

## 2018-02-08 NOTE — Progress Notes (Signed)
Patient ID: [name], female   DOB: [birth year], 84 y.o.   MRN: [id number]    PCP: Dr. Eilleen Kempf  HPI: [name] is a 84 y.o. female who presents for a 7 month cardiology evaluation.  [name] has a history of paroxysmal atrial fibrillation, hypertension, as well as hyperlipidemia. Remotely, she developed myalgias with simvastatin and had not been willing to try additional lipid lowering therapy. In August 2012 an echo Doppler study showed mild asymmetric LVH with proximal septal thickening without LVOT obstruction. She had grade 1 diastolic dysfunction with normal systolic function, mild MR, mild TR, and aortic valve sclerosis with mild MR.   In December 2014 follow-up blood work showed normal renal function with a BUN of 19, creatinine 0.8.  She continued to have significant hyperlipidemia with a total cholesterol of 250, triglycerides 221 HDL 53, VLDL 44 and LDL cholesterol 153.  TSH was normal at 2.275.   [name]  had noticed development of claudication, particularly involving her left leg with activity.  She had episodes of shortness of breath and fatigue.  She  underwent a lower extremity arterial Doppler study which was abnormal and showed an ABI of 0.63 on the left and 1.0 on the right.  The left common iliac, external iliac, common femoral artery and profunda demonstrated multiphasic flow.  However, the superficial femoral artery on the left demonstrated occlusive disease in the proximal to mid thigh with reconstitution of flow in the mid distal segment.  The popliteal artery demonstrated patent monophasic flow with three-vessel runoff.  The anterior tibial artery demonstrated focal stenosis in the proximal calf with dampened flow distal to this.  She presented to [facility] in August 2016 with complaints of palpitations, shortness of breath and presyncope.  She was noted to be in atrial fibrillation with rapid ventricular response.  Her heart rate was initially attempted to  be controlled with metoprolol and digitoxin, which was not successful and ultimately sotalol was started.  On 04/20/2015 she underwent successful TEE guided cardioversion.  She was continued on metoprolol as well as eliquis for anticoagulation.  An echo Doppler study on 04/18/2015 showed an EF of 55-60% with mild LVH.  She has multiple allergies and in the past did not tolerate ACE-inhibition or ARB therapy has been able to tolerate direct renin inhibition.  She has a history of GERD and has been taking ranitidine and sucralfate.  She has a history of hyperlipidemia and has had issues with statins.  When I saw her in October 2017 she was maintaining sinus rhythm.  She  presented to the emergency room on 01/15/2017 with chest tightness, shortness of breath and palpitations.  She was found to be back in atrial fibrillation.  She has continued to take anticoagulation with eloquence 5 mg twice a day.  During that evaluation, she underwent successful cardioversion and sinus rhythm was restored with 200 J cardioversion.   When I last saw her in May 2018, she was maintaining sinus rhythm.  I discussed the possibility of sleep apnea in the etiology of her recurrent AF but she did not want to pursue a sleep evaluation.  She underwent a follow-up echo Doppler study on 02/06/2017 which showed an EF of 60-65%.  There was moderate LVH, grade 2 diastolic dysfunction, and she was without major valvular abnormalities.  She developed recurrent atrial fibrillation with mild chest pain.  She failed 2 attempts to cardioversion in the emergency room at 200 J.  She ultimately was seen  by Dr. Rayann Heman in consultation and although her QTc was mildly prolonged, he felt that it was stable.  As result sotalol was increased to 120 mg twice a day.  2 days later on 07/10/2017.  She underwent successful cardioversion on the increased dose.  She has a maintaining normal rhythm since.  She hadn't atrial fibrillation clinic follow-up on  07/17/2017 and her ECG showed sinus rhythm at 60 bpm.  She has continued to be on sotalol 120 twice a day, metoprolol 25 mg twice a day, and eliquis 5 mg twice a day for cha2ds2score of at least 5.  Of note, upon further questioning, she has had issues recently where food gets stuck in her esophagus.  Remotely she had undergone esophageal dilatations in 2015.   As I last saw her in December 2018, he has continued to feel well.  She has a neuropathy of her feet.  She is unaware of any recurrent episodes of atrial fibrillation.  Recently had undergone laboratory by her primary physician which showed a cholesterol 229, triglycerides 340, VLDL 68, and direct LDL 150.  Currently she has had issues with statins.  She has never tried Investment banker, corporate.  He continues to be on ill Eliquis without bleeding.  She presents for reevaluation.  Past Medical History:  Diagnosis Date  . Abnormal nuclear stress test    mild anterolateral septal and inferior ischemia  . Arthritis   . Atherosclerosis of lower extremity with claudication ([place]) 04/17/2015   LEFT LEG  . Atrial fibrillation ([place])   . Claudication ([place])   . Colon cancer ([place])   . Coronary atherosclerosis of unspecified type of vessel, native or graft   . Esophageal stricture   . Fatty liver 11/05/11  . Hiatal hernia   . HLD (hyperlipidemia)   . Hypertension   . Iron deficiency anemia, unspecified   . Ischemic colitis ([place])   . Neuropathy   . Osteoporosis   . PAF (paroxysmal atrial fibrillation) ([place]) 04/2015  . Unspecified vascular insufficiency of intestine     Past Surgical History:  Procedure Laterality Date  . abdominoperineal resection anastomotic stricture  05/2007  . APPENDECTOMY    . CARDIAC CATHETERIZATION N/A 03/13/2015   Procedure: Left Heart Cath and Coronary Angiography;  Surgeon: Troy Sine, MD;  Location: [place] [facility];  Service: Cardiovascular;  Laterality: N/A;  . CARDIOVERSION N/A 04/20/2015   Procedure: CARDIOVERSION;  Surgeon:  Jerline Pain, MD;  Location: [place];  Service: Cardiovascular;  Laterality: N/A;  . CARDIOVERSION N/A 07/10/2017   Procedure: CARDIOVERSION;  Surgeon: Jerline Pain, MD;  Location: [place];  Service: Cardiovascular;  Laterality: N/A;  . [place]  . ESOPHAGOGASTRODUODENOSCOPY N/A 04/27/2014   Procedure: ESOPHAGOGASTRODUODENOSCOPY (EGD);  Surgeon: Lafayette Dragon, MD;  Location: [facility];  Service: Endoscopy;  Laterality: N/A;  . Mandibular Renstruction    . ROTATOR CUFF REPAIR  2006   left  . Rt. Salpingo oophorectomy and cyst removal  2005  . SAVORY DILATION N/A 04/27/2014   Procedure: SAVORY DILATION;  Surgeon: Lafayette Dragon, MD;  Location: [facility];  Service: Endoscopy;  Laterality: N/A;  no xray needed  . sigmoid resection for invasive rectal adenocarcinoma  12/2006  . TEE WITHOUT CARDIOVERSION N/A 04/20/2015   Procedure: TRANSESOPHAGEAL ECHOCARDIOGRAM (TEE);  Surgeon: Jerline Pain, MD;  Location: [place];  Service: Cardiovascular;  Laterality: N/A;  . TUBAL LIGATION    . WRIST SURGERY  2008   right  Allergies  Allergen Reactions  . Crestor [Rosuvastatin Calcium] Other (See Comments)    Muscle aches  . Acetaminophen-Codeine   . Amlodipine Besylate     REACTION: swelling  . Diltiazem Hcl     REACTION: rash  . Hydrocodone-Acetaminophen     nausea  . Irbesartan-Hydrochlorothiazide     REACTION: Dizziness  . Lisinopril     cough  . Omeprazole Nausea Only and Other (See Comments)    Dizziness, joint pain, weakness in legs, cramps in legs, stomach burned  . Propoxyphene N-Acetaminophen     Headache, and blotchy face  . Valsartan     REACTION: blurred vision  . Warfarin Sodium     REACTION: body aches and bleeding  . Verapamil Nausea Only, Palpitations and Other (See Comments)    Headaches     Current Outpatient Medications  Medication Sig Dispense Refill  . apixaban (ELIQUIS) 5 MG TABS tablet Take 1 tablet (5 mg total)  by mouth 2 (two) times daily. 180 tablet 3  . Calcium Carbonate-Vitamin D (CALCIUM 600 + D PO) Take by mouth daily.     . chlorthalidone (HYGROTON) 25 MG tablet TAKE 1 TABLET BY MOUTH DAILY 90 tablet 0  . Cyanocobalamin 2500 MCG CHEW Chew 1 tablet by mouth daily.    . Dietary Management Product (FOSTEUM PLUS) CAPS Take 1 capsule by mouth 2 (two) times daily. 180 capsule 0  . diphenoxylate-atropine (LOMOTIL) 2.5-0.025 MG tablet Take 1 tablet by mouth 2 (two) times daily as needed for diarrhea or loose stools. 60 tablet 1  . gabapentin (NEURONTIN) 100 MG capsule Take 100 mg by mouth 3 (three) times daily.    Marland Kitchen GARLIC PO Take 1 tablet daily by mouth.    . hydrocortisone cream 1 % Apply 1 application 3 (three) times daily as needed topically for itching (skin irritation). 30 g 0  . metoprolol tartrate (LOPRESSOR) 25 MG tablet Take 1 tablet (25 mg total) by mouth 2 (two) times daily. 180 tablet 3  . Multiple Vitamin (MULTIVITAMIN) tablet Take 1 tablet by mouth daily.    . potassium chloride (K-DUR,KLOR-CON) 10 MEQ tablet Take 2 tablets (20 mEq total) by mouth daily. 180 tablet 2  . ranitidine (ZANTAC) 300 MG tablet Take 1 tablet (300 mg total) by mouth every morning. 90 tablet 3  . sotalol (BETAPACE) 120 MG tablet Take 1 tablet (120 mg total) by mouth every 12 (twelve) hours. 180 tablet 3  . Pitavastatin Calcium (LIVALO) 2 MG TABS Take 1/2 tablet (82m) daily for 4 weeks, then increase to 1 tablet daily 28 tablet 0   No current facility-administered medications for this visit.     Social History   Socioeconomic History  . Marital status: Widowed    Spouse name: Not on file  . Number of children: 4  . Years of education: 12th grade  . Highest education level: Not on file  Occupational History  . Occupation: retired    [employer] farm manager RETIRED  Social Needs  . Financial resource strain: Not on file  . Food insecurity:    Worry: Not on file    Inability: Not on file  . Transportation needs:     Medical: Not on file    Non-medical: Not on file  Tobacco Use  . Smoking status: Never Smoker  . Smokeless tobacco: Never Used  Substance and Sexual Activity  . Alcohol use: No  . Drug use: No  . Sexual activity: Not Currently  Lifestyle  . Physical activity:  Days per week: Not on file    Minutes per session: Not on file  . Stress: Not on file  Relationships  . Social connections:    Talks on phone: Not on file    Gets together: Not on file    Attends religious service: Not on file    Active member of club or organization: Not on file    Attends meetings of clubs or organizations: Not on file    Relationship status: Not on file  . Intimate partner violence:    Fear of current or ex partner: Not on file    Emotionally abused: Not on file    Physically abused: Not on file    Forced sexual activity: Not on file  Other Topics Concern  . Not on file  Social History Narrative   Lives at home with fiance.   Right-handed.   No caffeine use.    Family History  Problem Relation Age of Onset  . Colon cancer Father 7  . Heart disease Father   . Hypertension Father   . Alzheimer's disease Mother   . Thyroid disease Mother   . Hypertension Sister   . Thyroid disease Sister   . Mitral valve prolapse Sister   . Hypertension Son   . Diabetes Son   . Hypertension Son   . Diabetes Son   . Esophageal cancer Neg Hx   . Rectal cancer Neg Hx   . Stomach cancer Neg Hx    Social history  is notable that she is widowed. She has 3 children and 9 grandchildren. She remains active. There is no alcohol tobacco use.   ROS General: Negative; No fevers, chills, or night sweats; positive for fatigue and shortness of breath HEENT: Negative; No changes in vision or hearing, sinus congestion, difficulty swallowing Pulmonary: Negative; No cough, wheezing, hemoptysis Cardiovascular: Positive for recurrent AF, status post cardioversion Positive for left leg claudication GI: Positive for  GERD; dysphagia GU: Negative; No dysuria, hematuria, or difficulty voiding Musculoskeletal: Negative; no myalgias, joint pain, or weakness Hematologic/Oncology: Negative; no easy bruising, bleeding Endocrine: Negative; no heat/cold intolerance; no diabetes Neuro: Positive for peripheral neuropathy Skin: Negative; No rashes or skin lesions Psychiatric: Negative; No behavioral problems, depression Sleep: Negative; No snoring, daytime sleepiness, hypersomnolence, bruxism, restless legs, hypnogognic hallucinations, no cataplexy Other comprehensive 14 point system review is negative.   PE BP 136/82   Pulse (!) 56   Ht _0  (1.651 m)   Wt 200 lb (90.7 kg)   BMI 33.28 kg/m    Repeat blood pressure by me was 140/74  Wt Readings from Last 3 Encounters:  02/08/18 200 lb (90.7 kg)  12/28/17 200 lb 8 oz (90.9 kg)  12/21/17 202 lb 8 oz (91.9 kg)    General: Alert, oriented, no distress.  Skin: normal turgor, no rashes, warm and dry HEENT: Normocephalic, atraumatic. Pupils equal Turner and reactive to light; sclera anicteric; extraocular muscles intact;  Nose without nasal septal hypertrophy Mouth/Parynx benign; Mallinpatti scale Neck: No JVD, no carotid bruits; normal carotid upstroke Lungs: clear to ausculatation and percussion; no wheezing or rales Chest wall: without tenderness to palpitation Heart: PMI not displaced, RRR, s1 s2 normal, 1/6 systolic murmur, no diastolic murmur, no rubs, gallops, thrills, or heaves Abdomen: soft, nontender; no hepatosplenomehaly, BS+; abdominal aorta nontender and not dilated by palpation. Back: no CVA tenderness Pulses 2+ Musculoskeletal: full range of motion, normal strength, no joint deformities Extremities: no clubbing cyanosis or edema, Homan's sign negative  Neurologic: grossly nonfocal; Cranial nerves grossly wnl Psychologic: Normal mood and affect   ECG (independently read by me): Sinus bradycardia 56 bpm.  QTc interval 474 ms.  December  2018 ECG (independently read by me): Normal sinus rhythm at 60 bpm.  QTc interval 454 ms.  No ST segment changes.  October 2018 ECG (independently read by me): Normal sinus rhythm at 62 bpm.  No significant ST-T changes.  No ectopy.  QTc interval 452 ms.  May 2018 ECG (independently read by me): Sinus bradycardia 59 bpm.  Low voltage.  Normal intervals.  No ST segment changes.  October 2017 ECG (independently read by me): Sinus bradycardia 57 bpm.  No ectopy.  Normal intervals.  November 2016 ECG (independently read by me): Sinus bradycardia 59 bpm.  No ectopy.  Normal intervals.  QTC 457 ms.  05/22/2015 ECG (independently read by me): Normal sinus rhythm at 60 bpm.  QTc interval 454 ms.  No significant ST segment changes.  June 2016 ECG (independently read by me): Normal sinus rhythm at 63 bpm.  Poor precordial R-wave progression.  No ectopy.  November 2015 ECG (independently read by me);  Normal sinus rhythm at 59 bpm.  QTc interval 415 ms.  No significant ST segment changes.  Prior November 2014ECG: Sinus rhythm at 63 beats per minute. Normal intervals.  LABS: BMP Latest Ref Rng & Units 12/28/2017 07/21/2017 07/17/2017  Glucose 70 - 99 mg/dL 93 92 99  BUN 6 - 23 mg/dL _0 Creatinine 0.40 - 1.20 mg/dL 0.81 0.89 0.81  Sodium 135 - 145 mEq/L 141 140 138  Potassium 3.5 - 5.1 mEq/L 3.5 3.8 3.6  Chloride 96 - 112 mEq/L 102 103 103  CO2 19 - 32 mEq/L 32 30 29  Calcium 8.4 - 10.5 mg/dL 9.9 10.1 9.8   Hepatic Function Latest Ref Rng & Units 12/28/2017 04/09/2017 12/18/2016  Total Protein 6.0 - 8.3 g/dL 7.2 7.2 7.1  Albumin 3.5 - 5.2 g/dL 4.2 3.9 -  AST 0 - 37 U/L 19 26 -  ALT 0 - 35 U/L 14 21 -  Alk Phosphatase 39 - 117 U/L 48 57 -  Total Bilirubin 0.2 - 1.2 mg/dL 1.1 1.21(H) -  Bilirubin, Direct 0.0 - 0.3 mg/dL - - -   CBC Latest Ref Rng & Units 12/28/2017 07/07/2017 07/07/2017  WBC 4.0 - 10.5 K/uL 5.0 6.5 8.1  Hemoglobin 12.0 - 15.0 g/dL 15.7(H) 14.7 15.9(H)  Hematocrit 36.0 - 46.0  % 44.8 42.4 45.8  Platelets 150.0 - 400.0 K/uL 189.0 179 201   Lab Results  Component Value Date   MCV 91.5 12/28/2017   MCV 91.4 07/07/2017   MCV 90.7 07/07/2017   Lab Results  Component Value Date   TSH 1.62 11/27/2016   Lab Results  Component Value Date   HGBA1C 5.5 12/18/2016   Lipid Panel     Component Value Date/Time   CHOL 229 (H) 12/28/2017 1340   TRIG 340.0 (H) 12/28/2017 1340   HDL 43.10 12/28/2017 1340   CHOLHDL 5 12/28/2017 1340   VLDL 68.0 (H) 12/28/2017 1340   LDLCALC 86 08/28/2016 1009   LDLDIRECT 150.0 12/28/2017 1340    RADIOLOGY: US Breast Right  07/04/2013   CLINICAL DATA:  Abnormal screening right mammogram.  EXAM: DIGITAL DIAGNOSTIC  right MAMMOGRAM  ULTRASOUND right BREAST  COMPARISON:  With prior exams.  ACR Breast Density Category b: There are scattered areas of fibroglandular density.  FINDINGS: Spot compression views of the lateral aspect  of the right breast were performed. There is persistence of a 4 mm low-density nodule in the posterior 3rd of the breast. There are no malignant type microcalcifications.  On physical exam I do not palpate a mass in the right breast.  Ultrasound is performed, showing there is a near anechoic lesion in the right breast at 7 o'clock 8 cm from the nipple measuring 3 x 2 x 3 mm.  IMPRESSION: Probable benign lesion in the right breast.  RECOMMENDATION: Short-term interval followup right mammogram and ultrasound in 6 months is recommended to document stability.  I have discussed the findings and recommendations with the patient. Results were also provided in writing at the conclusion of the visit. If applicable, a reminder letter will be sent to the patient regarding the next appointment.  BI-RADS CATEGORY  3: Probably benign finding(s) - short interval follow-up suggested.   Electronically Signed   By: Lillia Mountain M.D.   On: 07/04/2013 09:01   Mm [place] R  07/04/2013   CLINICAL DATA:  Abnormal screening right  mammogram.  EXAM: DIGITAL DIAGNOSTIC  right MAMMOGRAM  ULTRASOUND right BREAST  COMPARISON:  With prior exams.  ACR Breast Density Category b: There are scattered areas of fibroglandular density.  FINDINGS: Spot compression views of the lateral aspect of the right breast were performed. There is persistence of a 4 mm low-density nodule in the posterior 3rd of the breast. There are no malignant type microcalcifications.  On physical exam I do not palpate a mass in the right breast.  Ultrasound is performed, showing there is a near anechoic lesion in the right breast at 7 o'clock 8 cm from the nipple measuring 3 x 2 x 3 mm.  IMPRESSION: Probable benign lesion in the right breast.  RECOMMENDATION: Short-term interval followup right mammogram and ultrasound in 6 months is recommended to document stability.  I have discussed the findings and recommendations with the patient. Results were also provided in writing at the conclusion of the visit. If applicable, a reminder letter will be sent to the patient regarding the next appointment.  BI-RADS CATEGORY  3: Probably benign finding(s) - short interval follow-up suggested.   Electronically Signed   By: Lillia Mountain M.D.   On: 07/04/2013 09:01   IMPRESSION:  1. Paroxysmal atrial fibrillation (HCC)   2. Essential hypertension   3. Anticoagulation adequate   4. Idiopathic peripheral neuropathy   5. PVD (peripheral vascular disease) ([place])   6. Grade II diastolic dysfunction      ASSESSMENT AND PLAN: [name] is an 84 year old female who has a history of hypertension, paroxysmal atrial fibrillation,  hyperlipidemia and peripheral vascular disease. She  is on eliquis for anticoagulation and tolerating this well.  She developed recurrent AF and underwent successful cardioversion during her emergency room evaluation on 01/15/2017.  Most recently, she developed recurrent A. fib despite taking metoprolol and sotalol 80 mg twice a day.  She had 2 failed attempts in the  emergency room in early November when she presented with AF with RVR.  Ultimately, her sotalol dose was increased to 120 twice a day and she was able to be successfully cardioverted.  She has not had any recurrent atrial fibrillation and ECG today confirms sinus rhythm.  She is on sotalol in addition to metoprolol.  She is anticoagulated and is tolerating Eliquis.  She has multiple allergies to medications.  Her blood pressure today is upper normal to her line increased based on new hypertensive guidelines.  She continues to be on metoprolol 25 mg twice a day and chlorthalidone 25 mg daily.  She continues to be on sotalol 120 mg twice a day.  QTc interval is slightly increased at 474 ms.  I reviewed her laboratory.  With her peripheral vascular disease and previous documentation of an ABI of 0.63 on the left 1.0 on the right with occlusive disease in the proximal to mid left superficial femoral artery mono phasic flow with three-vessel runoff in the popliteal and anterior tibial artery stenosis I have recommended significant attempt at lipid-lowering treatment.  She is never tried Investment banker, corporate.  I will provide her with samples of 1 mg daily for 1 month and if tolerates she will increase this to 2 mg.  I also discussed the possibility of PCSK9 inhibition to reduce LDL low 70.  Repeat lab work will be obtained in 2 months.  I will see her in 6 months for reevaluation or sooner as needed.  Time spent: 25 minutes  Troy Sine, MD, [facility]  02/10/2018 6:58 PM

## 2018-02-08 NOTE — Patient Instructions (Signed)
Medication Instructions:  START Livalo --take 1 mg (1/2 tablet) daily for 4 weeks --then increase to 2 mg (1 tablet) daily  Follow-Up: Your physician wants you to follow-up in: 6 months with Dr. Claiborne Billings.  You will receive a reminder letter in the mail two months in advance. If you don't receive a letter, please call our office to schedule the follow-up appointment.   Any Other Special Instructions Will Be Listed Below (If Applicable).     If you need a refill on your cardiac medications before your next appointment, please call your pharmacy.

## 2018-02-10 ENCOUNTER — Encounter: Payer: Self-pay | Admitting: Cardiovascular Disease

## 2018-02-16 NOTE — Addendum Note (Signed)
Addended by: Madlynn Lundeen W on: 02/16/2018 01:44 PM   Modules accepted: Orders  

## 2018-02-23 ENCOUNTER — Other Ambulatory Visit: Payer: Self-pay | Admitting: Neurology

## 2018-02-23 ENCOUNTER — Other Ambulatory Visit: Payer: Self-pay | Admitting: Internal Medicine

## 2018-02-23 DIAGNOSIS — I10 Essential (primary) hypertension: Secondary | ICD-10-CM

## 2018-02-23 NOTE — Telephone Encounter (Signed)
Copied from [place] [phone]. Topic: Quick Communication - See Telephone Encounter >> Feb 23, 2018 11:16 AM Mylinda Latina, NT wrote: CRM for notification. See Telephone encounter for: 02/23/18. Patient called and states that she needs a refill of her Dietary Management Product (FOSTEUM PLUS) CAPS.  Patient is requesting a 61 day supply   [facility]- [address], [place], [place] - [address] [phone] (Phone) [phone] (Fax)

## 2018-03-13 NOTE — Progress Notes (Signed)
Cardiology Office Note   Date:  03/15/2018   ID:  KEVIN MARIO, DOB Sep 06, 1932, MRN [id number]  PCP:  Janith Lima, MD  Cardiologist:  Dr. Claiborne Billings  Chief Complaint  Patient presents with  . Follow-up    possibly change from eliquis to xarelto due to side effects- achy bones, hair loss, thinning of hair     History of Present Illness: [name] is a 84 y.o. female who presents for ongoing assessment and management of PAF, with history of hypertension, and hyperlipidemia.  The patient did not tolerate simvastatin due to myalgias, and refused further lipid-lowering therapy.  The patient was last seen by Dr. Claiborne Billings on 02/08/2018, he noted that the patient's last echocardiogram was completed in August 2012 which revealed mild asymmetric LVH and proximal septal thickening without LVOT obstruction.  She was found to have grade 1 diastolic dysfunction and normal LV systolic function with mild MR, mild TR, and aortic valve sclerosis with mild MR.  Patient also has a history of PAD, with symptoms of claudication requiring ABIs which were found to be abnormal with left ABI 0.63.  The left common iliac external iliac and common femoral artery and profunda demonstrated multiphasic flow, however,she was found to have a superficial femoral artery on the left revealing occlusive disease in the proximal to mid thigh with reconstruction of flow in the mid distal segment..  The popliteal artery demonstrated patent monophasic flow with three-vessel runoff.  The anterior tibial artery demonstrated local stenosis in the proximal calf and often flow distal to this.  Patient has had episodes of recurrent atrial fibrillation with mild chest pain and has had 2 attempts at cardioversion.  She had been ultimately followed by Dr. Rayann Heman in consultation although her QTC was mildly prolonged he felt she was stable.  Sotalol was increased to 120 mg twice daily.  She was continued on metoprolol 25 mg twice daily and Eliquis  5 mg twice daily CHADS Score of 5.  On last office visit with Dr. Claiborne Billings on 02/08/2018 she continued to feel well, she did not express any episodes of atrial fibrillation.  The patient was given Livalo  1 mg daily and was to increase it to 2 mg daily if she tolerated it.  They did discuss PCSK9 inhibition to reduce LDL.  No further testing was done on PAD.  She comes today and wants to try Xarelto instead of Eliquis to see if her symptoms of her hair falling out and some joint pain. She wants to continue Livalo.   Past Medical History:  Diagnosis Date  . Abnormal nuclear stress test    mild anterolateral septal and inferior ischemia  . Arthritis   . Atherosclerosis of lower extremity with claudication ([place]) 04/17/2015   LEFT LEG  . Atrial fibrillation ([place])   . Claudication ([place])   . Colon cancer ([place])   . Coronary atherosclerosis of unspecified type of vessel, native or graft   . Esophageal stricture   . Fatty liver 11/05/11  . Hiatal hernia   . HLD (hyperlipidemia)   . Hypertension   . Iron deficiency anemia, unspecified   . Ischemic colitis ([place])   . Neuropathy   . Osteoporosis   . PAF (paroxysmal atrial fibrillation) ([place]) 04/2015  . Unspecified vascular insufficiency of intestine     Past Surgical History:  Procedure Laterality Date  . abdominoperineal resection anastomotic stricture  05/2007  . APPENDECTOMY    . CARDIAC CATHETERIZATION N/A 03/13/2015   Procedure: Left  Heart Cath and Coronary Angiography;  Surgeon: Troy Sine, MD;  Location: [facility];  Service: Cardiovascular;  Laterality: N/A;  . CARDIOVERSION N/A 04/20/2015   Procedure: CARDIOVERSION;  Surgeon: Jerline Pain, MD;  Location: [facility];  Service: Cardiovascular;  Laterality: N/A;  . CARDIOVERSION N/A 07/10/2017   Procedure: CARDIOVERSION;  Surgeon: Jerline Pain, MD;  Location: [facility];  Service: Cardiovascular;  Laterality: N/A;  . [place]  .  ESOPHAGOGASTRODUODENOSCOPY N/A 04/27/2014   Procedure: ESOPHAGOGASTRODUODENOSCOPY (EGD);  Surgeon: Lafayette Dragon, MD;  Location: [facility];  Service: Endoscopy;  Laterality: N/A;  . Mandibular Renstruction    . ROTATOR CUFF REPAIR  2006   left  . Rt. Salpingo oophorectomy and cyst removal  2005  . SAVORY DILATION N/A 04/27/2014   Procedure: SAVORY DILATION;  Surgeon: Lafayette Dragon, MD;  Location: [facility];  Service: Endoscopy;  Laterality: N/A;  no xray needed  . sigmoid resection for invasive rectal adenocarcinoma  12/2006  . TEE WITHOUT CARDIOVERSION N/A 04/20/2015   Procedure: TRANSESOPHAGEAL ECHOCARDIOGRAM (TEE);  Surgeon: Jerline Pain, MD;  Location: [facility];  Service: Cardiovascular;  Laterality: N/A;  . TUBAL LIGATION    . WRIST SURGERY  2008   right     Current Outpatient Medications  Medication Sig Dispense Refill  . Calcium Carbonate-Vitamin D (CALCIUM 600 + D PO) Take by mouth daily.     . chlorthalidone (HYGROTON) 25 MG tablet TAKE 1 TABLET BY MOUTH DAILY 90 tablet 0  . Cyanocobalamin 2500 MCG CHEW Chew 1 tablet by mouth daily.    . Dietary Management Product (FOSTEUM PLUS) CAPS TAKE ONE CAPSULE BY MOUTH TWICE DAILY 180 capsule 1  . diphenoxylate-atropine (LOMOTIL) 2.5-0.025 MG tablet Take 1 tablet by mouth 2 (two) times daily as needed for diarrhea or loose stools. 60 tablet 1  . gabapentin (NEURONTIN) 100 MG capsule Take 100 mg by mouth 3 (three) times daily.    . hydrocortisone cream 1 % Apply 1 application 3 (three) times daily as needed topically for itching (skin irritation). 30 g 0  . metoprolol tartrate (LOPRESSOR) 25 MG tablet Take 1 tablet (25 mg total) by mouth 2 (two) times daily. 180 tablet 3  . Multiple Vitamin (MULTIVITAMIN) tablet Take 1 tablet by mouth daily.    . Pitavastatin Calcium (LIVALO) 2 MG TABS Take 1 tablet (2 mg total) by mouth daily. 28 tablet 0  . potassium chloride (K-DUR,KLOR-CON) 10 MEQ tablet Take 2 tablets (20 mEq total) by mouth  daily. 180 tablet 2  . ranitidine (ZANTAC) 300 MG tablet Take 1 tablet (300 mg total) by mouth every morning. 90 tablet 3  . sotalol (BETAPACE) 120 MG tablet Take 1 tablet (120 mg total) by mouth every 12 (twelve) hours. 180 tablet 3  . GARLIC PO Take 1 tablet daily by mouth.    . rivaroxaban (XARELTO) 20 MG TABS tablet Take 1 tablet (20 mg total) by mouth daily with supper. 30 tablet 5   No current facility-administered medications for this visit.     Allergies:   Crestor [rosuvastatin calcium]; Acetaminophen-codeine; Amlodipine besylate; Diltiazem hcl; Hydrocodone-acetaminophen; Irbesartan-hydrochlorothiazide; Lisinopril; Omeprazole; Propoxyphene n-acetaminophen; Valsartan; Warfarin sodium; and Verapamil    Social History:  The patient  reports that she has never smoked. She has never used smokeless tobacco. She reports that she does not drink alcohol or use drugs.   Family History:  The patient's family history includes Alzheimer's disease in her mother;  Colon cancer (age of onset: 54) in her father; Diabetes in her son and son; Heart disease in her father; Hypertension in her father, sister, son, and son; Mitral valve prolapse in her sister; Thyroid disease in her mother and sister.    ROS: All other systems are reviewed and negative. Unless otherwise mentioned in H&P    PHYSICAL EXAM: VS:  BP 132/80 (BP Location: Left Arm, Patient Position: Sitting)   Pulse 61   Ht 5\' 6"  (1.676 m)   Wt 203 lb (92.1 kg)   SpO2 97%   BMI 32.77 kg/m  , BMI Body mass index is 32.77 kg/m. GEN: Well nourished, well developed, in no acute distress  HEENT: normal  Neck: no JVD, carotid bruits, or masses Cardiac: RRR; no murmurs, rubs, or gallops,no edema  Respiratory:  clear to auscultation bilaterally, normal work of breathing GI: soft, nontender, nondistended, + BS MS: no deformity or atrophy  Skin: warm and dry, no rash Neuro:  Strength and sensation are intact Psych: euthymic mood, full  affect   EKG: Not completed on this office visit  Recent Labs: 07/17/2017: Magnesium 1.8 12/28/2017: ALT 14; BUN 20; Creatinine, Ser 0.81; Hemoglobin 15.7; Platelets 189.0; Potassium 3.5; Sodium 141    Lipid Panel    Component Value Date/Time   CHOL 229 (H) 12/28/2017 1340   TRIG 340.0 (H) 12/28/2017 1340   HDL 43.10 12/28/2017 1340   CHOLHDL 5 12/28/2017 1340   VLDL 68.0 (H) 12/28/2017 1340   LDLCALC 86 08/28/2016 1009   LDLDIRECT 150.0 12/28/2017 1340      Wt Readings from Last 3 Encounters:  03/15/18 203 lb (92.1 kg)  02/08/18 200 lb (90.7 kg)  12/28/17 200 lb 8 oz (90.9 kg)      Other studies Reviewed:  Echocardiogram 02/06/2017 Left ventricle: The cavity size was normal. Wall thickness was   increased in a pattern of moderate LVH. Systolic function was   normal. The estimated ejection fraction was in the range of 60%   to 65%. Wall motion was normal; there were no regional wall   motion abnormalities. Features are consistent with a pseudonormal   left ventricular filling pattern, with concomitant abnormal   relaxation and increased filling pressure (grade 2 diastolic   dysfunction).  ASSESSMENT AND PLAN:  1. PAF: She offers no complaints of irregular heart rate, palpitations or racing HR. She would like to switch to Xarelto from Eliquis. I have given her samples of Xarelto 20 mg daily,  and asked that she begin this in the am. She is to report any issues. I will repeat her CBC in two weeks. She remains on sotalol and metoprolol.   2. Hypercholesterolemia: She is on Livalo 1 mg daily. I will recheck her Lipids and LFTs in 2 weeks as this will be 6 weeks since beginning this medication. If she is responding well, may consider increasing to 2 mg daily. Still can be a candidate for PCSK9 inhibitor if necessary.   3.  PAD:  She has not had any further intermittent claudication symptoms but with known PAD per ABI and ultrasound this will need to be followed closely.  Further testing was not pursued as she was hospitalized for afib. Will keep check on symptoms. I have discussed this with her and she verbalizes understanding.    Current medicines are reviewed at length with the patient today.    Labs/ tests ordered today include: CBC and L/L   Phill Myron. West Pugh, ANP, AACC  03/15/2018 4:54 PM    [facility] [address] [place], [place] [address] Phone: [phone]; Fax: [phone]

## 2018-03-15 ENCOUNTER — Encounter: Payer: Self-pay | Admitting: Adult Health

## 2018-03-15 ENCOUNTER — Ambulatory Visit: Payer: Medicare HMO | Admitting: Adult Health

## 2018-03-15 VITALS — BP 132/80 | HR 61 | Ht 66.0 in | Wt 203.0 lb

## 2018-03-15 DIAGNOSIS — Z79899 Other long term (current) drug therapy: Secondary | ICD-10-CM

## 2018-03-15 DIAGNOSIS — I480 Paroxysmal atrial fibrillation: Secondary | ICD-10-CM

## 2018-03-15 DIAGNOSIS — E7800 Pure hypercholesterolemia, unspecified: Secondary | ICD-10-CM

## 2018-03-15 DIAGNOSIS — I739 Peripheral vascular disease, unspecified: Secondary | ICD-10-CM

## 2018-03-15 MED ORDER — PITAVASTATIN CALCIUM 2 MG PO TABS: 2.0000 mg | ORAL_TABLET | Freq: Every day | ORAL | 0 refills | Status: DC

## 2018-03-15 MED ORDER — RIVAROXABAN 20 MG PO TABS: 20.0000 mg | ORAL_TABLET | Freq: Every day | ORAL | 5 refills | Status: DC

## 2018-03-15 NOTE — Patient Instructions (Signed)
Medication Instructions:  STOP ELIQUIS  START XARELTO 20MG  DAILY  START LIVALO 2MG  DAILY  If you need a refill on your cardiac medications before your next appointment, please call your pharmacy.  Labwork: FLP,LFT,BMET AND CBC IN 2 WEEKS (7-29)  HERE IN OUR OFFICE AT [facility]  Take the provided lab slips with you to the lab for your blood draw.   You will need to fast. DO NOT EAT OR DRINK PAST MIDNIGHT.   Follow-Up: Your physician wants you to follow-up in: [place], [place] December. You should receive a reminder letter in the mail two months in advance. If you do not receive a letter, please call our office OCTOBER to schedule the DECEMBER follow-up appointment.   Thank you for choosing [facility] at [facility]!!

## 2018-03-16 ENCOUNTER — Ambulatory Visit: Payer: Medicare HMO | Admitting: Gastroenterology

## 2018-03-16 ENCOUNTER — Encounter: Payer: Self-pay | Admitting: Gastroenterology

## 2018-03-16 VITALS — BP 116/72 | HR 84 | Ht 66.0 in | Wt 203.0 lb

## 2018-03-16 DIAGNOSIS — K588 Other irritable bowel syndrome: Secondary | ICD-10-CM

## 2018-03-16 DIAGNOSIS — K219 Gastro-esophageal reflux disease without esophagitis: Secondary | ICD-10-CM

## 2018-03-16 NOTE — Progress Notes (Signed)
[name]    [id number]    07-22-1933  Primary Care Physician:Jones, Arvid Right, MD  Referring Physician: Janith Lima, MD [address], [place] [address]  Chief complaint:  IBS-D and GERD  HPI:  84 year old female with history of chronic GERD, hernia, esophageal stricture status post dilation, sigmoid cancer status post sigmoid resection and anastomotic stricture. She was seen in office December 21, 2017. Bowel habits have improved and is not having episodes of diarrhea.  IBgard is helping with abdominal cramps.  She has intermittent episodes of heartburn and also intermittent sensation of food getting hung up , swallowing improves when she drinks water.  Never had any episodes of food impaction or regurgitation.  Denies any nausea, vomiting, abdominal pain, melena or bright red blood per rectum   Colonoscopy October 2012 showed minimal narrowing at sigmoid anastomosis at 20 cm.  No recall due to age. EGD July 19, 2014 distal esophageal stricture dilated from 14 to 18 mm prior to that EGD April 2014    Outpatient Encounter Medications as of 03/16/2018  Medication Sig  . Calcium Carbonate-Vitamin D (CALCIUM 600 + D PO) Take by mouth daily.   . chlorthalidone (HYGROTON) 25 MG tablet TAKE 1 TABLET BY MOUTH DAILY  . Cyanocobalamin 2500 MCG CHEW Chew 1 tablet by mouth daily.  . Dietary Management Product (FOSTEUM PLUS) CAPS TAKE ONE CAPSULE BY MOUTH TWICE DAILY  . diphenoxylate-atropine (LOMOTIL) 2.5-0.025 MG tablet Take 1 tablet by mouth 2 (two) times daily as needed for diarrhea or loose stools.  . gabapentin (NEURONTIN) 100 MG capsule Take 100 mg by mouth 3 (three) times daily.  Marland Kitchen GARLIC PO Take 1 tablet daily by mouth.  . hydrocortisone cream 1 % Apply 1 application 3 (three) times daily as needed topically for itching (skin irritation).  . metoprolol tartrate (LOPRESSOR) 25 MG tablet Take 1 tablet (25 mg total) by mouth 2 (two) times  daily.  . Multiple Vitamin (MULTIVITAMIN) tablet Take 1 tablet by mouth daily.  . Pitavastatin Calcium (LIVALO) 2 MG TABS Take 1 tablet (2 mg total) by mouth daily.  . potassium chloride (K-DUR,KLOR-CON) 10 MEQ tablet Take 2 tablets (20 mEq total) by mouth daily.  . ranitidine (ZANTAC) 300 MG tablet Take 1 tablet (300 mg total) by mouth every morning.  . rivaroxaban (XARELTO) 20 MG TABS tablet Take 1 tablet (20 mg total) by mouth daily with supper.  . sotalol (BETAPACE) 120 MG tablet Take 1 tablet (120 mg total) by mouth every 12 (twelve) hours.   No facility-administered encounter medications on file as of 03/16/2018.     Allergies as of 03/16/2018 - Review Complete 03/15/2018  Allergen Reaction Noted  . Crestor [rosuvastatin calcium] Other (See Comments) 12/25/2016  . Acetaminophen-codeine    . Amlodipine besylate  11/09/2008  . Diltiazem hcl  11/09/2008  . Hydrocodone-acetaminophen    . Irbesartan-hydrochlorothiazide  11/09/2008  . Lisinopril    . Omeprazole Nausea Only and Other (See Comments) 11/15/2014  . Propoxyphene n-acetaminophen    . Valsartan  11/09/2008  . Warfarin sodium  11/09/2008  . Verapamil Nausea Only, Palpitations, and Other (See Comments) 06/02/2011    Past Medical History:  Diagnosis Date  . Abnormal nuclear stress test    mild anterolateral septal and inferior ischemia  . Arthritis   . Atherosclerosis of lower extremity with claudication ([place]) 04/17/2015   LEFT LEG  . Atrial fibrillation ([place])   .  Claudication ([place])   . Colon cancer ([place])   . Coronary atherosclerosis of unspecified type of vessel, native or graft   . Esophageal stricture   . Fatty liver 11/05/11  . Hiatal hernia   . HLD (hyperlipidemia)   . Hypertension   . Iron deficiency anemia, unspecified   . Ischemic colitis ([place])   . Neuropathy   . Osteoporosis   . PAF (paroxysmal atrial fibrillation) ([place]) 04/2015  . Unspecified vascular insufficiency of intestine     Past Surgical  History:  Procedure Laterality Date  . abdominoperineal resection anastomotic stricture  05/2007  . APPENDECTOMY    . CARDIAC CATHETERIZATION N/A 03/13/2015   Procedure: Left Heart Cath and Coronary Angiography;  Surgeon: Troy Sine, MD;  Location: [facility];  Service: Cardiovascular;  Laterality: N/A;  . CARDIOVERSION N/A 04/20/2015   Procedure: CARDIOVERSION;  Surgeon: Jerline Pain, MD;  Location: [facility];  Service: Cardiovascular;  Laterality: N/A;  . CARDIOVERSION N/A 07/10/2017   Procedure: CARDIOVERSION;  Surgeon: Jerline Pain, MD;  Location: [facility];  Service: Cardiovascular;  Laterality: N/A;  . [place]  . ESOPHAGOGASTRODUODENOSCOPY N/A 04/27/2014   Procedure: ESOPHAGOGASTRODUODENOSCOPY (EGD);  Surgeon: Lafayette Dragon, MD;  Location: [facility];  Service: Endoscopy;  Laterality: N/A;  . Mandibular Renstruction    . ROTATOR CUFF REPAIR  2006   left  . Rt. Salpingo oophorectomy and cyst removal  2005  . SAVORY DILATION N/A 04/27/2014   Procedure: SAVORY DILATION;  Surgeon: Lafayette Dragon, MD;  Location: [facility];  Service: Endoscopy;  Laterality: N/A;  no xray needed  . sigmoid resection for invasive rectal adenocarcinoma  12/2006  . TEE WITHOUT CARDIOVERSION N/A 04/20/2015   Procedure: TRANSESOPHAGEAL ECHOCARDIOGRAM (TEE);  Surgeon: Jerline Pain, MD;  Location: [facility];  Service: Cardiovascular;  Laterality: N/A;  . TUBAL LIGATION    . WRIST SURGERY  2008   right    Family History  Problem Relation Age of Onset  . Colon cancer Father 4  . Heart disease Father   . Hypertension Father   . Alzheimer's disease Mother   . Thyroid disease Mother   . Hypertension Sister   . Thyroid disease Sister   . Mitral valve prolapse Sister   . Hypertension Son   . Diabetes Son   . Hypertension Son   . Diabetes Son   . Esophageal cancer Neg Hx   . Rectal cancer Neg Hx   . Stomach cancer Neg Hx     Social History    Socioeconomic History  . Marital status: Widowed    Spouse name: Not on file  . Number of children: 4  . Years of education: 12th grade  . Highest education level: Not on file  Occupational History  . Occupation: retired    Fish farm manager: RETIRED  Social Needs  . Financial resource strain: Not on file  . Food insecurity:    Worry: Not on file    Inability: Not on file  . Transportation needs:    Medical: Not on file    Non-medical: Not on file  Tobacco Use  . Smoking status: Never Smoker  . Smokeless tobacco: Never Used  Substance and Sexual Activity  . Alcohol use: No  . Drug use: No  . Sexual activity: Not Currently  Lifestyle  . Physical activity:    Days per week: Not on file    Minutes per session: Not on file  .  Stress: Not on file  Relationships  . Social connections:    Talks on phone: Not on file    Gets together: Not on file    Attends religious service: Not on file    Active member of club or organization: Not on file    Attends meetings of clubs or organizations: Not on file    Relationship status: Not on file  . Intimate partner violence:    Fear of current or ex partner: Not on file    Emotionally abused: Not on file    Physically abused: Not on file    Forced sexual activity: Not on file  Other Topics Concern  . Not on file  Social History Narrative   Lives at home with fiance.   Right-handed.   No caffeine use.      Review of systems: Review of Systems  Constitutional: Negative for fever and chills.  HENT: Negative.   Eyes: Negative for blurred vision.  Respiratory: Negative for cough, shortness of breath and wheezing.   Cardiovascular: Negative for chest pain and palpitations.  Gastrointestinal: as per HPI Genitourinary: Negative for dysuria, urgency, frequency and hematuria.  Musculoskeletal: Positive for myalgias, back pain and joint pain.  Skin: Negative for itching and rash.  Neurological: Negative for dizziness, tremors, focal  weakness, seizures and loss of consciousness.  Endo/Heme/Allergies: Negative for seasonal allergies.  Psychiatric/Behavioral: Negative for depression, suicidal ideas and hallucinations.  All other systems reviewed and are negative.   Physical Exam: Vitals:   03/16/18 0953  BP: 116/72  Pulse: 84   Body mass index is 32.77 kg/m. Gen:      No acute distress HEENT:  EOMI, sclera anicteric Neck:     No masses; no thyromegaly Lungs:    Clear to auscultation bilaterally; normal respiratory effort CV:         Regular rate and rhythm; no murmurs Abd:      + bowel sounds; soft, non-tender; no palpable masses, no distension Ext:    No edema; adequate peripheral perfusion Skin:      Warm and dry; no rash Neuro: alert and oriented x 3 Psych: normal mood and affect  Data Reviewed:  Reviewed labs, radiology imaging, old records and pertinent past GI work up   Assessment and Plan/Recommendations:  84 year old female with history of chronic GERD, esophageal stricture status post dilation to 18 mm, sigmoid colon cancer in 2008 status post resection with anastomotic stricture here for follow-up visit. Overall symptoms stable Mild intermittent dysphagia: Advised patient to cut up food small and chew well Avoid meat and raw vegetables Patient has episodes about once or twice a month, never had any food impaction or regurgitation Given her age, plan to continue to monitor and conservative management If continues to have progressive dysphagia, will consider barium esophagram and based on findings plan for EGD for further evaluation Continue Zantac as needed Discussed antireflux measures Use FD Gard 1 capsule up to 3 times daily for dyspepsia symptoms  Irritable bowel syndrome predominant diarrhea Improved with lactose-free diet and dietary changes Continue IBgard 1 capsule up to 3 times daily as needed  Return as needed   25 minutes was spent face-to-face with the patient. Greater than 50%  of the time used for counseling as well as treatment plan and follow-up. She had multiple questions which were answered to her satisfaction  K. Denzil Magnuson , MD [phone]    CC: Janith Lima, MD

## 2018-03-16 NOTE — Patient Instructions (Signed)
Continue Zantac   Use IBGard as needed  Use FDGard as needed  Follow up as needed   If you are age 82 or older, your body mass index should be between 23-30. Your Body mass index is 32.77 kg/m. If this is out of the aforementioned range listed, please consider follow up with your Primary Care Provider.  If you are age 95 or younger, your body mass index should be between 19-25. Your Body mass index is 32.77 kg/m. If this is out of the aformentioned range listed, please consider follow up with your Primary Care Provider.

## 2018-03-18 ENCOUNTER — Encounter: Payer: Self-pay | Admitting: Gastroenterology

## 2018-03-25 ENCOUNTER — Other Ambulatory Visit: Payer: Self-pay | Admitting: Internal Medicine

## 2018-03-25 DIAGNOSIS — I10 Essential (primary) hypertension: Secondary | ICD-10-CM

## 2018-03-25 NOTE — Telephone Encounter (Signed)
Copied from [place] [id number]. Topic: Quick Communication - See Telephone Encounter >> Mar 25, 2018  8:13 AM Marja Kays F wrote: Pt is needing a refill on gabapentin and chlorthalidone  [facility] mail order   Best number [phone]

## 2018-03-26 NOTE — Telephone Encounter (Signed)
Patient is requesting refills: gabapentin 100 mg  Historical medication                                             Chlorthalidone 25 mg     Last filled: 02/23/18 #90  LOV: 12/28/17 (f/u due 3 months)  PCP: [place]:  [facility] mail order

## 2018-03-29 NOTE — Telephone Encounter (Signed)
Pt is due for follow up. Can you have her schedule for PCP next available.

## 2018-03-29 NOTE — Telephone Encounter (Signed)
Appt made. Please send to [facility] order pharmacy.

## 2018-03-30 DIAGNOSIS — Z79899 Other long term (current) drug therapy: Secondary | ICD-10-CM | POA: Diagnosis not present

## 2018-03-30 MED ORDER — GABAPENTIN 100 MG PO CAPS: 100.0000 mg | ORAL_CAPSULE | Freq: Three times a day (TID) | ORAL | 0 refills | Status: DC

## 2018-03-30 MED ORDER — CHLORTHALIDONE 25 MG PO TABS: 25.0000 mg | ORAL_TABLET | Freq: Every day | ORAL | 0 refills | Status: DC

## 2018-03-31 LAB — BASIC METABOLIC PANEL
BUN/Creatinine Ratio: 18 (ref 12–28)
BUN: 16 mg/dL (ref 8–27)
CALCIUM: 10 mg/dL (ref 8.7–10.3)
CO2: 27 mmol/L (ref 20–29)
Chloride: 102 mmol/L (ref 96–106)
Creatinine, Ser: 0.87 mg/dL (ref 0.57–1.00)
GFR calc Af Amer: 71 mL/min/{1.73_m2} (ref >59)
GFR, EST NON AFRICAN AMERICAN: 62 mL/min/{1.73_m2} (ref >59)
Glucose: 92 mg/dL (ref 65–99)
POTASSIUM: 4 mmol/L (ref 3.5–5.2)
Sodium: 145 mmol/L — ABNORMAL HIGH (ref 134–144)

## 2018-03-31 LAB — LIPID PANEL
CHOLESTEROL TOTAL: 192 mg/dL (ref 100–199)
Chol/HDL Ratio: 4.1 ratio (ref 0.0–4.4)
HDL: 47 mg/dL (ref >39)
LDL CALC: 92 mg/dL (ref 0–99)
Triglycerides: 265 mg/dL — ABNORMAL HIGH (ref 0–149)
VLDL CHOLESTEROL CAL: 53 mg/dL — AB (ref 5–40)

## 2018-03-31 LAB — HEPATIC FUNCTION PANEL
ALT: 18 IU/L (ref 0–32)
AST: 21 IU/L (ref 0–40)
Albumin: 4.3 g/dL (ref 3.5–4.7)
Alkaline Phosphatase: 50 IU/L (ref 39–117)
Bilirubin Total: 1.1 mg/dL (ref 0.0–1.2)
Bilirubin, Direct: 0.23 mg/dL (ref 0.00–0.40)
Total Protein: 6.9 g/dL (ref 6.0–8.5)

## 2018-03-31 LAB — CBC
HEMATOCRIT: 47.1 % — AB (ref 34.0–46.6)
HEMOGLOBIN: 15.1 g/dL (ref 11.1–15.9)
MCH: 30.7 pg (ref 26.6–33.0)
MCHC: 32.1 g/dL (ref 31.5–35.7)
MCV: 96 fL (ref 79–97)
Platelets: 179 10*3/uL (ref 150–450)
RBC: 4.92 x10E6/uL (ref 3.77–5.28)
RDW: 13.7 % (ref 12.3–15.4)
WBC: 5 10*3/uL (ref 3.4–10.8)

## 2018-04-13 ENCOUNTER — Telehealth: Payer: Self-pay | Admitting: Adult Health

## 2018-04-13 MED ORDER — PITAVASTATIN CALCIUM 2 MG PO TABS: 2.0000 mg | ORAL_TABLET | Freq: Every day | ORAL | 0 refills | Status: DC

## 2018-04-13 NOTE — Telephone Encounter (Signed)
New message   *STAT* If patient is at the pharmacy, call can be transferred to refill team.   1. Which medications need to be refilled? (please list name of each medication and dose if known) Pitavastatin Calcium (LIVALO) 2 MG TABS  2. Which pharmacy/location (including street and city if local pharmacy) is medication to be sent to?[facility] [place].  3. Do they need a 30 day or 90 day supply? [place]

## 2018-04-19 ENCOUNTER — Encounter: Payer: Self-pay | Admitting: Internal Medicine

## 2018-04-19 ENCOUNTER — Ambulatory Visit (INDEPENDENT_AMBULATORY_CARE_PROVIDER_SITE_OTHER): Payer: Medicare HMO | Admitting: Internal Medicine

## 2018-04-19 VITALS — BP 140/70 | HR 64 | Temp 98.4°F | Resp 16 | Ht 66.0 in | Wt 202.0 lb

## 2018-04-19 DIAGNOSIS — I480 Paroxysmal atrial fibrillation: Secondary | ICD-10-CM

## 2018-04-19 DIAGNOSIS — L659 Nonscarring hair loss, unspecified: Secondary | ICD-10-CM | POA: Diagnosis not present

## 2018-04-19 DIAGNOSIS — I10 Essential (primary) hypertension: Secondary | ICD-10-CM | POA: Diagnosis not present

## 2018-04-19 MED ORDER — FINASTERIDE 1 MG PO TABS: 1.0000 mg | ORAL_TABLET | Freq: Every day | ORAL | 1 refills | Status: DC

## 2018-04-19 NOTE — Patient Instructions (Signed)

## 2018-04-19 NOTE — Progress Notes (Signed)
Subjective:  Patient ID: [name], female    DOB: 03-Apr-1933  Age: 84 y.o. MRN: [id number]  CC: Hypertension   HPI [name] presents for f/up - She complains of worsening hair loss over the last year - receding hair on the front and thinning on the crown.  She is not willing to do Rogaine scalp spray.  She otherwise feels well and offers no complaints.  She denies any recent episodes of palpitations, CP, DOE, edema, or fatigue.  Outpatient Medications Prior to Visit  Medication Sig Dispense Refill  . Calcium Carbonate-Vitamin D (CALCIUM 600 + D PO) Take by mouth daily.     . chlorthalidone (HYGROTON) 25 MG tablet Take 1 tablet (25 mg total) by mouth daily. 90 tablet 0  . Cyanocobalamin 2500 MCG CHEW Chew 1 tablet by mouth daily.    . Dietary Management Product (FOSTEUM PLUS) CAPS TAKE ONE CAPSULE BY MOUTH TWICE DAILY 180 capsule 1  . gabapentin (NEURONTIN) 100 MG capsule Take 1 capsule (100 mg total) by mouth 3 (three) times daily. 270 capsule 0  . metoprolol tartrate (LOPRESSOR) 25 MG tablet Take 1 tablet (25 mg total) by mouth 2 (two) times daily. 180 tablet 3  . Pitavastatin Calcium (LIVALO) 2 MG TABS Take 1 tablet (2 mg total) by mouth daily. 90 tablet 0  . potassium chloride (K-DUR,KLOR-CON) 10 MEQ tablet Take 2 tablets (20 mEq total) by mouth daily. 180 tablet 2  . ranitidine (ZANTAC) 300 MG tablet Take 1 tablet (300 mg total) by mouth every morning. 90 tablet 3  . rivaroxaban (XARELTO) 20 MG TABS tablet Take 1 tablet (20 mg total) by mouth daily with supper. 30 tablet 5  . sotalol (BETAPACE) 120 MG tablet Take 1 tablet (120 mg total) by mouth every 12 (twelve) hours. 180 tablet 3  . diphenoxylate-atropine (LOMOTIL) 2.5-0.025 MG tablet Take 1 tablet by mouth 2 (two) times daily as needed for diarrhea or loose stools. 60 tablet 1  . hydrocortisone cream 1 % Apply 1 application 3 (three) times daily as needed topically for itching (skin irritation). 30 g 0  . Multiple  Vitamin (MULTIVITAMIN) tablet Take 1 tablet by mouth daily.    Marland Kitchen GARLIC PO Take 1 tablet daily by mouth.     No facility-administered medications prior to visit.     ROS Review of Systems  Constitutional: Negative.  Negative for appetite change, diaphoresis, fatigue and unexpected weight change.  HENT: Negative.   Eyes: Negative for visual disturbance.  Respiratory: Negative for cough, chest tightness, shortness of breath and wheezing.   Cardiovascular: Negative.  Negative for chest pain, palpitations and leg swelling.  Gastrointestinal: Negative for abdominal pain, constipation, diarrhea, nausea and vomiting.  Genitourinary: Negative.  Negative for difficulty urinating and dysuria.  Musculoskeletal: Negative.  Negative for arthralgias, joint swelling, myalgias and neck stiffness.  Skin: Negative.   Neurological: Negative.  Negative for dizziness, weakness and light-headedness.  Hematological: Negative for adenopathy. Does not bruise/bleed easily.  Psychiatric/Behavioral: Negative.     Objective:  BP 140/70 (BP Location: Left Arm, Patient Position: Sitting, Cuff Size: Large)   Pulse 64   Temp 98.4 F (36.9 C) (Oral)   Resp 16   Ht 5\' 6"  (1.676 m)   Wt 202 lb (91.6 kg)   SpO2 97%   BMI 32.60 kg/m   BP Readings from Last 3 Encounters:  04/19/18 140/70  03/16/18 116/72  03/15/18 132/80    Wt Readings from Last 3 Encounters:  04/19/18  202 lb (91.6 kg)  03/16/18 203 lb (92.1 kg)  03/15/18 203 lb (92.1 kg)    Physical Exam  Constitutional: She is oriented to person, place, and time. No distress.  HENT:  Mouth/Throat: Oropharynx is clear and moist. No oropharyngeal exudate.  Eyes: Conjunctivae are normal. No scleral icterus.  Neck: Normal range of motion. Neck supple. No JVD present. No thyromegaly present.  Cardiovascular: Normal rate, regular rhythm and normal heart sounds. Exam reveals no gallop.  No murmur heard. Pulmonary/Chest: Effort normal and breath sounds  normal. No respiratory distress. She has no wheezes. She has no rales.  Abdominal: Soft. Bowel sounds are normal. She exhibits no mass. There is no hepatosplenomegaly. There is no tenderness.  Musculoskeletal: Normal range of motion. She exhibits no edema, tenderness or deformity.  Lymphadenopathy:    She has no cervical adenopathy.  Neurological: She is alert and oriented to person, place, and time.  Skin: Skin is warm and dry. She is not diaphoretic. No pallor.  She has female pattern balding with a receding frontal hairline and thinning on the crown  Vitals reviewed.   Lab Results  Component Value Date   WBC 5.0 03/30/2018   HGB 15.1 03/30/2018   HCT 47.1 (H) 03/30/2018   PLT 179 03/30/2018   GLUCOSE 92 03/30/2018   CHOL 192 03/30/2018   TRIG 265 (H) 03/30/2018   HDL 47 03/30/2018   LDLDIRECT 150.0 12/28/2017   LDLCALC 92 03/30/2018   ALT 18 03/30/2018   AST 21 03/30/2018   NA 145 (H) 03/30/2018   K 4.0 03/30/2018   CL 102 03/30/2018   CREATININE 0.87 03/30/2018   BUN 16 03/30/2018   CO2 27 03/30/2018   TSH 1.62 11/27/2016   INR 1.02 07/07/2017   HGBA1C 5.5 12/18/2016    Mm Screening Breast Tomo Bilateral  Result Date: 10/07/2017 CLINICAL DATA:  Screening. EXAM: 2D DIGITAL SCREENING BILATERAL MAMMOGRAM WITH 3D TOMO WITH CAD COMPARISON:  Previous exam(s). ACR Breast Density Category b: There are scattered areas of fibroglandular density. FINDINGS: There are no findings suspicious for malignancy. Images were processed with CAD. IMPRESSION: No mammographic evidence of malignancy. A result letter of this screening mammogram will be mailed directly to the patient. RECOMMENDATION: Screening mammogram in one year. (Code:SM-B-01Y) BI-RADS CATEGORY  1: Negative. Electronically Signed   By: Lajean Manes M.D.   On: 10/07/2017 11:20    Assessment & Plan:   [name] was seen today for hypertension.  Diagnoses and all orders for this visit:  Frontal balding- I have recommended that  she try finasteride for this. -     finasteride (PROPECIA) 1 MG tablet; Take 1 tablet (1 mg total) by mouth daily.  Essential hypertension- Her blood pressure is well controlled.  Recent electrolytes and renal function were normal.  Paroxysmal atrial fibrillation ([place])- She is maintaining good rate and rhythm control.  Will continue anticoagulation with Xarelto.   I have discontinued Kristain Filo. [place]'s GARLIC PO, hydrocortisone cream, multivitamin, and diphenoxylate-atropine. I am also having her start on finasteride. Additionally, I am having her maintain her Calcium Carbonate-Vitamin D (CALCIUM 600 + D PO), metoprolol tartrate, ranitidine, sotalol, potassium chloride, Cyanocobalamin, FOSTEUM PLUS, rivaroxaban, chlorthalidone, gabapentin, and Pitavastatin Calcium.  Meds ordered this encounter  Medications  . finasteride (PROPECIA) 1 MG tablet    Sig: Take 1 tablet (1 mg total) by mouth daily.    Dispense:  90 tablet    Refill:  1     Follow-up: Return in about 6  months (around 10/20/2018).  [name], MD

## 2018-04-27 DIAGNOSIS — S46011A Strain of muscle(s) and tendon(s) of the rotator cuff of right shoulder, initial encounter: Secondary | ICD-10-CM | POA: Diagnosis not present

## 2018-04-27 DIAGNOSIS — M25511 Pain in right shoulder: Secondary | ICD-10-CM | POA: Diagnosis not present

## 2018-04-27 DIAGNOSIS — M65312 Trigger thumb, left thumb: Secondary | ICD-10-CM | POA: Diagnosis not present

## 2018-04-28 ENCOUNTER — Telehealth: Payer: Self-pay

## 2018-04-28 NOTE — Telephone Encounter (Signed)
Received faxed denial for pt's Livalo. This is not covered because the statin medications are not documented in the chart as they were tried and failed thru the pharmacy. Explained this to pt and that scheduling department will call to schedule this appt with pharmacist to discuss the pro's cons with her. She sounds very hesitant to take the Repatha -vs- statins/Livalo.

## 2018-05-18 ENCOUNTER — Ambulatory Visit (INDEPENDENT_AMBULATORY_CARE_PROVIDER_SITE_OTHER): Payer: Medicare HMO | Admitting: Pharmacist

## 2018-05-18 DIAGNOSIS — E785 Hyperlipidemia, unspecified: Secondary | ICD-10-CM | POA: Diagnosis not present

## 2018-05-18 DIAGNOSIS — E7800 Pure hypercholesterolemia, unspecified: Secondary | ICD-10-CM

## 2018-05-18 NOTE — Patient Instructions (Signed)
Lipid clinic (Pharmacist) Marenda Accardi/Kristin [phone]  Continue taking Livalo 2 mg daily. Continue lifestyle modifications Repeat fasting blood work in 1 month  Cholesterol Cholesterol is a fat. Your body needs a small amount of cholesterol. Cholesterol (plaque) may build up in your blood vessels (arteries). That makes you more likely to have a heart attack or stroke. You cannot feel your cholesterol level. Having a blood test is the only way to find out if your level is high. Keep your test results. Work with your doctor to keep your cholesterol at a good level. What do the results mean?  Total cholesterol is how much cholesterol is in your blood.  LDL is bad cholesterol. This is the type that can build up. Try to have low LDL.  HDL is good cholesterol. It cleans your blood vessels and carries LDL away. Try to have high HDL.  Triglycerides are fat that the body can store or burn for energy. What are good levels of cholesterol?  Total cholesterol below 200.  LDL below 100 is good for people who have health risks. LDL below 70 is good for people who have very high risks.  HDL above 40 is good. It is best to have HDL of 60 or higher.  Triglycerides below 150. How can I lower my cholesterol? Diet Follow your diet program as told by your doctor.  Choose fish, white meat chicken, or [place] that is roasted or baked. Try not to eat red meat, fried foods, sausage, or lunch meats.  Eat lots of fresh fruits and vegetables.  Choose whole grains, beans, pasta, potatoes, and cereals.  Choose olive oil, corn oil, or canola oil. Only use small amounts.  Try not to eat butter, mayonnaise, shortening, or palm kernel oils.  Try not to eat foods with trans fats.  Choose low-fat or nonfat dairy foods. ? Drink skim or nonfat milk. ? Eat low-fat or nonfat yogurt and cheeses. ? Try not to drink whole milk or cream. ? Try not to eat ice cream, egg yolks, or full-fat cheeses.  Healthy  desserts include angel food cake, ginger snaps, animal crackers, hard candy, popsicles, and low-fat or nonfat frozen yogurt. Try not to eat pastries, cakes, pies, and cookies.  Exercise Follow your exercise program as told by your doctor.  Be more active. Try gardening, walking, and taking the stairs.  Ask your doctor about ways that you can be more active.  Medicine  Take over-the-counter and prescription medicines only as told by your doctor. This information is not intended to replace advice given to you by your health care provider. Make sure you discuss any questions you have with your health care provider. Document Released: 11/14/2008 Document Revised: 03/19/2016 Document Reviewed: 02/28/2016 Elsevier Interactive Patient Education  [employer].

## 2018-05-18 NOTE — Progress Notes (Signed)
Patient ID: [name]                 DOB: 03-05-1933                    MRN: [id number]     HPI: [name] is a 84 y.o. female patient of [name] referred to lipid clinic by Holly Long NP. PMH is significant for PVD, HTN, Afib, and HLD. Patient has a history of statin intolerance, but has tolerated pitavastatin so far. Her insurance has denied her pitavastatin because they could not find a record of statin intolerance. Per patient chart, she has not tolerated simvastatin, rosuvastatin or ezetimibe in the past.  Patient presents to clinic for potential PCSK9i initiation.   Current Medications: Pitavastatin 2 mg  Intolerances: Simvastatin 40 (09/2006) muscle pain Simvastatin 20 mg Rosuvastatin 5 mg (11/2016) muscle pain Ezetimibe 10 (soreness, pt said it was worse than the statins)  LDL goal: <70 mg/dL  Diet: Eats at home most of the time, maybe 1 or 2 meals eating out. Eats a lot of fresh vegetables.  Exercise: Has a 2 acre property that she takes care of, walks for about 30 minutes 2-3 times per week.   Family History: CAD and hypertension in father. Hypertension in sister and two sons as well.  Social History: Non-smoker, does not drink alcohol. Denies illicit drug use. Not married with 3 children and 1 step child.   Labs: 03/30/2018: CHO 192; TG 265; HDL 47; LDL-c 92 (pituvastatin 1mg ) 38% reduction 12/28/2017: CHO 229; TG 349; HDL 43; LDL-d 150   Past Medical History:  Diagnosis Date  . Abnormal nuclear stress test    mild anterolateral septal and inferior ischemia  . Arthritis   . Atherosclerosis of lower extremity with claudication ([place]) 04/17/2015   LEFT LEG  . Atrial fibrillation ([place])   . Claudication ([place])   . Colon cancer ([place])   . Coronary atherosclerosis of unspecified type of vessel, native or graft   . Esophageal stricture   . Fatty liver 11/05/11  . Hiatal hernia   . HLD (hyperlipidemia)   . Hypertension   . Iron deficiency anemia, unspecified   .  Ischemic colitis ([place])   . Neuropathy   . Osteoporosis   . PAF (paroxysmal atrial fibrillation) ([place]) 04/2015  . Unspecified vascular insufficiency of intestine     Current Outpatient Medications on File Prior to Visit  Medication Sig Dispense Refill  . Calcium Carbonate-Vitamin D (CALCIUM 600 + D PO) Take by mouth daily.     . chlorthalidone (HYGROTON) 25 MG tablet Take 1 tablet (25 mg total) by mouth daily. 90 tablet 0  . Cyanocobalamin 2500 MCG CHEW Chew 1 tablet by mouth daily.    . Dietary Management Product (FOSTEUM PLUS) CAPS TAKE ONE CAPSULE BY MOUTH TWICE DAILY 180 capsule 1  . finasteride (PROPECIA) 1 MG tablet Take 1 tablet (1 mg total) by mouth daily. 90 tablet 1  . gabapentin (NEURONTIN) 100 MG capsule Take 1 capsule (100 mg total) by mouth 3 (three) times daily. 270 capsule 0  . metoprolol tartrate (LOPRESSOR) 25 MG tablet Take 1 tablet (25 mg total) by mouth 2 (two) times daily. 180 tablet 3  . Pitavastatin Calcium (LIVALO) 2 MG TABS Take 1 tablet (2 mg total) by mouth daily. 90 tablet 0  . potassium chloride (K-DUR,KLOR-CON) 10 MEQ tablet Take 2 tablets (20 mEq total) by mouth daily. 180 tablet 2  . ranitidine (ZANTAC)  300 MG tablet Take 1 tablet (300 mg total) by mouth every morning. 90 tablet 3  . rivaroxaban (XARELTO) 20 MG TABS tablet Take 1 tablet (20 mg total) by mouth daily with supper. 30 tablet 5  . sotalol (BETAPACE) 120 MG tablet Take 1 tablet (120 mg total) by mouth every 12 (twelve) hours. 180 tablet 3   No current facility-administered medications on file prior to visit.     Allergies  Allergen Reactions  . Crestor [Rosuvastatin Calcium] Other (See Comments)    Muscle aches  . Acetaminophen-Codeine   . Amlodipine Besylate     REACTION: swelling  . Diltiazem Hcl     REACTION: rash  . Hydrocodone-Acetaminophen     nausea  . Irbesartan-Hydrochlorothiazide     REACTION: Dizziness  . Lisinopril     cough  . Omeprazole Nausea Only and Other (See  Comments)    Dizziness, joint pain, weakness in legs, cramps in legs, stomach burned  . Propoxyphene N-Acetaminophen     Headache, and blotchy face  . Valsartan     REACTION: blurred vision  . Warfarin Sodium     REACTION: body aches and bleeding  . Verapamil Nausea Only, Palpitations and Other (See Comments)    Headaches     Dyslipidemia, goal LDL below 70 Patient presents to the lipid clinic today to discuss Livalo vs. a PCSK-9 inhibitor. The patient is currently on 2 mg of Livalo and is tolerating the treatment very well. She has no complaints of adverse effects and her LDL was 92 on her last lipid profile (down from 153) on 1 mg. Patient was presented with both treatment options and decided to continue Livalo 2 mg for now. She was concerned with the cost of the PCSK-9 inhibitors and preferred an oral medication over an injection. We will appeal her insurances rejection of the prior authorization of Livalo 2 mg. Samples were given until the PA is approved. Patient will follow up in 30 days for a lipid profile. Patient was counseled on lifestyle modifications such as exercise and diet.   [name] PharmD Candidate 05/18/2018 12:10 PM   Present during visit. Assessment and plan discussed and approved [name] PharmD, BCPS, [address] [place] [id number] 05/21/2018 10:22 AM

## 2018-05-18 NOTE — Assessment & Plan Note (Signed)
Patient presents to the lipid clinic today to discuss Livalo vs. a PCSK-9 inhibitor. The patient is currently on 2 mg of Livalo and is tolerating the treatment very well. She has no complaints of adverse effects and her LDL was 92 on her last lipid profile (down from 153) on 1 mg. Patient was presented with both treatment options and decided to continue Livalo 2 mg for now. She was concerned with the cost of the PCSK-9 inhibitors and preferred an oral medication over an injection. We will appeal her insurances rejection of the prior authorization of Livalo 2 mg. Samples were given until the PA is approved. Patient will follow up in 30 days for a lipid profile. Patient was counseled on lifestyle modifications such as exercise and diet.

## 2018-05-21 ENCOUNTER — Encounter: Payer: Self-pay | Admitting: Pharmacist

## 2018-06-14 ENCOUNTER — Emergency Department (HOSPITAL_COMMUNITY): Payer: Medicare HMO

## 2018-06-14 ENCOUNTER — Encounter (HOSPITAL_COMMUNITY): Payer: Self-pay | Admitting: Neurology

## 2018-06-14 ENCOUNTER — Other Ambulatory Visit: Payer: Self-pay | Admitting: Internal Medicine

## 2018-06-14 ENCOUNTER — Emergency Department (HOSPITAL_COMMUNITY)
Admission: EM | Admit: 2018-06-14 | Discharge: 2018-06-14 | Disposition: A | Discharge status: Home / Self Care | Payer: Medicare HMO | Attending: Emergency Medicine | Admitting: Emergency Medicine

## 2018-06-14 ENCOUNTER — Other Ambulatory Visit: Payer: Self-pay

## 2018-06-14 ENCOUNTER — Other Ambulatory Visit (HOSPITAL_COMMUNITY): Payer: Self-pay | Admitting: Nurse Practitioner

## 2018-06-14 DIAGNOSIS — I4891 Unspecified atrial fibrillation: Secondary | ICD-10-CM | POA: Diagnosis present

## 2018-06-14 DIAGNOSIS — Z79899 Other long term (current) drug therapy: Secondary | ICD-10-CM | POA: Insufficient documentation

## 2018-06-14 DIAGNOSIS — I10 Essential (primary) hypertension: Secondary | ICD-10-CM | POA: Insufficient documentation

## 2018-06-14 DIAGNOSIS — R002 Palpitations: Secondary | ICD-10-CM | POA: Diagnosis not present

## 2018-06-14 DIAGNOSIS — I480 Paroxysmal atrial fibrillation: Secondary | ICD-10-CM | POA: Insufficient documentation

## 2018-06-14 DIAGNOSIS — R0789 Other chest pain: Secondary | ICD-10-CM | POA: Diagnosis not present

## 2018-06-14 DIAGNOSIS — R0602 Shortness of breath: Secondary | ICD-10-CM | POA: Diagnosis not present

## 2018-06-14 LAB — BASIC METABOLIC PANEL
ANION GAP: 8 (ref 5–15)
BUN: 12 mg/dL (ref 8–23)
CHLORIDE: 107 mmol/L (ref 98–111)
CO2: 29 mmol/L (ref 22–32)
Calcium: 9.7 mg/dL (ref 8.9–10.3)
Creatinine, Ser: 1.03 mg/dL — ABNORMAL HIGH (ref 0.44–1.00)
GFR calc non Af Amer: 49 mL/min — ABNORMAL LOW (ref >60)
GFR, EST AFRICAN AMERICAN: 57 mL/min — AB (ref >60)
Glucose, Bld: 105 mg/dL — ABNORMAL HIGH (ref 70–99)
Potassium: 4.1 mmol/L (ref 3.5–5.1)
Sodium: 144 mmol/L (ref 135–145)

## 2018-06-14 LAB — CBC
HEMATOCRIT: 45.1 % (ref 36.0–46.0)
Hemoglobin: 14.1 g/dL (ref 12.0–15.0)
MCH: 30.1 pg (ref 26.0–34.0)
MCHC: 31.3 g/dL (ref 30.0–36.0)
MCV: 96.2 fL (ref 80.0–100.0)
NRBC: 0 % (ref 0.0–0.2)
PLATELETS: 211 10*3/uL (ref 150–400)
RBC: 4.69 MIL/uL (ref 3.87–5.11)
RDW: 13.2 % (ref 11.5–15.5)
WBC: 5.9 10*3/uL (ref 4.0–10.5)

## 2018-06-14 LAB — I-STAT TROPONIN, ED: Troponin i, poc: 0 ng/mL (ref 0.00–0.08)

## 2018-06-14 MED ORDER — ETOMIDATE 2 MG/ML IV SOLN
7.0000 mg | Freq: Once | INTRAVENOUS | Status: DC
  Filled 2018-06-14: qty 10

## 2018-06-14 NOTE — ED Notes (Signed)
Patient verbalizes understanding of discharge instructions. Opportunity for questioning and answers were provided. Armband removed by staff, pt discharged from ED to home ambulatory.  

## 2018-06-14 NOTE — ED Provider Notes (Signed)
[facility] EMERGENC[facility] DEPARTMENT Provider Note   CSN: [id number] Arrival date & time: 06/14/18  1207     History   Chief Complaint Chief Complaint  Patient presents with  . Atrial Fibrillation    HPI [name] is a 84 y.o. female.  HPI   11PM felt it go out of rhythm Tightness in chest, feels dyspnea with moving around Reports can tell when goes into atrial fibrillation Metoprolol, sotalol and xarelto, has not missed any doses  No recent illness, no nausea, no vomiting, no diarrhea, no cough, no fever, no caffeine, unclear trigger  Now feels funny, chest tighthess, mild dyspnea, feels similar to afib in the past  Last time in it July 10 2017, tried cardioversion, first 2 not successful, last was  Past Medical History:  Diagnosis Date  . Abnormal nuclear stress test    mild anterolateral septal and inferior ischemia  . Arthritis   . Atherosclerosis of lower extremity with claudication ([place]) 04/17/2015   LEFT LEG  . Atrial fibrillation ([place])   . Claudication ([place])   . Colon cancer ([place])   . Coronary atherosclerosis of unspecified type of vessel, native or graft   . Esophageal stricture   . Fatty liver 11/05/11  . Hiatal hernia   . HLD (hyperlipidemia)   . Hypertension   . Iron deficiency anemia, unspecified   . Ischemic colitis ([place])   . Neuropathy   . Osteoporosis   . PAF (paroxysmal atrial fibrillation) ([place]) 04/2015  . Unspecified vascular insufficiency of intestine     Patient Active Problem List   Diagnosis Date Noted  . Frontal balding 04/19/2018  . Anticoagulated 07/08/2017  . Neuromyelopathy due to vitamin B12 deficiency ([place]) 12/01/2016  . GERD (gastroesophageal reflux disease) 02/25/2015  . Obesity (BMI 30.0-34.9) 07/19/2014  . Stricture and stenosis of esophagus 04/27/2014  . Rectal cancer ([place]) 10/01/2011  . Routine general medical examination at a health care facility 07/21/2011  . Atrial fibrillation ([place])  11/28/2010  . HTN (hypertension) 11/28/2010  . CROHN'S DISEASE, LARGE INTESTINE 01/09/2009  . Dyslipidemia, goal LDL below 70 11/08/2007  . MITRAL REGURGITATION, 0 (MILD) 11/08/2007  . Osteoporosis 11/08/2007    Past Surgical History:  Procedure Laterality Date  . abdominoperineal resection anastomotic stricture  05/2007  . APPENDECTOMY    . CARDIAC CATHETERIZATION N/A 03/13/2015   Procedure: Left Heart Cath and Coronary Angiography;  Surgeon: Troy Sine, MD;  Location: [facility];  Service: Cardiovascular;  Laterality: N/A;  . CARDIOVERSION N/A 04/20/2015   Procedure: CARDIOVERSION;  Surgeon: Jerline Pain, MD;  Location: [facility];  Service: Cardiovascular;  Laterality: N/A;  . CARDIOVERSION N/A 07/10/2017   Procedure: CARDIOVERSION;  Surgeon: Jerline Pain, MD;  Location: [facility];  Service: Cardiovascular;  Laterality: N/A;  . [place]  . ESOPHAGOGASTRODUODENOSCOPY N/A 04/27/2014   Procedure: ESOPHAGOGASTRODUODENOSCOPY (EGD);  Surgeon: Lafayette Dragon, MD;  Location: [facility];  Service: Endoscopy;  Laterality: N/A;  . Mandibular Renstruction    . ROTATOR CUFF REPAIR  2006   left  . Rt. Salpingo oophorectomy and cyst removal  2005  . SAVORY DILATION N/A 04/27/2014   Procedure: SAVORY DILATION;  Surgeon: Lafayette Dragon, MD;  Location: [facility];  Service: Endoscopy;  Laterality: N/A;  no xray needed  . sigmoid resection for invasive rectal adenocarcinoma  12/2006  . TEE WITHOUT CARDIOVERSION N/A 04/20/2015   Procedure: TRANSESOPHAGEAL ECHOCARDIOGRAM (TEE);  Surgeon: Jerline Pain,  MD;  Location: [facility];  Service: Cardiovascular;  Laterality: N/A;  . TUBAL LIGATION    . WRIST SURGERY  2008   right     OB History   None      Home Medications    Prior to Admission medications   Medication Sig Start Date End Date Taking? Authorizing Provider  acetaminophen (TYLENOL) 500 MG tablet Take 1,000 mg by mouth every 6 (six) hours  as needed for headache (pain).   Yes [provider]  Calcium Carbonate-Vitamin D (CALCIUM-D PO) Take 1 tablet by mouth at bedtime. Calcium 630 mg, Vitamin D3 500 units   Yes [provider]  chlorthalidone (HYGROTON) 25 MG tablet Take 1 tablet (25 mg total) by mouth daily. 03/30/18  Yes Janith Lima, MD  Cyanocobalamin 2500 MCG TABS Take 2,500 mcg by mouth daily. Vitamin B12   Yes [provider]  Dietary Management Product ([place]) CAPS TAKE ONE CAPSULE BY MOUTH TWICE DAILY Patient taking differently: Take 1 capsule by mouth 2 (two) times daily.  02/23/18  Yes Janith Lima, MD  finasteride (PROPECIA) 1 MG tablet Take 1 tablet (1 mg total) by mouth daily. Patient taking differently: Take 1 mg by mouth at bedtime.  04/19/18  Yes Janith Lima, MD  gabapentin (NEURONTIN) 100 MG capsule TAKE 1 CAPSULE THREE TIMES DAILY Patient taking differently: Take 100 mg by mouth See admin instructions. Take one capsule (100 mg) by mouth three times daily - breakfast, supper and bedtime 06/14/18  Yes Janith Lima, MD  metoprolol tartrate (LOPRESSOR) 25 MG tablet Take 1 tablet (25 mg total) by mouth 2 (two) times daily. 09/15/17  Yes Janith Lima, MD  Multiple Vitamin (MULTIVITAMIN WITH MINERALS) TABS tablet Take 1 tablet by mouth daily.   Yes [provider]  Pitavastatin Calcium (LIVALO) 2 MG TABS Take 1 tablet (2 mg total) by mouth daily. 04/13/18  Yes Lendon Colonel, NP  Polyethyl Glycol-Propyl Glycol (SYSTANE OP) Place 1 drop into both eyes daily.   Yes [provider]  potassium chloride (K-DUR,KLOR-CON) 10 MEQ tablet TAKE 2 TABLETS (20 MEQ TOTAL) BY MOUTH DAILY (DOSE INCREASED DUE TO HYPOKALEMIA) Patient taking differently: Take 10 mEq by mouth 2 (two) times daily.  06/14/18  Yes Troy Sine, MD  ranitidine (ZANTAC) 300 MG tablet Take 1 tablet (300 mg total) by mouth every morning. Patient taking differently: Take 300 mg by mouth daily.   09/15/17  Yes Janith Lima, MD  rivaroxaban (XARELTO) 20 MG TABS tablet Take 1 tablet (20 mg total) by mouth daily with supper. Patient taking differently: Take 20 mg by mouth daily with breakfast.  03/15/18  Yes Lendon Colonel, NP  sotalol (BETAPACE) 120 MG tablet Take 1 tablet (120 mg total) by mouth every 12 (twelve) hours. 09/15/17  Yes Janith Lima, MD    Family History Family History  Problem Relation Age of Onset  . Colon cancer Father 42  . Heart disease Father   . Hypertension Father   . Alzheimer's disease Mother   . Thyroid disease Mother   . Hypertension Sister   . Thyroid disease Sister   . Mitral valve prolapse Sister   . Hypertension Son   . Diabetes Son   . Hypertension Son   . Diabetes Son   . Esophageal cancer Neg Hx   . Rectal cancer Neg Hx   . Stomach cancer Neg Hx     Social History Social History  Tobacco Use  . Smoking status: Never Smoker  . Smokeless tobacco: Never Used  Substance Use Topics  . Alcohol use: No  . Drug use: No     Allergies   Crestor [rosuvastatin calcium]; Acetaminophen-codeine; Amlodipine besylate; Hydrocodone-acetaminophen; Irbesartan-hydrochlorothiazide; Lisinopril; Omeprazole; Propoxyphene n-acetaminophen; Valsartan; Warfarin sodium; Diltiazem hcl; and Verapamil   Review of Systems Review of Systems  Constitutional: Negative for fever.  HENT: Negative for sore throat.   Eyes: Negative for visual disturbance.  Respiratory: Positive for shortness of breath. Negative for cough.   Cardiovascular: Positive for chest pain and palpitations.  Gastrointestinal: Negative for abdominal pain, diarrhea, nausea and vomiting.  Genitourinary: Negative for difficulty urinating and dysuria.  Musculoskeletal: Negative for back pain and neck pain.  Skin: Negative for rash.  Neurological: Negative for syncope and headaches.     Physical Exam Updated Vital Signs BP 121/73   Pulse 65   Temp 98.2 F (36.8 C) (Oral)   Resp  15   Ht 5\' 6"  (1.676 m)   Wt 89.8 kg   SpO2 98%   BMI 31.96 kg/m   Physical Exam  Constitutional: She is oriented to person, place, and time. She appears well-developed and well-nourished. No distress.  HENT:  Head: Normocephalic and atraumatic.  Eyes: Conjunctivae and EOM are normal.  Neck: Normal range of motion.  Cardiovascular: Normal heart sounds and intact distal pulses. An irregularly irregular rhythm present. Tachycardia present. Exam reveals no gallop and no friction rub.  No murmur heard. Pulmonary/Chest: Effort normal and breath sounds normal. No respiratory distress. She has no wheezes. She has no rales.  Abdominal: Soft. She exhibits no distension. There is no tenderness. There is no guarding.  Musculoskeletal: She exhibits no edema or tenderness.  Neurological: She is alert and oriented to person, place, and time.  Skin: Skin is warm and dry. No rash noted. She is not diaphoretic. No erythema.  Nursing note and vitals reviewed.    ED Treatments / Results  Labs (all labs ordered are listed, but only abnormal results are displayed) Labs Reviewed  BASIC METABOLIC PANEL - Abnormal; Notable for the following components:      Result Value   Glucose, Bld 105 (*)    Creatinine, Ser 1.03 (*)    GFR calc non Af Amer 49 (*)    GFR calc Af Amer 57 (*)    All other components within normal limits  CBC  I-STAT TROPONIN, ED    EKG EKG Interpretation  Date/Time:  Monday June 14 2018 12:10:16 EDT Ventricular Rate:  121 PR Interval:    QRS Duration: 76 QT Interval:  354 QTC Calculation: 502 R Axis:   -4 Text Interpretation:  Atrial fibrillation with rapid ventricular response Low voltage QRS Inferior infarct , age undetermined Cannot rule out Anterior infarct , age undetermined Abnormal ECG Since prior ECG, patient is now in atrial fibrillation with rapid ventricular response Confirmed by Gareth Morgan [phone]) on 06/14/2018 1:01:59 PM   Radiology Dg Chest 2  View  Result Date: 06/14/2018 CLINICAL DATA:  Onset of mid chest tightness around 11 p.m. last night associated with shortness of breath. History of atrial fibrillation, coronary artery disease, nonsmoker. History of colonic malignancy. EXAM: CHEST - 2 VIEW COMPARISON:  Portable chest x-ray of July 07, 2017 FINDINGS: The lungs are well-expanded. There is no focal infiltrate. There is no pleural effusion. The heart and pulmonary vascularity are normal. The mediastinum is normal in width. The trachea is midline. There is calcification in the wall  of the aortic arch. The bony thorax exhibits no acute abnormality. IMPRESSION: Mild chronic bronchitic changes, stable. No pneumonia, CHF, nor other acute cardiopulmonary abnormality. Thoracic aortic atherosclerosis. Electronically Signed   By: David  Martinique M.D.   On: 06/14/2018 12:54    Procedures Procedures (including critical care time)  Medications Ordered in ED Medications - No data to display   Initial Impression / Assessment and Plan / ED Course  I have reviewed the triage vital signs and the nursing notes.  Pertinent labs & imaging results that were available during my care of the patient were reviewed by me and considered in my medical decision making (see chart for details).    84 year old female with history above including history of atrial fibrillation on metoprolol, sotalol and Xarelto, who presents with concern for chest chest tightness, dyspnea and palpitations similar to her prior episodes of atrial fibrillation.  Patient reports that she can feel herself going into atrial fibrillation last night at 11 PM.  Blood pressure is stable in the emergency department, normal mentation, no significant dyspnea.  Troponin within normal limits.  Given history of occasional difficulty with cardioversion in the past, called Cardiology to discuss cardioversion. Dr. Margaretann Loveless came to bedside to evaluate the patient and feels attempted ED cardioversion  appropriate. Discussed with patient and prepared for Cardioversion, however as we were preparing staff and medications, patient spontaneously converted to sinus rhythm.  She is asymptomatic after cardioversion.  Recommend follow up with Cardiology regarding her afib. Will not make medication change at this time. Patient discharged in stable condition with understanding of reasons to return.   Final Clinical Impressions(s) / ED Diagnoses   Final diagnoses:  Atrial fibrillation with [facility])    ED Discharge Orders    None       Gareth Morgan, MD 06/15/18 0104

## 2018-06-14 NOTE — ED Triage Notes (Signed)
Pt reports hx of AFIB, last night was lying in bed and felt her heart go out of rhythm in AFIB, has been here recently for same with cardioversion. Reports feeling sob, CP. She took extra metoprolol doses but didn't work.

## 2018-06-14 NOTE — Consult Note (Addendum)
Cardiology Consult note:   Patient ID: [name] MRN: [id number]; DOB: 1932/09/29   Admission date: 06/14/2018  Primary Care Provider: Janith Lima, MD Primary Cardiologist:Dr. Claiborne Billings Primary Electrophysiologist:  Dr. Rayann Heman   Chief Complaint:  afib   Patient Profile:   [name] is a 84 y.o. female with hx of PAFib maintained on a/c and sotalol, HTN, HLD, PVD and chronic diastolic heart failure presented with afib who is seen at request of Dr. Billy Fischer.   Hx of PAF with prior cardioversion x 2. Last 07/2017 and increased sotalol to 120mg  BID.   Patient  has a history of PAD, with symptoms of claudication requiring ABIs which were found to be abnormal with left ABI 0.63.  The left common iliac external iliac and common femoral artery and profunda demonstrated multiphasic flow, however,she was found to have a superficial femoral artery on the left revealing occlusive disease in the proximal to mid thigh with reconstruction of flow in the mid distal segment..  The popliteal artery demonstrated patent monophasic flow with three-vessel runoff.  The anterior tibial artery demonstrated local stenosis in the proximal calf and often flow distal to this.  The patient did not tolerate simvastatin due to myalgias, and refused further lipid-lowering therapy.  The patient was last seen by Dr. Claiborne Billings on 02/08/2018.   Last seen by APP 03/15/18 as she wanted to try Xarelto instead of Eliquis to see if her symptoms of her hair falling out and some joint pain.  History of Present Illness:   [name] went into atrial fibrillation last night at 11 PM while laying on her bed.  She can tell when he goes out of rhythm.  Last episode 07/2017 when she had a cardioversion in hospital.  This is her first episode since then.  She feels short of breath, chest pain and palpitation with fatigue when he goes in atrial fibrillation.  Rate was constantly elevated and came to ER for further evaluation.  She  was noted in A. fib at rate of 121 bpm by EKG-personally reviewed.  She has chronic stable dyspnea on exertion.  She does 2 acres of yard work without any limitation.  He denies orthopnea, PND, syncope, dizziness, lower extremity edema, melena or blood in his stool or urine.  She is compliant with Xarelto and has not missed any single dose.  Creatinine, hemoglobin and electrolytes are stable.  Chest x-ray without acute cardiopulmonary disease.  It did showed thoracic aortic atherosclerosis. HR in 90-120s here. She is allergic to Cardizem.   Last echo 01/2017 as below: Study Conclusions  - Left ventricle: The cavity size was normal. Wall thickness was   increased in a pattern of moderate LVH. Systolic function was   normal. The estimated ejection fraction was in the range of 60%   to 65%. Wall motion was normal; there were no regional wall   motion abnormalities. Features are consistent with a pseudonormal   left ventricular filling pattern, with concomitant abnormal   relaxation and increased filling pressure (grade 2 diastolic   dysfunction).  Past Medical History:  Diagnosis Date  . Abnormal nuclear stress test    mild anterolateral septal and inferior ischemia  . Arthritis   . Atherosclerosis of lower extremity with claudication ([place]) 04/17/2015   LEFT LEG  . Atrial fibrillation ([place])   . Claudication ([place])   . Colon cancer ([place])   . Coronary atherosclerosis of unspecified type of vessel, native or graft   . Esophageal stricture   .  Fatty liver 11/05/11  . Hiatal hernia   . HLD (hyperlipidemia)   . Hypertension   . Iron deficiency anemia, unspecified   . Ischemic colitis ([place])   . Neuropathy   . Osteoporosis   . PAF (paroxysmal atrial fibrillation) ([place]) 04/2015  . Unspecified vascular insufficiency of intestine     Past Surgical History:  Procedure Laterality Date  . abdominoperineal resection anastomotic stricture  05/2007  . APPENDECTOMY    . CARDIAC CATHETERIZATION N/A  03/13/2015   Procedure: Left Heart Cath and Coronary Angiography;  Surgeon: Troy Sine, MD;  Location: [facility];  Service: Cardiovascular;  Laterality: N/A;  . CARDIOVERSION N/A 04/20/2015   Procedure: CARDIOVERSION;  Surgeon: Jerline Pain, MD;  Location: [facility];  Service: Cardiovascular;  Laterality: N/A;  . CARDIOVERSION N/A 07/10/2017   Procedure: CARDIOVERSION;  Surgeon: Jerline Pain, MD;  Location: [facility];  Service: Cardiovascular;  Laterality: N/A;  . [place]  . ESOPHAGOGASTRODUODENOSCOPY N/A 04/27/2014   Procedure: ESOPHAGOGASTRODUODENOSCOPY (EGD);  Surgeon: Lafayette Dragon, MD;  Location: [facility];  Service: Endoscopy;  Laterality: N/A;  . Mandibular Renstruction    . ROTATOR CUFF REPAIR  2006   left  . Rt. Salpingo oophorectomy and cyst removal  2005  . SAVORY DILATION N/A 04/27/2014   Procedure: SAVORY DILATION;  Surgeon: Lafayette Dragon, MD;  Location: [facility];  Service: Endoscopy;  Laterality: N/A;  no xray needed  . sigmoid resection for invasive rectal adenocarcinoma  12/2006  . TEE WITHOUT CARDIOVERSION N/A 04/20/2015   Procedure: TRANSESOPHAGEAL ECHOCARDIOGRAM (TEE);  Surgeon: Jerline Pain, MD;  Location: [facility];  Service: Cardiovascular;  Laterality: N/A;  . TUBAL LIGATION    . WRIST SURGERY  2008   right     Medications Prior to Admission: Prior to Admission medications   Medication Sig Start Date End Date Taking? Authorizing Provider  Calcium Carbonate-Vitamin D (CALCIUM 600 + D PO) Take by mouth daily.     [provider]  chlorthalidone (HYGROTON) 25 MG tablet Take 1 tablet (25 mg total) by mouth daily. 03/30/18   Janith Lima, MD  Cyanocobalamin 2500 MCG CHEW Chew 1 tablet by mouth daily.    [provider]  Dietary Management Product (FOSTEUM PLUS) CAPS TAKE ONE CAPSULE BY MOUTH TWICE DAILY 02/23/18   Janith Lima, MD  finasteride (PROPECIA) 1 MG tablet Take 1 tablet (1 mg  total) by mouth daily. 04/19/18   Janith Lima, MD  gabapentin (NEURONTIN) 100 MG capsule TAKE 1 CAPSULE THREE TIMES DAILY 06/14/18   Janith Lima, MD  metoprolol tartrate (LOPRESSOR) 25 MG tablet Take 1 tablet (25 mg total) by mouth 2 (two) times daily. 09/15/17   Janith Lima, MD  Pitavastatin Calcium (LIVALO) 2 MG TABS Take 1 tablet (2 mg total) by mouth daily. 04/13/18   Lendon Colonel, NP  potassium chloride (K-DUR,KLOR-CON) 10 MEQ tablet TAKE 2 TABLETS (20 MEQ TOTAL) BY MOUTH DAILY (DOSE INCREASED DUE TO HYPOKALEMIA) 06/14/18   Troy Sine, MD  ranitidine (ZANTAC) 300 MG tablet Take 1 tablet (300 mg total) by mouth every morning. 09/15/17   Janith Lima, MD  rivaroxaban (XARELTO) 20 MG TABS tablet Take 1 tablet (20 mg total) by mouth daily with supper. 03/15/18   Lendon Colonel, NP  sotalol (BETAPACE) 120 MG tablet Take 1 tablet (120 mg total) by mouth every 12 (twelve) hours. 09/15/17   [name]  L, MD     Allergies:    Allergies  Allergen Reactions  . Crestor [Rosuvastatin Calcium] Other (See Comments)    Muscle aches  . Acetaminophen-Codeine   . Amlodipine Besylate     REACTION: swelling  . Diltiazem Hcl     REACTION: rash  . Hydrocodone-Acetaminophen     nausea  . Irbesartan-Hydrochlorothiazide     REACTION: Dizziness  . Lisinopril     cough  . Omeprazole Nausea Only and Other (See Comments)    Dizziness, joint pain, weakness in legs, cramps in legs, stomach burned  . Propoxyphene N-Acetaminophen     Headache, and blotchy face  . Valsartan     REACTION: blurred vision  . Warfarin Sodium     REACTION: body aches and bleeding  . Verapamil Nausea Only, Palpitations and Other (See Comments)    Headaches     Social History:   Social History   Socioeconomic History  . Marital status: Widowed    Spouse name: Not on file  . Number of children: 4  . Years of education: 12th grade  . Highest education level: Not on file  Occupational History  .  Occupation: retired    Fish farm manager: RETIRED  Social Needs  . Financial resource strain: Not on file  . Food insecurity:    Worry: Not on file    Inability: Not on file  . Transportation needs:    Medical: Not on file    Non-medical: Not on file  Tobacco Use  . Smoking status: Never Smoker  . Smokeless tobacco: Never Used  Substance and Sexual Activity  . Alcohol use: No  . Drug use: No  . Sexual activity: Not Currently  Lifestyle  . Physical activity:    Days per week: Not on file    Minutes per session: Not on file  . Stress: Not on file  Relationships  . Social connections:    Talks on phone: Not on file    Gets together: Not on file    Attends religious service: Not on file    Active member of club or organization: Not on file    Attends meetings of clubs or organizations: Not on file    Relationship status: Not on file  . Intimate partner violence:    Fear of current or ex partner: Not on file    Emotionally abused: Not on file    Physically abused: Not on file    Forced sexual activity: Not on file  Other Topics Concern  . Not on file  Social History Narrative   Lives at home with fiance.   Right-handed.   No caffeine use.    Family History:  The patient's family history includes Alzheimer's disease in her mother; Colon cancer (age of onset: 65) in her father; Diabetes in her son and son; Heart disease in her father; Hypertension in her father, sister, son, and son; Mitral valve prolapse in her sister; Thyroid disease in her mother and sister. There is no history of Esophageal cancer, Rectal cancer, or Stomach cancer.    ROS:  Please see the history of present illness.  All other ROS reviewed and negative.     Physical Exam/Data:   Vitals:   06/14/18 1445 06/14/18 1515 06/14/18 1600 06/14/18 1636  BP: 114/78 115/84 111/83 (!) 135/93  Pulse: (!) 104 81 99 (!) 107  Resp: 17 (!) 24 15 15   Temp:      TempSrc:      SpO2: 99%  96% 99% 99%  Weight:      Height:        No intake or output data in the 24 hours ending 06/14/18 1711 Filed Weights   06/14/18 1212  Weight: 89.8 kg   Body mass index is 31.96 kg/m.  General:  Well nourished, well developed, in no acute distress HEENT: normal Lymph: no adenopathy Neck: no  JVD Endocrine:  No thryomegaly Vascular: No carotid bruits; FA pulses 2+ bilaterally without bruits  Cardiac:  normal S1, S2;Ir IR tachycardiac; no murmur  Lungs:  clear to auscultation bilaterally, no wheezing, rhonchi or rales  Abd: soft, nontender, no hepatomegaly  Ext: no edema Musculoskeletal:  No deformities, BUE and BLE strength normal and equal Skin: warm and dry  Neuro:  CNs 2-12 intact, no focal abnormalities noted Psych:  Normal affect      Relevant CV Studies: As above  Laboratory Data:  Chemistry Recent Labs  Lab 06/14/18 1222  NA 144  K 4.1  CL 107  CO2 29  GLUCOSE 105*  BUN 12  CREATININE 1.03*  CALCIUM 9.7  GFRNONAA 49*  GFRAA 57*  ANIONGAP 8    Hematology Recent Labs  Lab 06/14/18 1222  WBC 5.9  RBC 4.69  HGB 14.1  HCT 45.1  MCV 96.2  MCH 30.1  MCHC 31.3  RDW 13.2  PLT 211   Recent Labs  Lab 06/14/18 1247  TROPIPOC 0.00    Radiology/Studies:  Dg Chest 2 View  Result Date: 06/14/2018 CLINICAL DATA:  Onset of mid chest tightness around 11 p.m. last night associated with shortness of breath. History of atrial fibrillation, coronary artery disease, nonsmoker. History of colonic malignancy. EXAM: CHEST - 2 VIEW COMPARISON:  Portable chest x-ray of July 07, 2017 FINDINGS: The lungs are well-expanded. There is no focal infiltrate. There is no pleural effusion. The heart and pulmonary vascularity are normal. The mediastinum is normal in width. The trachea is midline. There is calcification in the wall of the aortic arch. The bony thorax exhibits no acute abnormality. IMPRESSION: Mild chronic bronchitic changes, stable. No pneumonia, CHF, nor other acute cardiopulmonary abnormality.  Thoracic aortic atherosclerosis. Electronically Signed   By: David  Martinique M.D.   On: 06/14/2018 12:54    Assessment and Plan:   1.  Paroxysmal atrial fibrillation with prior cardioversion x 2 (last 07/2017) -Patient went into atrial fibrillation last night at 11 PM.  She is compliant with her Xarelto, metoprolol and sotalol..  No bleeding issue. She is symptomatic when goes in atrial fibrillation.  CHADSVASc score of 5. Will benefit from rhythm control. Will plan cardioversion in ER as she hasn't miss any dose of anticoagulation and then follow up in afib in clinic few days to discuss different antiarrhythmic vs ablation. Take extra metoprolol for HR above 110bpm. Allergic to cardizem.   For questions or updates, please contact [place] Please consult [email or web address] for contact info under   SignedLeanor Kail, PA  06/14/2018 5:11 PM   -------------------------------------------------------------------------------------------------------------   History and all data above reviewed.  Patient examined.  I agree with the findings as above.  [name] has a history of afib, and distinctly recalls when she went into afib yesterday evening.   Constitutional: No acute distress ENMT: moist mucous membranes Cardiovascular: irregular rhythm, rate 90-110, no murmurs. S1 and S2 normal. Radial pulses normal bilaterally. No jugular venous distention.  Respiratory: clear to auscultation bilaterally GI : normal bowel sounds, soft and nontender. No distention.   MSK:  extremities warm, well perfused. No edema.  NEURO: grossly nonfocal exam, moves all extremities. PSYCH: alert and oriented x 3, normal mood and affect.   All available labs, radiology testing, previous records reviewed. Agree with documented assessment and plan of my colleague as stated above with the following additions or changes:  Active Problems:   Atrial fibrillation [facility])    Plan: Our colleagues in the  emergency department we will plan to cardiovert the patient per ED protocol.  This is per patient preference as well.  The patient ensures that she has not missed a single dose of her Xarelto, and is certain that she went into atrial fibrillation within 48 hours.  While silent episodes of atrial fibrillation could have occurred, without a skipped dose of her anticoagulation, I would be reasonable to consider cardioversion in the emergency department given that she is having some chest discomfort.  If cardioversion is successful, she should plan for follow-up in the atrial fibrillation clinic given that she is on a rhythm control strategy with sotalol and this has not kept her out of atrial fibrillation.  If she is unable to be converted out of atrial fibrillation and continues to have chest pain, would recommend hospital admission for observation and rate control.  Elouise Munroe, MD [facility] 6:48 PM  06/14/2018

## 2018-06-15 ENCOUNTER — Other Ambulatory Visit: Payer: Self-pay | Admitting: Pharmacist Clinician (PhC)/ Clinical Pharmacy Specialist

## 2018-06-15 MED ORDER — RIVAROXABAN 20 MG PO TABS: 20.0000 mg | ORAL_TABLET | Freq: Every day | ORAL | 1 refills | Status: DC

## 2018-06-16 ENCOUNTER — Telehealth (HOSPITAL_COMMUNITY): Payer: Self-pay | Admitting: *Deleted

## 2018-06-16 NOTE — Telephone Encounter (Signed)
Pt recently seen in ED for afib, converted before her dccv.  LMOM for pt to call to sched f/u

## 2018-06-17 ENCOUNTER — Telehealth: Payer: Self-pay | Admitting: Cardiovascular Disease

## 2018-06-17 NOTE — Telephone Encounter (Signed)
Spoke to Brent from [facility], requesting information in regards to PA for Livalo.     Livalo initiated by K. Lawrence DNP, per chart this was denied.      Request questions faxed to [phone] for [place] to complete and return.    Request faxed.

## 2018-06-17 NOTE — Telephone Encounter (Signed)
Yes I am waiting to appeal answer.  Please send related correspondence to me  Thanks

## 2018-06-18 ENCOUNTER — Telehealth: Payer: Self-pay

## 2018-06-18 DIAGNOSIS — E7800 Pure hypercholesterolemia, unspecified: Secondary | ICD-10-CM | POA: Diagnosis not present

## 2018-06-18 LAB — LIPID PANEL
CHOL/HDL RATIO: 3.5 ratio (ref 0.0–4.4)
Cholesterol, Total: 181 mg/dL (ref 100–199)
HDL: 51 mg/dL (ref >39)
LDL CALC: 83 mg/dL (ref 0–99)
TRIGLYCERIDES: 233 mg/dL — AB (ref 0–149)
VLDL Cholesterol Cal: 47 mg/dL — ABNORMAL HIGH (ref 5–40)

## 2018-06-18 NOTE — Telephone Encounter (Signed)
Patient walked in office requesting Livalo 2 mg and Xarelto 20 mg samples.Livalo 2 mg samples given.Office out of Xarelto 20 mg samples.

## 2018-06-21 ENCOUNTER — Ambulatory Visit (HOSPITAL_COMMUNITY)
Admission: RE | Admit: 2018-06-21 | Discharge: 2018-06-21 | Disposition: A | Discharge status: Home / Self Care | Payer: Medicare HMO | Source: Ambulatory Visit | Attending: Nurse Practitioner | Admitting: Nurse Practitioner

## 2018-06-21 ENCOUNTER — Other Ambulatory Visit: Payer: Self-pay | Admitting: Pharmacist Clinician (PhC)/ Clinical Pharmacy Specialist

## 2018-06-21 ENCOUNTER — Encounter (HOSPITAL_COMMUNITY): Payer: Self-pay | Admitting: Nurse Practitioner

## 2018-06-21 VITALS — BP 134/72 | HR 54 | Ht 66.0 in | Wt 198.0 lb

## 2018-06-21 DIAGNOSIS — E785 Hyperlipidemia, unspecified: Secondary | ICD-10-CM | POA: Insufficient documentation

## 2018-06-21 DIAGNOSIS — Z79899 Other long term (current) drug therapy: Secondary | ICD-10-CM | POA: Insufficient documentation

## 2018-06-21 DIAGNOSIS — D509 Iron deficiency anemia, unspecified: Secondary | ICD-10-CM | POA: Diagnosis not present

## 2018-06-21 DIAGNOSIS — I480 Paroxysmal atrial fibrillation: Secondary | ICD-10-CM | POA: Diagnosis not present

## 2018-06-21 DIAGNOSIS — I10 Essential (primary) hypertension: Secondary | ICD-10-CM | POA: Insufficient documentation

## 2018-06-21 DIAGNOSIS — G629 Polyneuropathy, unspecified: Secondary | ICD-10-CM | POA: Insufficient documentation

## 2018-06-21 DIAGNOSIS — M1990 Unspecified osteoarthritis, unspecified site: Secondary | ICD-10-CM | POA: Diagnosis not present

## 2018-06-21 DIAGNOSIS — I2510 Atherosclerotic heart disease of native coronary artery without angina pectoris: Secondary | ICD-10-CM | POA: Insufficient documentation

## 2018-06-21 DIAGNOSIS — Z8249 Family history of ischemic heart disease and other diseases of the circulatory system: Secondary | ICD-10-CM | POA: Diagnosis not present

## 2018-06-21 DIAGNOSIS — Z7901 Long term (current) use of anticoagulants: Secondary | ICD-10-CM | POA: Insufficient documentation

## 2018-06-21 DIAGNOSIS — I4891 Unspecified atrial fibrillation: Secondary | ICD-10-CM | POA: Diagnosis present

## 2018-06-21 DIAGNOSIS — Z85038 Personal history of other malignant neoplasm of large intestine: Secondary | ICD-10-CM | POA: Diagnosis not present

## 2018-06-21 MED ORDER — RIVAROXABAN 20 MG PO TABS: 20.0000 mg | ORAL_TABLET | Freq: Every day | ORAL | 1 refills | Status: DC

## 2018-06-21 NOTE — Progress Notes (Signed)
Primary Care Physician: Janith Lima, MD Referring Physician: [facility] ER f/u Cardiologist: Dr. Jeralene Peters is a 84 y.o. female with a h/o afib that is in SR with use of sotalol. She had a breakthrough episode of afib that she presented to the  ER for. She was going to be cardioverted but returned to SR in preparation for DCCV.No obvious triggers. She is in SR today. A breakthrough episode of afib is very unusual for her.  Today, she denies symptoms of palpitations, chest pain, shortness of breath, orthopnea, PND, lower extremity edema, dizziness, presyncope, syncope, or neurologic sequela. The patient is tolerating medications without difficulties and is otherwise without complaint today.   Past Medical History:  Diagnosis Date  . Abnormal nuclear stress test    mild anterolateral septal and inferior ischemia  . Arthritis   . Atherosclerosis of lower extremity with claudication ([place]) 04/17/2015   LEFT LEG  . Atrial fibrillation ([place])   . Claudication ([place])   . Colon cancer ([place])   . Coronary atherosclerosis of unspecified type of vessel, native or graft   . Esophageal stricture   . Fatty liver 11/05/11  . Hiatal hernia   . HLD (hyperlipidemia)   . Hypertension   . Iron deficiency anemia, unspecified   . Ischemic colitis ([place])   . Neuropathy   . Osteoporosis   . PAF (paroxysmal atrial fibrillation) ([place]) 04/2015  . Unspecified vascular insufficiency of intestine    Past Surgical History:  Procedure Laterality Date  . abdominoperineal resection anastomotic stricture  05/2007  . APPENDECTOMY    . CARDIAC CATHETERIZATION N/A 03/13/2015   Procedure: Left Heart Cath and Coronary Angiography;  Surgeon: Troy Sine, MD;  Location: [facility];  Service: Cardiovascular;  Laterality: N/A;  . CARDIOVERSION N/A 04/20/2015   Procedure: CARDIOVERSION;  Surgeon: Jerline Pain, MD;  Location: [facility];  Service: Cardiovascular;  Laterality: N/A;  . CARDIOVERSION N/A  07/10/2017   Procedure: CARDIOVERSION;  Surgeon: Jerline Pain, MD;  Location: [facility];  Service: Cardiovascular;  Laterality: N/A;  . [place]  . ESOPHAGOGASTRODUODENOSCOPY N/A 04/27/2014   Procedure: ESOPHAGOGASTRODUODENOSCOPY (EGD);  Surgeon: Lafayette Dragon, MD;  Location: [facility];  Service: Endoscopy;  Laterality: N/A;  . Mandibular Renstruction    . ROTATOR CUFF REPAIR  2006   left  . Rt. Salpingo oophorectomy and cyst removal  2005  . SAVORY DILATION N/A 04/27/2014   Procedure: SAVORY DILATION;  Surgeon: Lafayette Dragon, MD;  Location: [facility];  Service: Endoscopy;  Laterality: N/A;  no xray needed  . sigmoid resection for invasive rectal adenocarcinoma  12/2006  . TEE WITHOUT CARDIOVERSION N/A 04/20/2015   Procedure: TRANSESOPHAGEAL ECHOCARDIOGRAM (TEE);  Surgeon: Jerline Pain, MD;  Location: [facility];  Service: Cardiovascular;  Laterality: N/A;  . TUBAL LIGATION    . WRIST SURGERY  2008   right    Current Outpatient Medications  Medication Sig Dispense Refill  . acetaminophen (TYLENOL) 500 MG tablet Take 1,000 mg by mouth every 6 (six) hours as needed for headache (pain).    . Calcium Carbonate-Vitamin D (CALCIUM-D PO) Take 1 tablet by mouth at bedtime. Calcium 630 mg, Vitamin D3 500 units    . chlorthalidone (HYGROTON) 25 MG tablet Take 1 tablet (25 mg total) by mouth daily. 90 tablet 0  . Cyanocobalamin 2500 MCG TABS Take 2,500 mcg by mouth daily. Vitamin B12    . Dietary Management  Product (FOSTEUM PLUS) CAPS TAKE ONE CAPSULE BY MOUTH TWICE DAILY (Patient taking differently: Take 1 capsule by mouth 2 (two) times daily. ) 180 capsule 1  . finasteride (PROPECIA) 1 MG tablet Take 1 tablet (1 mg total) by mouth daily. (Patient taking differently: Take 1 mg by mouth at bedtime. ) 90 tablet 1  . gabapentin (NEURONTIN) 100 MG capsule TAKE 1 CAPSULE THREE TIMES DAILY (Patient taking differently: Take 100 mg by mouth See admin instructions. Take  one capsule (100 mg) by mouth three times daily - breakfast, supper and bedtime) 270 capsule 1  . metoprolol tartrate (LOPRESSOR) 25 MG tablet Take 1 tablet (25 mg total) by mouth 2 (two) times daily. 180 tablet 3  . Multiple Vitamin (MULTIVITAMIN WITH MINERALS) TABS tablet Take 1 tablet by mouth daily.    . Pitavastatin Calcium (LIVALO) 2 MG TABS Take 1 tablet (2 mg total) by mouth daily. 90 tablet 0  . Polyethyl Glycol-Propyl Glycol (SYSTANE OP) Place 1 drop into both eyes daily.    . potassium chloride (K-DUR,KLOR-CON) 10 MEQ tablet TAKE 2 TABLETS (20 MEQ TOTAL) BY MOUTH DAILY (DOSE INCREASED DUE TO HYPOKALEMIA) (Patient taking differently: Take 10 mEq by mouth 2 (two) times daily. ) 180 tablet 2  . ranitidine (ZANTAC) 300 MG tablet Take 1 tablet (300 mg total) by mouth every morning. (Patient taking differently: Take 300 mg by mouth daily. ) 90 tablet 3  . rivaroxaban (XARELTO) 20 MG TABS tablet Take 1 tablet (20 mg total) by mouth daily with supper. 90 tablet 1  . sotalol (BETAPACE) 120 MG tablet Take 1 tablet (120 mg total) by mouth every 12 (twelve) hours. 180 tablet 3   No current facility-administered medications for this encounter.     Allergies  Allergen Reactions  . Crestor [Rosuvastatin Calcium] Other (See Comments)    Muscle aches  . Acetaminophen-Codeine Other (See Comments)    Unknown reaction - possibly nausea  . Amlodipine Besylate Swelling    Hands and feet swelling  . Hydrocodone-Acetaminophen Nausea And Vomiting  . Irbesartan-Hydrochlorothiazide Other (See Comments)    REACTION: Dizziness  . Lisinopril Cough  . Omeprazole Nausea Only and Other (See Comments)    Dizziness, joint pain, weakness in legs, cramps in legs, stomach burned  . Propoxyphene N-Acetaminophen Other (See Comments)    Headache, and blotchy face  . Valsartan Other (See Comments)    REACTION: blurred vision  . Warfarin Sodium Other (See Comments)    REACTION: body aches and bleeding  . Diltiazem  Hcl Rash  . Verapamil Nausea Only, Palpitations and Other (See Comments)    Headaches     Social History   Socioeconomic History  . Marital status: Widowed    Spouse name: Not on file  . Number of children: 4  . Years of education: 12th grade  . Highest education level: Not on file  Occupational History  . Occupation: retired    Fish farm manager: RETIRED  Social Needs  . Financial resource strain: Not on file  . Food insecurity:    Worry: Not on file    Inability: Not on file  . Transportation needs:    Medical: Not on file    Non-medical: Not on file  Tobacco Use  . Smoking status: Never Smoker  . Smokeless tobacco: Never Used  Substance and Sexual Activity  . Alcohol use: No  . Drug use: No  . Sexual activity: Not Currently  Lifestyle  . Physical activity:    Days per week:  Not on file    Minutes per session: Not on file  . Stress: Not on file  Relationships  . Social connections:    Talks on phone: Not on file    Gets together: Not on file    Attends religious service: Not on file    Active member of club or organization: Not on file    Attends meetings of clubs or organizations: Not on file    Relationship status: Not on file  . Intimate partner violence:    Fear of current or ex partner: Not on file    Emotionally abused: Not on file    Physically abused: Not on file    Forced sexual activity: Not on file  Other Topics Concern  . Not on file  Social History Narrative   Lives at home with fiance.   Right-handed.   No caffeine use.    Family History  Problem Relation Age of Onset  . Colon cancer Father 10  . Heart disease Father   . Hypertension Father   . Alzheimer's disease Mother   . Thyroid disease Mother   . Hypertension Sister   . Thyroid disease Sister   . Mitral valve prolapse Sister   . Hypertension Son   . Diabetes Son   . Hypertension Son   . Diabetes Son   . Esophageal cancer Neg Hx   . Rectal cancer Neg Hx   . Stomach cancer Neg Hx      ROS- All systems are reviewed and negative except as per the HPI above  Physical Exam: Vitals:   06/21/18 1133  BP: 134/72  Pulse: (!) 54  Weight: 89.8 kg  Height: 5\' 6"  (1.676 m)   Wt Readings from Last 3 Encounters:  06/21/18 89.8 kg  06/14/18 89.8 kg  04/19/18 91.6 kg    Labs: Lab Results  Component Value Date   NA 144 06/14/2018   K 4.1 06/14/2018   CL 107 06/14/2018   CO2 29 06/14/2018   GLUCOSE 105 (H) 06/14/2018   BUN 12 06/14/2018   CREATININE 1.03 (H) 06/14/2018   CALCIUM 9.7 06/14/2018   MG 1.8 07/17/2017   Lab Results  Component Value Date   INR 1.02 07/07/2017   Lab Results  Component Value Date   CHOL 181 06/18/2018   HDL 51 06/18/2018   LDLCALC 83 06/18/2018   TRIG 233 (H) 06/18/2018     GEN- The patient is well appearing, alert and oriented x 3 today.   Head- normocephalic, atraumatic Eyes-  Sclera clear, conjunctiva pink Ears- hearing intact Oropharynx- clear Neck- supple, no JVP Lymph- no cervical lymphadenopathy Lungs- Clear to ausculation bilaterally, normal work of breathing Heart- Regular rate and rhythm, no murmurs, rubs or gallops, PMI not laterally displaced GI- soft, NT, ND, + BS Extremities- no clubbing, cyanosis, or edema MS- no significant deformity or atrophy Skin- no rash or lesion Psych- euthymic mood, full affect Neuro- strength and sensation are intact  EKG-Sinus brady at 54 bpm, pr int 168 ms, qrs int 76 ms, qtc 466 ms ER records reviewed    Assessment and Plan: 1. Afib  Usually stays in SR with sotalol 120 mg bid Had a rare break through episode and self converted In SR today  No identified trigger Continue xarelto 20 mg qd for a chadsvasc score of at least 4  F/u with Dr. Claiborne Billings 12/18  Butch Penny C. Carroll, [facility] [address] [place], [place] [address] [phone]

## 2018-06-22 ENCOUNTER — Telehealth: Payer: Self-pay | Admitting: Gastroenterology

## 2018-06-22 MED ORDER — DIPHENOXYLATE-ATROPINE 2.5-0.025 MG PO TABS: 1.0000 | ORAL_TABLET | Freq: Two times a day (BID) | ORAL | 1 refills | Status: DC

## 2018-06-22 NOTE — Telephone Encounter (Signed)
Dr Silverio Decamp  Patient states she needs refill of Lomotil but im not seeing it on her medication list currently it was on there from 12/21/2017 Can she have refills of this

## 2018-06-22 NOTE — Telephone Encounter (Signed)
Sent prescription in by fax today

## 2018-06-22 NOTE — Telephone Encounter (Signed)
Ok to send Rx for Lomotil 1 tab q12h as needed X 30 tabs with 1 refill. Thanks

## 2018-06-23 ENCOUNTER — Other Ambulatory Visit: Payer: Self-pay | Admitting: Pharmacist

## 2018-06-23 MED ORDER — OMEGA-3-ACID ETHYL ESTERS 1 G PO CAPS: 1.0000 g | ORAL_CAPSULE | Freq: Two times a day (BID) | ORAL | 1 refills | Status: DC

## 2018-06-23 MED ORDER — PITAVASTATIN CALCIUM 2 MG PO TABS: 2.0000 mg | ORAL_TABLET | Freq: Every day | ORAL | 1 refills | Status: DC

## 2018-06-23 NOTE — Telephone Encounter (Signed)
PA for livalo approved.  Rx fro 90day sent to [facility].  Lovaza 1gm BID started as well. Rx sent to [facility] per patient request

## 2018-06-24 MED ORDER — CHLORTHALIDONE 25 MG PO TABS: 25.0000 mg | ORAL_TABLET | Freq: Every day | ORAL | 1 refills | Status: DC

## 2018-06-24 NOTE — Addendum Note (Signed)
Addended by: Aviva Signs M on: 06/24/2018 10:54 AM   Modules accepted: Orders

## 2018-07-02 ENCOUNTER — Telehealth: Payer: Self-pay | Admitting: Cardiovascular Disease

## 2018-07-02 NOTE — Telephone Encounter (Signed)
Called patient, advised of note from PharmD. Patient was still unsure of taking it. I did advise that if she did take it to be more aware. Patient verbalized understanding.

## 2018-07-02 NOTE — Telephone Encounter (Signed)
New Message:   Patient calling having some concerns about som omega 3. She would like for someone to call her back.

## 2018-07-02 NOTE — Telephone Encounter (Signed)
In trials it was noted that atrial fib was more frequent in the first 2-3 months in patients taking omega 3 oils compared to placebo.  There is no known clinical significance. She should be fine to take the omega 3, just let her know if she develops more symptomatic AF, she should let her cardiologist know.

## 2018-07-02 NOTE — Telephone Encounter (Signed)
Called patient, she states that on the back of her Omega 3 bottle it states a big caution message that people with Afib, and Aflutter should not take this medication as it can cause an increase in these issues.   Patient has a history of Afib, and had cardioversion, she would like to know if she can take this or not take it.  Please advise. Thank you!

## 2018-08-02 ENCOUNTER — Other Ambulatory Visit: Payer: Self-pay | Admitting: Internal Medicine

## 2018-08-16 ENCOUNTER — Other Ambulatory Visit: Payer: Self-pay | Admitting: Internal Medicine

## 2018-08-18 ENCOUNTER — Ambulatory Visit: Payer: Medicare HMO | Admitting: Cardiovascular Disease

## 2018-09-06 ENCOUNTER — Other Ambulatory Visit: Payer: Self-pay | Admitting: Internal Medicine

## 2018-09-13 ENCOUNTER — Other Ambulatory Visit: Payer: Self-pay | Admitting: Internal Medicine

## 2018-09-13 DIAGNOSIS — Z1231 Encounter for screening mammogram for malignant neoplasm of breast: Secondary | ICD-10-CM

## 2018-09-29 ENCOUNTER — Encounter: Payer: Self-pay | Admitting: Internal Medicine

## 2018-09-29 ENCOUNTER — Other Ambulatory Visit (INDEPENDENT_AMBULATORY_CARE_PROVIDER_SITE_OTHER): Payer: Medicare HMO

## 2018-09-29 ENCOUNTER — Ambulatory Visit (INDEPENDENT_AMBULATORY_CARE_PROVIDER_SITE_OTHER): Payer: Medicare HMO | Admitting: Internal Medicine

## 2018-09-29 VITALS — BP 142/78 | HR 60 | Temp 97.8°F | Resp 16 | Ht 66.0 in | Wt 204.0 lb

## 2018-09-29 DIAGNOSIS — I10 Essential (primary) hypertension: Secondary | ICD-10-CM

## 2018-09-29 DIAGNOSIS — E785 Hyperlipidemia, unspecified: Secondary | ICD-10-CM

## 2018-09-29 DIAGNOSIS — I480 Paroxysmal atrial fibrillation: Secondary | ICD-10-CM

## 2018-09-29 DIAGNOSIS — L659 Nonscarring hair loss, unspecified: Secondary | ICD-10-CM

## 2018-09-29 LAB — BASIC METABOLIC PANEL
BUN: 23 mg/dL (ref 6–23)
CALCIUM: 10.2 mg/dL (ref 8.4–10.5)
CO2: 30 mEq/L (ref 19–32)
Chloride: 104 mEq/L (ref 96–112)
Creatinine, Ser: 0.82 mg/dL (ref 0.40–1.20)
GFR: 66.45 mL/min (ref >60.00)
Glucose, Bld: 100 mg/dL — ABNORMAL HIGH (ref 70–99)
Potassium: 3.9 mEq/L (ref 3.5–5.1)
Sodium: 144 mEq/L (ref 135–145)

## 2018-09-29 LAB — MAGNESIUM: Magnesium: 1.9 mg/dL (ref 1.5–2.5)

## 2018-09-29 MED ORDER — RIVAROXABAN 20 MG PO TABS: 20.0000 mg | ORAL_TABLET | Freq: Every day | ORAL | 1 refills | Status: DC

## 2018-09-29 MED ORDER — METOPROLOL TARTRATE 25 MG PO TABS: 25.0000 mg | ORAL_TABLET | Freq: Two times a day (BID) | ORAL | 1 refills | Status: DC

## 2018-09-29 MED ORDER — FINASTERIDE 1 MG PO TABS: 1.0000 mg | ORAL_TABLET | Freq: Every day | ORAL | 1 refills | Status: DC

## 2018-09-29 MED ORDER — PITAVASTATIN CALCIUM 2 MG PO TABS: 2.0000 mg | ORAL_TABLET | Freq: Every day | ORAL | 1 refills | Status: DC

## 2018-09-29 NOTE — Patient Instructions (Signed)

## 2018-09-29 NOTE — Progress Notes (Signed)
Subjective:  Patient ID: [name], female    DOB: Oct 23, 1932  Age: 85 y.o. MRN: [id number]  CC: Hypertension and Atrial Fibrillation   HPI [name] presents for f/up - She has been doing well recently.  She denies any recent episodes of palpitations, CP, DOE, dizziness, lightheadedness, or edema.  She is losing less hair since she started finasteride and she wants to continue taking it.  Outpatient Medications Prior to Visit  Medication Sig Dispense Refill  . acetaminophen (TYLENOL) 500 MG tablet Take 1,000 mg by mouth every 6 (six) hours as needed for headache (pain).    . Calcium Carbonate-Vitamin D (CALCIUM-D PO) Take 1 tablet by mouth at bedtime. Calcium 630 mg, Vitamin D3 500 units    . chlorthalidone (HYGROTON) 25 MG tablet Take 1 tablet (25 mg total) by mouth daily. 90 tablet 1  . Cyanocobalamin 2500 MCG TABS Take 2,500 mcg by mouth daily. Vitamin B12    . Dietary Management Product (FOSTEUM PLUS) CAPS TAKE ONE CAPSULE BY MOUTH TWICE DAILY 180 capsule 1  . gabapentin (NEURONTIN) 100 MG capsule TAKE 1 CAPSULE THREE TIMES DAILY (Patient taking differently: Take 100 mg by mouth See admin instructions. Take one capsule (100 mg) by mouth three times daily - breakfast, supper and bedtime) 270 capsule 1  . Multiple Vitamin (MULTIVITAMIN WITH MINERALS) TABS tablet Take 1 tablet by mouth daily.    Marland Kitchen omega-3 acid ethyl esters (LOVAZA) 1 g capsule Take 1 capsule (1 g total) by mouth 2 (two) times daily. 180 capsule 1  . Polyethyl Glycol-Propyl Glycol (SYSTANE OP) Place 1 drop into both eyes daily.    . potassium chloride (K-DUR,KLOR-CON) 10 MEQ tablet TAKE 2 TABLETS (20 MEQ TOTAL) BY MOUTH DAILY (DOSE INCREASED DUE TO HYPOKALEMIA) (Patient taking differently: Take 10 mEq by mouth 2 (two) times daily. ) 180 tablet 2  . ranitidine (ZANTAC) 300 MG tablet Take 1 tablet (300 mg total) by mouth every morning. (Patient taking differently: Take 300 mg by mouth daily. ) 90 tablet 3  .  sotalol (BETAPACE) 120 MG tablet Take 1 tablet (120 mg total) by mouth every 12 (twelve) hours. 180 tablet 3  . diphenoxylate-atropine (LOMOTIL) 2.5-0.025 MG tablet Take 1 tablet by mouth every 12 (twelve) hours. 30 tablet 1  . finasteride (PROPECIA) 1 MG tablet Take 1 tablet (1 mg total) by mouth daily. (Patient taking differently: Take 1 mg by mouth at bedtime. ) 90 tablet 1  . metoprolol tartrate (LOPRESSOR) 25 MG tablet Take 1 tablet (25 mg total) by mouth 2 (two) times daily. 180 tablet 3  . Pitavastatin Calcium (LIVALO) 2 MG TABS Take 1 tablet (2 mg total) by mouth daily. 90 tablet 1  . rivaroxaban (XARELTO) 20 MG TABS tablet Take 1 tablet (20 mg total) by mouth daily with supper. 90 tablet 1   No facility-administered medications prior to visit.     ROS Review of Systems  Constitutional: Negative.  Negative for diaphoresis, fatigue and unexpected weight change.  HENT: Negative.   Eyes: Negative for visual disturbance.  Respiratory: Negative for cough, chest tightness, shortness of breath and wheezing.   Cardiovascular: Negative for palpitations and leg swelling.  Gastrointestinal: Negative for abdominal pain, constipation, diarrhea, nausea and vomiting.  Genitourinary: Negative.  Negative for difficulty urinating.  Musculoskeletal: Negative for arthralgias and myalgias.  Skin: Negative.  Negative for color change, pallor and rash.  Neurological: Negative.  Negative for dizziness, weakness, light-headedness and headaches.  Hematological: Negative for adenopathy.  Does not bruise/bleed easily.  Psychiatric/Behavioral: Negative.     Objective:  BP (!) 142/78 (BP Location: Left Arm, Patient Position: Sitting, Cuff Size: Normal)   Pulse 60   Temp 97.8 F (36.6 C) (Oral)   Resp 16   Ht 5\' 6"  (1.676 m)   Wt 204 lb (92.5 kg)   SpO2 96%   BMI 32.93 kg/m   BP Readings from Last 3 Encounters:  09/29/18 (!) 142/78  06/21/18 134/72  06/14/18 121/73    Wt Readings from Last 3  Encounters:  09/29/18 204 lb (92.5 kg)  06/21/18 198 lb (89.8 kg)  06/14/18 198 lb (89.8 kg)    Physical Exam Vitals signs reviewed.  HENT:     Nose: Nose normal. No congestion or rhinorrhea.     Mouth/Throat:     Mouth: Mucous membranes are moist.     Pharynx: No oropharyngeal exudate or posterior oropharyngeal erythema.  Eyes:     General: No scleral icterus.    Conjunctiva/sclera: Conjunctivae normal.  Neck:     Musculoskeletal: Normal range of motion and neck supple. No neck rigidity or muscular tenderness.  Cardiovascular:     Rate and Rhythm: Normal rate and regular rhythm.     Heart sounds: No murmur. No gallop.   Pulmonary:     Breath sounds: No stridor. No wheezing, rhonchi or rales.  Abdominal:     General: Abdomen is flat. Bowel sounds are normal.     Palpations: There is no hepatomegaly, splenomegaly or mass.     Tenderness: There is no abdominal tenderness.  Musculoskeletal: Normal range of motion.        General: No swelling.     Right lower leg: No edema.     Left lower leg: No edema.  Lymphadenopathy:     Cervical: No cervical adenopathy.  Skin:    General: Skin is warm and dry.     Coloration: Skin is not pale.  Neurological:     General: No focal deficit present.     Mental Status: She is oriented to person, place, and time. Mental status is at baseline.     Lab Results  Component Value Date   WBC 5.9 06/14/2018   HGB 14.1 06/14/2018   HCT 45.1 06/14/2018   PLT 211 06/14/2018   GLUCOSE 100 (H) 09/29/2018   CHOL 181 06/18/2018   TRIG 233 (H) 06/18/2018   HDL 51 06/18/2018   LDLDIRECT 150.0 12/28/2017   LDLCALC 83 06/18/2018   ALT 18 03/30/2018   AST 21 03/30/2018   NA 144 09/29/2018   K 3.9 09/29/2018   CL 104 09/29/2018   CREATININE 0.82 09/29/2018   BUN 23 09/29/2018   CO2 30 09/29/2018   TSH 1.62 11/27/2016   INR 1.02 07/07/2017   HGBA1C 5.5 12/18/2016    No results found.  Assessment & Plan:   [name] was seen today for  hypertension and atrial fibrillation.  Diagnoses and all orders for this visit:  Paroxysmal atrial fibrillation [facility])- She is maintaining sinus rhythm with metoprolol and sotalol.  We will continue Xarelto at the current dose. -     rivaroxaban (XARELTO) 20 MG TABS tablet; Take 1 tablet (20 mg total) by mouth daily with supper. -     Basic metabolic panel; Future -     Magnesium; Future -     metoprolol tartrate (LOPRESSOR) 25 MG tablet; Take 1 tablet (25 mg total) by mouth 2 (two) times daily.  Essential hypertension- Her blood  pressure is adequately well controlled.  Electrolytes and renal function are normal. -     Basic metabolic panel; Future -     Magnesium; Future -     metoprolol tartrate (LOPRESSOR) 25 MG tablet; Take 1 tablet (25 mg total) by mouth 2 (two) times daily.  Frontal balding -     finasteride (PROPECIA) 1 MG tablet; Take 1 tablet (1 mg total) by mouth at bedtime.  Dyslipidemia, goal LDL below 100- She is doing well on Livalo.  Will continue. -     Pitavastatin Calcium (LIVALO) 2 MG TABS; Take 1 tablet (2 mg total) by mouth daily.   I have discontinued [name]. [name]'s diphenoxylate-atropine. I have also changed her finasteride. Additionally, I am having her maintain her ranitidine, sotalol, gabapentin, potassium chloride, multivitamin with minerals, Calcium Carbonate-Vitamin D (CALCIUM-D PO), Cyanocobalamin, Polyethyl Glycol-Propyl Glycol (SYSTANE OP), acetaminophen, omega-3 acid ethyl esters, chlorthalidone, FOSTEUM PLUS, rivaroxaban, metoprolol tartrate, and Pitavastatin Calcium.  Meds ordered this encounter  Medications  . rivaroxaban (XARELTO) 20 MG TABS tablet    Sig: Take 1 tablet (20 mg total) by mouth daily with supper.    Dispense:  90 tablet    Refill:  1  . finasteride (PROPECIA) 1 MG tablet    Sig: Take 1 tablet (1 mg total) by mouth at bedtime.    Dispense:  90 tablet    Refill:  1  . metoprolol tartrate (LOPRESSOR) 25 MG tablet    Sig: Take 1  tablet (25 mg total) by mouth 2 (two) times daily.    Dispense:  180 tablet    Refill:  1  . Pitavastatin Calcium (LIVALO) 2 MG TABS    Sig: Take 1 tablet (2 mg total) by mouth daily.    Dispense:  90 tablet    Refill:  1     Follow-up: Return in about 6 months (around 03/30/2019).  [name], MD

## 2018-10-13 ENCOUNTER — Other Ambulatory Visit: Payer: Self-pay | Admitting: Internal Medicine

## 2018-11-17 ENCOUNTER — Ambulatory Visit: Payer: Medicare HMO

## 2018-11-19 ENCOUNTER — Telehealth: Payer: Self-pay

## 2018-11-19 NOTE — Telephone Encounter (Signed)
   Cardiac Questionnaire:    Since your last visit or hospitalization:    1. Have you been having new or worsening chest pain? NO   2. Have you been having new or worsening shortness of breath? NO 3. Have you been having new or worsening leg swelling, wt gain, or increase in abdominal girth (pants fitting more tightly)? NO - pt states small swelling in ankles, advised to elevate, and to use compression stockings, call if worsening, patient verbalized understanding.    4. Have you had any passing out spells? NO          Primary Cardiologist:  Dr.Kelly  Patient contacted.  History reviewed.  No symptoms to suggest any unstable cardiac conditions.  Based on discussion, with current pandemic situation, we will be postponing this appointment for [facility].  If symptoms change, she has been instructed to contact our office.   Routing to C19 CANCEL pool for tracking (P CV DIV CV19 CANCEL) and assigning priority (1 = 4-6 wks, 2 = 6-12 wks, 3 = >12 wks).  Priority 2  Ena Dawley, LPN  3/71/6967 89:38 PM         .

## 2018-11-22 ENCOUNTER — Ambulatory Visit: Payer: Medicare HMO | Admitting: Cardiovascular Disease

## 2018-12-07 ENCOUNTER — Other Ambulatory Visit: Payer: Self-pay | Admitting: Internal Medicine

## 2018-12-07 ENCOUNTER — Telehealth: Payer: Self-pay | Admitting: Internal Medicine

## 2018-12-07 DIAGNOSIS — K219 Gastro-esophageal reflux disease without esophagitis: Secondary | ICD-10-CM

## 2018-12-07 MED ORDER — FAMOTIDINE 40 MG PO TABS: 40.0000 mg | ORAL_TABLET | Freq: Every day | ORAL | 1 refills | Status: DC

## 2018-12-07 NOTE — Telephone Encounter (Signed)
Routing to dr jones, please advise, thanks 

## 2018-12-07 NOTE — Telephone Encounter (Signed)
Copied from [place] [phone]. Topic: Quick Communication - See Telephone Encounter >> Dec 07, 2018  9:49 AM Ahmed Prima L wrote: CRM for notification. See Telephone encounter for: 12/07/18.  Patient states that [facility] sent her a letter stating that they can no longer get ranitidine (ZANTAC) 300 MG tablet and recommended Omeprazole ( she said she is allergic ). They would like Dr Ronnald Ramp to prescribe her something else for her acid reflux. Thanks.

## 2018-12-09 ENCOUNTER — Ambulatory Visit: Payer: Medicare HMO

## 2018-12-15 ENCOUNTER — Ambulatory Visit: Payer: Medicare HMO

## 2019-01-10 ENCOUNTER — Other Ambulatory Visit: Payer: Self-pay | Admitting: Internal Medicine

## 2019-01-20 ENCOUNTER — Other Ambulatory Visit: Payer: Self-pay | Admitting: Internal Medicine

## 2019-01-26 DIAGNOSIS — H903 Sensorineural hearing loss, bilateral: Secondary | ICD-10-CM | POA: Diagnosis not present

## 2019-02-15 ENCOUNTER — Other Ambulatory Visit: Payer: Self-pay | Admitting: Cardiovascular Disease

## 2019-02-15 ENCOUNTER — Other Ambulatory Visit: Payer: Self-pay | Admitting: Internal Medicine

## 2019-02-15 DIAGNOSIS — I10 Essential (primary) hypertension: Secondary | ICD-10-CM

## 2019-02-16 ENCOUNTER — Ambulatory Visit: Payer: Medicare HMO

## 2019-02-25 ENCOUNTER — Telehealth: Payer: Self-pay | Admitting: Cardiovascular Disease

## 2019-02-25 NOTE — Telephone Encounter (Signed)
LVM for pt, reminding her of her appt on 02-28-19 with Dr Claiborne Billings.

## 2019-02-28 ENCOUNTER — Ambulatory Visit (INDEPENDENT_AMBULATORY_CARE_PROVIDER_SITE_OTHER): Payer: Medicare HMO | Admitting: Cardiovascular Disease

## 2019-02-28 ENCOUNTER — Encounter: Payer: Self-pay | Admitting: Cardiovascular Disease

## 2019-02-28 ENCOUNTER — Other Ambulatory Visit: Payer: Self-pay

## 2019-02-28 VITALS — BP 136/80 | HR 59 | Ht 66.0 in | Wt 203.0 lb

## 2019-02-28 DIAGNOSIS — I480 Paroxysmal atrial fibrillation: Secondary | ICD-10-CM | POA: Diagnosis not present

## 2019-02-28 DIAGNOSIS — I739 Peripheral vascular disease, unspecified: Secondary | ICD-10-CM

## 2019-02-28 DIAGNOSIS — E785 Hyperlipidemia, unspecified: Secondary | ICD-10-CM | POA: Diagnosis not present

## 2019-02-28 DIAGNOSIS — I10 Essential (primary) hypertension: Secondary | ICD-10-CM | POA: Diagnosis not present

## 2019-02-28 DIAGNOSIS — I4891 Unspecified atrial fibrillation: Secondary | ICD-10-CM | POA: Diagnosis not present

## 2019-02-28 DIAGNOSIS — I519 Heart disease, unspecified: Secondary | ICD-10-CM

## 2019-02-28 DIAGNOSIS — I5189 Other ill-defined heart diseases: Secondary | ICD-10-CM

## 2019-02-28 DIAGNOSIS — G609 Hereditary and idiopathic neuropathy, unspecified: Secondary | ICD-10-CM | POA: Diagnosis not present

## 2019-02-28 DIAGNOSIS — Z7901 Long term (current) use of anticoagulants: Secondary | ICD-10-CM

## 2019-02-28 NOTE — Progress Notes (Signed)
Patient ID: [name], female   DOB: February 07, 1933, 85 y.o.   MRN: [id number]    PCP: Dr. Eilleen Kempf  HPI: [name] is a 85 y.o. female who presents for a 80 month cardiology evaluation.  [name] has a history of paroxysmal atrial fibrillation, hypertension, as well as hyperlipidemia. Remotely, she developed myalgias with simvastatin and had not been willing to try additional lipid lowering therapy. In August 2012 an echo Doppler study showed mild asymmetric LVH with proximal septal thickening without LVOT obstruction. She had grade 1 diastolic dysfunction with normal systolic function, mild MR, mild TR, and aortic valve sclerosis with mild MR.   In December 2014 follow-up blood work showed normal renal function with a BUN of 19, creatinine 0.8.  She continued to have significant hyperlipidemia with a total cholesterol of 250, triglycerides 221 HDL 53, VLDL 44 and LDL cholesterol 153.  TSH was normal at 2.275.   [name]  had noticed development of claudication, particularly involving her left leg with activity.  She had episodes of shortness of breath and fatigue.  She  underwent a lower extremity arterial Doppler study which was abnormal and showed an ABI of 0.63 on the left and 1.0 on the right.  The left common iliac, external iliac, common femoral artery and profunda demonstrated multiphasic flow.  However, the superficial femoral artery on the left demonstrated occlusive disease in the proximal to mid thigh with reconstitution of flow in the mid distal segment.  The popliteal artery demonstrated patent monophasic flow with three-vessel runoff.  The anterior tibial artery demonstrated focal stenosis in the proximal calf with dampened flow distal to this.  She presented to [name] hospital in August 2016 with complaints of palpitations, shortness of breath and presyncope.  She was noted to be in atrial fibrillation with rapid ventricular response.  Her heart rate was initially attempted to  be controlled with metoprolol and digitoxin, which was not successful and ultimately sotalol was started.  On 04/20/2015 she underwent successful TEE guided cardioversion.  She was continued on metoprolol as well as eliquis for anticoagulation.  An echo Doppler study on 04/18/2015 showed an EF of 55-60% with mild LVH.  She has multiple allergies and in the past did not tolerate ACE-inhibition or ARB therapy and was able to tolerate direct renin inhibition.  She has a history of GERD and has been taking ranitidine and sucralfate.  She has a history of hyperlipidemia and has had issues with statins.  When I saw her in October 2017 she was maintaining sinus rhythm.  She  presented to the emergency room on 01/15/2017 with chest tightness, shortness of breath and palpitations.  She was found to be back in atrial fibrillation.  She has continued to take anticoagulation with eloquence 5 mg twice a day.  During that evaluation, she underwent successful cardioversion and sinus rhythm was restored with 200 J cardioversion.   When I last saw her in May 2018, she was maintaining sinus rhythm.  I discussed the possibility of sleep apnea in the etiology of her recurrent AF but she did not want to pursue a sleep evaluation.  She underwent a follow-up echo Doppler study on 02/06/2017 which showed an EF of 60-65%.  There was moderate LVH, grade 2 diastolic dysfunction, and she was without major valvular abnormalities.  She developed recurrent atrial fibrillation with mild chest pain.  She failed 2 attempts to cardioversion in the emergency room at 200 J.  She ultimately was seen  by Dr. Rayann Heman in consultation and although her QTc was mildly prolonged, he felt that it was stable.  As result sotalol was increased to 120 mg twice a day.  2 days later on 07/10/2017 she underwent successful cardioversion on the increased dose.  She has a maintaining normal rhythm since.  She hadn't atrial fibrillation clinic follow-up on 07/17/2017  and her ECG showed sinus rhythm at 60 bpm.  She has continued to be on sotalol 120 twice a day, metoprolol 25 mg twice a day, and eliquis 5 mg twice a day for cha2ds2score of at least 5.  Of note, upon further questioning, she has had issues recently where food gets stuck in her esophagus.  Remotely she had undergone esophageal dilatations in 2015.   When I saw her in June 2019  she denied any awareness of recurrent atrial fibrillation.  She has peripheral neuropathy of her feet.  She had undergone  laboratory by her primary physician which showed a cholesterol 229, triglycerides 340, VLDL 68, and direct LDL 150.  He had issues with numerous statins and during that evaluation I recommended a trial of Livalo and provided her with samples of 1 mg daily for 1 month with plans to titrate to 2 mg..  She subsequently was seen by [name] as well as [name] pharmacist and there was some discussion concerning PCSK9 inhibition.  However due to cost issues she preferred to continue to try the Livalo.  She has tolerated this well with improvement in her LVH but unfortunately the cost has been significant.She presents for follow-up evaluation.  Past Medical History:  Diagnosis Date   Abnormal nuclear stress test    mild anterolateral septal and inferior ischemia   Arthritis    Atherosclerosis of lower extremity with claudication (HCC) 04/17/2015   LEFT LEG   Atrial fibrillation (HCC)    Claudication (HCC)    Colon cancer (HCC)    Coronary atherosclerosis of unspecified type of vessel, native or graft    Esophageal stricture    Fatty liver 11/05/11   Hiatal hernia    HLD (hyperlipidemia)    Hypertension    Iron deficiency anemia, unspecified    Ischemic colitis ([place])    Neuropathy    Osteoporosis    PAF (paroxysmal atrial fibrillation) ([place]) 04/2015   Unspecified vascular insufficiency of intestine     Past Surgical History:  Procedure Laterality Date   abdominoperineal  resection anastomotic stricture  05/2007   APPENDECTOMY     CARDIAC CATHETERIZATION N/A 03/13/2015   Procedure: Left Heart Cath and Coronary Angiography;  Surgeon: Troy Sine, MD;  Location: [place] [facility];  Service: Cardiovascular;  Laterality: N/A;   CARDIOVERSION N/A 04/20/2015   Procedure: CARDIOVERSION;  Surgeon: Jerline Pain, MD;  Location: [place];  Service: Cardiovascular;  Laterality: N/A;   CARDIOVERSION N/A 07/10/2017   Procedure: CARDIOVERSION;  Surgeon: Jerline Pain, MD;  Location: [facility];  Service: Cardiovascular;  Laterality: N/A;   [place]   ESOPHAGOGASTRODUODENOSCOPY N/A 04/27/2014   Procedure: ESOPHAGOGASTRODUODENOSCOPY (EGD);  Surgeon: Lafayette Dragon, MD;  Location: [facility];  Service: Endoscopy;  Laterality: N/A;   Mandibular Renstruction     ROTATOR CUFF REPAIR  2006   left   Rt. Salpingo oophorectomy and cyst removal  2005   SAVORY DILATION N/A 04/27/2014   Procedure: SAVORY DILATION;  Surgeon: Lafayette Dragon, MD;  Location: [facility];  Service: Endoscopy;  Laterality: N/A;  no xray  needed   sigmoid resection for invasive rectal adenocarcinoma  12/2006   TEE WITHOUT CARDIOVERSION N/A 04/20/2015   Procedure: TRANSESOPHAGEAL ECHOCARDIOGRAM (TEE);  Surgeon: Jerline Pain, MD;  Location: [place];  Service: Cardiovascular;  Laterality: N/A;   TUBAL LIGATION     WRIST SURGERY  2008   right    Allergies  Allergen Reactions   Crestor [Rosuvastatin Calcium] Other (See Comments)    Muscle aches   Acetaminophen-Codeine Other (See Comments)    Unknown reaction - possibly nausea   Amlodipine Besylate Swelling    Hands and feet swelling   Hydrocodone-Acetaminophen Nausea And Vomiting   Irbesartan-Hydrochlorothiazide Other (See Comments)    REACTION: Dizziness   Lisinopril Cough   Omeprazole Nausea Only and Other (See Comments)    Dizziness, joint pain, weakness in legs, cramps in legs, stomach  burned   Propoxyphene N-Acetaminophen Other (See Comments)    Headache, and blotchy face   Valsartan Other (See Comments)    REACTION: blurred vision   Warfarin Sodium Other (See Comments)    REACTION: body aches and bleeding   Diltiazem Hcl Rash   Verapamil Nausea Only, Palpitations and Other (See Comments)    Headaches     Current Outpatient Medications  Medication Sig Dispense Refill   acetaminophen (TYLENOL) 500 MG tablet Take 1,000 mg by mouth every 6 (six) hours as needed for headache (pain).     Calcium Carbonate-Vitamin D (CALCIUM-D PO) Take 1 tablet by mouth at bedtime. Calcium 630 mg, Vitamin D3 500 units     chlorthalidone (HYGROTON) 25 MG tablet TAKE 1 TABLET (25 MG TOTAL) BY MOUTH DAILY. 90 tablet 0   Cyanocobalamin 2500 MCG TABS Take 2,500 mcg by mouth daily. Vitamin B12     Dietary Management Product (FOSTEUM PLUS) CAPS TAKE ONE CAPSULE BY MOUTH TWICE DAILY 180 capsule 1   famotidine (PEPCID) 40 MG tablet Take 1 tablet (40 mg total) by mouth at bedtime. 90 tablet 1   finasteride (PROPECIA) 1 MG tablet Take 1 tablet (1 mg total) by mouth at bedtime. 90 tablet 1   gabapentin (NEURONTIN) 100 MG capsule Take 1 capsule (100 mg total) by mouth See admin instructions. Take one capsule (100 mg) by mouth three times daily - breakfast, supper and bedtime 270 capsule 1   metoprolol tartrate (LOPRESSOR) 25 MG tablet Take 1 tablet (25 mg total) by mouth 2 (two) times daily. 180 tablet 1   omega-3 acid ethyl esters (LOVAZA) 1 g capsule Take 1 capsule (1 g total) by mouth 2 (two) times daily. 180 capsule 1   Pitavastatin Calcium (LIVALO) 2 MG TABS Take 1 tablet (2 mg total) by mouth daily. 90 tablet 1   potassium chloride (K-DUR) 10 MEQ tablet TAKE 2 TABLETS (20 MEQ TOTAL) BY MOUTH DAILY (DOSE INCREASED DUE TO HYPOKALEMIA) 180 tablet 0   rivaroxaban (XARELTO) 20 MG TABS tablet Take 1 tablet (20 mg total) by mouth daily with supper. 90 tablet 1   sotalol (BETAPACE) 120  MG tablet TAKE 1 TABLET EVERY 12 HOURS 180 tablet 0   No current facility-administered medications for this visit.     Social History   Socioeconomic History   Marital status: Widowed    Spouse name: Not on file   Number of children: 4   Years of education: 12th grade   Highest education level: Not on file  Occupational History   Occupation: retired    Fish farm manager: RETIRED  Social Designer, fashion/clothing strain: Not on  file   Food insecurity    Worry: Not on file    Inability: Not on file   Transportation needs    Medical: Not on file    Non-medical: Not on file  Tobacco Use   Smoking status: Never Smoker   Smokeless tobacco: Never Used  Substance and Sexual Activity   Alcohol use: No   Drug use: No   Sexual activity: Not Currently  Lifestyle   Physical activity    Days per week: Not on file    Minutes per session: Not on file   Stress: Not on file  Relationships   Social connections    Talks on phone: Not on file    Gets together: Not on file    Attends religious service: Not on file    Active member of club or organization: Not on file    Attends meetings of clubs or organizations: Not on file    Relationship status: Not on file   Intimate partner violence    Fear of current or ex partner: Not on file    Emotionally abused: Not on file    Physically abused: Not on file    Forced sexual activity: Not on file  Other Topics Concern   Not on file  Social History Narrative   Lives at home with fiance.   Right-handed.   No caffeine use.    Family History  Problem Relation Age of Onset   Colon cancer Father 85   Heart disease Father    Hypertension Father    Alzheimer's disease Mother    Thyroid disease Mother    Hypertension Sister    Thyroid disease Sister    Mitral valve prolapse Sister    Hypertension Son    Diabetes Son    Hypertension Son    Diabetes Son    Esophageal cancer Neg Hx    Rectal cancer Neg Hx     Stomach cancer Neg Hx    Social history  is notable that she is widowed. She has 3 children and 9 grandchildren. She remains active. There is no alcohol tobacco use.   ROS General: Negative; No fevers, chills, or night sweats; positive for fatigue and shortness of breath HEENT: Negative; No changes in vision or hearing, sinus congestion, difficulty swallowing Pulmonary: Negative; No cough, wheezing, hemoptysis Cardiovascular: Positive for recurrent AF, status post cardioversion Positive for left leg claudication GI: Positive for GERD; dysphagia GU: Negative; No dysuria, hematuria, or difficulty voiding Musculoskeletal: Negative; no myalgias, joint pain, or weakness Hematologic/Oncology: Negative; no easy bruising, bleeding Endocrine: Negative; no heat/cold intolerance; no diabetes Neuro: Positive for peripheral neuropathy Skin: Negative; No rashes or skin lesions Psychiatric: Negative; No behavioral problems, depression Sleep: Negative; No snoring, daytime sleepiness, hypersomnolence, bruxism, restless legs, hypnogognic hallucinations, no cataplexy Other comprehensive 14 point system review is negative.   PE Pulse (!) 59    Ht 5' 6"  (1.676 m)    Wt 203 lb (92.1 kg)    BMI 32.77 kg/m    Repeat blood pressure by me was 148/82, and 136/80   Wt Readings from Last 3 Encounters:  02/28/19 203 lb (92.1 kg)  09/29/18 204 lb (92.5 kg)  06/21/18 198 lb (89.8 kg)   General: Alert, oriented, no distress.  Skin: normal turgor, no rashes, warm and dry HEENT: Normocephalic, atraumatic. Pupils equal round and reactive to light; sclera anicteric; extraocular muscles intact; Fundi ** Nose without nasal septal hypertrophy Mouth/Parynx benign; Mallinpatti scale Neck: No JVD, no carotid  bruits; normal carotid upstroke Lungs: clear to ausculatation and percussion; no wheezing or rales Chest wall: without tenderness to palpitation Heart: PMI not displaced, RRR, s1 s2 normal, 1/6 systolic murmur,  no diastolic murmur, no rubs, gallops, thrills, or heaves Abdomen: soft, nontender; no hepatosplenomehaly, BS+; abdominal aorta nontender and not dilated by palpation. Back: no CVA tenderness Pulses 2+ Musculoskeletal: full range of motion, normal strength, no joint deformities Extremities: Mild right ankle swelling; no clubbing cyanosis, Homan's sign negative  Neurologic: grossly nonfocal; Cranial nerves grossly wnl Psychologic: Normal mood and affect  ECG (independently read by me): Sinus bradycardia at 52 bpm.  QTc interval 453 ms on sotalol;  no significant ST-T changes  June 2019 ECG (independently read by me): Sinus bradycardia 56 bpm.  QTc interval 474 ms.  December 2018 ECG (independently read by me): Normal sinus rhythm at 60 bpm.  QTc interval 454 ms.  No ST segment changes.  October 2018 ECG (independently read by me): Normal sinus rhythm at 62 bpm.  No significant ST-T changes.  No ectopy.  QTc interval 452 ms.  May 2018 ECG (independently read by me): Sinus bradycardia 59 bpm.  Low voltage.  Normal intervals.  No ST segment changes.  October 2017 ECG (independently read by me): Sinus bradycardia 57 bpm.  No ectopy.  Normal intervals.  November 2016 ECG (independently read by me): Sinus bradycardia 59 bpm.  No ectopy.  Normal intervals.  QTC 457 ms.  05/22/2015 ECG (independently read by me): Normal sinus rhythm at 60 bpm.  QTc interval 454 ms.  No significant ST segment changes.  June 2016 ECG (independently read by me): Normal sinus rhythm at 63 bpm.  Poor precordial R-wave progression.  No ectopy.  November 2015 ECG (independently read by me);  Normal sinus rhythm at 59 bpm.  QTc interval 415 ms.  No significant ST segment changes.  Prior November 2014ECG: Sinus rhythm at 63 beats per minute. Normal intervals.  LABS: BMP Latest Ref Rng & Units 02/28/2019 09/29/2018 06/14/2018  Glucose 65 - 99 mg/dL 98 100(H) 105(H)  BUN 8 - 27 mg/dL 18 23 12   Creatinine 0.57 - 1.00  mg/dL 0.94 0.82 1.03(H)  BUN/Creat Ratio 12 - 28 19 - -  Sodium 134 - 144 mmol/L 142 144 144  Potassium 3.5 - 5.2 mmol/L 3.9 3.9 4.1  Chloride 96 - 106 mmol/L 102 104 107  CO2 20 - 29 mmol/L 24 30 29   Calcium 8.7 - 10.3 mg/dL 10.0 10.2 9.7   Hepatic Function Latest Ref Rng & Units 02/28/2019 03/30/2018 12/28/2017  Total Protein 6.0 - 8.5 g/dL 6.7 6.9 7.2  Albumin 3.6 - 4.6 g/dL 4.4 4.3 4.2  AST 0 - 40 IU/L 21 21 19   ALT 0 - 32 IU/L 17 18 14   Alk Phosphatase 39 - 117 IU/L 46 50 48  Total Bilirubin 0.0 - 1.2 mg/dL 1.0 1.1 1.1  Bilirubin, Direct 0.00 - 0.40 mg/dL - 0.23 -   CBC Latest Ref Rng & Units 02/28/2019 06/14/2018 03/30/2018  WBC 3.4 - 10.8 x10E3/uL 4.5 5.9 5.0  Hemoglobin 11.1 - 15.9 g/dL 13.5 14.1 15.1  Hematocrit 34.0 - 46.6 % 42.7 45.1 47.1(H)  Platelets 150 - 450 x10E3/uL 177 211 179   Lab Results  Component Value Date   MCV 83 02/28/2019   MCV 96.2 06/14/2018   MCV 96 03/30/2018   Lab Results  Component Value Date   TSH 2.310 02/28/2019   Lab Results  Component Value Date   HGBA1C  5.5 12/18/2016   Lipid Panel     Component Value Date/Time   CHOL 168 02/28/2019 0952   TRIG 209 (H) 02/28/2019 0952   HDL 54 02/28/2019 0952   CHOLHDL 3.1 02/28/2019 0952   CHOLHDL 5 12/28/2017 1340   VLDL 68.0 (H) 12/28/2017 1340   LDLCALC 72 02/28/2019 0952   LDLDIRECT 150.0 12/28/2017 1340    RADIOLOGY: US Breast Right  07/04/2013   CLINICAL DATA:  Abnormal screening right mammogram.  EXAM: DIGITAL DIAGNOSTIC  right MAMMOGRAM  ULTRASOUND right BREAST  COMPARISON:  With prior exams.  ACR Breast Density Category b: There are scattered areas of fibroglandular density.  FINDINGS: Spot compression views of the lateral aspect of the right breast were performed. There is persistence of a 4 mm low-density nodule in the posterior 3rd of the breast. There are no malignant type microcalcifications.  On physical exam I do not palpate a mass in the right breast.  Ultrasound is performed,  showing there is a near anechoic lesion in the right breast at 7 o'clock 8 cm from the nipple measuring 3 x 2 x 3 mm.  IMPRESSION: Probable benign lesion in the right breast.  RECOMMENDATION: Short-term interval followup right mammogram and ultrasound in 6 months is recommended to document stability.  I have discussed the findings and recommendations with the patient. Results were also provided in writing at the conclusion of the visit. If applicable, a reminder letter will be sent to the patient regarding the next appointment.  BI-RADS CATEGORY  3: Probably benign finding(s) - short interval follow-up suggested.   Electronically Signed   By: Lillia Mountain M.D.   On: 07/04/2013 09:01   Mm [place] R  07/04/2013   CLINICAL DATA:  Abnormal screening right mammogram.  EXAM: DIGITAL DIAGNOSTIC  right MAMMOGRAM  ULTRASOUND right BREAST  COMPARISON:  With prior exams.  ACR Breast Density Category b: There are scattered areas of fibroglandular density.  FINDINGS: Spot compression views of the lateral aspect of the right breast were performed. There is persistence of a 4 mm low-density nodule in the posterior 3rd of the breast. There are no malignant type microcalcifications.  On physical exam I do not palpate a mass in the right breast.  Ultrasound is performed, showing there is a near anechoic lesion in the right breast at 7 o'clock 8 cm from the nipple measuring 3 x 2 x 3 mm.  IMPRESSION: Probable benign lesion in the right breast.  RECOMMENDATION: Short-term interval followup right mammogram and ultrasound in 6 months is recommended to document stability.  I have discussed the findings and recommendations with the patient. Results were also provided in writing at the conclusion of the visit. If applicable, a reminder letter will be sent to the patient regarding the next appointment.  BI-RADS CATEGORY  3: Probably benign finding(s) - short interval follow-up suggested.   Electronically Signed   By: Lillia Mountain  M.D.   On: 07/04/2013 09:01   IMPRESSION:  1. Paroxysmal atrial fibrillation (HCC)   2. Essential hypertension   3. Anticoagulation adequate   4. Dyslipidemia, goal LDL below 70   5. PVD (peripheral vascular disease) ([place])   6. Idiopathic peripheral neuropathy   7. Grade II diastolic dysfunction      ASSESSMENT AND PLAN: Ms. Tompkins is an 85 year old female who has a history of hypertension, paroxysmal atrial fibrillation,  hyperlipidemia and peripheral vascular disease. She  is on eliquis for anticoagulation and tolerating this well.  She developed  recurrent AF and underwent successful cardioversion during her emergency room evaluation on 01/15/2017.  She developed recurrent A. fib despite taking metoprolol and sotalol 80 mg twice a day.  She had 2 failed attempts in the emergency room in early November when she presented with AF with RVR.  Ultimately, her sotalol dose was increased to 120 twice a day and she was able to be successfully cardioverted.  She is unaware of any episodes of recurrent atrial fibrillation and continues to be on sotalol 120 mg every 12 hours.  Her QTc interval today is 453 ms.  She continues to be on anticoagulation with Xarelto 20 mg daily.  She is tolerating this well without bleeding.  Her blood pressure today is mildly increased based on new hypertensive guidelines although on repeat was 136/80.Marland Kitchen  She has been taking chlorthalidone 25 mg daily, metoprolol 25 mg twice a day.  She has noticed very mild ankle swelling.  We discussed sodium restriction and leg elevation.  She has peripheral vascular disease with previous documentation of ABI of 0.63 on the left and 1.0 on the right with occlusive disease in the proximal to mid left superficial femoral artery with monophasic flow and three-vessel runoff in the popliteal and anterior tibial arteries.  She has numerous statin intolerances.  She has been able to tolerate Livalo and had been taking 2 mg daily.  Unfortunately, when  the new year arrived her cost was over $400 and as result she is waiting for insurance for improved coverage is.  We will try to provide her with samples in the interim.  In the past we had discussed possible PCSK9 inhibition.  I am recommending repeat laboratory be obtained in the fasting state including a CMP, CBC, TSH and lipid studies.  I will contact her regarding the results.  I will see her in 6 months for reevaluation.   Time spent: 25 minutes  Troy Sine, MD, [facility]  03/02/2019 8:07 AM

## 2019-02-28 NOTE — Patient Instructions (Signed)
Medication Instructions:  The current medical regimen is effective;  continue present plan and medications.  If you need a refill on your cardiac medications before your next appointment, please call your pharmacy.   Lab work: CBC, CMET, TSH, LIPID today If you have labs (blood work) drawn today and your tests are completely normal, you will receive your results only by: Marland Kitchen MyChart Message (if you have MyChart) OR . A paper copy in the mail If you have any lab test that is abnormal or we need to change your treatment, we will call you to review the results.  Follow-Up: At [facility], you and your health needs are our priority.  As part of our continuing mission to provide you with exceptional heart care, we have created designated Provider Care Teams.  These Care Teams include your primary Cardiologist (physician) and Advanced Practice Providers (APPs -  Physician Assistants and Nurse Practitioners) who all work together to provide you with the care you need, when you need it. You will need a follow up appointment in 6 months.  Please call our office 2 months in advance to schedule this appointment.  You may see Dr.Kelly or one of the following Advanced Practice Providers on your designated Care Team: Almyra Deforest, [place] . Fabian Sharp, PA-C

## 2019-03-01 LAB — LIPID PANEL
Chol/HDL Ratio: 3.1 ratio (ref 0.0–4.4)
Cholesterol, Total: 168 mg/dL (ref 100–199)
HDL: 54 mg/dL (ref >39)
LDL Calculated: 72 mg/dL (ref 0–99)
Triglycerides: 209 mg/dL — ABNORMAL HIGH (ref 0–149)
VLDL Cholesterol Cal: 42 mg/dL — ABNORMAL HIGH (ref 5–40)

## 2019-03-01 LAB — CBC
Hematocrit: 42.7 % (ref 34.0–46.6)
Hemoglobin: 13.5 g/dL (ref 11.1–15.9)
MCH: 26.3 pg — ABNORMAL LOW (ref 26.6–33.0)
MCHC: 31.6 g/dL (ref 31.5–35.7)
MCV: 83 fL (ref 79–97)
Platelets: 177 10*3/uL (ref 150–450)
RBC: 5.13 x10E6/uL (ref 3.77–5.28)
RDW: 14.1 % (ref 11.7–15.4)
WBC: 4.5 10*3/uL (ref 3.4–10.8)

## 2019-03-01 LAB — COMPREHENSIVE METABOLIC PANEL
ALT: 17 IU/L (ref 0–32)
AST: 21 IU/L (ref 0–40)
Albumin/Globulin Ratio: 1.9 (ref 1.2–2.2)
Albumin: 4.4 g/dL (ref 3.6–4.6)
Alkaline Phosphatase: 46 IU/L (ref 39–117)
BUN/Creatinine Ratio: 19 (ref 12–28)
BUN: 18 mg/dL (ref 8–27)
Bilirubin Total: 1 mg/dL (ref 0.0–1.2)
CO2: 24 mmol/L (ref 20–29)
Calcium: 10 mg/dL (ref 8.7–10.3)
Chloride: 102 mmol/L (ref 96–106)
Creatinine, Ser: 0.94 mg/dL (ref 0.57–1.00)
GFR calc Af Amer: 64 mL/min/{1.73_m2} (ref >59)
GFR calc non Af Amer: 56 mL/min/{1.73_m2} — ABNORMAL LOW (ref >59)
Globulin, Total: 2.3 g/dL (ref 1.5–4.5)
Glucose: 98 mg/dL (ref 65–99)
Potassium: 3.9 mmol/L (ref 3.5–5.2)
Sodium: 142 mmol/L (ref 134–144)
Total Protein: 6.7 g/dL (ref 6.0–8.5)

## 2019-03-01 LAB — TSH: TSH: 2.31 u[IU]/mL (ref 0.450–4.500)

## 2019-03-02 ENCOUNTER — Encounter: Payer: Self-pay | Admitting: Cardiovascular Disease

## 2019-03-16 ENCOUNTER — Other Ambulatory Visit: Payer: Self-pay | Admitting: Internal Medicine

## 2019-03-16 DIAGNOSIS — I10 Essential (primary) hypertension: Secondary | ICD-10-CM

## 2019-03-16 DIAGNOSIS — L659 Nonscarring hair loss, unspecified: Secondary | ICD-10-CM

## 2019-03-16 DIAGNOSIS — I480 Paroxysmal atrial fibrillation: Secondary | ICD-10-CM

## 2019-04-04 ENCOUNTER — Ambulatory Visit (INDEPENDENT_AMBULATORY_CARE_PROVIDER_SITE_OTHER): Payer: Medicare HMO | Admitting: Internal Medicine

## 2019-04-04 ENCOUNTER — Other Ambulatory Visit: Payer: Self-pay

## 2019-04-04 ENCOUNTER — Encounter: Payer: Self-pay | Admitting: Internal Medicine

## 2019-04-04 VITALS — BP 154/84 | HR 62 | Temp 98.0°F | Ht 66.0 in | Wt 208.0 lb

## 2019-04-04 DIAGNOSIS — I480 Paroxysmal atrial fibrillation: Secondary | ICD-10-CM

## 2019-04-04 DIAGNOSIS — G320 Subacute combined degeneration of spinal cord in diseases classified elsewhere: Secondary | ICD-10-CM

## 2019-04-04 DIAGNOSIS — E781 Pure hyperglyceridemia: Secondary | ICD-10-CM | POA: Diagnosis not present

## 2019-04-04 DIAGNOSIS — K219 Gastro-esophageal reflux disease without esophagitis: Secondary | ICD-10-CM | POA: Diagnosis not present

## 2019-04-04 DIAGNOSIS — E785 Hyperlipidemia, unspecified: Secondary | ICD-10-CM

## 2019-04-04 DIAGNOSIS — L659 Nonscarring hair loss, unspecified: Secondary | ICD-10-CM | POA: Diagnosis not present

## 2019-04-04 DIAGNOSIS — E538 Deficiency of other specified B group vitamins: Secondary | ICD-10-CM

## 2019-04-04 DIAGNOSIS — I10 Essential (primary) hypertension: Secondary | ICD-10-CM

## 2019-04-04 MED ORDER — OMEGA-3-ACID ETHYL ESTERS 1 G PO CAPS: 1.0000 g | ORAL_CAPSULE | Freq: Two times a day (BID) | ORAL | 1 refills | Status: DC

## 2019-04-04 MED ORDER — RIVAROXABAN 20 MG PO TABS: 20.0000 mg | ORAL_TABLET | Freq: Every day | ORAL | 1 refills | Status: DC

## 2019-04-04 MED ORDER — GABAPENTIN 100 MG PO CAPS: 100.0000 mg | ORAL_CAPSULE | Freq: Four times a day (QID) | ORAL | 1 refills | Status: DC

## 2019-04-04 MED ORDER — SOTALOL HCL 120 MG PO TABS: 120.0000 mg | ORAL_TABLET | Freq: Two times a day (BID) | ORAL | 1 refills | Status: DC

## 2019-04-04 MED ORDER — METOPROLOL TARTRATE 25 MG PO TABS: 25.0000 mg | ORAL_TABLET | Freq: Two times a day (BID) | ORAL | 1 refills | Status: DC

## 2019-04-04 MED ORDER — LIVALO 2 MG PO TABS: 2.0000 mg | ORAL_TABLET | Freq: Every day | ORAL | 1 refills | Status: DC

## 2019-04-04 MED ORDER — GABAPENTIN 100 MG PO CAPS: 100.0000 mg | ORAL_CAPSULE | ORAL | 1 refills | Status: DC

## 2019-04-04 MED ORDER — FINASTERIDE 1 MG PO TABS: 1.0000 mg | ORAL_TABLET | Freq: Every day | ORAL | 1 refills | Status: DC

## 2019-04-04 MED ORDER — FAMOTIDINE 40 MG PO TABS: 40.0000 mg | ORAL_TABLET | Freq: Every day | ORAL | 1 refills | Status: DC

## 2019-04-04 MED ORDER — CHLORTHALIDONE 25 MG PO TABS: 25.0000 mg | ORAL_TABLET | Freq: Every day | ORAL | 1 refills | Status: DC

## 2019-04-04 NOTE — Patient Instructions (Signed)

## 2019-04-04 NOTE — Progress Notes (Signed)
Subjective:  Patient ID: [name], female    DOB: [birth year]  Age: 85 y.o. MRN: [id number]  CC: Hypertension, Hyperlipidemia, and Atrial Fibrillation   HPI [name] presents for f/up - She complains of worsening burning and tingling in her feet.  She has not recently been taking gabapentin because it has not been shipped from her PBM.  She otherwise feels well and offers no complaints.  She is active and denies any recent episodes of CP, DOE, palpitations, edema, or fatigue.  Outpatient Medications Prior to Visit  Medication Sig Dispense Refill  . acetaminophen (TYLENOL) 500 MG tablet Take 1,000 mg by mouth every 6 (six) hours as needed for headache (pain).    . Calcium Carbonate-Vitamin D (CALCIUM-D PO) Take 1 tablet by mouth at bedtime. Calcium 630 mg, Vitamin D3 500 units    . Cyanocobalamin 2500 MCG TABS Take 2,500 mcg by mouth daily. Vitamin B12    . Dietary Management Product (FOSTEUM PLUS) CAPS TAKE ONE CAPSULE BY MOUTH TWICE DAILY 180 capsule 1  . potassium chloride (K-DUR) 10 MEQ tablet TAKE 2 TABLETS (20 MEQ TOTAL) BY MOUTH DAILY (DOSE INCREASED DUE TO HYPOKALEMIA) 180 tablet 0  . chlorthalidone (HYGROTON) 25 MG tablet TAKE 1 TABLET (25 MG TOTAL) BY MOUTH DAILY. 90 tablet 0  . famotidine (PEPCID) 40 MG tablet Take 1 tablet (40 mg total) by mouth at bedtime. 90 tablet 1  . finasteride (PROPECIA) 1 MG tablet TAKE 1 TABLET AT BEDTIME 90 tablet 1  . gabapentin (NEURONTIN) 100 MG capsule Take 1 capsule (100 mg total) by mouth See admin instructions. Take one capsule (100 mg) by mouth three times daily - breakfast, supper and bedtime 270 capsule 1  . metoprolol tartrate (LOPRESSOR) 25 MG tablet TAKE 1 TABLET TWICE DAILY 180 tablet 0  . omega-3 acid ethyl esters (LOVAZA) 1 g capsule Take 1 capsule (1 g total) by mouth 2 (two) times daily. 180 capsule 1  . Pitavastatin Calcium (LIVALO) 2 MG TABS Take 1 tablet (2 mg total) by mouth daily. 90 tablet 1  . rivaroxaban (XARELTO)  20 MG TABS tablet Take 1 tablet (20 mg total) by mouth daily with supper. 90 tablet 1  . sotalol (BETAPACE) 120 MG tablet TAKE 1 TABLET EVERY 12 HOURS 180 tablet 0   No facility-administered medications prior to visit.     ROS Review of Systems  Constitutional: Positive for unexpected weight change (wt gain). Negative for diaphoresis and fatigue.  HENT: Negative.   Eyes: Negative for visual disturbance.  Respiratory: Negative for cough, chest tightness, shortness of breath and wheezing.   Cardiovascular: Negative for chest pain, palpitations and leg swelling.  Gastrointestinal: Negative for abdominal pain, constipation, diarrhea, nausea and vomiting.  Endocrine: Negative.   Genitourinary: Negative.  Negative for difficulty urinating and dysuria.  Musculoskeletal: Negative for arthralgias and myalgias.  Skin: Negative.  Negative for color change and pallor.  Neurological: Negative.  Negative for dizziness, weakness, light-headedness, numbness and headaches.  Hematological: Negative for adenopathy. Does not bruise/bleed easily.  Psychiatric/Behavioral: Negative.     Objective:  BP (!) 154/84 (BP Location: Left Arm, Patient Position: Sitting, Cuff Size: Normal)   Pulse 62   Temp 98 F (36.7 C) (Oral)   Ht 5\' 6"  (1.676 m)   Wt 208 lb (94.3 kg)   SpO2 97%   BMI 33.57 kg/m   BP Readings from Last 3 Encounters:  04/04/19 (!) 154/84  02/28/19 136/80  09/29/18 (!) 142/78  Wt Readings from Last 3 Encounters:  04/04/19 208 lb (94.3 kg)  02/28/19 203 lb (92.1 kg)  09/29/18 204 lb (92.5 kg)    Physical Exam Vitals signs reviewed.  Constitutional:      Appearance: She is obese.  HENT:     Nose: Nose normal.     Mouth/Throat:     Mouth: Mucous membranes are moist.     Pharynx: No oropharyngeal exudate.  Eyes:     General: No scleral icterus.    Conjunctiva/sclera: Conjunctivae normal.  Neck:     Musculoskeletal: Normal range of motion. No neck rigidity or muscular  tenderness.  Cardiovascular:     Rate and Rhythm: Normal rate and regular rhythm.     Heart sounds: No murmur.  Pulmonary:     Effort: Pulmonary effort is normal. No respiratory distress.     Breath sounds: No stridor. No wheezing, rhonchi or rales.  Abdominal:     General: Abdomen is flat. Bowel sounds are normal. There is no distension.     Palpations: There is no hepatomegaly, splenomegaly or mass.     Tenderness: There is no abdominal tenderness. There is no guarding.  Musculoskeletal: Normal range of motion.     Right lower leg: No edema.     Left lower leg: No edema.  Lymphadenopathy:     Cervical: No cervical adenopathy.  Skin:    General: Skin is warm and dry.     Coloration: Skin is not pale.  Neurological:     General: No focal deficit present.     Mental Status: She is alert and oriented to person, place, and time. Mental status is at baseline.  Psychiatric:        Mood and Affect: Mood normal.        Behavior: Behavior normal.     Lab Results  Component Value Date   WBC 4.5 02/28/2019   HGB 13.5 02/28/2019   HCT 42.7 02/28/2019   PLT 177 02/28/2019   GLUCOSE 98 02/28/2019   CHOL 168 02/28/2019   TRIG 209 (H) 02/28/2019   HDL 54 02/28/2019   LDLDIRECT 150.0 12/28/2017   LDLCALC 72 02/28/2019   ALT 17 02/28/2019   AST 21 02/28/2019   NA 142 02/28/2019   K 3.9 02/28/2019   CL 102 02/28/2019   CREATININE 0.94 02/28/2019   BUN 18 02/28/2019   CO2 24 02/28/2019   TSH 2.310 02/28/2019   INR 1.02 07/07/2017   HGBA1C 5.5 12/18/2016    No results found.  Assessment & Plan:   [name] was seen today for hypertension, hyperlipidemia and atrial fibrillation.  Diagnoses and all orders for this visit:  Essential hypertension- Her blood pressure remains mildly elevated but considering her age and comorbid illnesses it is adequately well controlled.  Will continue the current doses of metoprolol and chlorthalidone. -     metoprolol tartrate (LOPRESSOR) 25 MG  tablet; Take 1 tablet (25 mg total) by mouth 2 (two) times daily. -     chlorthalidone (HYGROTON) 25 MG tablet; Take 1 tablet (25 mg total) by mouth daily.  Neuromyelopathy due to vitamin B12 deficiency (HCC) -     Folate; Future -     Vitamin B12; Future -     Discontinue: gabapentin (NEURONTIN) 100 MG capsule; Take 1 capsule (100 mg total) by mouth See admin instructions. Take one capsule (100 mg) by mouth three times daily - breakfast, supper and bedtime -     Discontinue: gabapentin (NEURONTIN) 100  MG capsule; Take 1 capsule (100 mg total) by mouth See admin instructions. Take one capsule (100 mg) by mouth three times daily - breakfast, supper and bedtime -     gabapentin (NEURONTIN) 100 MG capsule; Take 1 capsule (100 mg total) by mouth 4 (four) times daily.  Paroxysmal atrial fibrillation ([place])- She is maintaining sinus rhythm.  Will continue the current dose of sotalol.  Will continue anticoagulation with the DOAC. -     sotalol (BETAPACE) 120 MG tablet; Take 1 tablet (120 mg total) by mouth every 12 (twelve) hours. -     rivaroxaban (XARELTO) 20 MG TABS tablet; Take 1 tablet (20 mg total) by mouth daily with supper. -     metoprolol tartrate (LOPRESSOR) 25 MG tablet; Take 1 tablet (25 mg total) by mouth 2 (two) times daily.  Dyslipidemia, goal LDL below 100 -     Pitavastatin Calcium (LIVALO) 2 MG TABS; Take 1 tablet (2 mg total) by mouth daily.  Frontal balding -     finasteride (PROPECIA) 1 MG tablet; Take 1 tablet (1 mg total) by mouth at bedtime.  Gastroesophageal reflux disease without esophagitis -     famotidine (PEPCID) 40 MG tablet; Take 1 tablet (40 mg total) by mouth at bedtime.  Pure hypertriglyceridemia -     omega-3 acid ethyl esters (LOVAZA) 1 g capsule; Take 1 capsule (1 g total) by mouth 2 (two) times daily.   I have discontinued Subrina Vecchiarelli. Boggio's gabapentin and gabapentin. I have also changed her sotalol, metoprolol tartrate, finasteride, and gabapentin.  Additionally, I am having her maintain her Calcium Carbonate-Vitamin D (CALCIUM-D PO), Cyanocobalamin, acetaminophen, Fosteum Plus, potassium chloride, rivaroxaban, Livalo, omega-3 acid ethyl esters, famotidine, and chlorthalidone.  Meds ordered this encounter  Medications  . DISCONTD: gabapentin (NEURONTIN) 100 MG capsule    Sig: Take 1 capsule (100 mg total) by mouth See admin instructions. Take one capsule (100 mg) by mouth three times daily - breakfast, supper and bedtime    Dispense:  90 capsule    Refill:  1  . sotalol (BETAPACE) 120 MG tablet    Sig: Take 1 tablet (120 mg total) by mouth every 12 (twelve) hours.    Dispense:  180 tablet    Refill:  1  . rivaroxaban (XARELTO) 20 MG TABS tablet    Sig: Take 1 tablet (20 mg total) by mouth daily with supper.    Dispense:  90 tablet    Refill:  1  . Pitavastatin Calcium (LIVALO) 2 MG TABS    Sig: Take 1 tablet (2 mg total) by mouth daily.    Dispense:  90 tablet    Refill:  1  . omega-3 acid ethyl esters (LOVAZA) 1 g capsule    Sig: Take 1 capsule (1 g total) by mouth 2 (two) times daily.    Dispense:  180 capsule    Refill:  1  . metoprolol tartrate (LOPRESSOR) 25 MG tablet    Sig: Take 1 tablet (25 mg total) by mouth 2 (two) times daily.    Dispense:  180 tablet    Refill:  1  . finasteride (PROPECIA) 1 MG tablet    Sig: Take 1 tablet (1 mg total) by mouth at bedtime.    Dispense:  90 tablet    Refill:  1  . famotidine (PEPCID) 40 MG tablet    Sig: Take 1 tablet (40 mg total) by mouth at bedtime.    Dispense:  90 tablet  Refill:  1  . chlorthalidone (HYGROTON) 25 MG tablet    Sig: Take 1 tablet (25 mg total) by mouth daily.    Dispense:  90 tablet    Refill:  1  . DISCONTD: gabapentin (NEURONTIN) 100 MG capsule    Sig: Take 1 capsule (100 mg total) by mouth See admin instructions. Take one capsule (100 mg) by mouth three times daily - breakfast, supper and bedtime    Dispense:  90 capsule    Refill:  1  . gabapentin  (NEURONTIN) 100 MG capsule    Sig: Take 1 capsule (100 mg total) by mouth 4 (four) times daily.    Dispense:  120 capsule    Refill:  1     Follow-up: Return in about 3 months (around 07/05/2019).  [name], MD

## 2019-04-13 ENCOUNTER — Telehealth: Payer: Self-pay | Admitting: Internal Medicine

## 2019-04-13 NOTE — Telephone Encounter (Signed)
Need to clarify directions for finasteride (PROPECIA) 1 MG tablet and the indication, Ref # [id number]. Please advise

## 2019-04-14 NOTE — Telephone Encounter (Signed)
Spoke to pharmacy and gave clarification for medication.

## 2019-04-20 ENCOUNTER — Other Ambulatory Visit: Payer: Self-pay

## 2019-04-20 ENCOUNTER — Emergency Department (HOSPITAL_COMMUNITY): Payer: Medicare HMO

## 2019-04-20 ENCOUNTER — Emergency Department (HOSPITAL_COMMUNITY)
Admission: EM | Admit: 2019-04-20 | Discharge: 2019-04-20 | Disposition: A | Discharge status: Home / Self Care | Payer: Medicare HMO | Attending: Emergency Medicine | Admitting: Emergency Medicine

## 2019-04-20 DIAGNOSIS — I10 Essential (primary) hypertension: Secondary | ICD-10-CM | POA: Insufficient documentation

## 2019-04-20 DIAGNOSIS — Z85038 Personal history of other malignant neoplasm of large intestine: Secondary | ICD-10-CM | POA: Diagnosis not present

## 2019-04-20 DIAGNOSIS — M25511 Pain in right shoulder: Secondary | ICD-10-CM | POA: Diagnosis not present

## 2019-04-20 DIAGNOSIS — I4891 Unspecified atrial fibrillation: Secondary | ICD-10-CM | POA: Diagnosis not present

## 2019-04-20 DIAGNOSIS — Z79899 Other long term (current) drug therapy: Secondary | ICD-10-CM | POA: Insufficient documentation

## 2019-04-20 DIAGNOSIS — Z7901 Long term (current) use of anticoagulants: Secondary | ICD-10-CM | POA: Diagnosis not present

## 2019-04-20 MED ORDER — METHOCARBAMOL 500 MG PO TABS: 500.0000 mg | ORAL_TABLET | Freq: Two times a day (BID) | ORAL | 0 refills | Status: DC | PRN

## 2019-04-20 MED ORDER — METHOCARBAMOL 500 MG PO TABS
500.0000 mg | ORAL_TABLET | Freq: Once | ORAL | Status: AC
  Administered 2019-04-20: 500 mg via ORAL
  Filled 2019-04-20: qty 1

## 2019-04-20 MED ORDER — TRAMADOL HCL 50 MG PO TABS
50.0000 mg | ORAL_TABLET | Freq: Once | ORAL | Status: AC
  Administered 2019-04-20: 50 mg via ORAL
  Filled 2019-04-20: qty 1

## 2019-04-20 MED ORDER — DICLOFENAC SODIUM 1 % TD GEL: 2.0000 g | Freq: Four times a day (QID) | TRANSDERMAL | 1 refills | Status: AC

## 2019-04-20 MED ORDER — TRAMADOL HCL 50 MG PO TABS: 50.0000 mg | ORAL_TABLET | Freq: Four times a day (QID) | ORAL | 0 refills | Status: DC | PRN

## 2019-04-20 NOTE — Discharge Instructions (Signed)
Alternate ice and heat to areas of injury 3-4 times per day to limit inflammation and spasm.  Avoid strenuous activity and heavy lifting.  We recommend consistent use of Voltaren gel in addition to Robaxin for muscle spasms.  You have been prescribed tramadol to take as needed for severe pain.  Do not drive or drink alcohol after taking this medication as it may make you drowsy and impair your judgment.  We recommend follow-up with your Orthopedist to ensure resolution of symptoms.  Have your blood pressure rechecked by your primary care doctor.  Return to the ED for any new or concerning symptoms.

## 2019-04-20 NOTE — ED Triage Notes (Signed)
Patient c/o right shoulder pain. States that she mowed the grass yesterday (riding mower) and thought that this was what the pain was from . States has old injury in same shoulder.

## 2019-04-20 NOTE — ED Provider Notes (Signed)
[facility] EMERGENCY DEPARTMENT Provider Note   CSN: [id number] Arrival date & time: 04/20/19  0126     History   Chief Complaint Chief Complaint  Patient presents with   Shoulder Pain    right    HPI [name] is a 85 y.o. female.     85 year old female with a history of atrial fibrillation (on chronic Xarelto), dyslipidemia, hypertension, arthritis presents to the emergency department for evaluation of right shoulder pain.  She has had ongoing right shoulder pain in the past, but reports acute worsening of pain in her right shoulder after mowing the lawn yesterday.  Pain radiates towards the neck and down the arm.  She took 2 Tylenol which usually helps her symptoms, but this provided her no relief.  Her pain is exacerbated by movement of the extremity.  She has not had any fall or direct trauma/injury to the shoulder.  Denies any numbness or paresthesias, chest pain, shortness of breath, back pain, fevers.  The patient has been seen by Dr. Noemi Chapel for management in the past.  The history is provided by the patient. No language interpreter was used.  Shoulder Pain   Past Medical History:  Diagnosis Date   Abnormal nuclear stress test    mild anterolateral septal and inferior ischemia   Arthritis    Atherosclerosis of lower extremity with claudication (HCC) 04/17/2015   LEFT LEG   Atrial fibrillation (HCC)    Claudication (HCC)    Colon cancer (HCC)    Coronary atherosclerosis of unspecified type of vessel, native or graft    Esophageal stricture    Fatty liver 11/05/11   Hiatal hernia    HLD (hyperlipidemia)    Hypertension    Iron deficiency anemia, unspecified    Ischemic colitis (HCC)    Neuropathy    Osteoporosis    PAF (paroxysmal atrial fibrillation) ([place]) 04/2015   Unspecified vascular insufficiency of intestine     Patient Active Problem List   Diagnosis Date Noted   Frontal balding 04/19/2018   Anticoagulated  07/08/2017   Neuromyelopathy due to vitamin B12 deficiency ([place]) 12/01/2016   GERD (gastroesophageal reflux disease) 02/25/2015   Obesity (BMI 30.0-34.9) 07/19/2014   Stricture and stenosis of esophagus 04/27/2014   Rectal cancer ([place]) 10/01/2011   Routine general medical examination at a health care facility 07/21/2011   Atrial fibrillation ([place]) 11/28/2010   HTN (hypertension) 11/28/2010   CROHN'S DISEASE, LARGE INTESTINE 01/09/2009   Dyslipidemia, goal LDL below 70 11/08/2007   MITRAL REGURGITATION, 0 (MILD) 11/08/2007   Osteoporosis 11/08/2007    Past Surgical History:  Procedure Laterality Date   abdominoperineal resection anastomotic stricture  05/2007   APPENDECTOMY     CARDIAC CATHETERIZATION N/A 03/13/2015   Procedure: Left Heart Cath and Coronary Angiography;  Surgeon: Troy Sine, MD;  Location: [facility];  Service: Cardiovascular;  Laterality: N/A;   CARDIOVERSION N/A 04/20/2015   Procedure: CARDIOVERSION;  Surgeon: Jerline Pain, MD;  Location: [facility];  Service: Cardiovascular;  Laterality: N/A;   CARDIOVERSION N/A 07/10/2017   Procedure: CARDIOVERSION;  Surgeon: Jerline Pain, MD;  Location: [facility];  Service: Cardiovascular;  Laterality: N/A;   [place]   ESOPHAGOGASTRODUODENOSCOPY N/A 04/27/2014   Procedure: ESOPHAGOGASTRODUODENOSCOPY (EGD);  Surgeon: Lafayette Dragon, MD;  Location: [facility];  Service: Endoscopy;  Laterality: N/A;   Mandibular Renstruction     ROTATOR CUFF REPAIR  2006   left  Rt. Salpingo oophorectomy and cyst removal  2005   SAVORY DILATION N/A 04/27/2014   Procedure: SAVORY DILATION;  Surgeon: Lafayette Dragon, MD;  Location: [facility];  Service: Endoscopy;  Laterality: N/A;  no xray needed   sigmoid resection for invasive rectal adenocarcinoma  12/2006   TEE WITHOUT CARDIOVERSION N/A 04/20/2015   Procedure: TRANSESOPHAGEAL ECHOCARDIOGRAM (TEE);  Surgeon: Jerline Pain,  MD;  [facility];  Service: Cardiovascular;  Laterality: N/A;   TUBAL LIGATION     WRIST SURGERY  2008   right     OB History   No obstetric history on file.      Home Medications    Prior to Admission medications   Medication Sig Start Date End Date Taking? Authorizing Provider  acetaminophen (TYLENOL) 500 MG tablet Take 1,000 mg by mouth every 6 (six) hours as needed for headache (pain).    [provider]  Calcium Carbonate-Vitamin D (CALCIUM-D PO) Take 1 tablet by mouth at bedtime. Calcium 630 mg, Vitamin D3 500 units    [provider]  chlorthalidone (HYGROTON) 25 MG tablet Take 1 tablet (25 mg total) by mouth daily. 04/04/19   Janith Lima, MD  Cyanocobalamin 2500 MCG TABS Take 2,500 mcg by mouth daily. Vitamin B12    [provider]  diclofenac sodium (VOLTAREN) 1 % GEL Apply 2 g topically 4 (four) times daily. 04/20/19   Antonietta Breach, PA-C  Dietary Management Product (FOSTEUM PLUS) CAPS TAKE ONE CAPSULE BY MOUTH TWICE DAILY 01/10/19   Janith Lima, MD  famotidine (PEPCID) 40 MG tablet Take 1 tablet (40 mg total) by mouth at bedtime. 04/04/19   Janith Lima, MD  finasteride (PROPECIA) 1 MG tablet Take 1 tablet (1 mg total) by mouth at bedtime. 04/04/19   Janith Lima, MD  gabapentin (NEURONTIN) 100 MG capsule Take 1 capsule (100 mg total) by mouth 4 (four) times daily. 04/04/19   Janith Lima, MD  methocarbamol (ROBAXIN) 500 MG tablet Take 1 tablet (500 mg total) by mouth every 12 (twelve) hours as needed for muscle spasms. 04/20/19   Antonietta Breach, PA-C  metoprolol tartrate (LOPRESSOR) 25 MG tablet Take 1 tablet (25 mg total) by mouth 2 (two) times daily. 04/04/19   Janith Lima, MD  omega-3 acid ethyl esters (LOVAZA) 1 g capsule Take 1 capsule (1 g total) by mouth 2 (two) times daily. 04/04/19   Janith Lima, MD  Pitavastatin Calcium (LIVALO) 2 MG TABS Take 1 tablet (2 mg total) by mouth daily. 04/04/19   Janith Lima, MD  potassium  chloride (K-DUR) 10 MEQ tablet TAKE 2 TABLETS (20 MEQ TOTAL) BY MOUTH DAILY (DOSE INCREASED DUE TO HYPOKALEMIA) 02/15/19   Troy Sine, MD  rivaroxaban (XARELTO) 20 MG TABS tablet Take 1 tablet (20 mg total) by mouth daily with supper. 04/04/19   Janith Lima, MD  sotalol (BETAPACE) 120 MG tablet Take 1 tablet (120 mg total) by mouth every 12 (twelve) hours. 04/04/19   Janith Lima, MD  traMADol (ULTRAM) 50 MG tablet Take 1 tablet (50 mg total) by mouth every 6 (six) hours as needed. 04/20/19   Antonietta Breach, PA-C    Family History Family History  Problem Relation Age of Onset   Colon cancer Father 34   Heart disease Father    Hypertension Father    Alzheimer's disease Mother    Thyroid disease Mother    Hypertension Sister    Thyroid disease  Sister    Mitral valve prolapse Sister    Hypertension Son    Diabetes Son    Hypertension Son    Diabetes Son    Esophageal cancer Neg Hx    Rectal cancer Neg Hx    Stomach cancer Neg Hx     Social History Social History   Tobacco Use   Smoking status: Never Smoker   Smokeless tobacco: Never Used  Substance Use Topics   Alcohol use: No   Drug use: No     Allergies   Crestor [rosuvastatin calcium], Acetaminophen-codeine, Amlodipine besylate, Hydrocodone-acetaminophen, Irbesartan-hydrochlorothiazide, Lisinopril, Omeprazole, Propoxyphene n-acetaminophen, Valsartan, Warfarin sodium, Diltiazem hcl, and Verapamil   Review of Systems Review of Systems Ten systems reviewed and are negative for acute change, except as noted in the HPI.    Physical Exam Updated Vital Signs BP (!) 198/83 (BP Location: Left Arm)    Pulse (!) 55    Temp 98 F (36.7 C) (Oral)    Resp (!) 24    SpO2 98%   Physical Exam Vitals signs and nursing note reviewed.  Constitutional:      General: She is not in acute distress.    Appearance: She is well-developed. She is not diaphoretic.     Comments: Nontoxic appearing and in NAD  HENT:       Head: Normocephalic and atraumatic.  Eyes:     General: No scleral icterus.    Conjunctiva/sclera: Conjunctivae normal.  Neck:     Musculoskeletal: Normal range of motion.  Cardiovascular:     Rate and Rhythm: Normal rate and regular rhythm.     Pulses: Normal pulses.     Comments: Distal radial pulse 2+ in the RUE Pulmonary:     Effort: Pulmonary effort is normal. No respiratory distress.     Comments: Respirations even and unlabored Musculoskeletal:        General: Tenderness present.     Comments: TTP to the anterior and posterior right shoulder joint.  Significant decreased range of motion with abduction of the right shoulder secondary to pain.  There is no bony deformity or crepitus.  No overlying erythema or heat to touch.  No lymphangitic streaking.  Skin:    General: Skin is warm and dry.     Coloration: Skin is not pale.     Findings: No erythema or rash.  Neurological:     Mental Status: She is alert and oriented to person, place, and time.     Comments: Grip strength 5/5 in the right hand.  Sensation to light touch intact in the R upper extremity.  Psychiatric:        Behavior: Behavior normal.      ED Treatments / Results  Labs (all labs ordered are listed, but only abnormal results are displayed) Labs Reviewed - No data to display  EKG None  Radiology Dg Shoulder Right  Result Date: 04/20/2019 CLINICAL DATA:  Right shoulder pain history of remote injury. EXAM: RIGHT SHOULDER - 2+ VIEW COMPARISON:  None. FINDINGS: Degenerative changes at the acromioclavicular and glenohumeral joints. More acute soft tissue thickening is present the level of the acromioclavicular joint with maintained acromioclavicular and coracoclavicular interval. Included portion of the right chest wall and right lung are free of abnormality. IMPRESSION: Degenerative changes at the acromioclavicular and glenohumeral joints. Mild soft tissue thickening focally at the acromioclavicular joint.  Could correlate for point tenderness to assess for a [place] type 1 acromioclavicular injury. No other acute abnormality. Electronically Signed  By: Lovena Le M.D.   On: 04/20/2019 02:20    Procedures Procedures (including critical care time)  Medications Ordered in ED Medications  traMADol (ULTRAM) tablet 50 mg (50 mg Oral Given 04/20/19 0436)  methocarbamol (ROBAXIN) tablet 500 mg (500 mg Oral Given 04/20/19 0436)     Initial Impression / Assessment and Plan / ED Course  I have reviewed the triage vital signs and the nursing notes.  Pertinent labs & imaging results that were available during my care of the patient were reviewed by me and considered in my medical decision making (see chart for details).        Patient presents to the emergency department for evaluation of acute on chronic R shoulder pain. Patient neurovascularly intact on exam. Imaging negative for fracture, dislocation, bony deformity. No erythema, heat to touch to the affected area; no concern for septic joint. Compartments in the affected extremity are soft. Plan for supportive management including RICE, Voltaren, Robaxin, Tramadol.  She has been instructed to follow up with Orthopedics. Return precautions discussed and provided. Patient discharged in stable condition with no unaddressed concerns.   Final Clinical Impressions(s) / ED Diagnoses   Final diagnoses:  Acute pain of right shoulder  Essential hypertension    ED Discharge Orders         Ordered    diclofenac sodium (VOLTAREN) 1 % GEL  4 times daily     04/20/19 0442    traMADol (ULTRAM) 50 MG tablet  Every 6 hours PRN     04/20/19 0442    methocarbamol (ROBAXIN) 500 MG tablet  Every 12 hours PRN     04/20/19 0442           Antonietta Breach, PA-C 04/20/19 [place], [place], DO 04/20/19 [phone]

## 2019-04-20 NOTE — ED Notes (Signed)
Ice Pack applied to pt right shoulder/neck.

## 2019-04-22 ENCOUNTER — Telehealth: Payer: Self-pay | Admitting: Internal Medicine

## 2019-04-22 NOTE — Telephone Encounter (Signed)
Spoke with [name] about rescheduling AWV. Patient declined at this time, but stated she will give the office a call back to r/s appt. SF

## 2019-05-03 DIAGNOSIS — M25511 Pain in right shoulder: Secondary | ICD-10-CM | POA: Diagnosis not present

## 2019-05-03 DIAGNOSIS — S46011A Strain of muscle(s) and tendon(s) of the rotator cuff of right shoulder, initial encounter: Secondary | ICD-10-CM | POA: Diagnosis not present

## 2019-05-16 ENCOUNTER — Telehealth: Payer: Self-pay | Admitting: Internal Medicine

## 2019-05-16 ENCOUNTER — Telehealth: Payer: Self-pay | Admitting: Cardiovascular Disease

## 2019-05-16 NOTE — Telephone Encounter (Signed)
Patient calling the office for samples of medication:   1.  What medication and dosage are you requesting samples for? Pitavastatin Calcium (LIVALO) 2 MG TABS rivaroxaban (XARELTO) 20 MG TABS tablet  2.  Are you currently out of this medication? She is in the donut hole.

## 2019-05-16 NOTE — Telephone Encounter (Signed)
Gave the pt Livalo 2mg  sample. Lot #: [id number] Exp Date: 09/2020

## 2019-05-16 NOTE — Telephone Encounter (Signed)
Pt is currently in the donut hole and wants to know if Dr. Ronnald Ramp has any of the medications below as a sample that she can get from the office/ please advise    Pitavastatin Calcium (LIVALO) 2 MG TABS  And  rivaroxaban (XARELTO) 20 MG TABS tablet

## 2019-05-19 ENCOUNTER — Other Ambulatory Visit: Payer: Self-pay | Admitting: Cardiovascular Disease

## 2019-05-19 NOTE — Telephone Encounter (Signed)
3 boxes of Livalo 2 mg and 1 bottle of Xarelto 10 mg are upfront for pt to p/u. Pt informed sample are at front desk and to take 2 of the Xarelto 10 mg. Patient verbalized understanding.

## 2019-05-31 ENCOUNTER — Other Ambulatory Visit: Payer: Self-pay | Admitting: Orthopaedic Surgery

## 2019-05-31 DIAGNOSIS — S46011D Strain of muscle(s) and tendon(s) of the rotator cuff of right shoulder, subsequent encounter: Secondary | ICD-10-CM | POA: Diagnosis not present

## 2019-05-31 DIAGNOSIS — G8929 Other chronic pain: Secondary | ICD-10-CM

## 2019-06-01 ENCOUNTER — Telehealth: Payer: Self-pay

## 2019-06-01 NOTE — Telephone Encounter (Signed)
Please comment on xarelto. 

## 2019-06-01 NOTE — Telephone Encounter (Signed)
Pt takes Xarelto for afib with CHADS2VASc score of 5 (age x2, sex, HTN, CAD/PAD). Renal function is normal. Ok to hold Xarelto for up to 3 days prior to procedure.

## 2019-06-01 NOTE — Telephone Encounter (Signed)
Request for surgical clearance:  1. What type of surgery is being performed? RIGHT SHOULDER REPLACEMENT   2. When is this surgery scheduled? TBD   3. What type of clearance is required (medical clearance vs. Pharmacy clearance to hold med vs. Both)?  BOTH  4. Are there any medications that need to be held prior to surgery and how long?PT TAKES XARELTO   5. Practice name and name of physician performing surgery? [facility] ATTN SHERRI   6. What is your office phone number [phone]    7.   What is your office fax number [phone]  8.   Anesthesia type (None, local, MAC, general) ? NOT LISTED

## 2019-06-01 NOTE — Telephone Encounter (Signed)
   Primary Cardiologist: No primary care provider on file.  Chart reviewed as part of pre-operative protocol coverage. Patient was contacted 06/01/2019 in reference to pre-operative risk assessment for pending surgery as outlined below.  [name] was last seen on 01/2019 by Dr. Claiborne Billings.  Since that day, [name] has done well.  She can complete 4.0 METS without anginal symptoms. She does not have a history of MI or stroke.   Per our clinical pharmacist: Pt takes Xarelto for afib with CHADS2VASc score of 5 (age x2, sex, HTN, CAD/PAD). Renal function is normal. Ok to hold Xarelto for up to 3 days prior to procedure  Therefore, based on ACC/AHA guidelines, the patient would be at acceptable risk for the planned procedure without further cardiovascular testing.   I will route this recommendation to the requesting party via Epic fax function and remove from pre-op pool.  Please call with questions.  Tami Lin Kolbie Lepkowski, PA 06/01/2019, 4:13 PM

## 2019-06-06 ENCOUNTER — Other Ambulatory Visit: Payer: Self-pay

## 2019-06-06 ENCOUNTER — Ambulatory Visit
Admission: RE | Admit: 2019-06-06 | Discharge: 2019-06-06 | Disposition: A | Discharge status: Home / Self Care | Payer: Medicare HMO | Source: Ambulatory Visit | Attending: Orthopaedic Surgery | Admitting: Orthopaedic Surgery

## 2019-06-06 DIAGNOSIS — M11211 Other chondrocalcinosis, right shoulder: Secondary | ICD-10-CM | POA: Diagnosis not present

## 2019-06-06 DIAGNOSIS — G8929 Other chronic pain: Secondary | ICD-10-CM

## 2019-06-06 DIAGNOSIS — M6289 Other specified disorders of muscle: Secondary | ICD-10-CM | POA: Diagnosis not present

## 2019-06-06 DIAGNOSIS — M19011 Primary osteoarthritis, right shoulder: Secondary | ICD-10-CM | POA: Diagnosis not present

## 2019-06-06 DIAGNOSIS — M25411 Effusion, right shoulder: Secondary | ICD-10-CM | POA: Diagnosis not present

## 2019-06-08 ENCOUNTER — Other Ambulatory Visit: Payer: Self-pay | Admitting: Internal Medicine

## 2019-06-08 ENCOUNTER — Telehealth: Payer: Self-pay | Admitting: Internal Medicine

## 2019-06-08 DIAGNOSIS — E538 Deficiency of other specified B group vitamins: Secondary | ICD-10-CM

## 2019-06-08 NOTE — Telephone Encounter (Signed)
Recv'd records from [facility] forwarded 3 pages to Dr. [name] 06/08/19 fbg.

## 2019-06-28 ENCOUNTER — Other Ambulatory Visit: Payer: Self-pay | Admitting: Cardiovascular Disease

## 2019-06-28 DIAGNOSIS — I480 Paroxysmal atrial fibrillation: Secondary | ICD-10-CM

## 2019-07-07 ENCOUNTER — Encounter: Payer: Self-pay | Admitting: Internal Medicine

## 2019-07-07 ENCOUNTER — Ambulatory Visit (INDEPENDENT_AMBULATORY_CARE_PROVIDER_SITE_OTHER): Payer: Medicare HMO | Admitting: Internal Medicine

## 2019-07-07 ENCOUNTER — Other Ambulatory Visit: Payer: Self-pay

## 2019-07-07 VITALS — BP 158/86 | HR 62 | Temp 98.0°F | Resp 16 | Ht 66.0 in | Wt 205.0 lb

## 2019-07-07 DIAGNOSIS — I10 Essential (primary) hypertension: Secondary | ICD-10-CM | POA: Diagnosis not present

## 2019-07-07 DIAGNOSIS — I480 Paroxysmal atrial fibrillation: Secondary | ICD-10-CM

## 2019-07-07 NOTE — Patient Instructions (Signed)

## 2019-07-07 NOTE — Progress Notes (Signed)
Subjective:  Patient ID: [name], female    DOB: [birth year]  Age: 85 y.o. MRN: [id number]  CC: Hypertension   HPI [name] presents for f/up - She tells me her blood pressure has recently been well controlled.  She has a chronic, unchanged level of shortness of breath.  She denies chest pain, DOE, diaphoresis, dizziness, or lightheadedness.  She refuses a flu shot today.  Outpatient Medications Prior to Visit  Medication Sig Dispense Refill   acetaminophen (TYLENOL) 500 MG tablet Take 1,000 mg by mouth every 6 (six) hours as needed for headache (pain).     Calcium Carbonate-Vitamin D (CALCIUM-D PO) Take 1 tablet by mouth at bedtime. Calcium 630 mg, Vitamin D3 500 units     chlorthalidone (HYGROTON) 25 MG tablet Take 1 tablet (25 mg total) by mouth daily. 90 tablet 1   Cyanocobalamin 2500 MCG TABS Take 2,500 mcg by mouth daily. Vitamin B12     diclofenac sodium (VOLTAREN) 1 % GEL Apply 2 g topically 4 (four) times daily. 100 g 1   Dietary Management Product (FOSTEUM PLUS) CAPS TAKE ONE CAPSULE BY MOUTH TWICE DAILY 180 capsule 1   famotidine (PEPCID) 40 MG tablet Take 1 tablet (40 mg total) by mouth at bedtime. 90 tablet 1   finasteride (PROPECIA) 1 MG tablet Take 1 tablet (1 mg total) by mouth at bedtime. 90 tablet 1   gabapentin (NEURONTIN) 100 MG capsule TAKE 1 CAPSULE THREE TIMES DAILY, AT BREAKFAST,  SUPPER AND BEDTIME 270 capsule 1   methocarbamol (ROBAXIN) 500 MG tablet Take 1 tablet (500 mg total) by mouth every 12 (twelve) hours as needed for muscle spasms. 20 tablet 0   metoprolol tartrate (LOPRESSOR) 25 MG tablet Take 1 tablet (25 mg total) by mouth 2 (two) times daily. 180 tablet 1   omega-3 acid ethyl esters (LOVAZA) 1 g capsule Take 1 capsule (1 g total) by mouth 2 (two) times daily. 180 capsule 1   Pitavastatin Calcium (LIVALO) 2 MG TABS Take 1 tablet (2 mg total) by mouth daily. 90 tablet 1   potassium chloride (K-DUR) 10 MEQ tablet TAKE 2 TABLETS  (20 MEQ TOTAL) BY MOUTH DAILY (DOSE INCREASED DUE TO HYPOKALEMIA) 180 tablet 3   sotalol (BETAPACE) 120 MG tablet Take 1 tablet (120 mg total) by mouth every 12 (twelve) hours. 180 tablet 1   traMADol (ULTRAM) 50 MG tablet Take 1 tablet (50 mg total) by mouth every 6 (six) hours as needed. 15 tablet 0   XARELTO 20 MG TABS tablet TAKE 1 TABLET BY MOUTH DAILY WITH SUPPER 90 tablet 1   No facility-administered medications prior to visit.     ROS Review of Systems  Constitutional: Negative.  Negative for diaphoresis, fatigue and unexpected weight change.  HENT: Negative.   Eyes: Negative for visual disturbance.  Respiratory: Positive for shortness of breath. Negative for cough, chest tightness and wheezing.   Cardiovascular: Negative for chest pain, palpitations and leg swelling.  Gastrointestinal: Negative for abdominal pain, blood in stool, diarrhea, nausea and vomiting.  Endocrine: Negative.   Genitourinary: Negative.  Negative for difficulty urinating.  Musculoskeletal: Positive for arthralgias. Negative for myalgias.       She has chronic right shoulder pain after a fall.  She tells me she is having right shoulder surgery soon.  Skin: Negative.  Negative for pallor.  Neurological: Negative.  Negative for dizziness, weakness and light-headedness.  Hematological: Negative for adenopathy. Does not bruise/bleed easily.  Psychiatric/Behavioral: Negative.  Objective:  BP (!) 158/86 (BP Location: Left Arm, Patient Position: Sitting, Cuff Size: Large)    Pulse 62    Temp 98 F (36.7 C) (Oral)    Resp 16    Ht 5\' 6"  (1.676 m)    Wt 205 lb (93 kg)    SpO2 96%    BMI 33.09 kg/m   BP Readings from Last 3 Encounters:  07/07/19 (!) 158/86  04/20/19 (!) 198/83  04/04/19 (!) 154/84    Wt Readings from Last 3 Encounters:  07/07/19 205 lb (93 kg)  04/04/19 208 lb (94.3 kg)  02/28/19 203 lb (92.1 kg)    Physical Exam Vitals signs reviewed.  Constitutional:      Appearance: Normal  appearance.  HENT:     Nose: Nose normal.     Mouth/Throat:     Mouth: Mucous membranes are moist.  Eyes:     General: No scleral icterus.    Conjunctiva/sclera: Conjunctivae normal.  Neck:     Musculoskeletal: Neck supple.  Cardiovascular:     Rate and Rhythm: Normal rate and regular rhythm.     Heart sounds: No murmur.  Pulmonary:     Effort: Pulmonary effort is normal.     Breath sounds: No wheezing or rales.  Abdominal:     General: Abdomen is flat. Bowel sounds are normal.     Palpations: Abdomen is soft. There is no hepatomegaly, splenomegaly or mass.     Tenderness: There is no abdominal tenderness.  Musculoskeletal: Normal range of motion.     Right lower leg: No edema.     Left lower leg: No edema.  Lymphadenopathy:     Cervical: No cervical adenopathy.  Skin:    General: Skin is warm.  Neurological:     General: No focal deficit present.     Mental Status: She is alert.     Lab Results  Component Value Date   WBC 4.5 02/28/2019   HGB 13.5 02/28/2019   HCT 42.7 02/28/2019   PLT 177 02/28/2019   GLUCOSE 98 02/28/2019   CHOL 168 02/28/2019   TRIG 209 (H) 02/28/2019   HDL 54 02/28/2019   LDLDIRECT 150.0 12/28/2017   LDLCALC 72 02/28/2019   ALT 17 02/28/2019   AST 21 02/28/2019   NA 142 02/28/2019   K 3.9 02/28/2019   CL 102 02/28/2019   CREATININE 0.94 02/28/2019   BUN 18 02/28/2019   CO2 24 02/28/2019   TSH 2.310 02/28/2019   INR 1.02 07/07/2017   HGBA1C 5.5 12/18/2016    Ct Shoulder Right Wo Contrast  Result Date: 06/06/2019 CLINICAL DATA:  Chronic right shoulder pain. EXAM: CT OF THE UPPER RIGHT EXTREMITY WITHOUT CONTRAST TECHNIQUE: Multidetector CT imaging of the upper right extremity was performed according to the standard protocol. COMPARISON:  Right shoulder x-rays dated April 20, 2019. MRI right shoulder dated September 18, 2014. FINDINGS: Bones/Joint/Cartilage No fracture or dislocation. Normal alignment. Small joint effusion. Mild  osteoarthritis of the glenohumeral joint with mild joint space narrowing and marginal osteophytosis. Chondrocalcinosis. Moderate arthropathy of the acromioclavicular joint. Type II acromion. Ligaments Ligaments are suboptimally evaluated by CT. Muscles and Tendons Moderate infraspinatus and mild supraspinatus muscle atrophy. Peritendinous calcification along the subscapularis myotendinous junction. Soft tissue No fluid collection or hematoma.  No soft tissue mass. IMPRESSION: 1. Mild glenohumeral osteoarthritis. Chondrocalcinosis could reflect underlying CPPD arthropathy. 2. Moderate infraspinatus and mild supraspinatus muscle atrophy related to underlying full-thickness tendon tears seen on prior MRI from January 2016.  3. Moderate acromioclavicular osteoarthritis. Electronically Signed   By: Titus Dubin M.D.   On: 06/06/2019 19:43    Assessment & Plan:   Iraima was seen today for hypertension.  Diagnoses and all orders for this visit:  Essential hypertension- Her blood pressure is running a little high but she is not willing to add another medication and at her age I do not think tight blood pressure control is indicated.  Will continue the current regimen.  Her recent electrolytes and renal function were normal.  Paroxysmal atrial fibrillation ([place])- She is maintaining sinus rhythm.  Will continue anticoagulation with the DOAC.   I am having [name] maintain her Calcium Carbonate-Vitamin D (CALCIUM-D PO), Cyanocobalamin, acetaminophen, Fosteum Plus, sotalol, Livalo, omega-3 acid ethyl esters, metoprolol tartrate, finasteride, famotidine, chlorthalidone, diclofenac sodium, traMADol, methocarbamol, potassium chloride, gabapentin, and Xarelto.  No orders of the defined types were placed in this encounter.    Follow-up: Return in about 6 months (around 01/04/2020).  [name], MD

## 2019-07-14 ENCOUNTER — Other Ambulatory Visit: Payer: Self-pay | Admitting: Internal Medicine

## 2019-09-05 ENCOUNTER — Ambulatory Visit: Payer: Medicare HMO | Admitting: Cardiovascular Disease

## 2019-09-07 ENCOUNTER — Other Ambulatory Visit: Payer: Self-pay | Admitting: Internal Medicine

## 2019-09-07 DIAGNOSIS — K219 Gastro-esophageal reflux disease without esophagitis: Secondary | ICD-10-CM

## 2019-09-14 ENCOUNTER — Encounter (INDEPENDENT_AMBULATORY_CARE_PROVIDER_SITE_OTHER): Payer: Self-pay

## 2019-09-14 ENCOUNTER — Ambulatory Visit: Payer: Medicare HMO | Admitting: Cardiovascular Disease

## 2019-09-14 ENCOUNTER — Encounter: Payer: Self-pay | Admitting: Cardiovascular Disease

## 2019-09-14 ENCOUNTER — Other Ambulatory Visit: Payer: Self-pay

## 2019-09-14 DIAGNOSIS — I519 Heart disease, unspecified: Secondary | ICD-10-CM | POA: Diagnosis not present

## 2019-09-14 DIAGNOSIS — M25471 Effusion, right ankle: Secondary | ICD-10-CM

## 2019-09-14 DIAGNOSIS — I739 Peripheral vascular disease, unspecified: Secondary | ICD-10-CM

## 2019-09-14 DIAGNOSIS — E785 Hyperlipidemia, unspecified: Secondary | ICD-10-CM

## 2019-09-14 DIAGNOSIS — Z79899 Other long term (current) drug therapy: Secondary | ICD-10-CM | POA: Diagnosis not present

## 2019-09-14 DIAGNOSIS — I10 Essential (primary) hypertension: Secondary | ICD-10-CM

## 2019-09-14 DIAGNOSIS — I480 Paroxysmal atrial fibrillation: Secondary | ICD-10-CM

## 2019-09-14 DIAGNOSIS — I5189 Other ill-defined heart diseases: Secondary | ICD-10-CM

## 2019-09-14 LAB — COMPREHENSIVE METABOLIC PANEL
ALT: 15 IU/L (ref 0–32)
AST: 21 IU/L (ref 0–40)
Albumin/Globulin Ratio: 1.8 (ref 1.2–2.2)
Albumin: 4.4 g/dL (ref 3.6–4.6)
Alkaline Phosphatase: 56 IU/L (ref 39–117)
BUN/Creatinine Ratio: 22 (ref 12–28)
BUN: 19 mg/dL (ref 8–27)
Bilirubin Total: 0.9 mg/dL (ref 0.0–1.2)
CO2: 27 mmol/L (ref 20–29)
Calcium: 9.8 mg/dL (ref 8.7–10.3)
Chloride: 97 mmol/L (ref 96–106)
Creatinine, Ser: 0.87 mg/dL (ref 0.57–1.00)
GFR calc Af Amer: 71 mL/min/{1.73_m2} (ref >59)
GFR calc non Af Amer: 61 mL/min/{1.73_m2} (ref >59)
Globulin, Total: 2.4 g/dL (ref 1.5–4.5)
Glucose: 106 mg/dL — ABNORMAL HIGH (ref 65–99)
Potassium: 3.9 mmol/L (ref 3.5–5.2)
Sodium: 139 mmol/L (ref 134–144)
Total Protein: 6.8 g/dL (ref 6.0–8.5)

## 2019-09-14 LAB — CBC
Hematocrit: 41.9 % (ref 34.0–46.6)
Hemoglobin: 13.4 g/dL (ref 11.1–15.9)
MCH: 28.3 pg (ref 26.6–33.0)
MCHC: 32 g/dL (ref 31.5–35.7)
MCV: 88 fL (ref 79–97)
Platelets: 193 10*3/uL (ref 150–450)
RBC: 4.74 x10E6/uL (ref 3.77–5.28)
RDW: 13.6 % (ref 11.7–15.4)
WBC: 4.7 10*3/uL (ref 3.4–10.8)

## 2019-09-14 LAB — LIPID PANEL
Chol/HDL Ratio: 3.4 ratio (ref 0.0–4.4)
Cholesterol, Total: 184 mg/dL (ref 100–199)
HDL: 54 mg/dL (ref >39)
LDL Chol Calc (NIH): 99 mg/dL (ref 0–99)
Triglycerides: 183 mg/dL — ABNORMAL HIGH (ref 0–149)
VLDL Cholesterol Cal: 31 mg/dL (ref 5–40)

## 2019-09-14 LAB — TSH: TSH: 2.35 u[IU]/mL (ref 0.450–4.500)

## 2019-09-14 MED ORDER — METOPROLOL TARTRATE 25 MG PO TABS: ORAL_TABLET | ORAL | 3 refills | Status: DC

## 2019-09-14 MED ORDER — SOTALOL HCL 120 MG PO TABS: 120.0000 mg | ORAL_TABLET | Freq: Two times a day (BID) | ORAL | 3 refills | Status: DC

## 2019-09-14 NOTE — Progress Notes (Signed)
Patient ID: [name], female   DOB: 01-09-1933, 86 y.o.   MRN: [id number]    PCP: Dr. Eilleen Kempf  HPI: [name] is a 86 y.o. female who presents for a 6 month cardiology evaluation.  [name] has a history of paroxysmal atrial fibrillation, hypertension, as well as hyperlipidemia. Remotely, she developed myalgias with simvastatin and had not been willing to try additional lipid lowering therapy. In August 2012 an echo Doppler study showed mild asymmetric LVH with proximal septal thickening without LVOT obstruction. She had grade 1 diastolic dysfunction with normal systolic function, mild MR, mild TR, and aortic valve sclerosis with mild MR.   In December 2014 follow-up blood work showed normal renal function with a BUN of 19, creatinine 0.8.  She continued to have significant hyperlipidemia with a total cholesterol of 250, triglycerides 221 HDL 53, VLDL 44 and LDL cholesterol 153.  TSH was normal at 2.275.   [name]  had noticed development of claudication, particularly involving her left leg with activity.  She had episodes of shortness of breath and fatigue.  She  underwent a lower extremity arterial Doppler study which was abnormal and showed an ABI of 0.63 on the left and 1.0 on the right.  The left common iliac, external iliac, common femoral artery and profunda demonstrated multiphasic flow.  However, the superficial femoral artery on the left demonstrated occlusive disease in the proximal to mid thigh with reconstitution of flow in the mid distal segment.  The popliteal artery demonstrated patent monophasic flow with three-vessel runoff.  The anterior tibial artery demonstrated focal stenosis in the proximal calf with dampened flow distal to this.  She presented to [facility] in August 2016 with complaints of palpitations, shortness of breath and presyncope.  She was noted to be in atrial fibrillation with rapid ventricular response.  Her heart rate was initially attempted to  be controlled with metoprolol and digitoxin, which was not successful and ultimately sotalol was started.  On 04/20/2015 she underwent successful TEE guided cardioversion.  She was continued on metoprolol as well as eliquis for anticoagulation.  An echo Doppler study on 04/18/2015 showed an EF of 55-60% with mild LVH.  She has multiple allergies and in the past did not tolerate ACE-inhibition or ARB therapy and was able to tolerate direct renin inhibition.  She has a history of GERD and has been taking ranitidine and sucralfate.  She has a history of hyperlipidemia and has had issues with statins.  When I saw her in October 2017 she was maintaining sinus rhythm.  She  presented to the emergency room on 01/15/2017 with chest tightness, shortness of breath and palpitations.  She was found to be back in atrial fibrillation.  She has continued to take anticoagulation with eloquence 5 mg twice a day.  During that evaluation, she underwent successful cardioversion and sinus rhythm was restored with 200 J cardioversion.   When I last saw her in May 2018, she was maintaining sinus rhythm.  I discussed the possibility of sleep apnea in the etiology of her recurrent AF but she did not want to pursue a sleep evaluation.  She underwent a follow-up echo Doppler study on 02/06/2017 which showed an EF of 60-65%.  There was moderate LVH, grade 2 diastolic dysfunction, and she was without major valvular abnormalities.  She developed recurrent atrial fibrillation with mild chest pain.  She failed 2 attempts to cardioversion in the emergency room at 200 J.  She ultimately was seen  by Dr. Rayann Heman in consultation and although her QTc was mildly prolonged, he felt that it was stable.  As result sotalol was increased to 120 mg twice a day.  2 days later on 07/10/2017 she underwent successful cardioversion on the increased dose.  She has a maintaining normal rhythm since.  She hadn't atrial fibrillation clinic follow-up on 07/17/2017  and her ECG showed sinus rhythm at 60 bpm.  She has continued to be on sotalol 120 twice a day, metoprolol 25 mg twice a day, and eliquis 5 mg twice a day for cha2ds2score of at least 5.  Of note, upon further questioning, she has had issues recently where food gets stuck in her esophagus.  Remotely she had undergone esophageal dilatations in 2015.   When I saw her in June 2019  she denied any awareness of recurrent atrial fibrillation.  She has peripheral neuropathy of her feet.  She had undergone  laboratory by her primary physician which showed a cholesterol 229, triglycerides 340, VLDL 68, and direct LDL 150.  He had issues with numerous statins and during that evaluation I recommended a trial of Livalo and provided her with samples of 1 mg daily for 1 month with plans to titrate to 2 mg..  She subsequently was seen by [name] as well as [name] pharmacist and there was some discussion concerning PCSK9 inhibition.  However due to cost issues she preferred to continue to try the Livalo.  She has tolerated this well with improvement in her LVH but unfortunately the cost has been significant.  I last saw her in June 2020.  At that time she was taking Livalo 2 mg daily and lipid studies were significantly improved with total cholesterol 168, LDL 72, HDL 54.    Since her last evaluation, she is remained stable from a cardiac standpoint.  She is unaware of any recurrent episodes of atrial fibrillation.  He presented to the emergency room in August 2020 with right shoulder pain.  She had previously experienced chronic issues with the pain had exacerbated.  She was found to have several tears.  There was some discussion concerning scheduling her for right shoulder surgery with Dr. Griffin Basil.  However over the past several months, her shoulder discomfort has improved and surgery is therefore not scheduled.  When she presented to the emergency room, her blood pressure was significantly elevated at 198/83.  She  had seen her primary physician and November 2020 and at that time her blood pressure was elevated at 158/86.  At times she does note some mild ankle swelling.  She presents for follow-up cardiology evaluation.  Past Medical History:  Diagnosis Date  . Abnormal nuclear stress test    mild anterolateral septal and inferior ischemia  . Arthritis   . Atherosclerosis of lower extremity with claudication ([place]) 04/17/2015   LEFT LEG  . Atrial fibrillation ([place])   . Claudication ([place])   . Colon cancer ([place])   . Coronary atherosclerosis of unspecified type of vessel, native or graft   . Esophageal stricture   . Fatty liver 11/05/11  . Hiatal hernia   . HLD (hyperlipidemia)   . Hypertension   . Iron deficiency anemia, unspecified   . Ischemic colitis ([place])   . Neuropathy   . Osteoporosis   . PAF (paroxysmal atrial fibrillation) ([place]) 04/2015  . Unspecified vascular insufficiency of intestine     Past Surgical History:  Procedure Laterality Date  . abdominoperineal resection anastomotic stricture  05/2007  . APPENDECTOMY    .  CARDIAC CATHETERIZATION N/A 03/13/2015   Procedure: Left Heart Cath and Coronary Angiography;  Surgeon: [place] Sine, MD;  Location: [facility];  Service: Cardiovascular;  Laterality: N/A;  . CARDIOVERSION N/A 04/20/2015   Procedure: CARDIOVERSION;  Surgeon: Jerline Pain, MD;  Location: [facility];  Service: Cardiovascular;  Laterality: N/A;  . CARDIOVERSION N/A 07/10/2017   Procedure: CARDIOVERSION;  Surgeon: Jerline Pain, MD;  Location: [facility];  Service: Cardiovascular;  Laterality: N/A;  . [place]  . ESOPHAGOGASTRODUODENOSCOPY N/A 04/27/2014   Procedure: ESOPHAGOGASTRODUODENOSCOPY (EGD);  Surgeon: Lafayette Dragon, MD;  Location: [facility];  Service: Endoscopy;  Laterality: N/A;  . Mandibular Renstruction    . ROTATOR CUFF REPAIR  2006   left  . Rt. Salpingo oophorectomy and cyst removal  2005  . SAVORY DILATION N/A  04/27/2014   Procedure: SAVORY DILATION;  Surgeon: Lafayette Dragon, MD;  Location: [facility];  Service: Endoscopy;  Laterality: N/A;  no xray needed  . sigmoid resection for invasive rectal adenocarcinoma  12/2006  . TEE WITHOUT CARDIOVERSION N/A 04/20/2015   Procedure: TRANSESOPHAGEAL ECHOCARDIOGRAM (TEE);  Surgeon: Jerline Pain, MD;  Location: [facility];  Service: Cardiovascular;  Laterality: N/A;  . TUBAL LIGATION    . WRIST SURGERY  2008   right    Allergies  Allergen Reactions  . Crestor [Rosuvastatin Calcium] Other (See Comments)    Muscle aches  . Acetaminophen-Codeine Other (See Comments)    Unknown reaction - possibly nausea  . Amlodipine Besylate Swelling    Hands and feet swelling  . Hydrocodone-Acetaminophen Nausea And Vomiting  . Irbesartan-Hydrochlorothiazide Other (See Comments)    REACTION: Dizziness  . Lisinopril Cough  . Omeprazole Nausea Only and Other (See Comments)    Dizziness, joint pain, weakness in legs, cramps in legs, stomach burned  . Propoxyphene N-Acetaminophen Other (See Comments)    Headache, and blotchy face  . Valsartan Other (See Comments)    REACTION: blurred vision  . Warfarin Sodium Other (See Comments)    REACTION: body aches and bleeding  . Diltiazem Hcl Rash  . Verapamil Nausea Only, Palpitations and Other (See Comments)    Headaches     Current Outpatient Medications  Medication Sig Dispense Refill  . acetaminophen (TYLENOL) 500 MG tablet Take 1,000 mg by mouth every 6 (six) hours as needed for headache (pain).    . Calcium Carbonate-Vitamin D (CALCIUM-D PO) Take 1 tablet by mouth at bedtime. Calcium 630 mg, Vitamin D3 500 units    . chlorthalidone (HYGROTON) 25 MG tablet Take 1 tablet (25 mg total) by mouth daily. 90 tablet 1  . Cyanocobalamin 2500 MCG TABS Take 2,500 mcg by mouth daily. Vitamin B12    . diclofenac sodium (VOLTAREN) 1 % GEL Apply 2 g topically 4 (four) times daily. 100 g 1  . Dietary Management Product (FOSTEUM  PLUS) CAPS TAKE ONE CAPSULE BY MOUTH TWICE DAILY 180 capsule 1  . famotidine (PEPCID) 40 MG tablet TAKE 1 TABLET (40 MG TOTAL) BY MOUTH AT BEDTIME. 90 tablet 1  . finasteride (PROPECIA) 1 MG tablet Take 1 tablet (1 mg total) by mouth at bedtime. 90 tablet 1  . gabapentin (NEURONTIN) 100 MG capsule TAKE 1 CAPSULE THREE TIMES DAILY, AT BREAKFAST,  SUPPER AND BEDTIME 270 capsule 1  . metoprolol tartrate (LOPRESSOR) 25 MG tablet Take 1 1/2(37.5 mg) tablet in the morning and 1(25 mg) tablet at bedtime 225 tablet 3  . Pitavastatin Calcium (  LIVALO) 2 MG TABS Take 1 tablet (2 mg total) by mouth daily. 90 tablet 1  . potassium chloride (K-DUR) 10 MEQ tablet TAKE 2 TABLETS (20 MEQ TOTAL) BY MOUTH DAILY (DOSE INCREASED DUE TO HYPOKALEMIA) 180 tablet 3  . sotalol (BETAPACE) 120 MG tablet Take 1 tablet (120 mg total) by mouth every 12 (twelve) hours. 180 tablet 3  . XARELTO 20 MG TABS tablet TAKE 1 TABLET BY MOUTH DAILY WITH SUPPER 90 tablet 1   No current facility-administered medications for this visit.    Social History   Socioeconomic History  . Marital status: Widowed    Spouse name: Not on file  . Number of children: 4  . Years of education: 12th grade  . Highest education level: Not on file  Occupational History  . Occupation: retired    Fish farm manager: RETIRED  Tobacco Use  . Smoking status: Never Smoker  . Smokeless tobacco: Never Used  Substance and Sexual Activity  . Alcohol use: No  . Drug use: No  . Sexual activity: Not Currently  Other Topics Concern  . Not on file  Social History Narrative   Lives at home with fiance.   Right-handed.   No caffeine use.   Social Determinants of Health   Financial Resource Strain:   . Difficulty of Paying Living Expenses: Not on file  Food Insecurity:   . Worried About Charity fundraiser in the Last Year: Not on file  . Ran Out of Food in the Last Year: Not on file  Transportation Needs:   . Lack of Transportation (Medical): Not on file  .  Lack of Transportation (Non-Medical): Not on file  Physical Activity:   . Days of Exercise per Week: Not on file  . Minutes of Exercise per Session: Not on file  Stress:   . Feeling of Stress : Not on file  Social Connections:   . Frequency of Communication with Friends and Family: Not on file  . Frequency of Social Gatherings with Friends and Family: Not on file  . Attends Religious Services: Not on file  . Active Member of Clubs or Organizations: Not on file  . Attends Archivist Meetings: Not on file  . Marital Status: Not on file  Intimate Partner Violence:   . Fear of Current or Ex-Partner: Not on file  . Emotionally Abused: Not on file  . Physically Abused: Not on file  . Sexually Abused: Not on file    Family History  Problem Relation Age of Onset  . Colon cancer Father 26  . Heart disease Father   . Hypertension Father   . Alzheimer's disease Mother   . Thyroid disease Mother   . Hypertension Sister   . Thyroid disease Sister   . Mitral valve prolapse Sister   . Hypertension Son   . Diabetes Son   . Hypertension Son   . Diabetes Son   . Esophageal cancer Neg Hx   . Rectal cancer Neg Hx   . Stomach cancer Neg Hx    Social history  is notable that she is widowed. She has 3 children and 9 grandchildren. She remains active. There is no alcohol tobacco use.   ROS General: Negative; No fevers, chills, or night sweats; positive for fatigue and shortness of breath HEENT: Negative; No changes in vision or hearing, sinus congestion, difficulty swallowing Pulmonary: Negative; No cough, wheezing, hemoptysis Cardiovascular: Positive for PAF, status post cardioversion Positive for left leg claudication GI: Positive for  GERD; dysphagia GU: Negative; No dysuria, hematuria, or difficulty voiding Musculoskeletal: Right shoulder muscle tears Hematologic/Oncology: Negative; no easy bruising, bleeding Endocrine: Negative; no heat/cold intolerance; no diabetes Neuro:  Positive for peripheral neuropathy Skin: Negative; No rashes or skin lesions Psychiatric: Negative; No behavioral problems, depression Sleep: Negative; No snoring, daytime sleepiness, hypersomnolence, bruxism, restless legs, hypnogognic hallucinations, no cataplexy Other comprehensive 14 point system review is negative.   PE BP (!) 144/71   Pulse 61   Temp (!) 97.2 F (36.2 C)   Ht 5' 6"  (1.676 m)   Wt 206 lb 9.6 oz (93.7 kg)   SpO2 100%   BMI 33.35 kg/m    Repeat blood pressure by me was 148/70  Wt Readings from Last 3 Encounters:  09/14/19 206 lb 9.6 oz (93.7 kg)  07/07/19 205 lb (93 kg)  04/04/19 208 lb (94.3 kg)    General: Alert, oriented, no distress.  Skin: normal turgor, no rashes, warm and dry HEENT: Normocephalic, atraumatic. Pupils equal round and reactive to light; sclera anicteric; extraocular muscles intact; Nose without nasal septal hypertrophy Mouth/Parynx benign; Mallinpatti scale 3 Neck: No JVD, no carotid bruits; normal carotid upstroke Lungs: clear to ausculatation and percussion; no wheezing or rales Chest wall: without tenderness to palpitation Heart: PMI not displaced, RRR, s1 s2 normal, 1/6 systolic murmur, no diastolic murmur, no rubs, gallops, thrills, or heaves Abdomen: soft, nontender; no hepatosplenomehaly, BS+; abdominal aorta nontender and not dilated by palpation. Back: no CVA tenderness Pulses 2+ Musculoskeletal: full range of motion, normal strength, no joint deformities Extremities: Trivial right ankle swelling, no swelling on the left, no clubbing cyanosis, Homan's sign negative  Neurologic: grossly nonfocal; Cranial nerves grossly wnl Psychologic: Normal mood and affect  ECG (independently read by me): Normal sinus rhythm at 61 bpm.  PR interval 170 ms, QTc interval 479 ms  June 2020 ECG (independently read by me): Sinus bradycardia at 52 bpm.  QTc interval 453 ms on sotalol;  no significant ST-T changes  June 2019 ECG (independently  read by me): Sinus bradycardia 56 bpm.  QTc interval 474 ms.  December 2018 ECG (independently read by me): Normal sinus rhythm at 60 bpm.  QTc interval 454 ms.  No ST segment changes.  October 2018 ECG (independently read by me): Normal sinus rhythm at 62 bpm.  No significant ST-T changes.  No ectopy.  QTc interval 452 ms.  May 2018 ECG (independently read by me): Sinus bradycardia 59 bpm.  Low voltage.  Normal intervals.  No ST segment changes.  October 2017 ECG (independently read by me): Sinus bradycardia 57 bpm.  No ectopy.  Normal intervals.  November 2016 ECG (independently read by me): Sinus bradycardia 59 bpm.  No ectopy.  Normal intervals.  QTC 457 ms.  05/22/2015 ECG (independently read by me): Normal sinus rhythm at 60 bpm.  QTc interval 454 ms.  No significant ST segment changes.  June 2016 ECG (independently read by me): Normal sinus rhythm at 63 bpm.  Poor precordial R-wave progression.  No ectopy.  November 2015 ECG (independently read by me);  Normal sinus rhythm at 59 bpm.  QTc interval 415 ms.  No significant ST segment changes.  Prior November 2014ECG: Sinus rhythm at 63 beats per minute. Normal intervals.  LABS: BMP Latest Ref Rng & Units 02/28/2019 09/29/2018 06/14/2018  Glucose 65 - 99 mg/dL 98 100(H) 105(H)  BUN 8 - 27 mg/dL 18 23 12   Creatinine 0.57 - 1.00 mg/dL 0.94 0.82 1.03(H)  BUN/Creat Ratio 12 -  28 19 - -  Sodium 134 - 144 mmol/L 142 144 144  Potassium 3.5 - 5.2 mmol/L 3.9 3.9 4.1  Chloride 96 - 106 mmol/L 102 104 107  CO2 20 - 29 mmol/L 24 30 29   Calcium 8.7 - 10.3 mg/dL 10.0 10.2 9.7   Hepatic Function Latest Ref Rng & Units 02/28/2019 03/30/2018 12/28/2017  Total Protein 6.0 - 8.5 g/dL 6.7 6.9 7.2  Albumin 3.6 - 4.6 g/dL 4.4 4.3 4.2  AST 0 - 40 IU/L 21 21 19   ALT 0 - 32 IU/L 17 18 14   Alk Phosphatase 39 - 117 IU/L 46 50 48  Total Bilirubin 0.0 - 1.2 mg/dL 1.0 1.1 1.1  Bilirubin, Direct 0.00 - 0.40 mg/dL - 0.23 -   CBC Latest Ref Rng & Units  02/28/2019 06/14/2018 03/30/2018  WBC 3.4 - 10.8 x10E3/uL 4.5 5.9 5.0  Hemoglobin 11.1 - 15.9 g/dL 13.5 14.1 15.1  Hematocrit 34.0 - 46.6 % 42.7 45.1 47.1(H)  Platelets 150 - 450 x10E3/uL 177 211 179   Lab Results  Component Value Date   MCV 83 02/28/2019   MCV 96.2 06/14/2018   MCV 96 03/30/2018   Lab Results  Component Value Date   TSH 2.310 02/28/2019   Lab Results  Component Value Date   HGBA1C 5.5 12/18/2016   Lipid Panel     Component Value Date/Time   CHOL 168 02/28/2019 0952   TRIG 209 (H) 02/28/2019 0952   HDL 54 02/28/2019 0952   CHOLHDL 3.1 02/28/2019 0952   CHOLHDL 5 12/28/2017 1340   VLDL 68.0 (H) 12/28/2017 1340   LDLCALC 72 02/28/2019 0952   LDLDIRECT 150.0 12/28/2017 1340    RADIOLOGY: US Breast Right  07/04/2013   CLINICAL DATA:  Abnormal screening right mammogram.  EXAM: DIGITAL DIAGNOSTIC  right MAMMOGRAM  ULTRASOUND right BREAST  COMPARISON:  With prior exams.  ACR Breast Density Category b: There are scattered areas of fibroglandular density.  FINDINGS: Spot compression views of the lateral aspect of the right breast were performed. There is persistence of a 4 mm low-density nodule in the posterior 3rd of the breast. There are no malignant type microcalcifications.  On physical exam I do not palpate a mass in the right breast.  Ultrasound is performed, showing there is a near anechoic lesion in the right breast at 7 o'clock 8 cm from the nipple measuring 3 x 2 x 3 mm.  IMPRESSION: Probable benign lesion in the right breast.  RECOMMENDATION: Short-term interval followup right mammogram and ultrasound in 6 months is recommended to document stability.  I have discussed the findings and recommendations with the patient. Results were also provided in writing at the conclusion of the visit. If applicable, a reminder letter will be sent to the patient regarding the next appointment.  BI-RADS CATEGORY  3: Probably benign finding(s) - short interval follow-up suggested.    Electronically Signed   By: Lillia Mountain M.D.   On: 07/04/2013 09:01   Mm [place] R  07/04/2013   CLINICAL DATA:  Abnormal screening right mammogram.  EXAM: DIGITAL DIAGNOSTIC  right MAMMOGRAM  ULTRASOUND right BREAST  COMPARISON:  With prior exams.  ACR Breast Density Category b: There are scattered areas of fibroglandular density.  FINDINGS: Spot compression views of the lateral aspect of the right breast were performed. There is persistence of a 4 mm low-density nodule in the posterior 3rd of the breast. There are no malignant type microcalcifications.  On physical exam I do  not palpate a mass in the right breast.  Ultrasound is performed, showing there is a near anechoic lesion in the right breast at 7 o'clock 8 cm from the nipple measuring 3 x 2 x 3 mm.  IMPRESSION: Probable benign lesion in the right breast.  RECOMMENDATION: Short-term interval followup right mammogram and ultrasound in 6 months is recommended to document stability.  I have discussed the findings and recommendations with the patient. Results were also provided in writing at the conclusion of the visit. If applicable, a reminder letter will be sent to the patient regarding the next appointment.  BI-RADS CATEGORY  3: Probably benign finding(s) - short interval follow-up suggested.   Electronically Signed   By: Lillia Mountain M.D.   On: 07/04/2013 09:01   IMPRESSION:  1. Paroxysmal atrial fibrillation (HCC)   2. Essential hypertension   3. Dyslipidemia, goal LDL below 70   4. Medication management   5. Grade II diastolic dysfunction   6. Edema of right ankle   7. PVD (peripheral vascular disease) ([place])      ASSESSMENT AND PLAN: [name] is an 86 year old female who has a history of hypertension, paroxysmal atrial fibrillation,  hyperlipidemia and peripheral vascular disease. She  is on Xarelto for anticoagulation and tolerating this well.  She developed recurrent AF and underwent successful cardioversion during her  emergency room evaluation on 01/15/2017.  She developed recurrent A. fib despite taking metoprolol and sotalol 80 mg twice a day.  She had 2 failed attempts in the emergency room in early November when she presented with AF with RVR.  Ultimately, her sotalol dose was increased to 120 twice a day and she was able to be successfully cardioverted.  Subsequently, she has done well has been without recurrent AF on her increased sotalol dose in addition to metoprolol.  Her most recent echo has demonstrated normal systolic function, moderate LVH with grade 2 diastolic dysfunction.  She has had blood pressure lability noted on several recent evaluations.  She is currently on sotalol 120 mg twice a day and metoprolol tartrate 25 mg twice a day.  Her blood pressure today by me was 148/78.  I have suggested she increase her morning dose of metoprolol to 37.5 mg but she will continue with the 25 mg dose at bedtime.  He has issues with hyperlipidemia but seems to be able to tolerate Livalo 2 mg daily.  LDL cholesterol had improved from 1 50-72 when last checked.  She can continues to be on chlorthalidone 25 mg daily for diuretic both for blood pressure as well as ankle swelling.  There is just trivial right ankle edema today.  She has peripheral vascular disease with previous documentation of an ABI of 0.63 on the left and 1.0 on the right with occlusive disease in the proximal to mid left superficial femoral artery with monophasic flow three-vessel runoff in the popliteal and anterior tibial arteries.  She is fasting today and complete set of fasting laboratory will be obtained.  There is her shoulder discomfort has significantly improved, surgery has been deferred.  I will see her in 6 months for reevaluation or sooner if problems arise.    Time spent: 25 minutes [place] Sine, MD, [facility]  09/14/2019 9:37 AM

## 2019-09-14 NOTE — Patient Instructions (Signed)
Medication Instructions:  INCREASE- Metoprolol take 1 1/2 tablets(37.5 mg ) in the morning and 1 tablet(25 mg) at bedtime  *If you need a refill on your cardiac medications before your next appointment, please call your pharmacy*  Lab Work: CBC, TSH, CMP and Fasting Lipids  If you have labs (blood work) drawn today and your tests are completely normal, you will receive your results only by: Marland Kitchen MyChart Message (if you have MyChart) OR . A paper copy in the mail If you have any lab test that is abnormal or we need to change your treatment, we will call you to review the results.  Testing/Procedures: None Ordered  Follow-Up: At [facility], you and your health needs are our priority.  As part of our continuing mission to provide you with exceptional heart care, we have created designated Provider Care Teams.  These Care Teams include your primary Cardiologist (physician) and Advanced Practice Providers (APPs -  Physician Assistants and Nurse Practitioners) who all work together to provide you with the care you need, when you need it.  Your next appointment:   6 month(s)  The format for your next appointment:   In Person  Provider:   Shelva Majestic, MD

## 2019-10-10 ENCOUNTER — Other Ambulatory Visit: Payer: Self-pay | Admitting: Internal Medicine

## 2019-10-10 DIAGNOSIS — L659 Nonscarring hair loss, unspecified: Secondary | ICD-10-CM

## 2019-11-01 ENCOUNTER — Telehealth: Payer: Self-pay | Admitting: Internal Medicine

## 2019-11-01 NOTE — Chronic Care Management (AMB) (Signed)
  Chronic Care Management   Note  11/01/2019 Name: BRIA SPARR MRN: [id number] DOB: [birth year]  BREANNAH KRATT is a 86 y.o. year old female who is a primary care patient of Janith Lima, MD. I reached out to [name] by phone today in response to a referral sent by Ms. Orlean Patten Able's PCP, Janith Lima, MD.   Ms. Melichar was given information about Chronic Care Management services today including:  1. CCM service includes personalized support from designated clinical staff supervised by her physician, including individualized plan of care and coordination with other care providers 2. 24/7 contact phone numbers for assistance for urgent and routine care needs. 3. Service will only be billed when office clinical staff spend 20 minutes or more in a month to coordinate care. 4. Only one practitioner may furnish and bill the service in a calendar month. 5. The patient may stop CCM services at any time (effective at the end of the month) by phone call to the office staff. 6. The patient will be responsible for cost sharing (co-pay) of up to 20% of the service fee (after annual deductible is met).  Patient did not agree to enrollment in care management services and does not wish to consider at this time.  Follow up plan:   Raynicia Dukes [employer] Scheduler

## 2019-11-03 ENCOUNTER — Other Ambulatory Visit: Payer: Self-pay | Admitting: Internal Medicine

## 2019-11-03 DIAGNOSIS — I10 Essential (primary) hypertension: Secondary | ICD-10-CM

## 2019-12-07 ENCOUNTER — Other Ambulatory Visit: Payer: Self-pay | Admitting: Internal Medicine

## 2019-12-07 DIAGNOSIS — E538 Deficiency of other specified B group vitamins: Secondary | ICD-10-CM

## 2019-12-28 ENCOUNTER — Other Ambulatory Visit: Payer: Self-pay | Admitting: Internal Medicine

## 2020-01-26 ENCOUNTER — Ambulatory Visit (INDEPENDENT_AMBULATORY_CARE_PROVIDER_SITE_OTHER): Payer: Medicare HMO | Admitting: Internal Medicine

## 2020-01-26 ENCOUNTER — Other Ambulatory Visit: Payer: Self-pay

## 2020-01-26 ENCOUNTER — Encounter: Payer: Self-pay | Admitting: Internal Medicine

## 2020-01-26 VITALS — BP 152/82 | HR 61 | Temp 97.9°F | Resp 16 | Ht 66.0 in | Wt 208.0 lb

## 2020-01-26 DIAGNOSIS — I10 Essential (primary) hypertension: Secondary | ICD-10-CM | POA: Diagnosis not present

## 2020-01-26 DIAGNOSIS — I480 Paroxysmal atrial fibrillation: Secondary | ICD-10-CM

## 2020-01-26 DIAGNOSIS — Z0000 Encounter for general adult medical examination without abnormal findings: Secondary | ICD-10-CM

## 2020-01-26 DIAGNOSIS — M8949 Other hypertrophic osteoarthropathy, multiple sites: Secondary | ICD-10-CM | POA: Diagnosis not present

## 2020-01-26 DIAGNOSIS — E785 Hyperlipidemia, unspecified: Secondary | ICD-10-CM

## 2020-01-26 DIAGNOSIS — G320 Subacute combined degeneration of spinal cord in diseases classified elsewhere: Secondary | ICD-10-CM | POA: Diagnosis not present

## 2020-01-26 DIAGNOSIS — M159 Polyosteoarthritis, unspecified: Secondary | ICD-10-CM

## 2020-01-26 DIAGNOSIS — E538 Deficiency of other specified B group vitamins: Secondary | ICD-10-CM

## 2020-01-26 LAB — CBC WITH DIFFERENTIAL/PLATELET
Basophils Absolute: 0 10*3/uL (ref 0.0–0.1)
Basophils Relative: 0.9 % (ref 0.0–3.0)
Eosinophils Absolute: 0.2 10*3/uL (ref 0.0–0.7)
Eosinophils Relative: 4.9 % (ref 0.0–5.0)
HCT: 38 % (ref 36.0–46.0)
Hemoglobin: 12.5 g/dL (ref 12.0–15.0)
Lymphocytes Relative: 36.3 % (ref 12.0–46.0)
Lymphs Abs: 1.7 10*3/uL (ref 0.7–4.0)
MCHC: 32.7 g/dL (ref 30.0–36.0)
MCV: 85.1 fl (ref 78.0–100.0)
Monocytes Absolute: 0.5 10*3/uL (ref 0.1–1.0)
Monocytes Relative: 10.2 % (ref 3.0–12.0)
Neutro Abs: 2.2 10*3/uL (ref 1.4–7.7)
Neutrophils Relative %: 47.7 % (ref 43.0–77.0)
Platelets: 188 10*3/uL (ref 150.0–400.0)
RBC: 4.47 Mil/uL (ref 3.87–5.11)
RDW: 15.8 % — ABNORMAL HIGH (ref 11.5–15.5)
WBC: 4.6 10*3/uL (ref 4.0–10.5)

## 2020-01-26 LAB — VITAMIN B12: Vitamin B-12: 1037 pg/mL — ABNORMAL HIGH (ref 211–911)

## 2020-01-26 LAB — FOLATE: Folate: >23.7 ng/mL (ref >5.9)

## 2020-01-26 LAB — BASIC METABOLIC PANEL
BUN: 21 mg/dL (ref 6–23)
CO2: 30 mEq/L (ref 19–32)
Calcium: 9.7 mg/dL (ref 8.4–10.5)
Chloride: 104 mEq/L (ref 96–112)
Creatinine, Ser: 0.86 mg/dL (ref 0.40–1.20)
GFR: 62.69 mL/min (ref >60.00)
Glucose, Bld: 99 mg/dL (ref 70–99)
Potassium: 3.6 mEq/L (ref 3.5–5.1)
Sodium: 141 mEq/L (ref 135–145)

## 2020-01-26 LAB — CK: Total CK: 60 U/L (ref 7–177)

## 2020-01-26 MED ORDER — TRAMADOL HCL 50 MG PO TABS: 50.0000 mg | ORAL_TABLET | Freq: Four times a day (QID) | ORAL | 5 refills | Status: DC | PRN

## 2020-01-26 MED ORDER — GABAPENTIN 100 MG PO CAPS: ORAL_CAPSULE | ORAL | 1 refills | Status: DC

## 2020-01-26 NOTE — Progress Notes (Signed)
Subjective:  Patient ID: [name], female    DOB: 25-Sep-1932  Age: 86 y.o. MRN: [id number]  CC: Hypertension and Annual Exam  This visit occurred during the SARS-CoV-2 Turner health emergency.  Safety protocols were in place, including screening questions prior to the visit, additional usage of staff PPE, and extensive cleaning of exam room while observing appropriate contact time as indicated for disinfecting solutions.    HPI [name] presents for a CPX.  She continues to complain of pain in her feet.  She describes it as a burning discomfort with numbness, tingling, and difficulty using her feet.  She wants to know if she can increase the dose of gabapentin.  She also complains of myalgias and arthralgias and wants to know if she can take tramadol.  She is active and denies any recent episodes of palpitations, headache, blurred vision, chest pain, shortness of breath, edema, or fatigue.  Outpatient Medications Prior to Visit  Medication Sig Dispense Refill  . acetaminophen (TYLENOL) 500 MG tablet Take 1,000 mg by mouth every 6 (six) hours as needed for headache (pain).    . Calcium Carbonate-Vitamin D (CALCIUM-D PO) Take 1 tablet by mouth at bedtime. Calcium 630 mg, Vitamin D3 500 units    . chlorthalidone (HYGROTON) 25 MG tablet TAKE 1 TABLET (25 MG TOTAL) BY MOUTH DAILY. 90 tablet 0  . diclofenac sodium (VOLTAREN) 1 % GEL Apply 2 g topically 4 (four) times daily. 100 g 1  . Dietary Management Product (FOSTEUM PLUS) CAPS TAKE ONE CAPSULE BY MOUTH TWICE DAILY 180 capsule 1  . famotidine (PEPCID) 40 MG tablet TAKE 1 TABLET (40 MG TOTAL) BY MOUTH AT BEDTIME. 90 tablet 1  . finasteride (PROPECIA) 1 MG tablet TAKE 1 TABLET BY MOUTH AT BEDTIME 90 tablet 1  . metoprolol tartrate (LOPRESSOR) 25 MG tablet Take 1 1/2(37.5 mg) tablet in the morning and 1(25 mg) tablet at bedtime 225 tablet 3  . Pitavastatin Calcium (LIVALO) 2 MG TABS Take 1 tablet (2 mg total) by mouth daily. 90  tablet 1  . potassium chloride (K-DUR) 10 MEQ tablet TAKE 2 TABLETS (20 MEQ TOTAL) BY MOUTH DAILY (DOSE INCREASED DUE TO HYPOKALEMIA) 180 tablet 3  . sotalol (BETAPACE) 120 MG tablet Take 1 tablet (120 mg total) by mouth every 12 (twelve) hours. 180 tablet 3  . XARELTO 20 MG TABS tablet TAKE 1 TABLET BY MOUTH DAILY WITH SUPPER 90 tablet 1  . Cyanocobalamin 2500 MCG TABS Take 2,500 mcg by mouth daily. Vitamin B12    . gabapentin (NEURONTIN) 100 MG capsule TAKE 1 CAPSULE THREE TIMES DAILY, AT BREAKFAST,  SUPPER AND BEDTIME 270 capsule 1   No facility-administered medications prior to visit.    ROS Review of Systems  Constitutional: Negative.  Negative for diaphoresis and fatigue.  HENT: Negative.  Negative for trouble swallowing.   Eyes: Negative.  Negative for visual disturbance.  Respiratory: Negative for cough, chest tightness, shortness of breath and wheezing.   Cardiovascular: Negative for chest pain, palpitations and leg swelling.  Gastrointestinal: Negative for abdominal pain, constipation, diarrhea, nausea and vomiting.  Endocrine: Negative.   Genitourinary: Negative.  Negative for difficulty urinating.  Musculoskeletal: Positive for arthralgias, gait problem and myalgias. Negative for back pain, neck pain and neck stiffness.  Skin: Negative.  Negative for color change, pallor and rash.  Neurological: Positive for numbness. Negative for dizziness, tremors, syncope, facial asymmetry, weakness, light-headedness and headaches.  Hematological: Negative for adenopathy. Does not bruise/bleed easily.  Psychiatric/Behavioral: Negative.     Objective:  BP (!) 152/82 (BP Location: Left Arm, Patient Position: Sitting, Cuff Size: Normal)   Pulse 61   Temp 97.9 F (36.6 C) (Oral)   Resp 16   Ht 5\' 6"  (1.676 m)   Wt 208 lb (94.3 kg)   SpO2 97%   BMI 33.57 kg/m   BP Readings from Last 3 Encounters:  01/26/20 (!) 152/82  09/14/19 (!) 144/71  07/07/19 (!) 158/86    Wt Readings from  Last 3 Encounters:  01/26/20 208 lb (94.3 kg)  09/14/19 206 lb 9.6 oz (93.7 kg)  07/07/19 205 lb (93 kg)    Physical Exam Vitals reviewed.  Constitutional:      Appearance: Normal appearance.  HENT:     Nose: Nose normal.     Mouth/Throat:     Mouth: Mucous membranes are moist.  Eyes:     General: No scleral icterus.    Conjunctiva/sclera: Conjunctivae normal.  Cardiovascular:     Rate and Rhythm: Normal rate and regular rhythm.     Pulses:          Dorsalis pedis pulses are 1+ on the right side and 1+ on the left side.       Posterior tibial pulses are 1+ on the right side and 1+ on the left side.     Heart sounds: No murmur.  Pulmonary:     Effort: Pulmonary effort is normal.     Breath sounds: No stridor. No wheezing, rhonchi or rales.  Abdominal:     General: Abdomen is flat. Bowel sounds are normal. There is no distension.     Palpations: Abdomen is soft. There is no hepatomegaly, splenomegaly or mass.     Tenderness: There is no abdominal tenderness.  Musculoskeletal:        General: Normal range of motion.     Cervical back: Neck supple.     Right lower leg: No edema.     Left lower leg: No edema.     Right foot: Normal range of motion. No deformity.     Left foot: Normal range of motion. No deformity.  Feet:     Right foot:     Skin integrity: Skin integrity normal. No ulcer.     Left foot:     Skin integrity: Skin integrity normal. No ulcer.  Lymphadenopathy:     Cervical: No cervical adenopathy.  Skin:    General: Skin is warm and dry.     Coloration: Skin is not pale.  Neurological:     General: No focal deficit present.     Mental Status: She is alert and oriented to person, place, and time. Mental status is at baseline.     Cranial Nerves: Cranial nerves are intact.     Sensory: Sensory deficit present.     Motor: Motor function is intact. No weakness or atrophy.     Coordination: Coordination is intact.     Deep Tendon Reflexes: Reflexes normal.      Reflex Scores:      Tricep reflexes are 1+ on the right side and 1+ on the left side.      Bicep reflexes are 1+ on the right side and 1+ on the left side.      Brachioradialis reflexes are 1+ on the right side and 1+ on the left side.      Patellar reflexes are 0 on the right side and 0 on the left side.  Achilles reflexes are 0 on the right side and 0 on the left side. Psychiatric:        Mood and Affect: Mood normal.        Behavior: Behavior normal.        Thought Content: Thought content normal.        Judgment: Judgment normal.     Lab Results  Component Value Date   WBC 4.6 01/26/2020   HGB 12.5 01/26/2020   HCT 38.0 01/26/2020   PLT 188.0 01/26/2020   GLUCOSE 99 01/26/2020   CHOL 184 09/14/2019   TRIG 183 (H) 09/14/2019   HDL 54 09/14/2019   LDLDIRECT 150.0 12/28/2017   LDLCALC 99 09/14/2019   ALT 15 09/14/2019   AST 21 09/14/2019   NA 141 01/26/2020   K 3.6 01/26/2020   CL 104 01/26/2020   CREATININE 0.86 01/26/2020   BUN 21 01/26/2020   CO2 30 01/26/2020   TSH 2.350 09/14/2019   INR 1.02 07/07/2017   HGBA1C 5.5 12/18/2016    CT SHOULDER RIGHT WO CONTRAST  Result Date: 06/06/2019 CLINICAL DATA:  Chronic right shoulder pain. EXAM: CT OF THE UPPER RIGHT EXTREMITY WITHOUT CONTRAST TECHNIQUE: Multidetector CT imaging of the upper right extremity was performed according to the standard protocol. COMPARISON:  Right shoulder x-rays dated April 20, 2019. MRI right shoulder dated September 18, 2014. FINDINGS: Bones/Joint/Cartilage No fracture or dislocation. Normal alignment. Small joint effusion. Mild osteoarthritis of the glenohumeral joint with mild joint space narrowing and marginal osteophytosis. Chondrocalcinosis. Moderate arthropathy of the acromioclavicular joint. Type II acromion. Ligaments Ligaments are suboptimally evaluated by CT. Muscles and Tendons Moderate infraspinatus and mild supraspinatus muscle atrophy. Peritendinous calcification along the  subscapularis myotendinous junction. Soft tissue No fluid collection or hematoma.  No soft tissue mass. IMPRESSION: 1. Mild glenohumeral osteoarthritis. Chondrocalcinosis could reflect underlying CPPD arthropathy. 2. Moderate infraspinatus and mild supraspinatus muscle atrophy related to underlying full-thickness tendon tears seen on prior MRI from January 2016. 3. Moderate acromioclavicular osteoarthritis. Electronically Signed   By: Titus Dubin M.D.   On: 06/06/2019 19:43    Assessment & Plan:   Alura was seen today for hypertension and annual exam.  Diagnoses and all orders for this visit:  Essential hypertension- Her blood pressure is adequately well controlled.  Electrolytes and renal function are normal. -     Basic metabolic panel; Future -     Basic metabolic panel  Paroxysmal atrial fibrillation ([place])- She has good rate and rhythm control.  Will continue anticoagulation with the DOAC.  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  I have asked her to decrease her dosage of B12 supplement.  Will increase the dose of gabapentin. -     gabapentin (NEURONTIN) 100 MG capsule; TAKE 1 CAPSULE QID -     Vitamin B12; Future -     Folate; Future -     CBC with Differential/Platelet; Future -     CBC with Differential/Platelet -     Folate -     Vitamin B12 -     cyanocobalamin 2000 MCG tablet; Take 1 tablet (2,000 mcg total) by mouth every other day.  Routine general medical examination at a health care facility- Exam completed, labs reviewed, she refused flu vaccines against pneumonia/influenza/tetanus, no cancer screenings are indicated, patient education material was given.  Primary osteoarthritis involving multiple joints -     traMADol (ULTRAM) 50 MG tablet; Take  1 tablet (50 mg total) by mouth every 6 (six) hours as needed.  Dyslipidemia, goal LDL below 70- She has achieved her LDL goal and complains of  arthralgias and myalgias but her CK is normal.  I do not think her symptoms are related to statin induced myopathy.  She agrees to continue taking Livalo. -     CK; Future -     CK   I have discontinued Orlean Patten. Mcclory's Cyanocobalamin. I have also changed her gabapentin. Additionally, I am having her start on traMADol and cyanocobalamin. Lastly, I am having her maintain her Calcium Carbonate-Vitamin D (CALCIUM-D PO), acetaminophen, Livalo, diclofenac sodium, potassium chloride, Xarelto, famotidine, metoprolol tartrate, sotalol, finasteride, chlorthalidone, and Fosteum Plus.  Meds ordered this encounter  Medications  . gabapentin (NEURONTIN) 100 MG capsule    Sig: TAKE 1 CAPSULE QID    Dispense:  360 capsule    Refill:  1  . traMADol (ULTRAM) 50 MG tablet    Sig: Take 1 tablet (50 mg total) by mouth every 6 (six) hours as needed.    Dispense:  90 tablet    Refill:  5  . cyanocobalamin 2000 MCG tablet    Sig: Take 1 tablet (2,000 mcg total) by mouth every other day.    Dispense:  45 tablet    Refill:  1   In addition to time spent on CPE, I spent 60 minutes in preparing to see the patient by review of recent labs, imaging and procedures, obtaining and reviewing separately obtained history, communicating with the patient and family or caregiver, ordering medications, tests or procedures, and documenting clinical information in the EHR including the differential Dx, treatment, and any further evaluation and other management of 1. Essential hypertension 2. Paroxysmal atrial fibrillation (HCC) 3. 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 Primary osteoarthritis involving multiple joints 5. Dyslipidemia, goal LDL below 70     Follow-up: Return in about 6 months (around 07/28/2020).  [name], MD

## 2020-01-26 NOTE — Patient Instructions (Signed)
Health Maintenance, Female Adopting a healthy lifestyle and getting preventive care are important in promoting health and wellness. Ask your health care provider about:  The right schedule for you to have regular tests and exams.  Things you can do on your own to prevent diseases and keep yourself healthy. What should I know about diet, weight, and exercise? Eat a healthy diet   Eat a diet that includes plenty of vegetables, fruits, low-fat dairy products, and lean protein.  Do not eat a lot of foods that are high in solid fats, added sugars, or sodium. Maintain a healthy weight Body mass index (BMI) is used to identify weight problems. It estimates body fat based on height and weight. Your health care provider can help determine your BMI and help you achieve or maintain a healthy weight. Get regular exercise Get regular exercise. This is one of the most important things you can do for your health. Most adults should:  Exercise for at least 150 minutes each week. The exercise should increase your heart rate and make you sweat (moderate-intensity exercise).  Do strengthening exercises at least twice a week. This is in addition to the moderate-intensity exercise.  Spend less time sitting. Even light physical activity can be beneficial. Watch cholesterol and blood lipids Have your blood tested for lipids and cholesterol at 84 years of age, then have this test every 5 years. Have your cholesterol levels checked more often if:  Your lipid or cholesterol levels are high.  You are older than 84 years of age.  You are at high risk for heart disease. What should I know about cancer screening? Depending on your health history and family history, you may need to have cancer screening at various ages. This may include screening for:  Breast cancer.  Cervical cancer.  Colorectal cancer.  Skin cancer.  Lung cancer. What should I know about heart disease, diabetes, and high blood  pressure? Blood pressure and heart disease  High blood pressure causes heart disease and increases the risk of stroke. This is more likely to develop in people who have high blood pressure readings, are of African descent, or are overweight.  Have your blood pressure checked: ? Every 3-5 years if you are 18-39 years of age. ? Every year if you are 40 years old or older. Diabetes Have regular diabetes screenings. This checks your fasting blood sugar level. Have the screening done:  Once every three years after age 40 if you are at a normal weight and have a low risk for diabetes.  More often and at a younger age if you are overweight or have a high risk for diabetes. What should I know about preventing infection? Hepatitis B If you have a higher risk for hepatitis B, you should be screened for this virus. Talk with your health care provider to find out if you are at risk for hepatitis B infection. Hepatitis C Testing is recommended for:  Everyone born from 1945 through 1965.  Anyone with known risk factors for hepatitis C. Sexually transmitted infections (STIs)  Get screened for STIs, including gonorrhea and chlamydia, if: ? You are sexually active and are younger than 84 years of age. ? You are older than 84 years of age and your health care provider tells you that you are at risk for this type of infection. ? Your sexual activity has changed since you were last screened, and you are at increased risk for chlamydia or gonorrhea. Ask your health care provider if   you are at risk.  Ask your health care provider about whether you are at high risk for HIV. Your health care provider may recommend a prescription medicine to help prevent HIV infection. If you choose to take medicine to prevent HIV, you should first get tested for HIV. You should then be tested every 3 months for as long as you are taking the medicine. Pregnancy  If you are about to stop having your period (premenopausal) and  you may become pregnant, seek counseling before you get pregnant.  Take 400 to 800 micrograms (mcg) of folic acid every day if you become pregnant.  Ask for birth control (contraception) if you want to prevent pregnancy. Osteoporosis and menopause Osteoporosis is a disease in which the bones lose minerals and strength with aging. This can result in bone fractures. If you are 65 years old or older, or if you are at risk for osteoporosis and fractures, ask your health care provider if you should:  Be screened for bone loss.  Take a calcium or vitamin D supplement to lower your risk of fractures.  Be given hormone replacement therapy (HRT) to treat symptoms of menopause. Follow these instructions at home: Lifestyle  Do not use any products that contain nicotine or tobacco, such as cigarettes, e-cigarettes, and chewing tobacco. If you need help quitting, ask your health care provider.  Do not use street drugs.  Do not share needles.  Ask your health care provider for help if you need support or information about quitting drugs. Alcohol use  Do not drink alcohol if: ? Your health care provider tells you not to drink. ? You are pregnant, may be pregnant, or are planning to become pregnant.  If you drink alcohol: ? Limit how much you use to 0-1 drink a day. ? Limit intake if you are breastfeeding.  Be aware of how much alcohol is in your drink. In the [place] one drink equals one 12 oz bottle of beer (355 mL), one 5 oz glass of wine (148 mL), or one 1 oz glass of hard liquor (44 mL). General instructions  Schedule regular health, dental, and eye exams.  Stay current with your vaccines.  Tell your health care provider if: ? You often feel depressed. ? You have ever been abused or do not feel safe at home. Summary  Adopting a healthy lifestyle and getting preventive care are important in promoting health and wellness.  Follow your health care provider's instructions about healthy  diet, exercising, and getting tested or screened for diseases.  Follow your health care provider's instructions on monitoring your cholesterol and blood pressure. This information is not intended to replace advice given to you by your health care provider. Make sure you discuss any questions you have with your health care provider. Document Revised: 08/11/2018 Document Reviewed: 08/11/2018 Elsevier Patient Education  2020 [employer]  

## 2020-01-27 ENCOUNTER — Encounter: Payer: Self-pay | Admitting: Internal Medicine

## 2020-01-27 MED ORDER — CYANOCOBALAMIN 2000 MCG PO TABS: 2000.0000 ug | ORAL_TABLET | ORAL | 1 refills | Status: AC

## 2020-02-08 ENCOUNTER — Other Ambulatory Visit: Payer: Self-pay | Admitting: Internal Medicine

## 2020-02-08 DIAGNOSIS — I480 Paroxysmal atrial fibrillation: Secondary | ICD-10-CM

## 2020-02-11 ENCOUNTER — Other Ambulatory Visit: Payer: Self-pay | Admitting: Internal Medicine

## 2020-02-11 DIAGNOSIS — I10 Essential (primary) hypertension: Secondary | ICD-10-CM

## 2020-02-20 ENCOUNTER — Other Ambulatory Visit: Payer: Self-pay | Admitting: Internal Medicine

## 2020-02-20 ENCOUNTER — Other Ambulatory Visit: Payer: Self-pay | Admitting: Cardiovascular Disease

## 2020-02-20 DIAGNOSIS — L659 Nonscarring hair loss, unspecified: Secondary | ICD-10-CM

## 2020-02-27 DIAGNOSIS — L649 Androgenic alopecia, unspecified: Secondary | ICD-10-CM | POA: Diagnosis not present

## 2020-02-27 DIAGNOSIS — L821 Other seborrheic keratosis: Secondary | ICD-10-CM | POA: Diagnosis not present

## 2020-03-07 DIAGNOSIS — H25013 Cortical age-related cataract, bilateral: Secondary | ICD-10-CM | POA: Diagnosis not present

## 2020-03-07 DIAGNOSIS — H2513 Age-related nuclear cataract, bilateral: Secondary | ICD-10-CM | POA: Diagnosis not present

## 2020-03-07 DIAGNOSIS — H5203 Hypermetropia, bilateral: Secondary | ICD-10-CM | POA: Diagnosis not present

## 2020-03-07 DIAGNOSIS — H40033 Anatomical narrow angle, bilateral: Secondary | ICD-10-CM | POA: Diagnosis not present

## 2020-05-16 ENCOUNTER — Other Ambulatory Visit: Payer: Self-pay | Admitting: Internal Medicine

## 2020-05-16 DIAGNOSIS — I10 Essential (primary) hypertension: Secondary | ICD-10-CM

## 2020-05-25 ENCOUNTER — Encounter: Payer: Self-pay | Admitting: Cardiovascular Disease

## 2020-05-25 ENCOUNTER — Ambulatory Visit: Payer: Medicare HMO | Admitting: Cardiovascular Disease

## 2020-05-25 ENCOUNTER — Other Ambulatory Visit: Payer: Self-pay

## 2020-05-25 VITALS — BP 140/76 | HR 55 | Temp 97.4°F | Ht 66.0 in | Wt 198.0 lb

## 2020-05-25 DIAGNOSIS — Z7901 Long term (current) use of anticoagulants: Secondary | ICD-10-CM

## 2020-05-25 DIAGNOSIS — I10 Essential (primary) hypertension: Secondary | ICD-10-CM | POA: Diagnosis not present

## 2020-05-25 DIAGNOSIS — I739 Peripheral vascular disease, unspecified: Secondary | ICD-10-CM

## 2020-05-25 DIAGNOSIS — I480 Paroxysmal atrial fibrillation: Secondary | ICD-10-CM | POA: Diagnosis not present

## 2020-05-25 DIAGNOSIS — E785 Hyperlipidemia, unspecified: Secondary | ICD-10-CM | POA: Diagnosis not present

## 2020-05-25 NOTE — Patient Instructions (Signed)
   Follow-Up: At [facility], you and your health needs are our priority.  As part of our continuing mission to provide you with exceptional heart care, we have created designated Provider Care Teams.  These Care Teams include your primary Cardiologist (physician) and Advanced Practice Providers (APPs -  Physician Assistants and Nurse Practitioners) who all work together to provide you with the care you need, when you need it.  We recommend signing up for the patient portal called "MyChart".  Sign up information is provided on this After Visit Summary.  MyChart is used to connect with patients for Virtual Visits (Telemedicine).  Patients are able to view lab/test results, encounter notes, upcoming appointments, etc.  Non-urgent messages can be sent to your provider as well.   To learn more about what you can do with MyChart, go to [email or web address].    Your next appointment:   12 month(s)  The format for your next appointment:   In Person  Provider:   You may see Thomas Kelly, MD or one of the following Advanced Practice Providers on your designated Care Team:    Hao Meng, PA-C  Angela Duke, PA-C or   Krista Kroeger, PA-C     

## 2020-05-25 NOTE — Progress Notes (Signed)
Patient ID: [name], female   DOB: 1933-03-26, 86 y.o.   MRN: [id number]    PCP: Dr. Eilleen Kempf  HPI: [name] is a 86 y.o. female who presents for an 8 month cardiology evaluation.  [name] has a history of paroxysmal atrial fibrillation, hypertension, as well as hyperlipidemia. Remotely, she developed myalgias with simvastatin and had not been willing to try additional lipid lowering therapy. In August 2012 an echo Doppler study showed mild asymmetric LVH with proximal septal thickening without LVOT obstruction. She had grade 1 diastolic dysfunction with normal systolic function, mild MR, mild TR, and aortic valve sclerosis with mild MR.   In December 2014 follow-up blood work showed normal renal function with a BUN of 19, creatinine 0.8.  She continued to have significant hyperlipidemia with a total cholesterol of 250, triglycerides 221 HDL 53, VLDL 44 and LDL cholesterol 153.  TSH was normal at 2.275.   [name]  had noticed development of claudication, particularly involving her left leg with activity.  She had episodes of shortness of breath and fatigue.  She  underwent a lower extremity arterial Doppler study which was abnormal and showed an ABI of 0.63 on the left and 1.0 on the right.  The left common iliac, external iliac, common femoral artery and profunda demonstrated multiphasic flow.  However, the superficial femoral artery on the left demonstrated occlusive disease in the proximal to mid thigh with reconstitution of flow in the mid distal segment.  The popliteal artery demonstrated patent monophasic flow with three-vessel runoff.  The anterior tibial artery demonstrated focal stenosis in the proximal calf with dampened flow distal to this.  She presented to [facility] hospital in August 2016 with complaints of palpitations, shortness of breath and presyncope.  She was noted to be in atrial fibrillation with rapid ventricular response.  Her heart rate was initially attempted to  be controlled with metoprolol and digitoxin, which was not successful and ultimately sotalol was started.  On 04/20/2015 she underwent successful TEE guided cardioversion.  She was continued on metoprolol as well as eliquis for anticoagulation.  An echo Doppler study on 04/18/2015 showed an EF of 55-60% with mild LVH.  She has multiple allergies and in the past did not tolerate ACE-inhibition or ARB therapy and was able to tolerate direct renin inhibition.  She has a history of GERD and has been taking ranitidine and sucralfate.  She has a history of hyperlipidemia and has had issues with statins.  When I saw her in October 2017 she was maintaining sinus rhythm.  She  presented to the emergency room on 01/15/2017 with chest tightness, shortness of breath and palpitations.  She was found to be back in atrial fibrillation.  She has continued to take anticoagulation with eloquence 5 mg twice a day.  During that evaluation, she underwent successful cardioversion and sinus rhythm was restored with 200 J cardioversion.   When I last saw her in May 2018, she was maintaining sinus rhythm.  I discussed the possibility of sleep apnea in the etiology of her recurrent AF but she did not want to pursue a sleep evaluation.  She underwent a follow-up echo Doppler study on 02/06/2017 which showed an EF of 60-65%.  There was moderate LVH, grade 2 diastolic dysfunction, and she was without major valvular abnormalities.  She developed recurrent atrial fibrillation with mild chest pain.  She failed 2 attempts to cardioversion in the emergency room at 200 J.  She ultimately was seen  by Dr. Rayann Heman in consultation and although her QTc was mildly prolonged, he felt that it was stable.  As result sotalol was increased to 120 mg twice a day.  2 days later on 07/10/2017 she underwent successful cardioversion on the increased dose.  She has a maintaining normal rhythm since.  She hadn't atrial fibrillation clinic follow-up on 07/17/2017  and her ECG showed sinus rhythm at 60 bpm.  She has continued to be on sotalol 120 twice a day, metoprolol 25 mg twice a day, and eliquis 5 mg twice a day for [id number] of at least 5.  Of note, upon further questioning, she has had issues recently where food gets stuck in her esophagus.  Remotely she had undergone esophageal dilatations in 2015.   When I saw her in June 2019  she denied any awareness of recurrent atrial fibrillation.  She has peripheral neuropathy of her feet.  She had undergone  laboratory by her primary physician which showed a cholesterol 229, triglycerides 340, VLDL 68, and direct LDL 150.  He had issues with numerous statins and during that evaluation I recommended a trial of Livalo and provided her with samples of 1 mg daily for 1 month with plans to titrate to 2 mg..  She subsequently was seen by [name] as well as [name] pharmacist and there was some discussion concerning PCSK9 inhibition.  However due to cost issues she preferred to continue to try the Livalo.  She has tolerated this well with improvement in her LVH but unfortunately the cost has been significant.  I last saw her in June 2020.  At that time she was taking Livalo 2 mg daily and lipid studies were significantly improved with total cholesterol 168, LDL 72, HDL 54.    I last saw her in January 2021 and since her prior evaluation she had remained stable from a cardiac standpoint.  She was unaware of any recurrent episodes of atrial fibrillation. She presented to the emergency room in August 2020 with right shoulder pain.  She had previously experienced chronic issues with the pain had exacerbated.  She was found to have several tears.  There was some discussion concerning scheduling her for right shoulder surgery with Dr. Griffin Basil.  However over the past several months, her shoulder discomfort has improved and surgery is therefore not scheduled.  When she presented to the emergency room, her blood pressure was  significantly elevated at 198/83.  She had seen her primary physician and November 2020 and at that time her blood pressure was elevated at 158/86.  At times she does note some mild ankle swelling.   Since her last evaluation, she has continued to do well.  She denies chest pain.  She is unaware of recurrent atrial fibrillation.  She continues to be on Xarelto but now that she is in the donut hole the cost is very expensive.  She has been on finasteride to potentially help with some mild hair loss.  She is on metoprolol tartrate 37.5 mg in the morning and 25 mg at night in addition to sotalol 120 mg twice a day and chlorthalidone 25 mg daily.  She has neuropathy on gabapentin.  She presents for evaluation.   Past Medical History:  Diagnosis Date  . Abnormal nuclear stress test    mild anterolateral septal and inferior ischemia  . Arthritis   . Atherosclerosis of lower extremity with claudication ([place]) 04/17/2015   LEFT LEG  . Atrial fibrillation ([place])   . Claudication ([place])   .  Colon cancer ([place])   . Coronary atherosclerosis of unspecified type of vessel, native or graft   . Esophageal stricture   . Fatty liver 11/05/11  . Hiatal hernia   . HLD (hyperlipidemia)   . Hypertension   . Iron deficiency anemia, unspecified   . Ischemic colitis ([place])   . Neuropathy   . Osteoporosis   . PAF (paroxysmal atrial fibrillation) ([place]) 04/2015  . Unspecified vascular insufficiency of intestine     Past Surgical History:  Procedure Laterality Date  . abdominoperineal resection anastomotic stricture  05/2007  . APPENDECTOMY    . CARDIAC CATHETERIZATION N/A 03/13/2015   Procedure: Left Heart Cath and Coronary Angiography;  Surgeon: Troy Sine, MD;  Location: [facility];  Service: Cardiovascular;  Laterality: N/A;  . CARDIOVERSION N/A 04/20/2015   Procedure: CARDIOVERSION;  Surgeon: Jerline Pain, MD;  Location: [facility];  Service: Cardiovascular;  Laterality: N/A;  . CARDIOVERSION N/A  07/10/2017   Procedure: CARDIOVERSION;  Surgeon: Jerline Pain, MD;  Location: [place];  Service: Cardiovascular;  Laterality: N/A;  . [place]  . ESOPHAGOGASTRODUODENOSCOPY N/A 04/27/2014   Procedure: ESOPHAGOGASTRODUODENOSCOPY (EGD);  Surgeon: Lafayette Dragon, MD;  Location: [facility];  Service: Endoscopy;  Laterality: N/A;  . Mandibular Renstruction    . ROTATOR CUFF REPAIR  2006   left  . Rt. Salpingo oophorectomy and cyst removal  2005  . SAVORY DILATION N/A 04/27/2014   Procedure: SAVORY DILATION;  Surgeon: Lafayette Dragon, MD;  Location: [facility];  Service: Endoscopy;  Laterality: N/A;  no xray needed  . sigmoid resection for invasive rectal adenocarcinoma  12/2006  . TEE WITHOUT CARDIOVERSION N/A 04/20/2015   Procedure: TRANSESOPHAGEAL ECHOCARDIOGRAM (TEE);  Surgeon: Jerline Pain, MD;  Location: [facility];  Service: Cardiovascular;  Laterality: N/A;  . TUBAL LIGATION    . WRIST SURGERY  2008   right    Allergies  Allergen Reactions  . Crestor [Rosuvastatin Calcium] Other (See Comments)    Muscle aches  . Acetaminophen-Codeine Other (See Comments)    Unknown reaction - possibly nausea  . Amlodipine Besylate Swelling    Hands and feet swelling  . Hydrocodone-Acetaminophen Nausea And Vomiting  . Irbesartan-Hydrochlorothiazide Other (See Comments)    REACTION: Dizziness  . Lisinopril Cough  . Omeprazole Nausea Only and Other (See Comments)    Dizziness, joint pain, weakness in legs, cramps in legs, stomach burned  . Propoxyphene N-Acetaminophen Other (See Comments)    Headache, and blotchy face  . Valsartan Other (See Comments)    REACTION: blurred vision  . Warfarin Sodium Other (See Comments)    REACTION: body aches and bleeding  . Diltiazem Hcl Rash  . Verapamil Nausea Only, Palpitations and Other (See Comments)    Headaches     Current Outpatient Medications  Medication Sig Dispense Refill  . acetaminophen (TYLENOL) 500 MG  tablet Take 1,000 mg by mouth every 6 (six) hours as needed for headache (pain).    . Calcium Carbonate-Vitamin D (CALCIUM-D PO) Take 1 tablet by mouth at bedtime. Calcium 630 mg, Vitamin D3 500 units    . chlorthalidone (HYGROTON) 25 MG tablet TAKE 1 TABLET EVERY DAY 90 tablet 0  . cyanocobalamin 2000 MCG tablet Take 1 tablet (2,000 mcg total) by mouth every other day. 45 tablet 1  . diclofenac sodium (VOLTAREN) 1 % GEL Apply 2 g topically 4 (four) times daily. 100 g 1  . Dietary Management Product (FOSTEUM  PLUS) CAPS TAKE ONE CAPSULE BY MOUTH TWICE DAILY 180 capsule 1  . famotidine (PEPCID) 40 MG tablet TAKE 1 TABLET (40 MG TOTAL) BY MOUTH AT BEDTIME. 90 tablet 1  . finasteride (PROPECIA) 1 MG tablet TAKE 1 TABLET AT BEDTIME 90 tablet 1  . gabapentin (NEURONTIN) 100 MG capsule TAKE 1 CAPSULE QID 360 capsule 1  . metoprolol tartrate (LOPRESSOR) 25 MG tablet Take 1 1/2(37.5 mg) tablet in the morning and 1(25 mg) tablet at bedtime 225 tablet 3  . Pitavastatin Calcium (LIVALO) 2 MG TABS Take 1 tablet (2 mg total) by mouth daily. 90 tablet 1  . potassium chloride (KLOR-CON) 10 MEQ tablet TAKE 2 TABLETS (20 MEQ TOTAL) BY MOUTH DAILY (DOSE INCREASED DUE TO HYPOKALEMIA) 180 tablet 1  . sotalol (BETAPACE) 120 MG tablet Take 1 tablet (120 mg total) by mouth every 12 (twelve) hours. 180 tablet 3  . traMADol (ULTRAM) 50 MG tablet Take 1 tablet (50 mg total) by mouth every 6 (six) hours as needed. 90 tablet 5  . XARELTO 20 MG TABS tablet TAKE 1 TABLET EVERY DAY WITH SUPPER 90 tablet 1   No current facility-administered medications for this visit.    Social History   Socioeconomic History  . Marital status: Widowed    Spouse name: Not on file  . Number of children: 4  . Years of education: 12th grade  . Highest education level: Not on file  Occupational History  . Occupation: retired    Fish farm manager: RETIRED  Tobacco Use  . Smoking status: Never Smoker  . Smokeless tobacco: Never Used  Vaping Use    . Vaping Use: Never used  Substance and Sexual Activity  . Alcohol use: No  . Drug use: No  . Sexual activity: Not Currently  Other Topics Concern  . Not on file  Social History Narrative   Lives at home with fiance.   Right-handed.   No caffeine use.   Social Determinants of Health   Financial Resource Strain:   . Difficulty of Paying Living Expenses: Not on file  Food Insecurity:   . Worried About Charity fundraiser in the Last Year: Not on file  . Ran Out of Food in the Last Year: Not on file  Transportation Needs:   . Lack of Transportation (Medical): Not on file  . Lack of Transportation (Non-Medical): Not on file  Physical Activity:   . Days of Exercise per Week: Not on file  . Minutes of Exercise per Session: Not on file  Stress:   . Feeling of Stress : Not on file  Social Connections:   . Frequency of Communication with Friends and Family: Not on file  . Frequency of Social Gatherings with Friends and Family: Not on file  . Attends Religious Services: Not on file  . Active Member of Clubs or Organizations: Not on file  . Attends Archivist Meetings: Not on file  . Marital Status: Not on file  Intimate Partner Violence:   . Fear of Current or Ex-Partner: Not on file  . Emotionally Abused: Not on file  . Physically Abused: Not on file  . Sexually Abused: Not on file    Family History  Problem Relation Age of Onset  . Colon cancer Father 43  . Heart disease Father   . Hypertension Father   . Alzheimer's disease Mother   . Thyroid disease Mother   . Hypertension Sister   . Thyroid disease Sister   . Mitral  valve prolapse Sister   . Hypertension Son   . Diabetes Son   . Hypertension Son   . Diabetes Son   . Esophageal cancer Neg Hx   . Rectal cancer Neg Hx   . Stomach cancer Neg Hx    Social history  is notable that she is widowed. She has 3 children and 9 grandchildren. She remains active. There is no alcohol tobacco use.   ROS General:  Negative; No fevers, chills, or night sweats; positive for fatigue and shortness of breath HEENT: Negative; No changes in vision or hearing, sinus congestion, difficulty swallowing Pulmonary: Negative; No cough, wheezing, hemoptysis Cardiovascular: Positive for PAF, status post cardioversion Positive for left leg claudication GI: Positive for GERD; dysphagia GU: Negative; No dysuria, hematuria, or difficulty voiding Musculoskeletal: Right shoulder muscle tears Hematologic/Oncology: Negative; no easy bruising, bleeding Endocrine: Negative; no heat/cold intolerance; no diabetes Neuro: Positive for peripheral neuropathy Skin: Negative; No rashes or skin lesions Psychiatric: Negative; No behavioral problems, depression Sleep: Negative; No snoring, daytime sleepiness, hypersomnolence, bruxism, restless legs, hypnogognic hallucinations, no cataplexy Other comprehensive 14 point system review is negative.   PE BP 140/76   Pulse (!) 55   Temp (!) 97.4 F (36.3 C)   Ht _0  (1.676 m)   Wt 198 lb (89.8 kg)   SpO2 96%   BMI 31.96 kg/m    Repeat blood pressure by me was 136/78  Wt Readings from Last 3 Encounters:  05/25/20 198 lb (89.8 kg)  01/26/20 208 lb (94.3 kg)  09/14/19 206 lb 9.6 oz (93.7 kg)   General: Alert, oriented, no distress.  Skin: normal turgor, no rashes, warm and dry HEENT: Normocephalic, atraumatic. Pupils equal round and reactive to light; sclera anicteric; extraocular muscles intact; Nose without nasal septal hypertrophy Mouth/Parynx benign; Mallinpatti scale 3 Neck: No JVD, no carotid bruits; normal carotid upstroke Lungs: clear to ausculatation and percussion; no wheezing or rales Chest wall: without tenderness to palpitation Heart: PMI not displaced, RRR, s1 s2 normal, 1/6 systolic murmur, no diastolic murmur, no rubs, gallops, thrills, or heaves Abdomen: soft, nontender; no hepatosplenomehaly, BS+; abdominal aorta nontender and not dilated by  palpation. Back: no CVA tenderness Pulses 2+ Musculoskeletal: full range of motion, normal strength, no joint deformities Extremities: Trivial right ankle swelling no clubbing cyanosis , Homan's sign negative  Neurologic: grossly nonfocal; Cranial nerves grossly wnl Psychologic: Normal mood and affect   ECG (independently read by me): Probable low atrial rhythm with negative P waves in leads II and III. Heart rate 55 bpm. No ectopy.  QTc interval 470 ms.  January 2021 ECG (independently read by me): Normal sinus rhythm at 61 bpm.  PR interval 170 ms, QTc interval 479 ms  June 2020 ECG (independently read by me): Sinus bradycardia at 52 bpm.  QTc interval 453 ms on sotalol;  no significant ST-T changes  June 2019 ECG (independently read by me): Sinus bradycardia 56 bpm.  QTc interval 474 ms.  December 2018 ECG (independently read by me): Normal sinus rhythm at 60 bpm.  QTc interval 454 ms.  No ST segment changes.  October 2018 ECG (independently read by me): Normal sinus rhythm at 62 bpm.  No significant ST-T changes.  No ectopy.  QTc interval 452 ms.  May 2018 ECG (independently read by me): Sinus bradycardia 59 bpm.  Low voltage.  Normal intervals.  No ST segment changes.  October 2017 ECG (independently read by me): Sinus bradycardia 57 bpm.  No ectopy.  Normal intervals.  November 2016 ECG (independently read by me): Sinus bradycardia 59 bpm.  No ectopy.  Normal intervals.  QTC 457 ms.  05/22/2015 ECG (independently read by me): Normal sinus rhythm at 60 bpm.  QTc interval 454 ms.  No significant ST segment changes.  June 2016 ECG (independently read by me): Normal sinus rhythm at 63 bpm.  Poor precordial R-wave progression.  No ectopy.  November 2015 ECG (independently read by me);  Normal sinus rhythm at 59 bpm.  QTc interval 415 ms.  No significant ST segment changes.  Prior November 2014ECG: Sinus rhythm at 63 beats per minute. Normal intervals.  LABS: BMP Latest Ref Rng &  Units 01/26/2020 09/14/2019 02/28/2019  Glucose 70 - 99 mg/dL 99 106(H) 98  BUN 6 - 23 mg/dL _0 Creatinine 0.40 - 1.20 mg/dL 0.86 0.87 0.94  BUN/Creat Ratio 12 - 28 - 22 19  Sodium 135 - 145 mEq/L 141 139 142  Potassium 3.5 - 5.1 mEq/L 3.6 3.9 3.9  Chloride 96 - 112 mEq/L 104 97 102  CO2 19 - 32 mEq/L _1 Calcium 8.4 - 10.5 mg/dL 9.7 9.8 10.0   Hepatic Function Latest Ref Rng & Units 09/14/2019 02/28/2019 03/30/2018  Total Protein 6.0 - 8.5 g/dL 6.8 6.7 6.9  Albumin 3.6 - 4.6 g/dL 4.4 4.4 4.3  AST 0 - 40 IU/L _2 ALT 0 - 32 IU/L _3 Alk Phosphatase 39 - 117 IU/L 56 46 50  Total Bilirubin 0.0 - 1.2 mg/dL 0.9 1.0 1.1  Bilirubin, Direct 0.00 - 0.40 mg/dL - - 0.23   CBC Latest Ref Rng & Units 01/26/2020 09/14/2019 02/28/2019  WBC 4.0 - 10.5 K/uL 4.6 4.7 4.5  Hemoglobin 12.0 - 15.0 g/dL 12.5 13.4 13.5  Hematocrit 36 - 46 % 38.0 41.9 42.7  Platelets 150 - 400 K/uL 188.0 193 177   Lab Results  Component Value Date   MCV 85.1 01/26/2020   MCV 88 09/14/2019   MCV 83 02/28/2019   Lab Results  Component Value Date   TSH 2.350 09/14/2019   Lab Results  Component Value Date   HGBA1C 5.5 12/18/2016   Lipid Panel     Component Value Date/Time   CHOL 184 09/14/2019 0944   TRIG 183 (H) 09/14/2019 0944   HDL 54 09/14/2019 0944   CHOLHDL 3.4 09/14/2019 0944   CHOLHDL 5 12/28/2017 1340   VLDL 68.0 (H) 12/28/2017 1340   LDLCALC 99 09/14/2019 0944   LDLDIRECT 150.0 12/28/2017 1340    RADIOLOGY: US Breast Right  07/04/2013   CLINICAL DATA:  Abnormal screening right mammogram.  EXAM: DIGITAL DIAGNOSTIC  right MAMMOGRAM  ULTRASOUND right BREAST  COMPARISON:  With prior exams.  ACR Breast Density Category b: There are scattered areas of fibroglandular density.  FINDINGS: Spot compression views of the lateral aspect of the right breast were performed. There is persistence of a 4 mm low-density nodule in the posterior 3rd of the breast. There are no malignant type  microcalcifications.  On physical exam I do not palpate a mass in the right breast.  Ultrasound is performed, showing there is a near anechoic lesion in the right breast at 7 o'clock 8 cm from the nipple measuring 3 x 2 x 3 mm.  IMPRESSION: Probable benign lesion in the right breast.  RECOMMENDATION: Short-term interval followup right mammogram and ultrasound in 6 months is recommended to document stability.  I have discussed the findings and recommendations with  the patient. Results were also provided in writing at the conclusion of the visit. If applicable, a reminder letter will be sent to the patient regarding the next appointment.  BI-RADS CATEGORY  3: Probably benign finding(s) - short interval follow-up suggested.   Electronically Signed   By: Lillia Mountain M.D.   On: 07/04/2013 09:01   Mm [place] R  07/04/2013   CLINICAL DATA:  Abnormal screening right mammogram.  EXAM: DIGITAL DIAGNOSTIC  right MAMMOGRAM  ULTRASOUND right BREAST  COMPARISON:  With prior exams.  ACR Breast Density Category b: There are scattered areas of fibroglandular density.  FINDINGS: Spot compression views of the lateral aspect of the right breast were performed. There is persistence of a 4 mm low-density nodule in the posterior 3rd of the breast. There are no malignant type microcalcifications.  On physical exam I do not palpate a mass in the right breast.  Ultrasound is performed, showing there is a near anechoic lesion in the right breast at 7 o'clock 8 cm from the nipple measuring 3 x 2 x 3 mm.  IMPRESSION: Probable benign lesion in the right breast.  RECOMMENDATION: Short-term interval followup right mammogram and ultrasound in 6 months is recommended to document stability.  I have discussed the findings and recommendations with the patient. Results were also provided in writing at the conclusion of the visit. If applicable, a reminder letter will be sent to the patient regarding the next appointment.  BI-RADS CATEGORY   3: Probably benign finding(s) - short interval follow-up suggested.   Electronically Signed   By: Lillia Mountain M.D.   On: 07/04/2013 09:01   IMPRESSION:  1. Paroxysmal atrial fibrillation (HCC)   2. Essential hypertension   3. Dyslipidemia, goal LDL below 70   4. Anticoagulation adequate   5. PVD (peripheral vascular disease) (HCC)      ASSESSMENT AND PLAN: Ms. Camerer is a young appearing 86 year old female who has a history of hypertension, paroxysmal atrial fibrillation,  hyperlipidemia and peripheral vascular disease. She  is on Xarelto for anticoagulation and tolerating this well.  She developed recurrent AF and underwent successful cardioversion during her emergency room evaluation on 01/15/2017.  She developed recurrent A. fib despite taking metoprolol and sotalol 80 mg twice a day.  She had 2 failed attempts in the emergency room in early November when she presented with AF with RVR.  Ultimately, her sotalol dose was increased to 120 twice a day and she was able to be successfully cardioverted.  Subsequently, she has done well has been without recurrent AF on her increased sotalol dose in addition to metoprolol.  QTc interval today is 470 ms.  Her last echo has demonstrated normal systolic function, moderate LVH with grade 2 diastolic dysfunction.  She has had blood pressure lability noted on several recent evaluations.  She is currently on sotalol 120 mg twice a day and metoprolol tartrate 25 mg twice a day and is unaware of any recurrent atrial fibrillation.  In addition she is on chlorthalidone 25 mg for hypertension as well.  She is anticoagulated on Xarelto.  I will try to see if we have samples today help her through the donut hole.  She has been on Livalo for hyperlipidemia and has been trying to obtain samples of this as well.  Recent laboratory was reviewed.  Creatinine in May was 0.86.  LDL cholesterol in January 2021 was 99.  She has previously documented peripheral vascular disease with  reduced ABI on the  left with occlusive disease in the proximal to mid left superficial femoral artery with monophasic flow and three-vessel runoff in the popliteal and anterior tibial arteries.  She denies any recent claudication symptoms.  She will continue current therapy.  I will see her in 1 year for follow-up evaluation.      Time spent: 25 minutes Troy Sine, MD, [facility]  05/25/2020 8:41 PM

## 2020-06-25 ENCOUNTER — Other Ambulatory Visit: Payer: Self-pay | Admitting: Cardiovascular Disease

## 2020-06-25 DIAGNOSIS — I480 Paroxysmal atrial fibrillation: Secondary | ICD-10-CM

## 2020-06-26 ENCOUNTER — Other Ambulatory Visit: Payer: Self-pay | Admitting: Internal Medicine

## 2020-07-04 ENCOUNTER — Other Ambulatory Visit: Payer: Self-pay | Admitting: Cardiovascular Disease

## 2020-07-14 ENCOUNTER — Other Ambulatory Visit: Payer: Self-pay | Admitting: Internal Medicine

## 2020-07-14 DIAGNOSIS — I10 Essential (primary) hypertension: Secondary | ICD-10-CM

## 2020-07-18 ENCOUNTER — Telehealth: Payer: Self-pay | Admitting: Internal Medicine

## 2020-07-18 NOTE — Telephone Encounter (Signed)
LVM for pt to rtn my call to schedule awv with nha. Please schedule this appt if pt calls the office.   Thanks, [phone]

## 2020-07-23 ENCOUNTER — Other Ambulatory Visit: Payer: Self-pay

## 2020-07-23 ENCOUNTER — Ambulatory Visit (INDEPENDENT_AMBULATORY_CARE_PROVIDER_SITE_OTHER): Payer: Medicare HMO | Admitting: Internal Medicine

## 2020-07-23 ENCOUNTER — Encounter: Payer: Self-pay | Admitting: Internal Medicine

## 2020-07-23 VITALS — BP 134/84 | HR 57 | Temp 98.0°F | Ht 66.0 in | Wt 195.0 lb

## 2020-07-23 DIAGNOSIS — I10 Essential (primary) hypertension: Secondary | ICD-10-CM | POA: Diagnosis not present

## 2020-07-23 DIAGNOSIS — E785 Hyperlipidemia, unspecified: Secondary | ICD-10-CM | POA: Diagnosis not present

## 2020-07-23 DIAGNOSIS — I480 Paroxysmal atrial fibrillation: Secondary | ICD-10-CM | POA: Diagnosis not present

## 2020-07-23 LAB — BASIC METABOLIC PANEL
BUN: 21 mg/dL (ref 6–23)
CO2: 32 mEq/L (ref 19–32)
Calcium: 10.4 mg/dL (ref 8.4–10.5)
Chloride: 101 mEq/L (ref 96–112)
Creatinine, Ser: 0.9 mg/dL (ref 0.40–1.20)
GFR: 58.25 mL/min — ABNORMAL LOW (ref >60.00)
Glucose, Bld: 104 mg/dL — ABNORMAL HIGH (ref 70–99)
Potassium: 4.2 mEq/L (ref 3.5–5.1)
Sodium: 141 mEq/L (ref 135–145)

## 2020-07-23 LAB — LIPID PANEL
Cholesterol: 175 mg/dL (ref 0–200)
HDL: 51.6 mg/dL (ref >39.00)
LDL Cholesterol: 86 mg/dL (ref 0–99)
NonHDL: 123.04
Total CHOL/HDL Ratio: 3
Triglycerides: 187 mg/dL — ABNORMAL HIGH (ref 0.0–149.0)
VLDL: 37.4 mg/dL (ref 0.0–40.0)

## 2020-07-23 LAB — TSH: TSH: 1.35 u[IU]/mL (ref 0.35–4.50)

## 2020-07-23 NOTE — Progress Notes (Addendum)
Subjective:  Patient ID: [name], female    DOB: May 26, 1933  Age: 86 y.o. MRN: [id number]  CC: Hypertension and Atrial Fibrillation  This visit occurred during the SARS-CoV-2 [facility] emergency.  Safety protocols were in place, including screening questions prior to the visit, additional usage of staff PPE, and extensive cleaning of exam room while observing appropriate contact time as indicated for disinfecting solutions.    HPI [name] presents for f/up - She is active and denies any recent episodes of palpitations, CP, SOB, DOE, edema , fatgiue. She refuses all vaccines today.  Outpatient Medications Prior to Visit  Medication Sig Dispense Refill  . acetaminophen (TYLENOL) 500 MG tablet Take 1,000 mg by mouth every 6 (six) hours as needed for headache (pain).    . Calcium Carbonate-Vitamin D (CALCIUM-D PO) Take 1 tablet by mouth at bedtime. Calcium 630 mg, Vitamin D3 500 units    . chlorthalidone (HYGROTON) 25 MG tablet TAKE 1 TABLET EVERY DAY 90 tablet 0  . cyanocobalamin 2000 MCG tablet Take 1 tablet (2,000 mcg total) by mouth every other day. 45 tablet 1  . diclofenac sodium (VOLTAREN) 1 % GEL Apply 2 g topically 4 (four) times daily. 100 g 1  . Dietary Management Product (FOSTEUM PLUS) CAPS TAKE ONE CAPSULE BY MOUTH TWICE DAILY 180 capsule 1  . famotidine (PEPCID) 40 MG tablet TAKE 1 TABLET (40 MG TOTAL) BY MOUTH AT BEDTIME. 90 tablet 1  . finasteride (PROPECIA) 1 MG tablet TAKE 1 TABLET AT BEDTIME 90 tablet 1  . metoprolol tartrate (LOPRESSOR) 25 MG tablet Take 1 1/2(37.5 mg) tablet in the morning and 1(25 mg) tablet at bedtime 225 tablet 3  . Pitavastatin Calcium (LIVALO) 2 MG TABS Take 1 tablet (2 mg total) by mouth daily. 90 tablet 1  . potassium chloride (KLOR-CON) 10 MEQ tablet TAKE 2 TABLETS DAILY (DOSE INCREASED DUE TO HYPOKALEMIA) 180 tablet 1  . sotalol (BETAPACE) 120 MG tablet TAKE 1 TABLET EVERY 12 HOURS 180 tablet 3  . traMADol (ULTRAM) 50 MG tablet  Take 1 tablet (50 mg total) by mouth every 6 (six) hours as needed. 90 tablet 5  . XARELTO 20 MG TABS tablet TAKE 1 TABLET EVERY DAY WITH SUPPER 90 tablet 1  . gabapentin (NEURONTIN) 100 MG capsule TAKE 1 CAPSULE QID 360 capsule 1   No facility-administered medications prior to visit.    ROS Review of Systems  Constitutional: Negative.  Negative for diaphoresis and fatigue.  HENT: Negative.   Eyes: Negative.   Respiratory: Negative for cough, chest tightness, shortness of breath and wheezing.   Cardiovascular: Negative for chest pain, palpitations and leg swelling.  Gastrointestinal: Negative for abdominal pain, constipation, diarrhea, nausea and vomiting.  Genitourinary: Negative.  Negative for difficulty urinating.  Musculoskeletal: Positive for arthralgias. Negative for myalgias.  Skin: Negative.  Negative for color change and pallor.  Neurological: Negative.  Negative for dizziness and weakness.  Hematological: Negative for adenopathy. Does not bruise/bleed easily.  Psychiatric/Behavioral: Negative.     Objective:  BP 134/84   Pulse (!) 57   Temp 98 F (36.7 C) (Oral)   Ht 5\' 6"  (1.676 m)   Wt 195 lb (88.5 kg)   SpO2 98%   BMI 31.47 kg/m   BP Readings from Last 3 Encounters:  07/23/20 134/84  05/25/20 140/76  01/26/20 (!) 152/82    Wt Readings from Last 3 Encounters:  07/23/20 195 lb (88.5 kg)  05/25/20 198 lb (89.8 kg)  01/26/20 208 lb (94.3 kg)    Physical Exam Vitals reviewed.  Constitutional:      Appearance: Normal appearance.  HENT:     Nose: Nose normal.     Mouth/Throat:     Mouth: Mucous membranes are moist.  Eyes:     General: No scleral icterus.    Conjunctiva/sclera: Conjunctivae normal.  Cardiovascular:     Rate and Rhythm: Normal rate and regular rhythm.     Heart sounds: No murmur heard.   Pulmonary:     Effort: Pulmonary effort is normal.     Breath sounds: No stridor. No wheezing, rhonchi or rales.  Abdominal:     General: Abdomen  is flat. Bowel sounds are normal. There is no distension.     Palpations: Abdomen is soft. There is no hepatomegaly, splenomegaly or mass.     Tenderness: There is no abdominal tenderness.  Musculoskeletal:        General: Normal range of motion.     Cervical back: Neck supple.     Right lower leg: No edema.     Left lower leg: No edema.  Lymphadenopathy:     Cervical: No cervical adenopathy.  Skin:    General: Skin is warm and dry.     Coloration: Skin is not pale.  Neurological:     General: No focal deficit present.     Mental Status: She is alert.  Psychiatric:        Mood and Affect: Mood normal.        Behavior: Behavior normal.     Lab Results  Component Value Date   WBC 4.6 01/26/2020   HGB 12.5 01/26/2020   HCT 38.0 01/26/2020   PLT 188.0 01/26/2020   GLUCOSE 104 (H) 07/23/2020   CHOL 175 07/23/2020   TRIG 187.0 (H) 07/23/2020   HDL 51.60 07/23/2020   LDLDIRECT 150.0 12/28/2017   LDLCALC 86 07/23/2020   ALT 15 09/14/2019   AST 21 09/14/2019   NA 141 07/23/2020   K 4.2 07/23/2020   CL 101 07/23/2020   CREATININE 0.90 07/23/2020   BUN 21 07/23/2020   CO2 32 07/23/2020   TSH 1.35 07/23/2020   INR 1.02 07/07/2017   HGBA1C 5.5 12/18/2016    CT SHOULDER RIGHT WO CONTRAST  Result Date: 06/06/2019 CLINICAL DATA:  Chronic right shoulder pain. EXAM: CT OF THE UPPER RIGHT EXTREMITY WITHOUT CONTRAST TECHNIQUE: Multidetector CT imaging of the upper right extremity was performed according to the standard protocol. COMPARISON:  Right shoulder x-rays dated April 20, 2019. MRI right shoulder dated September 18, 2014. FINDINGS: Bones/Joint/Cartilage No fracture or dislocation. Normal alignment. Small joint effusion. Mild osteoarthritis of the glenohumeral joint with mild joint space narrowing and marginal osteophytosis. Chondrocalcinosis. Moderate arthropathy of the acromioclavicular joint. Type II acromion. Ligaments Ligaments are suboptimally evaluated by CT. Muscles and  Tendons Moderate infraspinatus and mild supraspinatus muscle atrophy. Peritendinous calcification along the subscapularis myotendinous junction. Soft tissue No fluid collection or hematoma.  No soft tissue mass. IMPRESSION: 1. Mild glenohumeral osteoarthritis. Chondrocalcinosis could reflect underlying CPPD arthropathy. 2. Moderate infraspinatus and mild supraspinatus muscle atrophy related to underlying full-thickness tendon tears seen on prior MRI from January 2016. 3. Moderate acromioclavicular osteoarthritis. Electronically Signed   By: Titus Dubin M.D.   On: 06/06/2019 19:43    Assessment & Plan:   Eddith was seen today for hypertension and atrial fibrillation.  Diagnoses and all orders for this visit:  Primary hypertension- Her BP is well controlled. -  Basic metabolic panel; Future -     Basic metabolic panel  Paroxysmal atrial fibrillation ([place])- She has good rate/rhythm control. Will continue the DOAC. -     Basic metabolic panel; Future -     TSH; Future -     TSH -     Basic metabolic panel  Dyslipidemia, goal LDL below 100- She has achieved her LDL goal and is doing well on the statin. -     Lipid panel; Future -     TSH; Future -     TSH -     Lipid panel   I have discontinued Orlean Patten. [place]'s gabapentin. I am also having her maintain her Calcium Carbonate-Vitamin D (CALCIUM-D PO), acetaminophen, Livalo, diclofenac sodium, famotidine, metoprolol tartrate, traMADol, cyanocobalamin, Xarelto, finasteride, sotalol, Fosteum Plus, potassium chloride, and chlorthalidone.  No orders of the defined types were placed in this encounter.    Follow-up: Return in about 6 months (around 01/20/2021).  [name], MD

## 2020-07-23 NOTE — Patient Instructions (Signed)

## 2020-08-22 ENCOUNTER — Other Ambulatory Visit: Payer: Self-pay | Admitting: Internal Medicine

## 2020-08-22 DIAGNOSIS — L659 Nonscarring hair loss, unspecified: Secondary | ICD-10-CM

## 2020-09-05 DIAGNOSIS — H90A32 Mixed conductive and sensorineural hearing loss, unilateral, left ear with restricted hearing on the contralateral side: Secondary | ICD-10-CM | POA: Diagnosis not present

## 2020-09-05 DIAGNOSIS — H90A21 Sensorineural hearing loss, unilateral, right ear, with restricted hearing on the contralateral side: Secondary | ICD-10-CM | POA: Diagnosis not present

## 2020-09-24 ENCOUNTER — Other Ambulatory Visit: Payer: Self-pay | Admitting: Internal Medicine

## 2020-09-24 DIAGNOSIS — I10 Essential (primary) hypertension: Secondary | ICD-10-CM

## 2020-11-26 DIAGNOSIS — H2513 Age-related nuclear cataract, bilateral: Secondary | ICD-10-CM | POA: Diagnosis not present

## 2020-12-03 ENCOUNTER — Other Ambulatory Visit: Payer: Self-pay | Admitting: Internal Medicine

## 2020-12-03 DIAGNOSIS — I480 Paroxysmal atrial fibrillation: Secondary | ICD-10-CM

## 2020-12-03 DIAGNOSIS — I10 Essential (primary) hypertension: Secondary | ICD-10-CM

## 2020-12-20 ENCOUNTER — Other Ambulatory Visit: Payer: Self-pay | Admitting: Internal Medicine

## 2020-12-20 DIAGNOSIS — M159 Polyosteoarthritis, unspecified: Secondary | ICD-10-CM

## 2020-12-20 DIAGNOSIS — M8949 Other hypertrophic osteoarthropathy, multiple sites: Secondary | ICD-10-CM

## 2020-12-31 ENCOUNTER — Other Ambulatory Visit: Payer: Self-pay | Admitting: Cardiovascular Disease

## 2020-12-31 DIAGNOSIS — I480 Paroxysmal atrial fibrillation: Secondary | ICD-10-CM

## 2020-12-31 DIAGNOSIS — I10 Essential (primary) hypertension: Secondary | ICD-10-CM

## 2021-02-01 DIAGNOSIS — H25811 Combined forms of age-related cataract, right eye: Secondary | ICD-10-CM | POA: Diagnosis not present

## 2021-02-11 ENCOUNTER — Other Ambulatory Visit: Payer: Self-pay | Admitting: Internal Medicine

## 2021-02-11 DIAGNOSIS — I10 Essential (primary) hypertension: Secondary | ICD-10-CM

## 2021-02-12 DIAGNOSIS — H2512 Age-related nuclear cataract, left eye: Secondary | ICD-10-CM | POA: Diagnosis not present

## 2021-02-15 DIAGNOSIS — H2181 Floppy iris syndrome: Secondary | ICD-10-CM | POA: Diagnosis not present

## 2021-02-15 DIAGNOSIS — H25812 Combined forms of age-related cataract, left eye: Secondary | ICD-10-CM | POA: Diagnosis not present

## 2021-02-26 ENCOUNTER — Other Ambulatory Visit: Payer: Self-pay | Admitting: Internal Medicine

## 2021-02-26 DIAGNOSIS — I10 Essential (primary) hypertension: Secondary | ICD-10-CM

## 2021-03-13 ENCOUNTER — Other Ambulatory Visit: Payer: Self-pay | Admitting: Internal Medicine

## 2021-03-13 DIAGNOSIS — I10 Essential (primary) hypertension: Secondary | ICD-10-CM

## 2021-03-19 DIAGNOSIS — Z0100 Encounter for examination of eyes and vision without abnormal findings: Secondary | ICD-10-CM | POA: Diagnosis not present

## 2021-03-19 DIAGNOSIS — H524 Presbyopia: Secondary | ICD-10-CM | POA: Diagnosis not present

## 2021-03-19 DIAGNOSIS — Z961 Presence of intraocular lens: Secondary | ICD-10-CM | POA: Diagnosis not present

## 2021-03-19 DIAGNOSIS — H5213 Myopia, bilateral: Secondary | ICD-10-CM | POA: Diagnosis not present

## 2021-05-02 ENCOUNTER — Other Ambulatory Visit: Payer: Self-pay | Admitting: Internal Medicine

## 2021-05-02 ENCOUNTER — Other Ambulatory Visit: Payer: Self-pay | Admitting: Cardiovascular Disease

## 2021-05-02 DIAGNOSIS — I480 Paroxysmal atrial fibrillation: Secondary | ICD-10-CM

## 2021-05-13 ENCOUNTER — Other Ambulatory Visit: Payer: Self-pay | Admitting: Cardiovascular Disease

## 2021-05-13 DIAGNOSIS — I480 Paroxysmal atrial fibrillation: Secondary | ICD-10-CM

## 2021-05-13 DIAGNOSIS — I10 Essential (primary) hypertension: Secondary | ICD-10-CM

## 2021-05-15 ENCOUNTER — Ambulatory Visit (INDEPENDENT_AMBULATORY_CARE_PROVIDER_SITE_OTHER): Payer: Medicare HMO | Admitting: Internal Medicine

## 2021-05-15 ENCOUNTER — Encounter: Payer: Self-pay | Admitting: Internal Medicine

## 2021-05-15 ENCOUNTER — Other Ambulatory Visit: Payer: Self-pay

## 2021-05-15 VITALS — BP 144/72 | HR 62 | Temp 98.2°F | Ht 66.0 in | Wt 188.0 lb

## 2021-05-15 DIAGNOSIS — I10 Essential (primary) hypertension: Secondary | ICD-10-CM

## 2021-05-15 DIAGNOSIS — E785 Hyperlipidemia, unspecified: Secondary | ICD-10-CM | POA: Diagnosis not present

## 2021-05-15 DIAGNOSIS — Z0000 Encounter for general adult medical examination without abnormal findings: Secondary | ICD-10-CM | POA: Diagnosis not present

## 2021-05-15 DIAGNOSIS — I480 Paroxysmal atrial fibrillation: Secondary | ICD-10-CM

## 2021-05-15 DIAGNOSIS — Z0001 Encounter for general adult medical examination with abnormal findings: Secondary | ICD-10-CM | POA: Insufficient documentation

## 2021-05-15 LAB — BASIC METABOLIC PANEL
BUN: 19 mg/dL (ref 6–23)
CO2: 30 mEq/L (ref 19–32)
Calcium: 10.4 mg/dL (ref 8.4–10.5)
Chloride: 102 mEq/L (ref 96–112)
Creatinine, Ser: 0.85 mg/dL (ref 0.40–1.20)
GFR: 62.03 mL/min (ref >60.00)
Glucose, Bld: 94 mg/dL (ref 70–99)
Potassium: 3.5 mEq/L (ref 3.5–5.1)
Sodium: 140 mEq/L (ref 135–145)

## 2021-05-15 LAB — HEPATIC FUNCTION PANEL
ALT: 14 U/L (ref 0–35)
AST: 20 U/L (ref 0–37)
Albumin: 4.1 g/dL (ref 3.5–5.2)
Alkaline Phosphatase: 45 U/L (ref 39–117)
Bilirubin, Direct: 0.2 mg/dL (ref 0.0–0.3)
Total Bilirubin: 1.2 mg/dL (ref 0.2–1.2)
Total Protein: 7.2 g/dL (ref 6.0–8.3)

## 2021-05-15 LAB — TSH: TSH: 1.39 u[IU]/mL (ref 0.35–5.50)

## 2021-05-15 LAB — CBC WITH DIFFERENTIAL/PLATELET
Basophils Absolute: 0 10*3/uL (ref 0.0–0.1)
Basophils Relative: 0.8 % (ref 0.0–3.0)
Eosinophils Absolute: 0.2 10*3/uL (ref 0.0–0.7)
Eosinophils Relative: 3.5 % (ref 0.0–5.0)
HCT: 42.8 % (ref 36.0–46.0)
Hemoglobin: 14.3 g/dL (ref 12.0–15.0)
Lymphocytes Relative: 35 % (ref 12.0–46.0)
Lymphs Abs: 2 10*3/uL (ref 0.7–4.0)
MCHC: 33.4 g/dL (ref 30.0–36.0)
MCV: 89.9 fl (ref 78.0–100.0)
Monocytes Absolute: 0.6 10*3/uL (ref 0.1–1.0)
Monocytes Relative: 10 % (ref 3.0–12.0)
Neutro Abs: 2.8 10*3/uL (ref 1.4–7.7)
Neutrophils Relative %: 50.7 % (ref 43.0–77.0)
Platelets: 177 10*3/uL (ref 150.0–400.0)
RBC: 4.77 Mil/uL (ref 3.87–5.11)
RDW: 13.6 % (ref 11.5–15.5)
WBC: 5.6 10*3/uL (ref 4.0–10.5)

## 2021-05-15 LAB — LIPID PANEL
Cholesterol: 180 mg/dL (ref 0–200)
HDL: 53.8 mg/dL (ref >39.00)
LDL Cholesterol: 95 mg/dL (ref 0–99)
NonHDL: 126.41
Total CHOL/HDL Ratio: 3
Triglycerides: 159 mg/dL — ABNORMAL HIGH (ref 0.0–149.0)
VLDL: 31.8 mg/dL (ref 0.0–40.0)

## 2021-05-15 MED ORDER — CHLORTHALIDONE 25 MG PO TABS: 25.0000 mg | ORAL_TABLET | Freq: Every day | ORAL | 0 refills | Status: DC

## 2021-05-15 MED ORDER — LIVALO 2 MG PO TABS: 2.0000 mg | ORAL_TABLET | Freq: Every day | ORAL | 1 refills | Status: DC

## 2021-05-15 MED ORDER — RIVAROXABAN 20 MG PO TABS: 20.0000 mg | ORAL_TABLET | Freq: Every day | ORAL | 1 refills | Status: DC

## 2021-05-15 NOTE — Patient Instructions (Signed)
Health Maintenance, Female Adopting a healthy lifestyle and getting preventive care are important in promoting health and wellness. Ask your health care provider about: The right schedule for you to have regular tests and exams. Things you can do on your own to prevent diseases and keep yourself healthy. What should I know about diet, weight, and exercise? Eat a healthy diet  Eat a diet that includes plenty of vegetables, fruits, low-fat dairy products, and lean protein. Do not eat a lot of foods that are high in solid fats, added sugars, or sodium. Maintain a healthy weight Body mass index (BMI) is used to identify weight problems. It estimates body fat based on height and weight. Your health care provider can help determine your BMI and help you achieve or maintain a healthy weight. Get regular exercise Get regular exercise. This is one of the most important things you can do for your health. Most adults should: Exercise for at least 150 minutes each week. The exercise should increase your heart rate and make you sweat (moderate-intensity exercise). Do strengthening exercises at least twice a week. This is in addition to the moderate-intensity exercise. Spend less time sitting. Even light physical activity can be beneficial. Watch cholesterol and blood lipids Have your blood tested for lipids and cholesterol at 85 years of age, then have this test every 5 years. Have your cholesterol levels checked more often if: Your lipid or cholesterol levels are high. You are older than 85 years of age. You are at high risk for heart disease. What should I know about cancer screening? Depending on your health history and family history, you may need to have cancer screening at various ages. This may include screening for: Breast cancer. Cervical cancer. Colorectal cancer. Skin cancer. Lung cancer. What should I know about heart disease, diabetes, and high blood pressure? Blood pressure and heart  disease High blood pressure causes heart disease and increases the risk of stroke. This is more likely to develop in people who have high blood pressure readings, are of African descent, or are overweight. Have your blood pressure checked: Every 3-5 years if you are 18-39 years of age. Every year if you are 40 years old or older. Diabetes Have regular diabetes screenings. This checks your fasting blood sugar level. Have the screening done: Once every three years after age 40 if you are at a normal weight and have a low risk for diabetes. More often and at a younger age if you are overweight or have a high risk for diabetes. What should I know about preventing infection? Hepatitis B If you have a higher risk for hepatitis B, you should be screened for this virus. Talk with your health care provider to find out if you are at risk for hepatitis B infection. Hepatitis C Testing is recommended for: Everyone born from 1945 through 1965. Anyone with known risk factors for hepatitis C. Sexually transmitted infections (STIs) Get screened for STIs, including gonorrhea and chlamydia, if: You are sexually active and are younger than 85 years of age. You are older than 85 years of age and your health care provider tells you that you are at risk for this type of infection. Your sexual activity has changed since you were last screened, and you are at increased risk for chlamydia or gonorrhea. Ask your health care provider if you are at risk. Ask your health care provider about whether you are at high risk for HIV. Your health care provider may recommend a prescription medicine   to help prevent HIV infection. If you choose to take medicine to prevent HIV, you should first get tested for HIV. You should then be tested every 3 months for as long as you are taking the medicine. Pregnancy If you are about to stop having your period (premenopausal) and you may become pregnant, seek counseling before you get  pregnant. Take 400 to 800 micrograms (mcg) of folic acid every day if you become pregnant. Ask for birth control (contraception) if you want to prevent pregnancy. Osteoporosis and menopause Osteoporosis is a disease in which the bones lose minerals and strength with aging. This can result in bone fractures. If you are 65 years old or older, or if you are at risk for osteoporosis and fractures, ask your health care provider if you should: Be screened for bone loss. Take a calcium or vitamin D supplement to lower your risk of fractures. Be given hormone replacement therapy (HRT) to treat symptoms of menopause. Follow these instructions at home: Lifestyle Do not use any products that contain nicotine or tobacco, such as cigarettes, e-cigarettes, and chewing tobacco. If you need help quitting, ask your health care provider. Do not use street drugs. Do not share needles. Ask your health care provider for help if you need support or information about quitting drugs. Alcohol use Do not drink alcohol if: Your health care provider tells you not to drink. You are pregnant, may be pregnant, or are planning to become pregnant. If you drink alcohol: Limit how much you use to 0-1 drink a day. Limit intake if you are breastfeeding. Be aware of how much alcohol is in your drink. In the [place] one drink equals one 12 oz bottle of beer (355 mL), one 5 oz glass of wine (148 mL), or one 1 oz glass of hard liquor (44 mL). General instructions Schedule regular health, dental, and eye exams. Stay current with your vaccines. Tell your health care provider if: You often feel depressed. You have ever been abused or do not feel safe at home. Summary Adopting a healthy lifestyle and getting preventive care are important in promoting health and wellness. Follow your health care provider's instructions about healthy diet, exercising, and getting tested or screened for diseases. Follow your health care provider's  instructions on monitoring your cholesterol and blood pressure. This information is not intended to replace advice given to you by your health care provider. Make sure you discuss any questions you have with your health care provider. Document Revised: 10/26/2020 Document Reviewed: 08/11/2018 Elsevier Patient Education  2022 Elsevier Inc.  

## 2021-05-15 NOTE — Progress Notes (Signed)
Subjective:  Patient ID: [name], female    DOB: 08-May-1933  Age: 87 y.o. MRN: [id number]  CC: Annual Exam, Hypertension, Hyperlipidemia, and Diabetes  This visit occurred during the SARS-CoV-2 [facility] health emergency.  Safety protocols were in place, including screening questions prior to the visit, additional usage of staff PPE, and extensive cleaning of exam room while observing appropriate contact time as indicated for disinfecting solutions.    HPI ARTEMISIA RITZ presents for a CPX and f/up -   She has felt well recently and offers no complaints.  She denies palpitations, chest pain, shortness of breath, diaphoresis, edema, dizziness, or lightheadedness.  Outpatient Medications Prior to Visit  Medication Sig Dispense Refill   acetaminophen (TYLENOL) 500 MG tablet Take 1,000 mg by mouth every 6 (six) hours as needed for headache (pain).     Calcium Carbonate-Vitamin D (CALCIUM-D PO) Take 1 tablet by mouth at bedtime. Calcium 630 mg, Vitamin D3 500 units     cyanocobalamin 2000 MCG tablet Take 1 tablet (2,000 mcg total) by mouth every other day. 45 tablet 1   diclofenac sodium (VOLTAREN) 1 % GEL Apply 2 g topically 4 (four) times daily. 100 g 1   Dietary Management Product (FOSTEUM PLUS) CAPS TAKE ONE CAPSULE BY MOUTH TWICE DAILY 180 capsule 1   famotidine (PEPCID) 40 MG tablet TAKE 1 TABLET (40 MG TOTAL) BY MOUTH AT BEDTIME. 90 tablet 1   finasteride (PROPECIA) 1 MG tablet TAKE 1 TABLET AT BEDTIME 90 tablet 1   metoprolol tartrate (LOPRESSOR) 25 MG tablet TAKE 1 AND 1/2 TABLETS IN THE MORNING  AND TAKE 1 TABLET AT BEDTIME 225 tablet 0   potassium chloride (KLOR-CON) 10 MEQ tablet TAKE 2 TABLETS DAILY (DOSE INCREASED DUE TO HYPOKALEMIA) 180 tablet 1   sotalol (BETAPACE) 120 MG tablet TAKE 1 TABLET EVERY 12 HOURS 180 tablet 3   traMADol (ULTRAM) 50 MG tablet TAKE 1 TABLET BY MOUTH EVERY 6 HOURS AS NEEDED 90 tablet 5   chlorthalidone (HYGROTON) 25 MG tablet TAKE 1 TABLET EVERY  DAY 90 tablet 0   Pitavastatin Calcium (LIVALO) 2 MG TABS Take 1 tablet (2 mg total) by mouth daily. 90 tablet 1   XARELTO 20 MG TABS tablet TAKE 1 TABLET EVERY DAY WITH SUPPER 90 tablet 0   No facility-administered medications prior to visit.    ROS Review of Systems  Constitutional:  Negative for diaphoresis, fatigue and unexpected weight change.  HENT: Negative.  Negative for trouble swallowing.   Eyes: Negative.   Respiratory:  Negative for cough, chest tightness, shortness of breath and wheezing.   Cardiovascular:  Negative for chest pain, palpitations and leg swelling.  Gastrointestinal:  Negative for abdominal pain, constipation, diarrhea, nausea and vomiting.  Endocrine: Negative.   Genitourinary: Negative.  Negative for difficulty urinating and dysuria.  Musculoskeletal: Negative.  Negative for arthralgias, joint swelling and myalgias.  Skin: Negative.   Neurological: Negative.  Negative for dizziness, weakness, light-headedness and headaches.  Hematological:  Negative for adenopathy. Does not bruise/bleed easily.  Psychiatric/Behavioral: Negative.     Objective:  BP (!) 144/72 (BP Location: Left Arm, Patient Position: Sitting, Cuff Size: Large)   Pulse 62   Temp 98.2 F (36.8 C) (Oral)   Ht '5\' 6"'$  (1.676 m)   Wt 188 lb (85.3 kg)   SpO2 96%   BMI 30.34 kg/m   BP Readings from Last 3 Encounters:  05/15/21 (!) 144/72  07/23/20 134/84  05/25/20 140/76    Wt  Readings from Last 3 Encounters:  05/15/21 188 lb (85.3 kg)  07/23/20 195 lb (88.5 kg)  05/25/20 198 lb (89.8 kg)    Physical Exam Vitals reviewed.  Constitutional:      Appearance: Normal appearance.  HENT:     Nose: Nose normal.     Mouth/Throat:     Mouth: Mucous membranes are moist.  Eyes:     General: No scleral icterus.    Conjunctiva/sclera: Conjunctivae normal.  Cardiovascular:     Rate and Rhythm: Normal rate and regular rhythm.     Pulses: Normal pulses.     Heart sounds: No murmur  heard. Pulmonary:     Effort: Pulmonary effort is normal.     Breath sounds: No stridor. No wheezing, rhonchi or rales.  Abdominal:     General: Abdomen is flat. Bowel sounds are normal. There is no distension.     Palpations: Abdomen is soft. There is no fluid wave, hepatomegaly, splenomegaly or mass.     Tenderness: There is no abdominal tenderness.  Musculoskeletal:        General: Normal range of motion.     Cervical back: Neck supple.     Right lower leg: No edema.     Left lower leg: No edema.  Lymphadenopathy:     Cervical: No cervical adenopathy.  Skin:    General: Skin is warm and dry.  Neurological:     General: No focal deficit present.     Mental Status: She is alert.  Psychiatric:        Mood and Affect: Mood normal.        Behavior: Behavior normal.    Lab Results  Component Value Date   WBC 5.6 05/15/2021   HGB 14.3 05/15/2021   HCT 42.8 05/15/2021   PLT 177.0 05/15/2021   GLUCOSE 94 05/15/2021   CHOL 180 05/15/2021   TRIG 159.0 (H) 05/15/2021   HDL 53.80 05/15/2021   LDLDIRECT 150.0 12/28/2017   LDLCALC 95 05/15/2021   ALT 14 05/15/2021   AST 20 05/15/2021   NA 140 05/15/2021   K 3.5 05/15/2021   CL 102 05/15/2021   CREATININE 0.85 05/15/2021   BUN 19 05/15/2021   CO2 30 05/15/2021   TSH 1.39 05/15/2021   INR 1.02 07/07/2017   HGBA1C 5.5 12/18/2016    CT SHOULDER RIGHT WO CONTRAST  Result Date: 06/06/2019 CLINICAL DATA:  Chronic right shoulder pain. EXAM: CT OF THE UPPER RIGHT EXTREMITY WITHOUT CONTRAST TECHNIQUE: Multidetector CT imaging of the upper right extremity was performed according to the standard protocol. COMPARISON:  Right shoulder x-rays dated April 20, 2019. MRI right shoulder dated September 18, 2014. FINDINGS: Bones/Joint/Cartilage No fracture or dislocation. Normal alignment. Small joint effusion. Mild osteoarthritis of the glenohumeral joint with mild joint space narrowing and marginal osteophytosis. Chondrocalcinosis. Moderate  arthropathy of the acromioclavicular joint. Type II acromion. Ligaments Ligaments are suboptimally evaluated by CT. Muscles and Tendons Moderate infraspinatus and mild supraspinatus muscle atrophy. Peritendinous calcification along the subscapularis myotendinous junction. Soft tissue No fluid collection or hematoma.  No soft tissue mass. IMPRESSION: 1. Mild glenohumeral osteoarthritis. Chondrocalcinosis could reflect underlying CPPD arthropathy. 2. Moderate infraspinatus and mild supraspinatus muscle atrophy related to underlying full-thickness tendon tears seen on prior MRI from January 2016. 3. Moderate acromioclavicular osteoarthritis. Electronically Signed   By: Titus Dubin M.D.   On: 06/06/2019 19:43    Assessment & Plan:   Leone was seen today for annual exam, hypertension, hyperlipidemia and diabetes.  Diagnoses and all orders for this visit:  Paroxysmal atrial fibrillation ([place])- She has good rate and rhythm control.  Will continue the DOAC to reduce the risk of stroke. -     CBC with Differential/Platelet; Future -     Basic metabolic panel; Future -     TSH; Future -     rivaroxaban (XARELTO) 20 MG TABS tablet; Take 1 tablet (20 mg total) by mouth daily with supper. -     TSH -     Basic metabolic panel -     CBC with Differential/Platelet  Essential hypertension -     chlorthalidone (HYGROTON) 25 MG tablet; Take 1 tablet (25 mg total) by mouth daily. -     Hepatic function panel; Future -     Hepatic function panel  Primary hypertension- Her blood pressure is adequately well controlled.  Electrolytes and renal function are normal. -     CBC with Differential/Platelet; Future -     Basic metabolic panel; Future -     TSH; Future -     Hepatic function panel; Future -     Hepatic function panel -     TSH -     Basic metabolic panel -     CBC with Differential/Platelet  Dyslipidemia, goal LDL below 100- LDL goal achieved. Doing well on the statin  -     Pitavastatin  Calcium (LIVALO) 2 MG TABS; Take 1 tablet (2 mg total) by mouth daily. -     Lipid panel; Future -     Hepatic function panel; Future -     Hepatic function panel -     Lipid panel  Encounter for general adult medical examination with abnormal findings- Exam completed, labs reviewed, she refused all vaccines today, no cancer screenings indicated, patient education was given.  I have changed Orlean Patten. [place]'s Xarelto to rivaroxaban. I have also changed her chlorthalidone. I am also having her maintain her Calcium Carbonate-Vitamin D (CALCIUM-D PO), acetaminophen, diclofenac sodium, famotidine, cyanocobalamin, sotalol, Fosteum Plus, finasteride, traMADol, potassium chloride, metoprolol tartrate, and Livalo.  Meds ordered this encounter  Medications   chlorthalidone (HYGROTON) 25 MG tablet    Sig: Take 1 tablet (25 mg total) by mouth daily.    Dispense:  90 tablet    Refill:  0   Pitavastatin Calcium (LIVALO) 2 MG TABS    Sig: Take 1 tablet (2 mg total) by mouth daily.    Dispense:  90 tablet    Refill:  1   rivaroxaban (XARELTO) 20 MG TABS tablet    Sig: Take 1 tablet (20 mg total) by mouth daily with supper.    Dispense:  90 tablet    Refill:  1     Follow-up: Return in about 6 months (around 11/12/2021).  [name], MD

## 2021-06-27 ENCOUNTER — Telehealth: Payer: Self-pay | Admitting: Cardiovascular Disease

## 2021-06-27 NOTE — Telephone Encounter (Signed)
  Patient calling the office for samples of medication:   1.  What medication and dosage are you requesting samples for?  Pitavastatin Calcium (LIVALO) 2 MG TABS   rivaroxaban (XARELTO) 20 MG TABS tablet 2.  Are you currently out of this medication? Yes  Pt is in donut hole and will cost her over $400 for these prescription

## 2021-06-28 ENCOUNTER — Telehealth: Payer: Self-pay

## 2021-06-28 NOTE — Telephone Encounter (Signed)
Patient walked in office stated she called yesterday requesting samples.Stated she never heard back.Livalo 2 mg and Xarelto 20 mg samples given.She was also given [place] patient support # to call for assistance with medications.

## 2021-07-10 ENCOUNTER — Encounter: Payer: Self-pay | Admitting: Cardiovascular Disease

## 2021-07-10 ENCOUNTER — Other Ambulatory Visit: Payer: Self-pay

## 2021-07-10 ENCOUNTER — Ambulatory Visit: Payer: Medicare HMO | Admitting: Cardiovascular Disease

## 2021-07-10 DIAGNOSIS — K5090 Crohn's disease, unspecified, without complications: Secondary | ICD-10-CM | POA: Diagnosis not present

## 2021-07-10 DIAGNOSIS — I480 Paroxysmal atrial fibrillation: Secondary | ICD-10-CM | POA: Diagnosis not present

## 2021-07-10 DIAGNOSIS — I739 Peripheral vascular disease, unspecified: Secondary | ICD-10-CM | POA: Diagnosis not present

## 2021-07-10 DIAGNOSIS — E785 Hyperlipidemia, unspecified: Secondary | ICD-10-CM | POA: Diagnosis not present

## 2021-07-10 DIAGNOSIS — Z7901 Long term (current) use of anticoagulants: Secondary | ICD-10-CM | POA: Diagnosis not present

## 2021-07-10 DIAGNOSIS — I10 Essential (primary) hypertension: Secondary | ICD-10-CM | POA: Diagnosis not present

## 2021-07-10 MED ORDER — SOTALOL HCL 120 MG PO TABS: 120.0000 mg | ORAL_TABLET | Freq: Two times a day (BID) | ORAL | 3 refills | Status: DC

## 2021-07-10 NOTE — Progress Notes (Signed)
Patient ID: [name], female   DOB: 1933/02/01, 87 y.o.   MRN: [id number]    PCP: Dr. Eilleen Kempf  HPI: [name] is a 87 y.o. female who presents for a 79 month cardiology evaluation.  [name] has a history of paroxysmal atrial fibrillation, hypertension, as well as hyperlipidemia. Remotely, she developed myalgias with simvastatin and had not been willing to try additional lipid lowering therapy. In August 2012 an echo Doppler study showed mild asymmetric LVH with proximal septal thickening without LVOT obstruction. She had grade 1 diastolic dysfunction with normal systolic function, mild MR, mild TR, and aortic valve sclerosis with mild MR.  In December 2014 follow-up blood work showed normal renal function with a BUN of 19, creatinine 0.8.  She continued to have significant hyperlipidemia with a total cholesterol of 250, triglycerides 221 HDL 53, VLDL 44 and LDL cholesterol 153.  TSH was normal at 2.275.   [name]  had noticed development of claudication, particularly involving her left leg with activity.  She had episodes of shortness of breath and fatigue.  She  underwent a lower extremity arterial Doppler study which was abnormal and showed an ABI of 0.63 on the left and 1.0 on the right.  The left common iliac, external iliac, common femoral artery and profunda demonstrated multiphasic flow.  However, the superficial femoral artery on the left demonstrated occlusive disease in the proximal to mid thigh with reconstitution of flow in the mid distal segment.  The popliteal artery demonstrated patent monophasic flow with three-vessel runoff.  The anterior tibial artery demonstrated focal stenosis in the proximal calf with dampened flow distal to this.  She presented to [facility] in August 2016 with complaints of palpitations, shortness of breath and presyncope.  She was noted to be in atrial fibrillation with rapid ventricular response.  Her heart rate was initially attempted to  be controlled with metoprolol and digitoxin, which was not successful and ultimately sotalol was started.  On 04/20/2015 she underwent successful TEE guided cardioversion.  She was continued on metoprolol as well as eliquis for anticoagulation.  An echo Doppler study on 04/18/2015 showed an EF of 55-60% with mild LVH.  She has multiple allergies and in the past did not tolerate ACE-inhibition or ARB therapy and was able to tolerate direct renin inhibition.  She has a history of GERD and has been taking ranitidine and sucralfate.  She has a history of hyperlipidemia and has had issues with statins.  When I saw her in October 2017 she was maintaining sinus rhythm.  She  presented to the emergency room on 01/15/2017 with chest tightness, shortness of breath and palpitations.  She was found to be back in atrial fibrillation.  She has continued to take anticoagulation with eloquence 5 mg twice a day.  During that evaluation, she underwent successful cardioversion and sinus rhythm was restored with 200 J cardioversion.   When I  saw her in May 2018, she was maintaining sinus rhythm.  I discussed the possibility of sleep apnea in the etiology of her recurrent AF but she did not want to pursue a sleep evaluation.  She underwent a follow-up echo Doppler study on 02/06/2017 which showed an EF of 60-65%.  There was moderate LVH, grade 2 diastolic dysfunction, and she was without major valvular abnormalities.  She developed recurrent atrial fibrillation with mild chest pain.  She failed 2 attempts to cardioversion in the emergency room at 200 J.  She ultimately was seen by  Dr. Rayann Heman in consultation and although her QTc was mildly prolonged, he felt that it was stable.  As result sotalol was increased to 120 mg twice a day.  2 days later on 07/10/2017 she underwent successful cardioversion on the increased dose.  She has a maintaining normal rhythm since.  She hadn't atrial fibrillation clinic follow-up on 07/17/2017 and  her ECG showed sinus rhythm at 60 bpm.  She has continued to be on sotalol 120 twice a day, metoprolol 25 mg twice a day, and eliquis 5 mg twice a day for cha2ds2score of at least 5.  Of note, upon further questioning, she has had issues recently where food gets stuck in her esophagus.  Remotely she had undergone esophageal dilatations in 2015.   When I saw her in June 2019  she denied any awareness of recurrent atrial fibrillation.  She has peripheral neuropathy of her feet.  She had undergone  laboratory by her primary physician which showed a cholesterol 229, triglycerides 340, VLDL 68, and direct LDL 150.  He had issues with numerous statins and during that evaluation I recommended a trial of Livalo and provided her with samples of 1 mg daily for 1 month with plans to titrate to 2 mg..  She subsequently was seen by [name] as well as [name] pharmacist and there was some discussion concerning PCSK9 inhibition.  However due to cost issues she preferred to continue to try the Livalo.  She has tolerated this well with improvement in her LVH but unfortunately the cost has been significant.  I saw her in June 2020.  At that time she was taking Livalo 2 mg daily and lipid studies were significantly improved with total cholesterol 168, LDL 72, HDL 54.    I saw her in January 2021 and since her prior evaluation she had remained stable from a cardiac standpoint.  She was unaware of any recurrent episodes of atrial fibrillation. She presented to the emergency room in August 2020 with right shoulder pain.  She had previously experienced chronic issues with the pain had exacerbated.  She was found to have several tears.  There was some discussion concerning scheduling her for right shoulder surgery with Dr. Griffin Basil.  However over the past several months, her shoulder discomfort has improved and surgery is therefore not scheduled.  When she presented to the emergency room, her blood pressure was significantly  elevated at 198/83.  She had seen her primary physician and November 2020 and at that time her blood pressure was elevated at 158/86.  At times she does note some mild ankle swelling.   I last saw her on May 25, 2020 and since her prior evaluation she continued to do well.  She denied any chest pain and was unaware of any recurrent atrial fibrillation.  She continued to be on Xarelto but now that she is in the donut hole the cost is very expensive.  She has been on finasteride to potentially help with some mild hair loss.  She is on metoprolol tartrate 37.5 mg in the morning and 25 mg at night in addition to sotalol 120 mg twice a day and chlorthalidone 25 mg daily.  She has neuropathy on gabapentin.    Since I last saw her, she is unaware of any recurrent atrial fibrillation.  She admits to increased stress at the household.  Her husband has stage IV lung CA with increasing dementia.  She has noticed some mild swelling in her right greater than left ankle.  Her blood pressure has remained stable.  She had undergone laboratory in September 2022 hemoglobin hematocrit were stable.  LFTs were normal.  LDL cholesterol was 95 with triglycerides 159 and total cholesterol 180.  TSH was normal at 1.39.  Renal function was stable with creatinine of 0.85.  She presents for evaluation.   Past Medical History:  Diagnosis Date   Abnormal nuclear stress test    mild anterolateral septal and inferior ischemia   Arthritis    Atherosclerosis of lower extremity with claudication (HCC) 04/17/2015   LEFT LEG   Atrial fibrillation (HCC)    Claudication (HCC)    Colon cancer (HCC)    Coronary atherosclerosis of unspecified type of vessel, native or graft    Esophageal stricture    Fatty liver 11/05/11   Hiatal hernia    HLD (hyperlipidemia)    Hypertension    Iron deficiency anemia, unspecified    Ischemic colitis ([place])    Neuropathy    Osteoporosis    PAF (paroxysmal atrial fibrillation) ([place]) 04/2015    Unspecified vascular insufficiency of intestine     Past Surgical History:  Procedure Laterality Date   abdominoperineal resection anastomotic stricture  05/2007   APPENDECTOMY     CARDIAC CATHETERIZATION N/A 03/13/2015   Procedure: Left Heart Cath and Coronary Angiography;  Surgeon: Troy Sine, MD;  Location: [place] [facility];  Service: Cardiovascular;  Laterality: N/A;   CARDIOVERSION N/A 04/20/2015   Procedure: CARDIOVERSION;  Surgeon: Jerline Pain, MD;  Location: [facility];  Service: Cardiovascular;  Laterality: N/A;   CARDIOVERSION N/A 07/10/2017   Procedure: CARDIOVERSION;  Surgeon: Jerline Pain, MD;  Location: [facility];  Service: Cardiovascular;  Laterality: N/A;   [place]   ESOPHAGOGASTRODUODENOSCOPY N/A 04/27/2014   Procedure: ESOPHAGOGASTRODUODENOSCOPY (EGD);  Surgeon: Lafayette Dragon, MD;  Location: [facility];  Service: Endoscopy;  Laterality: N/A;   Mandibular Renstruction     ROTATOR CUFF REPAIR  2006   left   Rt. Salpingo oophorectomy and cyst removal  2005   SAVORY DILATION N/A 04/27/2014   Procedure: SAVORY DILATION;  Surgeon: Lafayette Dragon, MD;  Location: [facility];  Service: Endoscopy;  Laterality: N/A;  no xray needed   sigmoid resection for invasive rectal adenocarcinoma  12/2006   TEE WITHOUT CARDIOVERSION N/A 04/20/2015   Procedure: TRANSESOPHAGEAL ECHOCARDIOGRAM (TEE);  Surgeon: Jerline Pain, MD;  Location: [facility];  Service: Cardiovascular;  Laterality: N/A;   TUBAL LIGATION     WRIST SURGERY  2008   right    Allergies  Allergen Reactions   Crestor [Rosuvastatin Calcium] Other (See Comments)    Muscle aches   Acetaminophen-Codeine Other (See Comments)    Unknown reaction - possibly nausea   Amlodipine Besylate Swelling    Hands and feet swelling   Hydrocodone-Acetaminophen Nausea And Vomiting   Irbesartan-Hydrochlorothiazide Other (See Comments)    REACTION: Dizziness   Lisinopril Cough   Omeprazole  Nausea Only and Other (See Comments)    Dizziness, joint pain, weakness in legs, cramps in legs, stomach burned   Propoxyphene N-Acetaminophen Other (See Comments)    Headache, and blotchy face   Valsartan Other (See Comments)    REACTION: blurred vision   Warfarin Sodium Other (See Comments)    REACTION: body aches and bleeding   Diltiazem Hcl Rash   Verapamil Nausea Only, Palpitations and Other (See Comments)    Headaches     Current Outpatient Medications  Medication Sig  Dispense Refill   acetaminophen (TYLENOL) 500 MG tablet Take 1,000 mg by mouth every 6 (six) hours as needed for headache (pain).     Calcium Carbonate-Vitamin D (CALCIUM-D PO) Take 1 tablet by mouth at bedtime. Calcium 630 mg, Vitamin D3 500 units     cyanocobalamin 2000 MCG tablet Take 1 tablet (2,000 mcg total) by mouth every other day. 45 tablet 1   diclofenac sodium (VOLTAREN) 1 % GEL Apply 2 g topically 4 (four) times daily. 100 g 1   Dietary Management Product (FOSTEUM PLUS) CAPS TAKE ONE CAPSULE BY MOUTH TWICE DAILY 180 capsule 1   famotidine (PEPCID) 40 MG tablet TAKE 1 TABLET (40 MG TOTAL) BY MOUTH AT BEDTIME. 90 tablet 1   finasteride (PROPECIA) 1 MG tablet TAKE 1 TABLET AT BEDTIME 90 tablet 1   metoprolol tartrate (LOPRESSOR) 25 MG tablet TAKE 1 AND 1/2 TABLETS IN THE MORNING  AND TAKE 1 TABLET AT BEDTIME 225 tablet 0   Pitavastatin Calcium (LIVALO) 2 MG TABS Take 1 tablet (2 mg total) by mouth daily. 90 tablet 1   potassium chloride (KLOR-CON) 10 MEQ tablet TAKE 2 TABLETS DAILY (DOSE INCREASED DUE TO HYPOKALEMIA) 180 tablet 1   rivaroxaban (XARELTO) 20 MG TABS tablet Take 1 tablet (20 mg total) by mouth daily with supper. 90 tablet 1   traMADol (ULTRAM) 50 MG tablet TAKE 1 TABLET BY MOUTH EVERY 6 HOURS AS NEEDED 90 tablet 5   chlorthalidone (HYGROTON) 25 MG tablet TAKE 1 TABLET EVERY DAY 90 tablet 0   sotalol (BETAPACE) 120 MG tablet TAKE 1 TABLET EVERY 12 HOURS 180 tablet 3   No current  facility-administered medications for this visit.    Social History   Socioeconomic History   Marital status: Widowed    Spouse name: Not on file   Number of children: 4   Years of education: 12th grade   Highest education level: Not on file  Occupational History   Occupation: retired    Fish farm manager: RETIRED  Tobacco Use   Smoking status: Never   Smokeless tobacco: Never  Vaping Use   Vaping Use: Never used  Substance and Sexual Activity   Alcohol use: No   Drug use: No   Sexual activity: Not Currently  Other Topics Concern   Not on file  Social History Narrative   Lives at home with fiance.   Right-handed.   No caffeine use.   Social Determinants of Health   Financial Resource Strain: Not on file  Food Insecurity: Not on file  Transportation Needs: Not on file  Physical Activity: Not on file  Stress: Not on file  Social Connections: Not on file  Intimate Partner Violence: Not on file    Family History  Problem Relation Age of Onset   Colon cancer Father 98   Heart disease Father    Hypertension Father    Alzheimer's disease Mother    Thyroid disease Mother    Hypertension Sister    Thyroid disease Sister    Mitral valve prolapse Sister    Hypertension Son    Diabetes Son    Hypertension Son    Diabetes Son    Esophageal cancer Neg Hx    Rectal cancer Neg Hx    Stomach cancer Neg Hx    Social history  is notable that she is widowed. She has 3 children and 9 grandchildren. She remains active. There is no alcohol tobacco use.   ROS General: Negative; No fevers, chills, or  night sweats; positive for fatigue and shortness of breath HEENT: Negative; No changes in vision or hearing, sinus congestion, difficulty swallowing Pulmonary: Negative; No cough, wheezing, hemoptysis Cardiovascular: Positive for PAF, status post cardioversion Positive for left leg claudication GI: Positive for GERD; dysphagia GU: Negative; No dysuria, hematuria, or difficulty  voiding Musculoskeletal: Right shoulder muscle tears Hematologic/Oncology: Negative; no easy bruising, bleeding Endocrine: Negative; no heat/cold intolerance; no diabetes Neuro: Positive for peripheral neuropathy Skin: Negative; No rashes or skin lesions Psychiatric: Negative; No behavioral problems, depression Sleep: Negative; No snoring, daytime sleepiness, hypersomnolence, bruxism, restless legs, hypnogognic hallucinations, no cataplexy Other comprehensive 14 point system review is negative.   PE BP 138/76   Pulse 60   Ht _0  (1.676 m)   Wt 189 lb (85.7 kg)   SpO2 97%   BMI 30.51 kg/m    Repeat blood pressure by me was 138/76.  She states her blood pressure at home typically is around 130/72.  Wt Readings from Last 3 Encounters:  07/10/21 189 lb (85.7 kg)  05/15/21 188 lb (85.3 kg)  07/23/20 195 lb (88.5 kg)   General: Alert, oriented, no distress.  Skin: normal turgor, no rashes, warm and dry HEENT: Normocephalic, atraumatic. Pupils equal round and reactive to light; sclera anicteric; extraocular muscles intact;  Nose without nasal septal hypertrophy Mouth/Parynx benign; Mallinpatti scale 3 Neck: No JVD, no carotid bruits; normal carotid upstroke Lungs: clear to ausculatation and percussion; no wheezing or rales Chest wall: without tenderness to palpitation Heart: PMI not displaced, RRR, s1 s2 normal, 1/6 systolic murmur, no diastolic murmur, no rubs, gallops, thrills, or heaves Abdomen: soft, nontender; no hepatosplenomehaly, BS+; abdominal aorta nontender and not dilated by palpation. Back: no CVA tenderness Pulses 2+ Musculoskeletal: full range of motion, normal strength, no joint deformities Extremities: Trace right ankle edema, no left ankle edema no clubbing cyanosis or edema, Homan's sign negative  Neurologic: grossly nonfocal; Cranial nerves grossly wnl Psychologic: Normal mood and affect     July 10, 2021 ECG (independently read by me):  Low atrial  rhythm at 60, QTc 468 msec, no ectopy  May 25, 2020 ECG (independently read by me): Probable low atrial rhythm with negative P waves in leads II and III. Heart rate 55 bpm. No ectopy.  QTc interval 470 ms.  January 2021 ECG (independently read by me): Normal sinus rhythm at 61 bpm.  PR interval 170 ms, QTc interval 479 ms  June 2020 ECG (independently read by me): Sinus bradycardia at 52 bpm.  QTc interval 453 ms on sotalol;  no significant ST-T changes  June 2019 ECG (independently read by me): Sinus bradycardia 56 bpm.  QTc interval 474 ms.  December 2018 ECG (independently read by me): Normal sinus rhythm at 60 bpm.  QTc interval 454 ms.  No ST segment changes.  October 2018 ECG (independently read by me): Normal sinus rhythm at 62 bpm.  No significant ST-T changes.  No ectopy.  QTc interval 452 ms.  May 2018 ECG (independently read by me): Sinus bradycardia 59 bpm.  Low voltage.  Normal intervals.  No ST segment changes.  October 2017 ECG (independently read by me): Sinus bradycardia 57 bpm.  No ectopy.  Normal intervals.  November 2016 ECG (independently read by me): Sinus bradycardia 59 bpm.  No ectopy.  Normal intervals.  QTC 457 ms.  05/22/2015 ECG (independently read by me): Normal sinus rhythm at 60 bpm.  QTc interval 454 ms.  No significant ST segment changes.  June 2016  ECG (independently read by me): Normal sinus rhythm at 63 bpm.  Poor precordial R-wave progression.  No ectopy.  November 2015 ECG (independently read by me);  Normal sinus rhythm at 59 bpm.  QTc interval 415 ms.  No significant ST segment changes.  Prior November 2014ECG: Sinus rhythm at 63 beats per minute. Normal intervals.  LABS: BMP Latest Ref Rng & Units 05/15/2021 07/23/2020 01/26/2020  Glucose 70 - 99 mg/dL 94 104(H) 99  BUN 6 - 23 mg/dL _0 Creatinine 0.40 - 1.20 mg/dL 0.85 0.90 0.86  BUN/Creat Ratio 12 - 28 - - -  Sodium 135 - 145 mEq/L 140 141 141  Potassium 3.5 - 5.1 mEq/L 3.5 4.2  3.6  Chloride 96 - 112 mEq/L 102 101 104  CO2 19 - 32 mEq/L 30 32 30  Calcium 8.4 - 10.5 mg/dL 10.4 10.4 9.7   Hepatic Function Latest Ref Rng & Units 05/15/2021 09/14/2019 02/28/2019  Total Protein 6.0 - 8.3 g/dL 7.2 6.8 6.7  Albumin 3.5 - 5.2 g/dL 4.1 4.4 4.4  AST 0 - 37 U/L _1 ALT 0 - 35 U/L _2 Alk Phosphatase 39 - 117 U/L 45 56 46  Total Bilirubin 0.2 - 1.2 mg/dL 1.2 0.9 1.0  Bilirubin, Direct 0.0 - 0.3 mg/dL 0.2 - -   CBC Latest Ref Rng & Units 05/15/2021 01/26/2020 09/14/2019  WBC 4.0 - 10.5 K/uL 5.6 4.6 4.7  Hemoglobin 12.0 - 15.0 g/dL 14.3 12.5 13.4  Hematocrit 36.0 - 46.0 % 42.8 38.0 41.9  Platelets 150.0 - 400.0 K/uL 177.0 188.0 193   Lab Results  Component Value Date   MCV 89.9 05/15/2021   MCV 85.1 01/26/2020   MCV 88 09/14/2019   Lab Results  Component Value Date   TSH 1.39 05/15/2021   Lab Results  Component Value Date   HGBA1C 5.5 12/18/2016   Lipid Panel     Component Value Date/Time   CHOL 180 05/15/2021 1414   CHOL 184 09/14/2019 0944   TRIG 159.0 (H) 05/15/2021 1414   HDL 53.80 05/15/2021 1414   HDL 54 09/14/2019 0944   CHOLHDL 3 05/15/2021 1414   VLDL 31.8 05/15/2021 1414   LDLCALC 95 05/15/2021 1414   LDLCALC 99 09/14/2019 0944   LDLDIRECT 150.0 12/28/2017 1340    RADIOLOGY: US Breast Right  07/04/2013   CLINICAL DATA:  Abnormal screening right mammogram.  EXAM: DIGITAL DIAGNOSTIC  right MAMMOGRAM  ULTRASOUND right BREAST  COMPARISON:  With prior exams.  ACR Breast Density Category b: There are scattered areas of fibroglandular density.  FINDINGS: Spot compression views of the lateral aspect of the right breast were performed. There is persistence of a 4 mm low-density nodule in the posterior 3rd of the breast. There are no malignant type microcalcifications.  On physical exam I do not palpate a mass in the right breast.  Ultrasound is performed, showing there is a near anechoic lesion in the right breast at 7 o'clock 8 cm from the  nipple measuring 3 x 2 x 3 mm.  IMPRESSION: Probable benign lesion in the right breast.  RECOMMENDATION: Short-term interval followup right mammogram and ultrasound in 6 months is recommended to document stability.  I have discussed the findings and recommendations with the patient. Results were also provided in writing at the conclusion of the visit. If applicable, a reminder letter will be sent to the patient regarding the next appointment.  BI-RADS CATEGORY  3: Probably benign finding(s) -  short interval follow-up suggested.   Electronically Signed   By: Lillia Mountain M.D.   On: 07/04/2013 09:01   Mm [place] R  07/04/2013   CLINICAL DATA:  Abnormal screening right mammogram.  EXAM: DIGITAL DIAGNOSTIC  right MAMMOGRAM  ULTRASOUND right BREAST  COMPARISON:  With prior exams.  ACR Breast Density Category b: There are scattered areas of fibroglandular density.  FINDINGS: Spot compression views of the lateral aspect of the right breast were performed. There is persistence of a 4 mm low-density nodule in the posterior 3rd of the breast. There are no malignant type microcalcifications.  On physical exam I do not palpate a mass in the right breast.  Ultrasound is performed, showing there is a near anechoic lesion in the right breast at 7 o'clock 8 cm from the nipple measuring 3 x 2 x 3 mm.  IMPRESSION: Probable benign lesion in the right breast.  RECOMMENDATION: Short-term interval followup right mammogram and ultrasound in 6 months is recommended to document stability.  I have discussed the findings and recommendations with the patient. Results were also provided in writing at the conclusion of the visit. If applicable, a reminder letter will be sent to the patient regarding the next appointment.  BI-RADS CATEGORY  3: Probably benign finding(s) - short interval follow-up suggested.   Electronically Signed   By: Lillia Mountain M.D.   On: 07/04/2013 09:01   IMPRESSION:  1. Paroxysmal atrial fibrillation (HCC)    2. Anticoagulation adequate   3. Essential hypertension   4. Dyslipidemia, goal LDL below 70   5. PVD (peripheral vascular disease) ([place])      ASSESSMENT AND PLAN: [name] is a young appearing 87 year-old female who has a history of hypertension, paroxysmal atrial fibrillation, hyperlipidemia and peripheral vascular disease. She  is on Xarelto for anticoagulation and tolerating this well.  She developed recurrent AF and underwent successful cardioversion during her emergency room evaluation on 01/15/2017.  She developed recurrent A. fib despite taking metoprolol and sotalol 80 mg twice a day.  She had 2 failed attempts in the emergency room in early November when she presented with AF with RVR.  Ultimately, her sotalol dose was increased to 120 twice a day and she was able to be successfully cardioverted.  Subsequently, she has done well has been without recurrent AF on her increased sotalol dose in addition to metoprolol.  QTc interval has been stable.  Her last echo has demonstrated normal systolic function, moderate LVH with grade 2 diastolic dysfunction.  She has had blood pressure lability noted on several prior evaluations.  Presently, she states her blood pressure at home has been running high.  72.  Her blood pressure in the office was 138/76.  She admits that she has been under increased stress in the home front with her husband now with stage IV lung cancer and also progressive dementia.  Yesterday she had eaten some Fritos.  Today there is evidence for trace right ankle edema but no edema on the left.  She states typically she does not have leg swelling.  She continues to be on chlorthalidone 25 mg daily in addition to metoprolol tartrate 37.5 mg in the morning and 25 mg at night.  She is on sotalol 120 mg twice a day.  ECG today shows a probable low atrial rhythm at 60 bpm.  QTc interval remained stable at 468 ms.  She continues to be on Xarelto 20 mg for anticoagulation.  We are trying  to  provide her with samples particularly when she gets into the donut hole.  She is on Livalo 2 mg daily for hyperlipidemia.  Renal function remained stable.  She has previously documented PVD with reduced ABI in the left with occlusive disease in the proximal to mid left superficial femoral artery with mono phasic flow and three-vessel runoff in the popliteal and anterior tibial arteries.  She denies any claudication symptoms presently.  She will continue current therapy.  I will see her in 1 year for reevaluation or sooner as needed.   Troy Sine, MD, [facility]  07/28/2021 8:38 AM

## 2021-07-10 NOTE — Patient Instructions (Signed)
Medication Instructions:  Continue current medications. No changes. *If you need a refill on your cardiac medications before your next appointment, please call your pharmacy*   Follow-Up: At [facility], you and your health needs are our priority.  As part of our continuing mission to provide you with exceptional heart care, we have created designated Provider Care Teams.  These Care Teams include your primary Cardiologist (physician) and Advanced Practice Providers (APPs -  Physician Assistants and Nurse Practitioners) who all work together to provide you with the care you need, when you need it.  We recommend signing up for the patient portal called "MyChart".  Sign up information is provided on this After Visit Summary.  MyChart is used to connect with patients for Virtual Visits (Telemedicine).  Patients are able to view lab/test results, encounter notes, upcoming appointments, etc.  Non-urgent messages can be sent to your provider as well.   To learn more about what you can do with MyChart, go to [email or web address].    Your next appointment:   12 month(s)  The format for your next appointment:   In Person  Provider:   Shelva Majestic, MD

## 2021-07-11 ENCOUNTER — Other Ambulatory Visit: Payer: Self-pay | Admitting: Internal Medicine

## 2021-07-11 ENCOUNTER — Other Ambulatory Visit: Payer: Self-pay | Admitting: Cardiovascular Disease

## 2021-07-11 DIAGNOSIS — I10 Essential (primary) hypertension: Secondary | ICD-10-CM

## 2021-07-11 DIAGNOSIS — I480 Paroxysmal atrial fibrillation: Secondary | ICD-10-CM

## 2021-07-28 ENCOUNTER — Encounter: Payer: Self-pay | Admitting: Cardiovascular Disease

## 2021-07-30 ENCOUNTER — Other Ambulatory Visit: Payer: Self-pay | Admitting: Internal Medicine

## 2021-07-30 ENCOUNTER — Telehealth: Payer: Self-pay | Admitting: Internal Medicine

## 2021-07-30 DIAGNOSIS — M818 Other osteoporosis without current pathological fracture: Secondary | ICD-10-CM

## 2021-07-30 MED ORDER — FOSTEUM PLUS PO CAPS: 1.0000 | ORAL_CAPSULE | Freq: Two times a day (BID) | ORAL | 1 refills | Status: DC

## 2021-07-30 NOTE — Telephone Encounter (Signed)
Patient states [facility] is now merging w/ [facility]  Patient states [facility] requires a new rx for Dietary Management Product (FOSTEUM PLUS) CAPS   Phone [phone]

## 2021-09-17 DIAGNOSIS — H538 Other visual disturbances: Secondary | ICD-10-CM | POA: Diagnosis not present

## 2021-09-17 DIAGNOSIS — Z961 Presence of intraocular lens: Secondary | ICD-10-CM | POA: Diagnosis not present

## 2021-10-01 ENCOUNTER — Other Ambulatory Visit: Payer: Self-pay | Admitting: Internal Medicine

## 2021-10-01 DIAGNOSIS — L659 Nonscarring hair loss, unspecified: Secondary | ICD-10-CM

## 2021-11-13 ENCOUNTER — Encounter: Payer: Self-pay | Admitting: Internal Medicine

## 2021-11-13 ENCOUNTER — Other Ambulatory Visit: Payer: Self-pay

## 2021-11-13 ENCOUNTER — Ambulatory Visit (INDEPENDENT_AMBULATORY_CARE_PROVIDER_SITE_OTHER): Payer: Medicare HMO | Admitting: Internal Medicine

## 2021-11-13 ENCOUNTER — Ambulatory Visit (INDEPENDENT_AMBULATORY_CARE_PROVIDER_SITE_OTHER): Payer: Medicare HMO

## 2021-11-13 VITALS — Ht 66.0 in | Wt 182.0 lb

## 2021-11-13 VITALS — BP 136/78 | HR 63 | Temp 98.1°F | Ht 66.0 in | Wt 186.0 lb

## 2021-11-13 DIAGNOSIS — G320 Subacute combined degeneration of spinal cord in diseases classified elsewhere: Secondary | ICD-10-CM | POA: Diagnosis not present

## 2021-11-13 DIAGNOSIS — E785 Hyperlipidemia, unspecified: Secondary | ICD-10-CM

## 2021-11-13 DIAGNOSIS — Z0000 Encounter for general adult medical examination without abnormal findings: Secondary | ICD-10-CM

## 2021-11-13 DIAGNOSIS — I480 Paroxysmal atrial fibrillation: Secondary | ICD-10-CM

## 2021-11-13 DIAGNOSIS — I10 Essential (primary) hypertension: Secondary | ICD-10-CM | POA: Diagnosis not present

## 2021-11-13 DIAGNOSIS — E538 Deficiency of other specified B group vitamins: Secondary | ICD-10-CM | POA: Diagnosis not present

## 2021-11-13 DIAGNOSIS — G629 Polyneuropathy, unspecified: Secondary | ICD-10-CM | POA: Diagnosis not present

## 2021-11-13 MED ORDER — AMITRIPTYLINE HCL 10 MG PO TABS: 10.0000 mg | ORAL_TABLET | Freq: Every day | ORAL | 1 refills | Status: DC

## 2021-11-13 NOTE — Progress Notes (Signed)
? ?Subjective:  ? [name] is a 88 y.o. female who presents for Medicare Annual (Subsequent) preventive examination. ? ?Review of Systems    ?Virtual Visit via Telephone Note ? ?I connected with  [name] on 11/13/21 at  1:00 PM EDT by telephone and verified that I am speaking with the correct person using two identifiers. ? ?Location: ?Patient: Home ?Provider: Office ?Persons participating in the virtual visit: patient/Nurse Health Advisor ?  ?I discussed the limitations, risks, security and privacy concerns of performing an evaluation and management service by telephone and the availability of in person appointments. The patient expressed understanding and agreed to proceed. ? ?Interactive audio and video telecommunications were attempted between this nurse and patient, however failed, due to patient having technical difficulties OR patient did not have access to video capability.  We continued and completed visit with audio only. ? ?Some vital signs may be absent or patient reported.  ? ?Holly Peaches, LPN  ?Cardiac Risk Factors include: advanced age (>80mn, >>19women);hypertension ? ?   ?Objective:  ?  ?Today's Vitals  ? 11/13/21 1233  ?Weight: 182 lb (82.6 kg)  ?Height: '5\' 6"'$  (1.676 m)  ? ?Body mass index is 29.38 kg/m?. ? ?Advanced Directives 11/13/2021 07/10/2017 07/07/2017 04/09/2017 01/15/2017 12/29/2016 04/20/2015  ?Does Patient Have a Medical Advance Directive? Yes No No Yes Yes Yes Yes  ?Type of [place] will - - Living will [place] will -  ?Does patient want to make changes to medical advance directive? No - Patient declined - - - - - -  ?Copy of [place]? No - copy requested - - No - copy requested - Yes -  ?Would patient like information on creating a medical advance directive? - No - Patient declined No - Patient declined - - - -  ? ? ?Current  Medications (verified) ?Outpatient Encounter Medications as of 11/13/2021  ?Medication Sig  ? acetaminophen (TYLENOL) 500 MG tablet Take 1,000 mg by mouth every 6 (six) hours as needed for headache (pain).  ? Calcium Carbonate-Vitamin D (CALCIUM-D PO) Take 1 tablet by mouth at bedtime. Calcium 630 mg, Vitamin D3 500 units  ? chlorthalidone (HYGROTON) 25 MG tablet TAKE 1 TABLET EVERY DAY  ? cyanocobalamin 2000 MCG tablet Take 1 tablet (2,000 mcg total) by mouth every other day.  ? diclofenac sodium (VOLTAREN) 1 % GEL Apply 2 g topically 4 (four) times daily.  ? Dietary Management Product (FOSTEUM PLUS) CAPS Take 1 capsule by mouth 2 (two) times daily.  ? famotidine (PEPCID) 40 MG tablet TAKE 1 TABLET (40 MG TOTAL) BY MOUTH AT BEDTIME.  ? finasteride (PROPECIA) 1 MG tablet TAKE 1 TABLET AT BEDTIME  ? metoprolol tartrate (LOPRESSOR) 25 MG tablet TAKE 1 AND 1/2 TABLETS IN THE MORNING  AND TAKE 1 TABLET AT BEDTIME  ? Pitavastatin Calcium (LIVALO) 2 MG TABS Take 1 tablet (2 mg total) by mouth daily.  ? potassium chloride (KLOR-CON) 10 MEQ tablet TAKE 2 TABLETS DAILY (DOSE INCREASED DUE TO HYPOKALEMIA)  ? rivaroxaban (XARELTO) 20 MG TABS tablet Take 1 tablet (20 mg total) by mouth daily with supper.  ? sotalol (BETAPACE) 120 MG tablet TAKE 1 TABLET EVERY 12 HOURS  ? traMADol (ULTRAM) 50 MG tablet TAKE 1 TABLET BY MOUTH EVERY 6 HOURS AS NEEDED  ? ?No facility-administered encounter medications on file as of 11/13/2021.  ? ? ?Allergies (verified) ?Crestor [  rosuvastatin calcium], Acetaminophen-codeine, Amlodipine besylate, Hydrocodone-acetaminophen, Irbesartan-hydrochlorothiazide, Lisinopril, Omeprazole, Propoxyphene n-acetaminophen, Valsartan, Warfarin sodium, Diltiazem hcl, and Verapamil  ? ?History: ?Past Medical History:  ?Diagnosis Date  ? Abnormal nuclear stress test   ? mild anterolateral septal and inferior ischemia  ? Arthritis   ? Atherosclerosis of lower extremity with claudication ([place]) 04/17/2015  ? LEFT LEG  ?  Atrial fibrillation ([place])   ? Claudication [facility])   ? Colon cancer ([place])   ? Coronary atherosclerosis of unspecified type of vessel, native or graft   ? Esophageal stricture   ? Fatty liver 11/05/11  ? Hiatal hernia   ? HLD (hyperlipidemia)   ? Hypertension   ? Iron deficiency anemia, unspecified   ? Ischemic colitis ([place])   ? Neuropathy   ? Osteoporosis   ? PAF (paroxysmal atrial fibrillation) ([place]) 04/2015  ? Unspecified vascular insufficiency of intestine   ? ?Past Surgical History:  ?Procedure Laterality Date  ? abdominoperineal resection anastomotic stricture  05/2007  ? APPENDECTOMY    ? CARDIAC CATHETERIZATION N/A 03/13/2015  ? Procedure: Left Heart Cath and Coronary Angiography;  Surgeon: Troy Sine, MD;  Location: [facility];  Service: Cardiovascular;  Laterality: N/A;  ? CARDIOVERSION N/A 04/20/2015  ? Procedure: CARDIOVERSION;  Surgeon: Jerline Pain, MD;  Location: [facility];  Service: Cardiovascular;  Laterality: N/A;  ? CARDIOVERSION N/A 07/10/2017  ? Procedure: CARDIOVERSION;  Surgeon: Jerline Pain, MD;  Location: [facility] END[facility];  Service: Cardiovascular;  Laterality: N/A;  ? [place]  ? ESOPHAGOGASTRODUODENOSCOPY N/A 04/27/2014  ? Procedure: ESOPHAGOGASTRODUODENOSCOPY (EGD);  Surgeon: Lafayette Dragon, MD;  Location: [facility];  Service: Endoscopy;  Laterality: N/A;  ? Mandibular Renstruction    ? ROTATOR CUFF REPAIR  2006  ? left  ? Rt. Salpingo oophorectomy and cyst removal  2005  ? SAVORY DILATION N/A 04/27/2014  ? Procedure: SAVORY DILATION;  Surgeon: Lafayette Dragon, MD;  Location: [facility] ENDOSCOPY;  Service: Endoscopy;  Laterality: N/A;  no xray needed  ? sigmoid resection for invasive rectal adenocarcinoma  12/2006  ? TEE WITHOUT CARDIOVERSION N/A 04/20/2015  ? Procedure: TRANSESOPHAGEAL ECHOCARDIOGRAM (TEE);  Surgeon: Jerline Pain, MD;  Location: [facility] ENDOSCOPY;  Service: Cardiovascular;  Laterality: N/A;  ? TUBAL LIGATION    ? WRIST SURGERY  2008  ? right  ? ?Family  History  ?Problem Relation Age of Onset  ? Colon cancer Father 78  ? Heart disease Father   ? Hypertension Father   ? Alzheimer's disease Mother   ? Thyroid disease Mother   ? Hypertension Sister   ? Thyroid disease Sister   ? Mitral valve prolapse Sister   ? Hypertension Son   ? Diabetes Son   ? Hypertension Son   ? Diabetes Son   ? Esophageal cancer Neg Hx   ? Rectal cancer Neg Hx   ? Stomach cancer Neg Hx   ? ?Social History  ? ?Socioeconomic History  ? Marital status: Widowed  ?  Spouse name: Not on file  ? Number of children: 4  ? Years of education: 12th grade  ? Highest education level: Not on file  ?Occupational History  ? Occupation: retired  ?  Employer: RETIRED  ?Tobacco Use  ? Smoking status: Never  ? Smokeless tobacco: Never  ?Vaping Use  ? Vaping Use: Never used  ?Substance and Sexual Activity  ? Alcohol use: No  ? Drug use: No  ? Sexual activity: Not Currently  ?  Other Topics Concern  ? Not on file  ?Social History Narrative  ? Lives at home with fiance.  ? Right-handed.  ? No caffeine use.  ? ?Social Determinants of Health  ? ?Financial Resource Strain: Low Risk   ? Difficulty of Paying Living Expenses: Not hard at all  ?Food Insecurity: No Food Insecurity  ? Worried About Charity fundraiser in the Last Year: Never true  ? Ran Out of Food in the Last Year: Never true  ?Transportation Needs: No Transportation Needs  ? Lack of Transportation (Medical): No  ? Lack of Transportation (Non-Medical): No  ?Physical Activity: Inactive  ? Days of Exercise per Week: 0 days  ? Minutes of Exercise per Session: 0 min  ?Stress: No Stress Concern Present  ? Feeling of Stress : Not at all  ?Social Connections: Moderately Integrated  ? Frequency of Communication with Friends and Family: More than three times a week  ? Frequency of Social Gatherings with Friends and Family: More than three times a week  ? Attends Religious Services: More than 4 times per year  ? Active Member of Clubs or Organizations: Yes  ? Attends  Archivist Meetings: More than 4 times per year  ? Marital Status: Widowed  ? ? ?Clinical Intake: ? ?Pre-visit preparation completed: No ? ?Diabetic?  No ? ?Interpreter Needed?: No ?Activities of Da

## 2021-11-13 NOTE — Patient Instructions (Addendum)
?[name] , ?Thank you for taking time to come for your Medicare Wellness Visit. I appreciate your ongoing commitment to your health goals. Please review the following plan we discussed and let me know if I can assist you in the future.  ? ?These are the goals we discussed: ? Goals   ? ?   Maintain good health (pt-stated)   ? ?  ?  ?This is a list of the screening recommended for you and due dates:  ?Health Maintenance  ?Topic Date Due  ? Flu Shot  11/29/2021*  ? COVID-19 Vaccine (1) 11/29/2021*  ? Zoster (Shingles) Vaccine (1 of 2) 02/13/2022*  ? Tetanus Vaccine  05/15/2022*  ? Pneumonia Vaccine (1 - PCV) 11/14/2022*  ? HPV Vaccine  Aged Out  ? DEXA scan (bone density measurement)  Discontinued  ?*Topic was postponed. The date shown is not the original due date.  ? ?Opioid Pain Medicine Management ?Opioids are powerful medicines that are used to treat moderate to severe pain. When used for short periods of time, they can help you to: ?Sleep better. ?Do better in physical or occupational therapy. ?Feel better in the first few days after an injury. ?Recover from surgery. ?Opioids should be taken with the supervision of a trained health care provider. They should be taken for the shortest period of time possible. This is because opioids can be addictive, and the longer you take opioids, the greater your risk of addiction. This addiction can also be called opioid use disorder. ?What are the risks? ?Using opioid pain medicines for longer than 3 days increases your risk of side effects. Side effects include: ?Constipation. ?Nausea and vomiting. ?Breathing difficulties (respiratory depression). ?Drowsiness. ?Confusion. ?Opioid use disorder. ?Itching. ?Taking opioid pain medicine for a long period of time can affect your ability to do daily tasks. It also puts you at risk for: ?Motor vehicle crashes. ?Depression. ?Suicide. ?Heart attack. ?Overdose, which can be life-threatening. ?What is a pain treatment plan? ?A pain  treatment plan is an agreement between you and your health care provider. Pain is unique to each person, and treatments vary depending on your condition. To manage your pain, you and your health care provider need to work together. To help you do this: ?Discuss the goals of your treatment, including how much pain you might expect to have and how you will manage the pain. ?Review the risks and benefits of taking opioid medicines. ?Remember that a good treatment plan uses more than one approach and minimizes the chance of side effects. ?Be honest about the amount of medicines you take and about any drug or alcohol use. ?Get pain medicine prescriptions from only one health care provider. ?Pain can be managed with many types of alternative treatments. Ask your health care provider to refer you to one or more specialists who can help you manage pain through: ?Physical or occupational therapy. ?Counseling (cognitive behavioral therapy). ?Good nutrition. ?Biofeedback. ?Massage. ?Meditation. ?Non-opioid medicine. ?Following a gentle exercise program. ?How to use opioid pain medicine ?Taking medicine ?Take your pain medicine exactly as told by your health care provider. Take it only when you need it. ?If your pain gets less severe, you may take less than your prescribed dose if your health care provider approves. ?If you are not having pain, do nottake pain medicine unless your health care provider tells you to take it. ?If your pain is severe, do nottry to treat it yourself by taking more pills than instructed on your prescription. Contact your health  care provider for help. ?Write down the times when you take your pain medicine. It is easy to become confused while on pain medicine. Writing the time can help you avoid overdose. ?Take other over-the-counter or prescription medicines only as told by your health care provider. ?Keeping yourself and others safe ? ?While you are taking opioid pain medicine: ?Do not drive, use  machinery, or power tools. ?Do not sign legal documents. ?Do not drink alcohol. ?Do not take sleeping pills. ?Do not supervise children by yourself. ?Do not do activities that require climbing or being in high places. ?Do not go to a lake, river, ocean, spa, or swimming pool. ?Do not share your pain medicine with anyone. ?Keep pain medicine in a locked cabinet or in a secure area where pets and children cannot reach it. ?Stopping your use of opioids ?If you have been taking opioid medicine for more than a few weeks, you may need to slowly decrease (taper) how much you take until you stop completely. Tapering your use of opioids can decrease your risk of symptoms of withdrawal, such as: ?Pain and cramping in the abdomen. ?Nausea. ?Sweating. ?Sleepiness. ?Restlessness. ?Uncontrollable shaking (tremors). ?Cravings for the medicine. ?Do not attempt to taper your use of opioids on your own. Talk with your health care provider about how to do this. Your health care provider may prescribe a step-down schedule based on how much medicine you are taking and how long you have been taking it. ?Getting rid of leftover pills ?Do not save any leftover pills. Get rid of leftover pills safely by: ?Taking the medicine to a prescription take-back program. This is usually offered by the county or law enforcement. ?Bringing them to a pharmacy that has a drug disposal container. ?Flushing them down the toilet. Check the label or package insert of your medicine to see whether this is safe to do. ?Throwing them out in the trash. Check the label or package insert of your medicine to see whether this is safe to do. ?If it is safe to throw it out, remove the medicine from the original container, put it into a sealable bag or container, and mix it with used coffee grounds, food scraps, dirt, or cat litter before putting it in the trash. ?Follow these instructions at home: ?Activity ?Do exercises as told by your health care provider. ?Avoid  activities that make your pain worse. ?Return to your normal activities as told by your health care provider. Ask your health care provider what activities are safe for you. ?General instructions ?You may need to take these actions to prevent or treat constipation: ?Drink enough fluid to keep your urine pale yellow. ?Take over-the-counter or prescription medicines. ?Eat foods that are high in fiber, such as beans, whole grains, and fresh fruits and vegetables. ?Limit foods that are high in fat and processed sugars, such as fried or sweet foods. ?Keep all follow-up visits. This is important. ?Where to find support ?If you have been taking opioids for a long time, you may benefit from receiving support for quitting from a local support group or counselor. Ask your health care provider for a referral to these resources in your area. ?Where to find more information ?Centers for Disease Control and Prevention (CDC): http://www.wolf.info/ ?U.[place] Food and Drug Administration (FDA): GuamGaming.ch ?Get help right away if: ?You may have taken too much of an opioid (overdosed). Common symptoms of an overdose: ?Your breathing is slower or more shallow than normal. ?You have a very slow heartbeat (pulse). ?You  have slurred speech. ?You have nausea and vomiting. ?Your pupils become very small. ?You have other potential symptoms: ?You are very confused. ?You faint or feel like you will faint. ?You have cold, clammy skin. ?You have blue lips or fingernails. ?You have thoughts of harming yourself or harming others. ?These symptoms may represent a serious problem that is an emergency. Do not wait to see if the symptoms will go away. Get medical help right away. Call your local emergency services (911 in the [place] Do not drive yourself to the hospital.  ?If you ever feel like you may hurt yourself or others, or have thoughts about taking your own life, get help right away. Go to your nearest emergency department or: ?Call your local emergency  services (911 in the [place] ?Call the [facility] [phone] in the [place] ?Call a suicide crisis helpline, such as the [facility] at 984 600 4884 or 988 in

## 2021-11-13 NOTE — Patient Instructions (Signed)
Neuropathic Pain °Neuropathic pain is pain caused by damage to the nerves that are responsible for certain sensations in your body (sensory nerves). °Neuropathic pain can make you more sensitive to pain. Even a minor sensation can feel very painful. This is usually a long-term (chronic) condition that can be difficult to treat. The type of pain differs from person to person. It may: °Start suddenly (acute), or it may develop slowly and become chronic. °Come and go as damaged nerves heal, or it may stay at the same level for years. °Cause emotional distress, loss of sleep, and a lower quality of life. °What are the causes? °The most common cause of this condition is diabetes. Many other diseases and conditions can also cause neuropathic pain. Causes of neuropathic pain can be classified as: °Toxic. This is caused by medicines and chemicals. The most common causes of toxic neuropathic pain is damage from medicines that kill cancer cells (chemotherapy) or alcohol abuse. °Metabolic. This can be caused by: °Diabetes. °Lack of vitamins like B12. °Traumatic. Any injury that cuts, crushes, or stretches a nerve can cause damage and pain. °Compression-related. If a sensory nerve gets trapped or compressed for a long period of time, the blood supply to the nerve can be cut off. °Vascular. Many blood vessel diseases can cause neuropathic pain by decreasing blood supply and oxygen to nerves. °Autoimmune. This type of pain results from diseases in which the body's defense system (immune system) mistakenly attacks sensory nerves. Examples of autoimmune diseases that can cause neuropathic pain include lupus and multiple sclerosis. °Infectious. Many types of viral infections can damage sensory nerves and cause pain. Shingles infection is a common cause of this type of pain. °Inherited. Neuropathic pain can be a symptom of many diseases that are passed down through families (genetic). °What increases the risk? °You are more likely to  develop this condition if: °You have diabetes. °You smoke. °You drink too much alcohol. °You are taking certain medicines, including chemotherapy or medicines that treat immune system disorders. °What are the signs or symptoms? °The main symptom is pain. Neuropathic pain is often described as: °Burning. °Shock-like. °Stinging. °Hot or cold. °Itching. °How is this diagnosed? °No single test can diagnose neuropathic pain. It is diagnosed based on: °A physical exam and your symptoms. Your health care provider will ask you about your pain. You may be asked to use a pain scale to describe how bad your pain is. °Tests. These may be done to see if you have a cause and location of any nerve damage. They include: °Nerve conduction studies and electromyography to test how well nerve signals travel through your nerves and muscles (electrodiagnostic testing). °Skin biopsy to evaluate for small fiber neuropathy. °Imaging studies, such as: °X-rays. °CT scan. °MRI. °How is this treated? °Treatment for neuropathic pain may change over time. You may need to try different treatment options or a combination of treatments. Some options include: °Treating the underlying cause of the neuropathy, such as diabetes, kidney disease, or vitamin deficiencies. °Stopping medicines that can cause neuropathy, such as chemotherapy. °Medicine to relieve pain. Medicines may include: °Prescription or over-the-counter pain medicine. °Anti-seizure medicine. °Antidepressant medicines. °Pain-relieving patches or creams that are applied to painful areas of skin. °A medicine to numb the area (local anesthetic), which can be injected as a nerve block. °Transcutaneous nerve stimulation. This uses electrical currents to block painful nerve signals. The treatment is painless. °Alternative treatments, such as: °Acupuncture. °Meditation. °Massage. °Occupational or physical therapy. °Pain management programs. °Counseling. °Follow   these instructions at  home: °Medicines ° °Take over-the-counter and prescription medicines only as told by your health care provider. °Ask your health care provider if the medicine prescribed to you: °Requires you to avoid driving or using machinery. °Can cause constipation. You may need to take these actions to prevent or treat constipation: °Drink enough fluid to keep your urine pale yellow. °Take over-the-counter or prescription medicines. °Eat foods that are high in fiber, such as beans, whole grains, and fresh fruits and vegetables. °Limit foods that are high in fat and processed sugars, such as fried or sweet foods. °Lifestyle ° °Have a good support system at home. °Consider joining a chronic pain support group. °Do not use any products that contain nicotine or tobacco. These products include cigarettes, chewing tobacco, and vaping devices, such as e-cigarettes. If you need help quitting, ask your health care provider. °Do not drink alcohol. °General instructions °Learn as much as you can about your condition. °Work closely with all your health care providers to find the treatment plan that works best for you. °Ask your health care provider what activities are safe for you. °Keep all follow-up visits. This is important. °Contact a health care provider if: °Your pain treatments are not working. °You are having side effects from your medicines. °You are struggling with tiredness (fatigue), mood changes, depression, or anxiety. °Get help right away if: °You have thoughts of hurting yourself. °Get help right away if you feel like you may hurt yourself or others, or have thoughts about taking your own life. Go to your nearest emergency room or: °Call 911. °Call the National Suicide Prevention Lifeline at 1-800-273-8255 or 988. This is open 24 hours a day. °Text the Crisis Text Line at 741741. °Summary °Neuropathic pain is pain caused by damage to the nerves that are responsible for certain sensations in your body (sensory  nerves). °Neuropathic pain may come and go as damaged nerves heal, or it may stay at the same level for years. °Neuropathic pain is usually a long-term condition that can be difficult to treat. Consider joining a chronic pain support group. °This information is not intended to replace advice given to you by your health care provider. Make sure you discuss any questions you have with your health care provider. °Document Revised: 04/15/2021 Document Reviewed: 04/15/2021 °Elsevier Patient Education © 2022 [employer] ° °

## 2021-11-13 NOTE — Progress Notes (Signed)
? ?Subjective:  ?Patient ID: [name], female    DOB: [birth year]  Age: 88 y.o. MRN: [id number] ? ?CC: Hyperlipidemia and Atrial Fibrillation ? ?This visit occurred during the SARS-CoV-2 public health emergency.  Safety protocols were in place, including screening questions prior to the visit, additional usage of staff PPE, and extensive cleaning of exam room while observing appropriate contact time as indicated for disinfecting solutions.   ? ?HPI ?[name] presents for f/up - ? ?She complains of persistent and worsening neuropathy pain over the last 3 to 4 months.  She tried and did not tolerate Lyrica and gabapentin.  She is active and denies chest pain, shortness of breath, palpitations, diaphoresis, edema, or fatigue. ? ?Outpatient Medications Prior to Visit  ?Medication Sig Dispense Refill  ? acetaminophen (TYLENOL) 500 MG tablet Take 1,000 mg by mouth every 6 (six) hours as needed for headache (pain).    ? Calcium Carbonate-Vitamin D (CALCIUM-D PO) Take 1 tablet by mouth at bedtime. Calcium 630 mg, Vitamin D3 500 units    ? chlorthalidone (HYGROTON) 25 MG tablet TAKE 1 TABLET EVERY DAY 90 tablet 0  ? cyanocobalamin 2000 MCG tablet Take 1 tablet (2,000 mcg total) by mouth every other day. 45 tablet 1  ? diclofenac sodium (VOLTAREN) 1 % GEL Apply 2 g topically 4 (four) times daily. 100 g 1  ? Dietary Management Product (FOSTEUM PLUS) CAPS Take 1 capsule by mouth 2 (two) times daily. 180 capsule 1  ? famotidine (PEPCID) 40 MG tablet TAKE 1 TABLET (40 MG TOTAL) BY MOUTH AT BEDTIME. 90 tablet 1  ? finasteride (PROPECIA) 1 MG tablet TAKE 1 TABLET AT BEDTIME 90 tablet 1  ? metoprolol tartrate (LOPRESSOR) 25 MG tablet TAKE 1 AND 1/2 TABLETS IN THE MORNING  AND TAKE 1 TABLET AT BEDTIME 225 tablet 0  ? potassium chloride (KLOR-CON) 10 MEQ tablet TAKE 2 TABLETS DAILY (DOSE INCREASED DUE TO HYPOKALEMIA) 180 tablet 1  ? sotalol (BETAPACE) 120 MG tablet TAKE 1 TABLET EVERY 12 HOURS 180 tablet 3  ? traMADol  (ULTRAM) 50 MG tablet TAKE 1 TABLET BY MOUTH EVERY 6 HOURS AS NEEDED 90 tablet 5  ? Pitavastatin Calcium (LIVALO) 2 MG TABS Take 1 tablet (2 mg total) by mouth daily. 90 tablet 1  ? rivaroxaban (XARELTO) 20 MG TABS tablet Take 1 tablet (20 mg total) by mouth daily with supper. 90 tablet 1  ? ?No facility-administered medications prior to visit.  ? ? ?ROS ?Review of Systems  ?Constitutional:  Negative for fatigue.  ?HENT: Negative.    ?Eyes: Negative.   ?Respiratory:  Negative for cough, chest tightness, shortness of breath and wheezing.   ?Cardiovascular:  Negative for chest pain, palpitations and leg swelling.  ?Gastrointestinal:  Negative for abdominal pain, diarrhea and nausea.  ?Endocrine: Negative.   ?Musculoskeletal: Negative.   ?Skin: Negative.   ?Allergic/Immunologic: Negative.   ?Neurological: Negative.  Negative for dizziness.  ?Hematological:  Negative for adenopathy. Does not bruise/bleed easily.  ?Psychiatric/Behavioral: Negative.    ? ?Objective:  ?BP 136/78 (BP Location: Right Arm, Patient Position: Sitting, Cuff Size: Large)   Pulse 63   Temp 98.1 ?F (36.7 ?C) (Oral)   Ht '5\' 6"'$  (1.676 m)   Wt 186 lb (84.4 kg)   SpO2 98%   BMI 30.02 kg/m?  ? ?BP Readings from Last 3 Encounters:  ?11/13/21 136/78  ?07/10/21 138/76  ?05/15/21 (!) 144/72  ? ? ?Wt Readings from Last 3 Encounters:  ?11/13/21 186 lb (84.4  kg)  ?11/13/21 182 lb (82.6 kg)  ?07/10/21 189 lb (85.7 kg)  ? ? ?Physical Exam ?Vitals reviewed.  ?HENT:  ?   Nose: Nose normal.  ?   Mouth/Throat:  ?   Mouth: Mucous membranes are moist.  ?Eyes:  ?   General: No scleral icterus. ?   Conjunctiva/sclera: Conjunctivae normal.  ?Cardiovascular:  ?   Rate and Rhythm: Normal rate and regular rhythm.  ?   Heart sounds: No murmur heard. ?Pulmonary:  ?   Effort: Pulmonary effort is normal.  ?   Breath sounds: No stridor. No wheezing, rhonchi or rales.  ?Abdominal:  ?   General: Abdomen is flat.  ?   Palpations: There is no mass.  ?   Tenderness: There is no  abdominal tenderness. There is no guarding.  ?   Hernia: No hernia is present.  ?Musculoskeletal:     ?   General: Normal range of motion.  ?   Cervical back: Neck supple.  ?   Right lower leg: No edema.  ?   Left lower leg: No edema.  ?Skin: ?   General: Skin is warm and dry.  ?Neurological:  ?   General: No focal deficit present.  ?Psychiatric:     ?   Mood and Affect: Mood normal.     ?   Behavior: Behavior normal.  ? ? ?Lab Results  ?Component Value Date  ? WBC 5.6 05/15/2021  ? HGB 14.3 05/15/2021  ? HCT 42.8 05/15/2021  ? PLT 177.0 05/15/2021  ? GLUCOSE 94 05/15/2021  ? CHOL 180 05/15/2021  ? TRIG 159.0 (H) 05/15/2021  ? HDL 53.80 05/15/2021  ? LDLDIRECT 150.0 12/28/2017  ? [place] 95 05/15/2021  ? ALT 14 05/15/2021  ? AST 20 05/15/2021  ? NA 140 05/15/2021  ? K 3.5 05/15/2021  ? CL 102 05/15/2021  ? CREATININE 0.85 05/15/2021  ? BUN 19 05/15/2021  ? CO2 30 05/15/2021  ? TSH 1.39 05/15/2021  ? INR 1.02 07/07/2017  ? HGBA1C 5.5 12/18/2016  ? ? ?CT SHOULDER RIGHT WO CONTRAST ? ?Result Date: 06/06/2019 ?CLINICAL DATA:  Chronic right shoulder pain. EXAM: CT OF THE UPPER RIGHT EXTREMITY WITHOUT CONTRAST TECHNIQUE: Multidetector CT imaging of the upper right extremity was performed according to the standard protocol. COMPARISON:  Right shoulder x-rays dated April 20, 2019. MRI right shoulder dated September 18, 2014. FINDINGS: Bones/Joint/Cartilage No fracture or dislocation. Normal alignment. Small joint effusion. Mild osteoarthritis of the glenohumeral joint with mild joint space narrowing and marginal osteophytosis. Chondrocalcinosis. Moderate arthropathy of the acromioclavicular joint. Type II acromion. Ligaments Ligaments are suboptimally evaluated by CT. Muscles and Tendons Moderate infraspinatus and mild supraspinatus muscle atrophy. Peritendinous calcification along the subscapularis myotendinous junction. Soft tissue No fluid collection or hematoma.  No soft tissue mass. IMPRESSION: 1. Mild glenohumeral  osteoarthritis. Chondrocalcinosis could reflect underlying CPPD arthropathy. 2. Moderate infraspinatus and mild supraspinatus muscle atrophy related to underlying full-thickness tendon tears seen on prior MRI from January 2016. 3. Moderate acromioclavicular osteoarthritis. Electronically Signed   By: Titus Dubin M.D.   On: 06/06/2019 19:43  ? ? ?Assessment & Plan:  ? ?Chevon was seen today for hyperlipidemia and atrial fibrillation. ? ?Diagnoses and all orders for this visit: ? ?Essential hypertension- Her blood pressure is adequately well controlled. ? ?Neuromyelopathy due to vitamin B12 deficiency (HCC) ?-     amitriptyline (ELAVIL) 10 MG tablet; Take 1 tablet (10 mg total) by mouth at bedtime. ? ?Neuropathy- Will try a TCA. ?-  amitriptyline (ELAVIL) 10 MG tablet; Take 1 tablet (10 mg total) by mouth at bedtime. ? ?Dyslipidemia, goal LDL below 100- LDL goal achieved. Doing well on the statin  ?-     Pitavastatin Calcium (LIVALO) 2 MG TABS; Take 1 tablet (2 mg total) by mouth daily. ? ?Paroxysmal atrial fibrillation ([place])- She has good rate and rhythm control.  Will continue the DOAC.  ?-     rivaroxaban (XARELTO) 20 MG TABS tablet; Take 1 tablet (20 mg total) by mouth daily with supper. ? ? ?I am having Nargis Abrams. Kangas start on amitriptyline. I am also having her maintain her Calcium Carbonate-Vitamin D (CALCIUM-D PO), acetaminophen, diclofenac sodium, famotidine, cyanocobalamin, traMADol, potassium chloride, metoprolol tartrate, sotalol, chlorthalidone, Fosteum Plus, finasteride, Livalo, and rivaroxaban. ? ?Meds ordered this encounter  ?Medications  ? amitriptyline (ELAVIL) 10 MG tablet  ?  Sig: Take 1 tablet (10 mg total) by mouth at bedtime.  ?  Dispense:  90 tablet  ?  Refill:  1  ? Pitavastatin Calcium (LIVALO) 2 MG TABS  ?  Sig: Take 1 tablet (2 mg total) by mouth daily.  ?  Dispense:  90 tablet  ?  Refill:  1  ? rivaroxaban (XARELTO) 20 MG TABS tablet  ?  Sig: Take 1 tablet (20 mg total) by mouth  daily with supper.  ?  Dispense:  90 tablet  ?  Refill:  1  ? ? ? ?Follow-up: Return in about 6 months (around 05/16/2022). ? ?[name], MD ?

## 2021-11-14 MED ORDER — LIVALO 2 MG PO TABS: 2.0000 mg | ORAL_TABLET | Freq: Every day | ORAL | 1 refills | Status: DC

## 2021-11-14 MED ORDER — RIVAROXABAN 20 MG PO TABS: 20.0000 mg | ORAL_TABLET | Freq: Every day | ORAL | 1 refills | Status: DC

## 2021-12-23 DIAGNOSIS — H903 Sensorineural hearing loss, bilateral: Secondary | ICD-10-CM | POA: Diagnosis not present

## 2021-12-23 DIAGNOSIS — H9313 Tinnitus, bilateral: Secondary | ICD-10-CM | POA: Diagnosis not present

## 2021-12-30 ENCOUNTER — Encounter (HOSPITAL_COMMUNITY): Payer: Self-pay

## 2021-12-30 ENCOUNTER — Other Ambulatory Visit: Payer: Self-pay

## 2021-12-30 ENCOUNTER — Emergency Department (HOSPITAL_COMMUNITY): Payer: Medicare HMO

## 2021-12-30 ENCOUNTER — Emergency Department (HOSPITAL_COMMUNITY)
Admission: EM | Admit: 2021-12-30 | Discharge: 2021-12-30 | Disposition: A | Discharge status: Home / Self Care | Payer: Medicare HMO | Attending: Emergency Medicine | Admitting: Emergency Medicine

## 2021-12-30 ENCOUNTER — Telehealth (HOSPITAL_COMMUNITY): Payer: Self-pay

## 2021-12-30 DIAGNOSIS — R000 Tachycardia, unspecified: Secondary | ICD-10-CM | POA: Diagnosis not present

## 2021-12-30 DIAGNOSIS — Z7901 Long term (current) use of anticoagulants: Secondary | ICD-10-CM | POA: Diagnosis not present

## 2021-12-30 DIAGNOSIS — R0789 Other chest pain: Secondary | ICD-10-CM | POA: Diagnosis not present

## 2021-12-30 DIAGNOSIS — Z79899 Other long term (current) drug therapy: Secondary | ICD-10-CM | POA: Diagnosis not present

## 2021-12-30 DIAGNOSIS — I480 Paroxysmal atrial fibrillation: Secondary | ICD-10-CM

## 2021-12-30 LAB — BASIC METABOLIC PANEL
Anion gap: 11 (ref 5–15)
BUN: 16 mg/dL (ref 8–23)
CO2: 27 mmol/L (ref 22–32)
Calcium: 10.4 mg/dL — ABNORMAL HIGH (ref 8.9–10.3)
Chloride: 101 mmol/L (ref 98–111)
Creatinine, Ser: 1.15 mg/dL — ABNORMAL HIGH (ref 0.44–1.00)
GFR, Estimated: 46 mL/min — ABNORMAL LOW (ref >60)
Glucose, Bld: 132 mg/dL — ABNORMAL HIGH (ref 70–99)
Potassium: 3.5 mmol/L (ref 3.5–5.1)
Sodium: 139 mmol/L (ref 135–145)

## 2021-12-30 LAB — CBC
HCT: 50.5 % — ABNORMAL HIGH (ref 36.0–46.0)
Hemoglobin: 17.5 g/dL — ABNORMAL HIGH (ref 12.0–15.0)
MCH: 31.3 pg (ref 26.0–34.0)
MCHC: 34.7 g/dL (ref 30.0–36.0)
MCV: 90.2 fL (ref 80.0–100.0)
Platelets: 218 10*3/uL (ref 150–400)
RBC: 5.6 MIL/uL — ABNORMAL HIGH (ref 3.87–5.11)
RDW: 13.5 % (ref 11.5–15.5)
WBC: 6.9 10*3/uL (ref 4.0–10.5)
nRBC: 0 % (ref 0.0–0.2)

## 2021-12-30 LAB — TROPONIN I (HIGH SENSITIVITY): Troponin I (High Sensitivity): 13 ng/L (ref <18)

## 2021-12-30 LAB — MAGNESIUM: Magnesium: 1.6 mg/dL — ABNORMAL LOW (ref 1.7–2.4)

## 2021-12-30 MED ORDER — MAGNESIUM SULFATE 2 GM/50ML IV SOLN
2.0000 g | Freq: Once | INTRAVENOUS | Status: AC
  Administered 2021-12-30: 2 g via INTRAVENOUS
  Filled 2021-12-30: qty 50

## 2021-12-30 MED ORDER — ETOMIDATE 2 MG/ML IV SOLN
0.1000 mg/kg | Freq: Once | INTRAVENOUS | Status: AC
  Administered 2021-12-30: 8.16 mg via INTRAVENOUS
  Filled 2021-12-30: qty 10

## 2021-12-30 MED ORDER — LACTATED RINGERS IV BOLUS
1000.0000 mL | Freq: Once | INTRAVENOUS | Status: AC
Start: 2021-12-30 — End: 2021-12-30
  Administered 2021-12-30: 1000 mL via INTRAVENOUS

## 2021-12-30 NOTE — Telephone Encounter (Signed)
Reached out to patient to schedule ED f/u. Patient daughter stated that patient going to f/u with her PCP.  ?

## 2021-12-30 NOTE — ED Triage Notes (Signed)
Pt reports she felt her heart racing on Saturday night, hx of a fib. Took metoprolol per her cardiologist but states she still feels like she is in a fib. HR 123 this morning. Pt c.o chest tightness but denies sob or dizziness. Pt a.o ?

## 2021-12-30 NOTE — Progress Notes (Signed)
RT at bedside for a conscious sedation procedure with airway equipment ready if needed. Patient tolerated well.  RN at bedside. ?

## 2021-12-30 NOTE — ED Notes (Signed)
Pt in xray

## 2021-12-30 NOTE — ED Provider Notes (Signed)
?[facility] ?Provider Note ? ? ?CSN: [id number] ?Arrival date & time: 12/30/21  0750 ? ?  ? ?History ? ?Chief Complaint  ?Patient presents with  ? Atrial Fibrillation  ? ? ?[name] is a 88 y.o. female. ? ?HPI ?88 year old female with a history of paroxysmal atrial fibrillation presents with recurrent A-fib.  She reports compliance with her meds including her Xarelto, sotalol and metoprolol.  She took an extra dose of metoprolol after she felt herself going to A-fib on 4/29 at about 9:30 PM.  Since then she is felt continued chest tightness, weakness and a little bit of shortness of breath.  She cannot feel the palpitations a strong but this feels like recurrent paroxysmal A-fib to her.  No leg swelling or recent illness.  No change in her medicines. Last ate last night. ? ?Home Medications ?Prior to Admission medications   ?Medication Sig Start Date End Date Taking? Authorizing Provider  ?acetaminophen (TYLENOL) 500 MG tablet Take 1,000 mg by mouth every 6 (six) hours as needed for headache (pain).   Yes [provider]  ?amitriptyline (ELAVIL) 10 MG tablet Take 1 tablet (10 mg total) by mouth at bedtime. ?Patient taking differently: Take 10 mg by mouth at bedtime as needed for sleep (neuropathy). 11/13/21  Yes Janith Lima, MD  ?Calcium Carbonate-Vitamin D (CALCIUM-D PO) Take 1 tablet by mouth at bedtime. Calcium 630 mg, Vitamin D3 500 units   Yes [provider]  ?chlorthalidone (HYGROTON) 25 MG tablet TAKE 1 TABLET EVERY DAY ?Patient taking differently: Take 25 mg by mouth daily. 07/11/21  Yes Janith Lima, MD  ?cyanocobalamin 2000 MCG tablet Take 1 tablet (2,000 mcg total) by mouth every other day. ?Patient taking differently: Take 2,000 mcg by mouth daily. 01/27/20  Yes Janith Lima, MD  ?diclofenac sodium (VOLTAREN) 1 % GEL Apply 2 g topically 4 (four) times daily. ?Patient taking differently: Apply 2 g topically 4 (four) times daily as  needed (painful joints). 04/20/19  Yes Antonietta Breach, PA-C  ?Dietary Management Product (FOSTEUM PLUS) CAPS Take 1 capsule by mouth 2 (two) times daily. 07/30/21  Yes Janith Lima, MD  ?famotidine (PEPCID) 40 MG tablet TAKE 1 TABLET (40 MG TOTAL) BY MOUTH AT BEDTIME. ?Patient taking differently: Take 40 mg by mouth at bedtime as needed for heartburn. 09/08/19  Yes Janith Lima, MD  ?metoprolol tartrate (LOPRESSOR) 25 MG tablet TAKE 1 AND 1/2 TABLETS IN THE MORNING  AND TAKE 1 TABLET AT BEDTIME ?Patient taking differently: Take 25-37.5 mg by mouth See admin instructions. Take '25mg'$  by mouth in the morning and 37.'5mg'$  at bedtime. 05/14/21  Yes Troy Sine, MD  ?potassium chloride (KLOR-CON) 10 MEQ tablet TAKE 2 TABLETS DAILY (DOSE INCREASED DUE TO HYPOKALEMIA) ?Patient taking differently: Take 10 mEq by mouth 2 (two) times daily. 05/03/21  Yes Troy Sine, MD  ?rivaroxaban (XARELTO) 20 MG TABS tablet Take 1 tablet (20 mg total) by mouth daily with supper. 11/14/21  Yes Janith Lima, MD  ?sotalol (BETAPACE) 120 MG tablet TAKE 1 TABLET EVERY 12 HOURS ?Patient taking differently: Take 120 mg by mouth 2 (two) times daily. 07/11/21  Yes Troy Sine, MD  ?traMADol (ULTRAM) 50 MG tablet TAKE 1 TABLET BY MOUTH EVERY 6 HOURS AS NEEDED ?Patient taking differently: Take 50 mg by mouth every 6 (six) hours as needed for moderate pain. 12/20/20  Yes Janith Lima, MD  ?finasteride (PROPECIA) 1 MG tablet TAKE  1 TABLET AT BEDTIME ?Patient taking differently: Take 1 mg by mouth at bedtime. 10/01/21   Janith Lima, MD  ?Pitavastatin Calcium (LIVALO) 2 MG TABS Take 1 tablet (2 mg total) by mouth daily. ?Patient taking differently: Take 1 mg by mouth daily. 11/14/21   Janith Lima, MD  ?   ? ?Allergies    ?Crestor [rosuvastatin calcium], Acetaminophen-codeine, Amlodipine besylate, Hydrocodone-acetaminophen, Irbesartan-hydrochlorothiazide, Lisinopril, Omeprazole, Propoxyphene n-acetaminophen, Valsartan, Warfarin sodium,  Diltiazem hcl, and Verapamil   ? ?Review of Systems   ?Review of Systems  ?Constitutional:  Negative for fever.  ?Respiratory:  Positive for chest tightness and shortness of breath.   ?Gastrointestinal:  Negative for diarrhea and vomiting.  ?Neurological:  Positive for weakness.  ? ?Physical Exam ?Updated Vital Signs ?BP 125/66   Pulse (!) 52   Temp 97.6 ?F (36.4 ?C)   Resp 13   Ht '5\' 6"'$  (1.676 m)   Wt 81.6 kg   SpO2 96%   BMI 29.05 kg/m?  ?Physical Exam ?Vitals and nursing note reviewed.  ?Constitutional:   ?   General: She is not in acute distress. ?   Appearance: She is well-developed. She is not ill-appearing or diaphoretic.  ?HENT:  ?   Head: Normocephalic and atraumatic.  ?Cardiovascular:  ?   Rate and Rhythm: Tachycardia present. Rhythm irregular.  ?   Heart sounds: Normal heart sounds.  ?Pulmonary:  ?   Effort: Pulmonary effort is normal.  ?   Breath sounds: Normal breath sounds. No wheezing, rhonchi or rales.  ?Abdominal:  ?   General: There is no distension.  ?Musculoskeletal:  ?   Right lower leg: No edema.  ?   Left lower leg: No edema.  ?Skin: ?   General: Skin is warm and dry.  ?Neurological:  ?   Mental Status: She is alert.  ? ? ?ED Results / Procedures / Treatments   ?Labs ?(all labs ordered are listed, but only abnormal results are displayed) ?Labs Reviewed  ?BASIC METABOLIC PANEL - Abnormal; Notable for the following components:  ?    Result Value  ? Glucose, Bld 132 (*)   ? Creatinine, Ser 1.15 (*)   ? Calcium 10.4 (*)   ? GFR, Estimated 46 (*)   ? All other components within normal limits  ?CBC - Abnormal; Notable for the following components:  ? RBC 5.60 (*)   ? Hemoglobin 17.5 (*)   ? HCT 50.5 (*)   ? All other components within normal limits  ?MAGNESIUM - Abnormal; Notable for the following components:  ? Magnesium 1.6 (*)   ? All other components within normal limits  ?TROPONIN I (HIGH SENSITIVITY)  ? ? ?EKG ?EKG Interpretation ? ?Date/Time:  Monday Dec 30 2021 08:01:41  EDT ?Ventricular Rate:  129 ?PR Interval:    ?QRS Duration: 82 ?QT Interval:  340 ?QTC Calculation: 498 ?R Axis:   10 ?Text Interpretation: Atrial fibrillation with rapid ventricular response Low voltage QRS Cannot rule out Anterior infarct , age undetermined afib has replaced sinus rhythm when compared to 2019 Confirmed by Sherwood Gambler [phone]) on 12/30/2021 8:19:57 AM ? ?Radiology ?DG Chest 2 View ? ?Result Date: 12/30/2021 ?CLINICAL DATA:  Chest tightness, heart racing, history atrial fibrillation EXAM: CHEST - 2 VIEW COMPARISON:  06/14/2018 FINDINGS: Borderline enlargement of cardiac silhouette. Mediastinal contours and pulmonary vascularity normal. Atherosclerotic calcification aorta. Chronic peribronchial thickening. No acute infiltrate, pleural effusion, or pneumothorax. Bones demineralized. IMPRESSION: Bronchitic changes without infiltrate. Aortic Atherosclerosis (ICD10-I70.0). Electronically Signed  By: Lavonia Dana M.D.   On: 12/30/2021 08:20   ? ?Procedures ?.Sedation ? ?Date/Time: 12/30/2021 12:30 PM ?Performed by: Sherwood Gambler, MD ?Authorized by: Sherwood Gambler, MD  ? ?Consent:  ?  Consent obtained:  Written ?  Consent given by:  Patient ?  Risks discussed:  Allergic reaction, dysrhythmia, nausea, vomiting, respiratory compromise necessitating ventilatory assistance and intubation, prolonged hypoxia resulting in organ damage and prolonged sedation necessitating reversal ?Universal protocol:  ?  Immediately prior to procedure, a time out was called: yes   ?  Patient identity confirmed:  Verbally with patient ?Indications:  ?  Procedure performed:  Cardioversion ?  Procedure necessitating sedation performed by:  Physician performing sedation ?Pre-sedation assessment:  ?  Time since last food or drink:  8+ hours ?  ASA classification: class 3 - patient with severe systemic disease   ?  Mallampati score:  II - soft palate, uvula, fauces visible ?  Neck mobility: normal   ?  Pre-sedation assessments  completed and reviewed: airway patency, cardiovascular function, hydration status, mental status, nausea/vomiting, pain level, respiratory function and temperature   ?Immediate pre-procedure details:  ?  Reassessment: Patient Parke Poisson

## 2022-01-08 ENCOUNTER — Other Ambulatory Visit: Payer: Self-pay | Admitting: Internal Medicine

## 2022-01-08 DIAGNOSIS — I10 Essential (primary) hypertension: Secondary | ICD-10-CM

## 2022-01-08 NOTE — Progress Notes (Signed)
?Cardiology Office Note:   ? ?Date:  01/09/2022  ? ?ID:  [name], DOB [birth year], MRN [id number] ? ?PCP:  Janith Lima, MD ?[place] Cardiologist: Shelva Majestic, MD  ? ?Reason for visit: Atrial fibrillation ? ?History of Present Illness:   ? ?[name] is a 88 y.o. female with a hx of paroxysmal atrial fibrillation, hypertension, hyperlipidemia. ? ?She presented in 2018 with palpitations, shortness of breath and presyncope.  She was in A-fib with RVR.  She was started on sotalol and underwent cardioversion.  She had A-fib again in 2018 and had another cardioversion. ? ?Dr. Claiborne Billings last saw her November 2022.  Patient had stress with her husband having stage IV lung cancer and dementia.  She was doing reasonably well and was to follow-up in 1 year. ? ?She went to the ED on Dec 30, 2021 with chest tightness, weakness and shortness of breath.  She felt herself going to A-fib on April 29.  She was successfully cardioverted with 200 J. ? ?Today, she states she feels well.  She states she has atrial fibrillation very rarely perhaps every 1 to 3 years.  She is very aware of her a-fib symptoms including palpitations, chest tightness, shortness of breath and lightheadedness.  She states she has passed out once with A-fib.  She states sometimes it will convert on its own but in most recent years she will require a cardioversion.  She was given IV magnesium with a mag of 1.6.  Her K was 3.5.  She tries to avoid caffeine.   ? ?Otherwise, on a normal day, she denies chest pain, PND, orthopnea, significant lower extremity edema and bleeding issues with Xarelto.  She states high co-pay and requests samples of Livalo and Xarelto. ? ? ?  ?Past Medical History:  ?Diagnosis Date  ? Abnormal nuclear stress test   ? mild anterolateral septal and inferior ischemia  ? Arthritis   ? Atherosclerosis of lower extremity with claudication ([place]) 04/17/2015  ? LEFT LEG  ? Atrial fibrillation ([place])   ? Claudication [facility])    ? Colon cancer ([place])   ? Coronary atherosclerosis of unspecified type of vessel, native or graft   ? Esophageal stricture   ? Fatty liver 11/05/11  ? Hiatal hernia   ? HLD (hyperlipidemia)   ? Hypertension   ? Iron deficiency anemia, unspecified   ? Ischemic colitis ([place])   ? Neuropathy   ? Osteoporosis   ? PAF (paroxysmal atrial fibrillation) ([place]) 04/2015  ? Unspecified vascular insufficiency of intestine   ? ? ?Past Surgical History:  ?Procedure Laterality Date  ? abdominoperineal resection anastomotic stricture  05/2007  ? APPENDECTOMY    ? CARDIAC CATHETERIZATION N/A 03/13/2015  ? Procedure: Left Heart Cath and Coronary Angiography;  Surgeon: Troy Sine, MD;  Location: [place] [facility];  Service: Cardiovascular;  Laterality: N/A;  ? CARDIOVERSION N/A 04/20/2015  ? Procedure: CARDIOVERSION;  Surgeon: Jerline Pain, MD;  Location: [place];  Service: Cardiovascular;  Laterality: N/A;  ? CARDIOVERSION N/A 07/10/2017  ? Procedure: CARDIOVERSION;  Surgeon: Jerline Pain, MD;  Location: [facility] ENDOSCOPY;  Service: Cardiovascular;  Laterality: N/A;  ? [place]  ? ESOPHAGOGASTRODUODENOSCOPY N/A 04/27/2014  ? Procedure: ESOPHAGOGASTRODUODENOSCOPY (EGD);  Surgeon: Lafayette Dragon, MD;  Location: [facility]OS[facility];  Service: Endoscopy;  Laterality: N/A;  ? Mandibular Renstruction    ? ROTATOR CUFF REPAIR  2006  ? left  ? Rt. Salpingo oophorectomy  and cyst removal  2005  ? SAVORY DILATION N/A 04/27/2014  ? Procedure: SAVORY DILATION;  Surgeon: Lafayette Dragon, MD;  Location: [facility] ENDOSCOPY;  Service: Endoscopy;  Laterality: N/A;  no xray needed  ? sigmoid resection for invasive rectal adenocarcinoma  12/2006  ? TEE WITHOUT CARDIOVERSION N/A 04/20/2015  ? Procedure: TRANSESOPHAGEAL ECHOCARDIOGRAM (TEE);  Surgeon: Jerline Pain, MD;  Location: [facility] ENDOSCOPY;  Service: Cardiovascular;  Laterality: N/A;  ? TUBAL LIGATION    ? WRIST SURGERY  2008  ? right  ? ? ?Current Medications: ?Current Meds  ?Medication Sig   ? acetaminophen (TYLENOL) 500 MG tablet Take 1,000 mg by mouth every 6 (six) hours as needed for headache (pain).  ? amitriptyline (ELAVIL) 10 MG tablet Take 1 tablet (10 mg total) by mouth at bedtime. (Patient taking differently: Take 10 mg by mouth at bedtime as needed for sleep (neuropathy).)  ? Calcium Carbonate-Vitamin D (CALCIUM-D PO) Take 1 tablet by mouth at bedtime. Calcium 630 mg, Vitamin D3 500 units  ? chlorthalidone (HYGROTON) 25 MG tablet Take 1 tablet (25 mg total) by mouth daily.  ? cyanocobalamin 2000 MCG tablet Take 1 tablet (2,000 mcg total) by mouth every other day. (Patient taking differently: Take 2,000 mcg by mouth daily.)  ? diclofenac sodium (VOLTAREN) 1 % GEL Apply 2 g topically 4 (four) times daily. (Patient taking differently: Apply 2 g topically 4 (four) times daily as needed (painful joints).)  ? Dietary Management Product (FOSTEUM PLUS) CAPS Take 1 capsule by mouth 2 (two) times daily.  ? famotidine (PEPCID) 40 MG tablet TAKE 1 TABLET (40 MG TOTAL) BY MOUTH AT BEDTIME. (Patient taking differently: Take 40 mg by mouth at bedtime as needed for heartburn.)  ? finasteride (PROPECIA) 1 MG tablet TAKE 1 TABLET AT BEDTIME (Patient taking differently: Take 1 mg by mouth at bedtime.)  ? metoprolol tartrate (LOPRESSOR) 25 MG tablet TAKE 1 AND 1/2 TABLETS IN THE MORNING  AND TAKE 1 TABLET AT BEDTIME (Patient taking differently: Take 25-37.5 mg by mouth See admin instructions. Take '25mg'$  by mouth in the morning and 37.'5mg'$  at bedtime.)  ? Pitavastatin Calcium (LIVALO) 2 MG TABS Take 1 tablet (2 mg total) by mouth daily. (Patient taking differently: Take 1 mg by mouth daily.)  ? potassium chloride (KLOR-CON) 10 MEQ tablet TAKE 2 TABLETS DAILY (DOSE INCREASED DUE TO HYPOKALEMIA) (Patient taking differently: Take 10 mEq by mouth 2 (two) times daily.)  ? rivaroxaban (XARELTO) 20 MG TABS tablet Take 1 tablet (20 mg total) by mouth daily with supper.  ? sotalol (BETAPACE) 120 MG tablet TAKE 1 TABLET  EVERY 12 HOURS (Patient taking differently: Take 120 mg by mouth 2 (two) times daily.)  ? traMADol (ULTRAM) 50 MG tablet TAKE 1 TABLET BY MOUTH EVERY 6 HOURS AS NEEDED (Patient taking differently: Take 50 mg by mouth every 6 (six) hours as needed for moderate pain.)  ?  ? ?Allergies:   Crestor [rosuvastatin calcium], Acetaminophen-codeine, Amlodipine besylate, Hydrocodone-acetaminophen, Irbesartan-hydrochlorothiazide, Lisinopril, Omeprazole, Propoxyphene n-acetaminophen, Valsartan, Warfarin sodium, Diltiazem hcl, and Verapamil  ? ?Social History  ? ?Socioeconomic History  ? Marital status: Widowed  ?  Spouse name: Not on file  ? Number of children: 4  ? Years of education: 12th grade  ? Highest education level: Not on file  ?Occupational History  ? Occupation: retired  ?  Employer: RETIRED  ?Tobacco Use  ? Smoking status: Never  ? Smokeless tobacco: Never  ?Vaping Use  ? Vaping Use: Never used  ?  Substance and Sexual Activity  ? Alcohol use: No  ? Drug use: No  ? Sexual activity: Not Currently  ?Other Topics Concern  ? Not on file  ?Social History Narrative  ? Lives at home with fiance.  ? Right-handed.  ? No caffeine use.  ? ?Social Determinants of Health  ? ?Financial Resource Strain: Low Risk   ? Difficulty of Paying Living Expenses: Not hard at all  ?Food Insecurity: No Food Insecurity  ? Worried About Charity fundraiser in the Last Year: Never true  ? Ran Out of Food in the Last Year: Never true  ?Transportation Needs: No Transportation Needs  ? Lack of Transportation (Medical): No  ? Lack of Transportation (Non-Medical): No  ?Physical Activity: Inactive  ? Days of Exercise per Week: 0 days  ? Minutes of Exercise per Session: 0 min  ?Stress: No Stress Concern Present  ? Feeling of Stress : Not at all  ?Social Connections: Moderately Integrated  ? Frequency of Communication with Friends and Family: More than three times a week  ? Frequency of Social Gatherings with Friends and Family: More than three times a  week  ? Attends Religious Services: More than 4 times per year  ? Active Member of Clubs or Organizations: Yes  ? Attends Archivist Meetings: More than 4 times per year  ? Marital Status: Widowed

## 2022-01-09 ENCOUNTER — Encounter: Payer: Self-pay | Admitting: Physician Assistant

## 2022-01-09 ENCOUNTER — Ambulatory Visit: Payer: Medicare HMO | Admitting: Physician Assistant

## 2022-01-09 VITALS — BP 132/70 | HR 61 | Resp 20 | Ht 66.0 in | Wt 186.2 lb

## 2022-01-09 DIAGNOSIS — Z7901 Long term (current) use of anticoagulants: Secondary | ICD-10-CM | POA: Diagnosis not present

## 2022-01-09 DIAGNOSIS — I10 Essential (primary) hypertension: Secondary | ICD-10-CM

## 2022-01-09 DIAGNOSIS — E785 Hyperlipidemia, unspecified: Secondary | ICD-10-CM | POA: Diagnosis not present

## 2022-01-09 DIAGNOSIS — K5090 Crohn's disease, unspecified, without complications: Secondary | ICD-10-CM | POA: Diagnosis not present

## 2022-01-09 DIAGNOSIS — I480 Paroxysmal atrial fibrillation: Secondary | ICD-10-CM

## 2022-01-09 NOTE — Patient Instructions (Signed)
Medication Instructions:  ?Your physician recommends that you continue on your current medications as directed. Please refer to the Current Medication list given to you today.  ? ?*If you need a refill on your cardiac medications before your next appointment, please call your pharmacy* ? ? ?Lab Work: ?NONE ordered at this time of appointment  ? ?If you have labs (blood work) drawn today and your tests are completely normal, you will receive your results only by: ?MyChart Message (if you have MyChart) OR ?A paper copy in the mail ?If you have any lab test that is abnormal or we need to change your treatment, we will call you to review the results. ? ? ?Testing/Procedures: ?NONE ordered at this time of appointment  ? ? ? ?Follow-Up: ?At [facility], you and your health needs are our priority.  As part of our continuing mission to provide you with exceptional heart care, we have created designated Provider Care Teams.  These Care Teams include your primary Cardiologist (physician) and Advanced Practice Providers (APPs -  Physician Assistants and Nurse Practitioners) who all work together to provide you with the care you need, when you need it. ? ?We recommend signing up for the patient portal called "MyChart".  Sign up information is provided on this After Visit Summary.  MyChart is used to connect with patients for Virtual Visits (Telemedicine).  Patients are able to view lab/test results, encounter notes, upcoming appointments, etc.  Non-urgent messages can be sent to your provider as well.   ?To learn more about what you can do with MyChart, go to [email or web address].   ? ?Your next appointment:   ?  ?Keep Appointment  ? ?The format for your next appointment:   ?In Person ? ?Provider:   ?Shelva Majestic, MD   ? ? ?Other Instructions ?Potassium Content of Foods ? ?Potassium is a mineral found in many foods and drinks. It can affect how the heart works, affect blood pressure, and keep fluids and electrolytes  balanced in the body. It is important not to have too much potassium (hyperkalemia) or too little potassium (hypokalemia) in the body, especially in the blood. ?Potassium is naturally found in many different types of whole foods, such as fruits, vegetables, meat, and dairy products. Processed foods tend to be lower in potassium. The amount of potassium you need each day depends on your age and any medical conditions you may have. General recommendations are: ?Females aged 38 and older: 2,600 mg per day. ?Males aged 52 and older: 3,400 mg per day. ?Talk with your health care provider or dietitian about how much potassium you need. ?What foods are high in potassium? ?Below are examples of foods that have greater than 200 mg of potassium per serving. ?Fruits ?Orange -- 1 medium (130 g) has 230 mg of potassium. ?Banana -- 1 medium (120 g) has 420 mg of potassium. ?Cantaloupe, chunks -- 1 cup (160 g) has 430 mg of potassium. ?Vegetables ?Potato, baked, without skin -- 1 medium (170 g) has 600 mg of potassium. ?Broccoli, chopped, cooked -- ? cup (77.5 g) has 230 mg of potassium. ?Tomato, chopped or sliced -- 1 cup (132 g) has 400 mg of potassium. ?Grains ?Cereal, bran with raisins -- 1 cup (59 g) has 360 mg of potassium. ?Granola with almonds -- ? cup (82 g) has 220 mg of potassium. ?Meats and other proteins ?Ground beef patty -- 4 ounces (113 g) has 240 mg of potassium. ?Kidney beans, boiled -- ? cup (130 g) has  350 mg of potassium. ?Almonds -- 1 ounce (approximately 22 nuts or 28 g) has 200 mg of potassium. ?Dairy ?Cow's milk, 1% -- 1 cup (237 mL) has 360 mg of potassium. ?Plain vanilla low-fat yogurt -- ? cup (184 g) has 220 mg of potassium. ?The items listed above may not be a complete list of foods high in potassium. Actual amounts of potassium may be different depending on ripeness, shelf life, and food preparation. Contact a dietitian for more information. ?What foods are low in potassium? ?Below are examples of  foods that have less than 200 mg of potassium per serving. ?Fruits ?Blueberries -- 1 cup (145 g) has 110 mg of potassium. ?Apple -- 1 medium (140 g) has 145 mg of potassium. ?Grapes -- 1 cup (160 g) has 175 mg of potassium. ?Vegetables ?Cabbage, raw -- 1 cup (70 g) has 120 mg of potassium. ?Cauliflower, chopped, cooked -- 1 cup (180 g) has 90 mg of potassium. ?Romaine lettuce, chopped -- 1 cup (56 g) has 120 mg of potassium. ?Grains ?Bagel, plain -- one 4-inch (10 cm) has 100 mg of potassium. ?Whole wheat bread -- 1 slice (26 g) has 70 mg of potassium. ?White rice, cooked -- 1 cup (163 g) has 50 mg of potassium. ?Meats and other proteins ?Tuna, light, canned in water -- 3 ounces (85 g) has 150 mg of potassium. ?Egg, fried -- 1 large (50 g) has 60 mg of potassium. ?Peanuts --1 ounce (35 nuts or 28 g) has 180 mg of potassium. ?Tofu -- ? cup (252 g) has 150 mg of potassium. ?Dairy ?Cheese (cheddar, colby, mozzarella, or provolone) -- 1 ounce (28 g) has 30 to 40 mg of potassium. ?The items listed above may not be a complete list of foods that are low in potassium. Actual amounts of potassium may be different depending on ripeness, shelf life, and food preparation. Contact a dietitian for more information. ?Summary ?Potassium is a mineral found in many foods and drinks. It affects how the heart works, affects blood pressure, and keeps fluids and electrolytes balanced in the body. ?The amount of potassium you need each day depends on your age and any existing medical conditions you may have. ?Your health care provider or dietitian may recommend an amount of potassium that you should have each day. ?This information is not intended to replace advice given to you by your health care provider. Make sure you discuss any questions you have with your health care provider. ?Document Revised: 05/21/2021 Document Reviewed: 05/02/2021 ?Elsevier Patient Education ? [place]. ? ? ?Important Information About  Sugar ? ? ? ? ? ? ?

## 2022-01-27 ENCOUNTER — Other Ambulatory Visit: Payer: Self-pay | Admitting: Cardiovascular Disease

## 2022-03-14 ENCOUNTER — Encounter: Payer: Self-pay | Admitting: Nurse Practitioner

## 2022-04-07 ENCOUNTER — Telehealth: Payer: Self-pay | Admitting: Nurse Practitioner

## 2022-04-07 NOTE — Telephone Encounter (Signed)
Inbound call from patient stating that she needed to reschedule her appointment that is on 8/11 at 11:30. I advised her that the next appointment was 9/6. Patient stated that she did not want to let his appointment go but her bother in law  passed away and his funeral is on Friday at the same time and that she would just miss his funeral. I advised patient that I would send a message to see if we could work something out for her. Please advise.

## 2022-04-08 NOTE — Telephone Encounter (Signed)
Pt stated that she will not be  able to make appointment for 04/11/2022 due to her brother n law funeral being on that date: Pt was rescheduled to 04/09/2022 at 11:30 with Vicie Mutters PA Pt made aware Pt verbalized understanding with all questions answered.

## 2022-04-08 NOTE — Telephone Encounter (Signed)
Correction to note below: Pt stated that she will not be  able to make appointment for 04/11/2022 due to her brother n law funeral being on that date: Pt was rescheduled to 04/09/2022 at 10:30 with Vicie Mutters PA Pt made aware Pt verbalized understanding with all questions answered.

## 2022-04-09 ENCOUNTER — Ambulatory Visit (INDEPENDENT_AMBULATORY_CARE_PROVIDER_SITE_OTHER)
Admission: RE | Admit: 2022-04-09 | Discharge: 2022-04-09 | Disposition: A | Discharge status: Home / Self Care | Payer: Medicare HMO | Source: Ambulatory Visit | Attending: Physician Assistant | Admitting: Physician Assistant

## 2022-04-09 ENCOUNTER — Encounter: Payer: Self-pay | Admitting: Physician Assistant

## 2022-04-09 ENCOUNTER — Other Ambulatory Visit (INDEPENDENT_AMBULATORY_CARE_PROVIDER_SITE_OTHER): Payer: Medicare HMO

## 2022-04-09 ENCOUNTER — Ambulatory Visit: Payer: Medicare HMO | Admitting: Physician Assistant

## 2022-04-09 VITALS — BP 165/90 | HR 55 | Ht 66.0 in | Wt 184.0 lb

## 2022-04-09 DIAGNOSIS — K582 Mixed irritable bowel syndrome: Secondary | ICD-10-CM

## 2022-04-09 DIAGNOSIS — C20 Malignant neoplasm of rectum: Secondary | ICD-10-CM | POA: Diagnosis not present

## 2022-04-09 DIAGNOSIS — K222 Esophageal obstruction: Secondary | ICD-10-CM

## 2022-04-09 DIAGNOSIS — K5900 Constipation, unspecified: Secondary | ICD-10-CM | POA: Diagnosis not present

## 2022-04-09 DIAGNOSIS — K219 Gastro-esophageal reflux disease without esophagitis: Secondary | ICD-10-CM | POA: Diagnosis not present

## 2022-04-09 LAB — COMPREHENSIVE METABOLIC PANEL
ALT: 14 U/L (ref 0–35)
AST: 19 U/L (ref 0–37)
Albumin: 4.4 g/dL (ref 3.5–5.2)
Alkaline Phosphatase: 50 U/L (ref 39–117)
BUN: 22 mg/dL (ref 6–23)
CO2: 32 mEq/L (ref 19–32)
Calcium: 10.1 mg/dL (ref 8.4–10.5)
Chloride: 101 mEq/L (ref 96–112)
Creatinine, Ser: 0.89 mg/dL (ref 0.40–1.20)
GFR: 58.33 mL/min — ABNORMAL LOW (ref >60.00)
Glucose, Bld: 102 mg/dL — ABNORMAL HIGH (ref 70–99)
Potassium: 3.8 mEq/L (ref 3.5–5.1)
Sodium: 140 mEq/L (ref 135–145)
Total Bilirubin: 1.1 mg/dL (ref 0.2–1.2)
Total Protein: 7.5 g/dL (ref 6.0–8.3)

## 2022-04-09 LAB — CBC WITH DIFFERENTIAL/PLATELET
Basophils Absolute: 0 10*3/uL (ref 0.0–0.1)
Basophils Relative: 0.7 % (ref 0.0–3.0)
Eosinophils Absolute: 0.2 10*3/uL (ref 0.0–0.7)
Eosinophils Relative: 3.5 % (ref 0.0–5.0)
HCT: 44.1 % (ref 36.0–46.0)
Hemoglobin: 14.8 g/dL (ref 12.0–15.0)
Lymphocytes Relative: 32.4 % (ref 12.0–46.0)
Lymphs Abs: 1.9 10*3/uL (ref 0.7–4.0)
MCHC: 33.6 g/dL (ref 30.0–36.0)
MCV: 91.5 fl (ref 78.0–100.0)
Monocytes Absolute: 0.6 10*3/uL (ref 0.1–1.0)
Monocytes Relative: 10.3 % (ref 3.0–12.0)
Neutro Abs: 3 10*3/uL (ref 1.4–7.7)
Neutrophils Relative %: 53.1 % (ref 43.0–77.0)
Platelets: 179 10*3/uL (ref 150.0–400.0)
RBC: 4.82 Mil/uL (ref 3.87–5.11)
RDW: 13.7 % (ref 11.5–15.5)
WBC: 5.7 10*3/uL (ref 4.0–10.5)

## 2022-04-09 LAB — SEDIMENTATION RATE: Sed Rate: 36 mm/hr — ABNORMAL HIGH (ref 0–30)

## 2022-04-09 MED ORDER — METRONIDAZOLE 250 MG PO TABS: 250.0000 mg | ORAL_TABLET | Freq: Three times a day (TID) | ORAL | 0 refills | Status: AC

## 2022-04-09 MED ORDER — DIPHENOXYLATE-ATROPINE 2.5-0.025 MG PO TABS: 1.0000 | ORAL_TABLET | Freq: Four times a day (QID) | ORAL | 0 refills | Status: DC | PRN

## 2022-04-09 NOTE — Patient Instructions (Addendum)
Your provider has requested that you go to the basement level for lab work before leaving today. Press "B" on the elevator. The lab is located at the first door on the left as you exit the elevator.  Your provider has requested that you have an abdominal x ray before leaving today. Please go to the basement floor to our Radiology department for the test.    Miralax is an osmotic laxative.  It only brings more water into the stool.  This is safe to take daily.  Can take up to 17 gram of miralax twice a day.  Mix with juice or coffee.  Start 1 capful at night for 3-4 days and reassess your response in 3-4 days.  You can increase and decrease the dose based on your response.  Remember, it can take up to 3-4 days to take effect OR for the effects to wear off.   Toileting tips to help with your constipation - Drink at least 64-80 ounces of water/liquid per day. - Establish a time to try to move your bowels every day.  For many people, this is after a cup of coffee or after a meal such as breakfast. - Sit all of the way back on the toilet keeping your back fairly straight and while sitting up, try to rest the tops of your forearms on your upper thighs.   - Raising your feet with a step stool/squatty potty can be helpful to improve the angle that allows your stool to pass through the rectum. - Relax the rectum feeling it bulge toward the toilet water.  If you feel your rectum raising toward your body, you are contracting rather than relaxing. - Breathe in and slowly exhale. "Belly breath" by expanding your belly towards your belly button. Keep belly expanded as you gently direct pressure down and back to the anus.  A low pitched GRRR sound can assist with increasing intra-abdominal pressure.  - Repeat 3-4 times. If unsuccessful, contract the pelvic floor to restore normal tone and get off the toilet.  Avoid excessive straining. - To reduce excessive wiping by teaching your anus to normally contract,  place hands on outer aspect of knees and resist knee movement outward.  Hold 5-10 second then place hands just inside of knees and resist inward movement of knees.  Hold 5 seconds.  Repeat a few times each way.  Please try low FODMAP diet- see below- start with just one column at a time.  Increase activity Can do trial of IBGard for AB pain- Take 1-2 capsules once a day for maintence or twice a day during a flare if any worsening symptoms like blood in stool, weight loss, please call the office or go to the ER.     FODMAP stands for fermentable oligo-, di-, mono-saccharides and polyols (1). These are the scientific terms used to classify groups of carbs that are notorious for triggering digestive symptoms like bloating, gas and stomach pain.     Due to recent changes in healthcare laws, you may see the results of your imaging and laboratory studies on MyChart before your provider has had a chance to review them.  We understand that in some cases there may be results that are confusing or concerning to you. Not all laboratory results come back in the same time frame and the provider may be waiting for multiple results in order to interpret others.  Please give [place] 48 hours in order for your provider to thoroughly review all the  results before contacting the office for clarification of your results.    _______________________________________________________  If you are age 60 or older, your body mass index should be between 23-30. Your Body mass index is 29.7 kg/m. If this is out of the aforementioned range listed, please consider follow up with your Primary Care Provider.  If you are age 66 or younger, your body mass index should be between 19-25. Your Body mass index is 29.7 kg/m. If this is out of the aformentioned range listed, please consider follow up with your Primary Care Provider.   ________________________________________________________  The [facility] providers would like to  encourage you to use [facility] to communicate with providers for non-urgent requests or questions.  Due to long hold times on the telephone, sending your provider a message by [facility] may be a faster and more efficient way to get a response.  Please allow 48 business hours for a response.  Please remember that this is for non-urgent requests.  _______________________________________________________   I appreciate the  opportunity to care for you  Thank You   [facility]

## 2022-04-09 NOTE — Progress Notes (Signed)
04/09/2022 [name] [id number] 1932/09/17  Referring provider: Janith Lima, MD Primary GI doctor: Dr. Silverio Decamp  ASSESSMENT AND PLAN:   Irritable bowel syndrome with both constipation and diarrhea History of rectal cancer s/p sigmoid resection Colonoscopy October 2012 showed minimal narrowing at sigmoid anastomosis at 20 cm.  No recall due to age. -     metroNIDAZOLE (FLAGYL) 250 MG tablet; Take 1 tablet (250 mg total) by mouth 3 (three) times daily for 14 days. -     DG Abd 1 View; Future -     CBC with Differential/Platelet; Future -     Comprehensive metabolic panel; Future -     Sedimentation rate; Future -     diphenoxylate-atropine (LOMOTIL) 2.5-0.025 MG tablet; Take 1 tablet by mouth 4 (four) times daily as needed for diarrhea or loose stools. No alarm symptoms.  Will get KUB to evaluate for obstruction Patient had good results with flagyl in the past for possible SIBO, will treat. Suggest taking more regular bowel regiment. Try mirlax daily, educated about how it works and titration.  Lomitil as needed, continue IBGard  Gastroesophageal reflux disease without esophagitis with history of stricture/hiatal hernia she reports symptoms are currently well controlled. Lifestyle changes discussed, avoid NSAIDS Continue current medications but consider taking pepcid daily Chew well, sit up after eating, call if any worsening symptoms, consider barium swallow  Patient Care Team: Janith Lima, MD as PCP - General (Internal Medicine) Troy Sine, MD as PCP - Cardiology (Cardiology)  HISTORY OF PRESENT ILLNESS: 88 y.o. female with a past medical history of chronic GERD, hernia, esophageal stricture status post dilation, sigmoid cancer 2008 status post sigmoid resection and anastomotic stricture and others listed below presents for evaluation of IBS.   2019 seen Dr. Silverio Decamp in the office.  She states she eats what she wants but milk and can control her "flare  ups" but since June she has not been able to control it.  She has had intermittent episodes of constipation/diarrhea, with days of having to go up 5 times a day and then days without a BM.  She will take the lomitil if she is going somewhere and helps with pain.  Can have some AB cramping with diarrhea, better after BM's.   No weight loss.  No fever, chills.  Denies nausea, vomiting.  She has some AB bloating, states sometimes she passes gas, sometimes she does not.  No blood in the stool, no melena.  She use to have a "round of ABX" in the past that helped considerably.  She is on miralax as needed and colace as needed, will take with constipation. Not on fiber. On peppermint oil.   She has continuing dysphagia, not any worse, has some GERD with certain foods, takes pepcid as needed.   Colonoscopy October 2012 showed minimal narrowing at sigmoid anastomosis at 20 cm.  No recall due to age. EGD July 19, 2014 distal esophageal stricture dilated from 14 to 18 mm prior to that EGD April 2014  Current Medications:    Current Outpatient Medications (Cardiovascular):    chlorthalidone (HYGROTON) 25 MG tablet, Take 1 tablet (25 mg total) by mouth daily. (Patient not taking: Reported on 04/09/2022)   metoprolol tartrate (LOPRESSOR) 25 MG tablet, TAKE 1 AND 1/2 TABLETS IN THE MORNING  AND TAKE 1 TABLET AT BEDTIME (Patient not taking: Reported on 04/09/2022)   Pitavastatin Calcium (LIVALO) 2 MG TABS, Take 1 tablet (2 mg total) by mouth daily. (  Patient not taking: Reported on 04/09/2022)   sotalol (BETAPACE) 120 MG tablet, TAKE 1 TABLET EVERY 12 HOURS (Patient not taking: Reported on 04/09/2022)   Current Outpatient Medications (Analgesics):    acetaminophen (TYLENOL) 500 MG tablet, Take 1,000 mg by mouth every 6 (six) hours as needed for headache (pain). (Patient not taking: Reported on 04/09/2022)   traMADol (ULTRAM) 50 MG tablet, TAKE 1 TABLET BY MOUTH EVERY 6 HOURS AS NEEDED (Patient not taking:  Reported on 04/09/2022)  Current Outpatient Medications (Hematological):    cyanocobalamin 2000 MCG tablet, Take 1 tablet (2,000 mcg total) by mouth every other day. (Patient not taking: Reported on 04/09/2022)   rivaroxaban (XARELTO) 20 MG TABS tablet, Take 1 tablet (20 mg total) by mouth daily with supper. (Patient not taking: Reported on 04/09/2022)  Current Outpatient Medications (Other):    diphenoxylate-atropine (LOMOTIL) 2.5-0.025 MG tablet, Take 1 tablet by mouth 4 (four) times daily as needed for diarrhea or loose stools.   metroNIDAZOLE (FLAGYL) 250 MG tablet, Take 1 tablet (250 mg total) by mouth 3 (three) times daily for 14 days.   amitriptyline (ELAVIL) 10 MG tablet, Take 1 tablet (10 mg total) by mouth at bedtime. (Patient not taking: Reported on 04/09/2022)   Calcium Carbonate-Vitamin D (CALCIUM-D PO), Take 1 tablet by mouth at bedtime. Calcium 630 mg, Vitamin D3 500 units (Patient not taking: Reported on 04/09/2022)   diclofenac sodium (VOLTAREN) 1 % GEL, Apply 2 g topically 4 (four) times daily. (Patient not taking: Reported on 04/09/2022)   Dietary Management Product (FOSTEUM PLUS) CAPS, Take 1 capsule by mouth 2 (two) times daily. (Patient not taking: Reported on 04/09/2022)   famotidine (PEPCID) 40 MG tablet, TAKE 1 TABLET (40 MG TOTAL) BY MOUTH AT BEDTIME. (Patient not taking: Reported on 04/09/2022)   finasteride (PROPECIA) 1 MG tablet, TAKE 1 TABLET AT BEDTIME (Patient not taking: Reported on 04/09/2022)   potassium chloride (KLOR-CON M) 10 MEQ tablet, TAKE 2 TABLETS DAILY (DOSE INCREASED DUE TO HYPOKALEMIA) (Patient not taking: Reported on 04/09/2022)  Medical History:  Past Medical History:  Diagnosis Date   Abnormal nuclear stress test    mild anterolateral septal and inferior ischemia   Arthritis    Atherosclerosis of lower extremity with claudication ([place]) 04/17/2015   LEFT LEG   Atrial fibrillation (HCC)    Cardiac arrhythmia due to congenital heart disease    Claudication [facility])     Colon cancer ([place])    Coronary atherosclerosis of unspecified type of vessel, native or graft    Esophageal stricture    Fatty liver 11/05/2011   Hiatal hernia    HLD (hyperlipidemia)    Hypertension    IBS (irritable bowel syndrome)    Iron deficiency anemia, unspecified    Ischemic colitis (HCC)    Neuropathy    Osteoporosis    PAF (paroxysmal atrial fibrillation) ([place]) 04/2015   Ulcerative colitis ([place])    Unspecified vascular insufficiency of intestine    Allergies:  Allergies  Allergen Reactions   Crestor [Rosuvastatin Calcium] Other (See Comments)    Muscle aches   Acetaminophen-Codeine Other (See Comments)    Unknown reaction - possibly nausea   Amlodipine Besylate Swelling    Hands and feet swelling   Hydrocodone-Acetaminophen Nausea And Vomiting   Irbesartan-Hydrochlorothiazide Other (See Comments)    REACTION: Dizziness   Lisinopril Cough   Omeprazole Nausea Only and Other (See Comments)    Dizziness, joint pain, weakness in legs, cramps in legs, stomach burned   Propoxyphene  N-Acetaminophen Other (See Comments)    Headache, and blotchy face   Valsartan Other (See Comments)    REACTION: blurred vision   Warfarin Sodium Other (See Comments)    REACTION: body aches and bleeding   Diltiazem Hcl Rash   Verapamil Nausea Only, Palpitations and Other (See Comments)    Headaches      Surgical History:  She  has a past surgical history that includes Tubal ligation; Appendectomy; sigmoid resection for invasive rectal adenocarcinoma (12/31/2006); abdominoperineal resection anastomotic stricture (05/03/2007); Dilation and curettage of uterus (09/01/1990); Rotator cuff repair (09/01/2004); Mandibular Renstruction; Rt. Salpingo oophorectomy and cyst removal (09/02/2003); Wrist surgery (09/01/2006); Esophagogastroduodenoscopy (N/A, 04/27/2014); Savory dilation (N/A, 04/27/2014); Cardiac catheterization (N/A, 03/13/2015); TEE without cardioversion (N/A, 04/20/2015);  Cardioversion (N/A, 04/20/2015); Cardioversion (N/A, 07/10/2017); and Colon surgery. Family History:  Her family history includes Alzheimer's disease in her mother; Colon cancer (age of onset: 4) in her father; Diabetes in her son and son; Heart disease in her father; Hypertension in her father, sister, son, and son; Mitral valve prolapse in her sister; Thyroid disease in her mother and sister. Social History:   reports that she has never smoked. She has never used smokeless tobacco. She reports that she does not drink alcohol and does not use drugs.  REVIEW OF SYSTEMS  : All other systems reviewed and negative except where noted in the History of Present Illness.   PHYSICAL EXAM: BP (!) 165/90   Pulse (!) 55   Ht '5\' 6"'$  (1.676 m)   Wt 184 lb (83.5 kg)   BMI 29.70 kg/m  General:   Pleasant, well developed female in no acute distress Head:   Normocephalic and atraumatic. Eyes:  sclerae anicteric,conjunctive pink  Heart:   regular rate and rhythm Pulm:  Clear anteriorly; no wheezing Abdomen:   Soft, Non-distended AB, Active bowel sounds. mild tenderness in the LLQ. Without guarding and Without rebound, No organomegaly appreciated. Rectal: Not evaluated Extremities:  Without edema. Msk: Symmetrical without gross deformities. Peripheral pulses intact.  Neurologic:  Alert and  oriented x4;  No focal deficits.  Skin:   Dry and intact without significant lesions or rashes. Psychiatric:  Cooperative. Normal mood and affect.    Vladimir Crofts, PA-C 11:07 AM

## 2022-04-11 ENCOUNTER — Ambulatory Visit: Payer: Medicare HMO | Admitting: Nurse Practitioner

## 2022-05-02 ENCOUNTER — Emergency Department (HOSPITAL_COMMUNITY)
Admission: EM | Admit: 2022-05-02 | Discharge: 2022-05-02 | Disposition: A | Discharge status: Home / Self Care | Payer: Medicare HMO | Attending: Emergency Medicine | Admitting: Emergency Medicine

## 2022-05-02 ENCOUNTER — Emergency Department (HOSPITAL_COMMUNITY): Payer: Medicare HMO

## 2022-05-02 ENCOUNTER — Telehealth: Payer: Self-pay | Admitting: Cardiovascular Disease

## 2022-05-02 ENCOUNTER — Encounter (HOSPITAL_COMMUNITY): Payer: Self-pay

## 2022-05-02 ENCOUNTER — Other Ambulatory Visit: Payer: Self-pay

## 2022-05-02 DIAGNOSIS — R739 Hyperglycemia, unspecified: Secondary | ICD-10-CM | POA: Insufficient documentation

## 2022-05-02 DIAGNOSIS — R000 Tachycardia, unspecified: Secondary | ICD-10-CM | POA: Diagnosis not present

## 2022-05-02 DIAGNOSIS — R0789 Other chest pain: Secondary | ICD-10-CM | POA: Diagnosis not present

## 2022-05-02 DIAGNOSIS — Z79899 Other long term (current) drug therapy: Secondary | ICD-10-CM | POA: Diagnosis not present

## 2022-05-02 DIAGNOSIS — I10 Essential (primary) hypertension: Secondary | ICD-10-CM | POA: Insufficient documentation

## 2022-05-02 DIAGNOSIS — R079 Chest pain, unspecified: Secondary | ICD-10-CM | POA: Diagnosis not present

## 2022-05-02 DIAGNOSIS — R450 Nervousness: Secondary | ICD-10-CM | POA: Diagnosis not present

## 2022-05-02 DIAGNOSIS — R0602 Shortness of breath: Secondary | ICD-10-CM | POA: Diagnosis not present

## 2022-05-02 LAB — TROPONIN I (HIGH SENSITIVITY)
Troponin I (High Sensitivity): 5 ng/L (ref <18)
Troponin I (High Sensitivity): 5 ng/L (ref <18)

## 2022-05-02 LAB — BASIC METABOLIC PANEL
Anion gap: 9 (ref 5–15)
BUN: 18 mg/dL (ref 8–23)
CO2: 28 mmol/L (ref 22–32)
Calcium: 9.8 mg/dL (ref 8.9–10.3)
Chloride: 104 mmol/L (ref 98–111)
Creatinine, Ser: 0.93 mg/dL (ref 0.44–1.00)
GFR, Estimated: 59 mL/min — ABNORMAL LOW (ref >60)
Glucose, Bld: 109 mg/dL — ABNORMAL HIGH (ref 70–99)
Potassium: 3.5 mmol/L (ref 3.5–5.1)
Sodium: 141 mmol/L (ref 135–145)

## 2022-05-02 LAB — CBC
HCT: 44.3 % (ref 36.0–46.0)
Hemoglobin: 14.9 g/dL (ref 12.0–15.0)
MCH: 31.3 pg (ref 26.0–34.0)
MCHC: 33.6 g/dL (ref 30.0–36.0)
MCV: 93.1 fL (ref 80.0–100.0)
Platelets: 182 10*3/uL (ref 150–400)
RBC: 4.76 MIL/uL (ref 3.87–5.11)
RDW: 13.3 % (ref 11.5–15.5)
WBC: 4.9 10*3/uL (ref 4.0–10.5)
nRBC: 0 % (ref 0.0–0.2)

## 2022-05-02 MED ORDER — FAMOTIDINE IN NACL 20-0.9 MG/50ML-% IV SOLN
20.0000 mg | Freq: Once | INTRAVENOUS | Status: AC
  Administered 2022-05-02: 20 mg via INTRAVENOUS
  Filled 2022-05-02: qty 50

## 2022-05-02 MED ORDER — ALUM & MAG HYDROXIDE-SIMETH 200-200-20 MG/5ML PO SUSP
30.0000 mL | Freq: Once | ORAL | Status: AC
  Administered 2022-05-02: 30 mL via ORAL
  Filled 2022-05-02: qty 30

## 2022-05-02 NOTE — ED Triage Notes (Signed)
Pt bib ems from home; doing yard work yesterday, starting having substernal chest pressure, sob; lasted all night, unable to sleep d/t sob, denies orthopnea, no provocation to chest tightness; sob with with exertion; denied n/v, denied recent cough; no swelling noted; 2 months ago, cardioverted for afib, endorses this feels different; BP 120/70, HR 50-56, sinus brady; 12 lead shows possible prolonged qt, 20ga LAC, 324 asa, 2 nitro given pta; run of trigeminy lasting about about 30 seconds after 2nd nitro, pain 5/10 currently; endorses recent increase in BP, took additional metoprolol this am per MD advice; pt is on xarelto

## 2022-05-02 NOTE — Discharge Instructions (Signed)
It was a pleasure taking care of you today!   Your workup was negative in the ED. You may follow-up with your cardiologist regarding today's ED visit.  Follow-up with your primary care provider for evaluation of your symptoms. You may return to the ED if you are experiencing increasing/worsening chest pain, shortness of breath, or worsening symptoms.

## 2022-05-02 NOTE — Telephone Encounter (Signed)
Pt c/o Shortness Of Breath: STAT if SOB developed within the last 24 hours or pt is noticeably SOB on the phone  1. Are you currently SOB (can you hear that pt is SOB on the phone)? No  2. How long have you been experiencing SOB?  Last night  3. Are you SOB when sitting or when up moving around? Sitting  4. Are you currently experiencing any other symptoms? BP has been running high (179/93) and tightness in there chest

## 2022-05-02 NOTE — ED Notes (Signed)
Pt reports feeling much better at time of d/c. Pt provided written and verbal d/c instructions. PT verbalizes understanding. PT left with family vias ambulation.

## 2022-05-02 NOTE — ED Provider Notes (Signed)
[facility] EMERGENCY DEPARTMENT Provider Note   CSN: [id number] Arrival date & time: 05/02/22  1037     History  No chief complaint on file.   [name] is a 88 y.o. female with a past medical history of paroxysmal A-fib, hypertension who presents to the emergency department with concerns for chest tightness onset yesterday.  Notes she was doing some yard work when she began to experience chest tightness and shortness of breath.  No meds tried palpation.  Patient was given nitroglycerin by EMS x2.  After first nitroglycerin patient notes relief of her chest tightness.  However noted after the second 1 her chest tightness came back.  Denies past medical history of MI, stents.  Has had a cardiac catheterization in the past.  Patient notes that her symptoms feel different from when she was evaluated in the emergency department recently for proximal A-fib and cardioverted.  Patient notes that she took an additional dose of her metoprolol due to feeling like her heart was going in and out of rhythm (total 50 mg).  Denies fever, cough, chest pain, nausea, vomiting, abdominal pain.   Per patient chart review: Patient was evaluated in the emergency department for proximal A-fib and at that time cardioverted.  She was followed up in the office with cardiology.  The history is provided by the patient. No language interpreter was used.       Home Medications Prior to Admission medications   Medication Sig Start Date End Date Taking? Authorizing Provider  acetaminophen (TYLENOL) 500 MG tablet Take 1,000 mg by mouth every 6 (six) hours as needed for headache (pain). Patient not taking: Reported on 04/09/2022    [provider]  amitriptyline (ELAVIL) 10 MG tablet Take 1 tablet (10 mg total) by mouth at bedtime. Patient not taking: Reported on 04/09/2022 11/13/21   Janith Lima, MD  Calcium Carbonate-Vitamin D (CALCIUM-D PO) Take 1 tablet by mouth at bedtime. Calcium  630 mg, Vitamin D3 500 units Patient not taking: Reported on 04/09/2022    [provider]  chlorthalidone (HYGROTON) 25 MG tablet Take 1 tablet (25 mg total) by mouth daily. Patient not taking: Reported on 04/09/2022 01/08/22   Janith Lima, MD  cyanocobalamin 2000 MCG tablet Take 1 tablet (2,000 mcg total) by mouth every other day. Patient not taking: Reported on 04/09/2022 01/27/20   Janith Lima, MD  diclofenac sodium (VOLTAREN) 1 % GEL Apply 2 g topically 4 (four) times daily. Patient not taking: Reported on 04/09/2022 04/20/19   Antonietta Breach, PA-C  Dietary Management Product (FOSTEUM PLUS) CAPS Take 1 capsule by mouth 2 (two) times daily. Patient not taking: Reported on 04/09/2022 07/30/21   Janith Lima, MD  diphenoxylate-atropine (LOMOTIL) 2.5-0.025 MG tablet Take 1 tablet by mouth 4 (four) times daily as needed for diarrhea or loose stools. 04/09/22   Vladimir Crofts, PA-C  famotidine (PEPCID) 40 MG tablet TAKE 1 TABLET (40 MG TOTAL) BY MOUTH AT BEDTIME. Patient not taking: Reported on 04/09/2022 09/08/19   Janith Lima, MD  finasteride (PROPECIA) 1 MG tablet TAKE 1 TABLET AT BEDTIME Patient not taking: Reported on 04/09/2022 10/01/21   Janith Lima, MD  metoprolol tartrate (LOPRESSOR) 25 MG tablet TAKE 1 AND 1/2 TABLETS IN THE MORNING  AND TAKE 1 TABLET AT BEDTIME Patient not taking: Reported on 04/09/2022 05/14/21   Troy Sine, MD  Pitavastatin Calcium (LIVALO) 2 MG TABS Take 1 tablet (2 mg total) by  mouth daily. Patient not taking: Reported on 04/09/2022 11/14/21   Janith Lima, MD  potassium chloride (KLOR-CON M) 10 MEQ tablet TAKE 2 TABLETS DAILY (DOSE INCREASED DUE TO HYPOKALEMIA) Patient not taking: Reported on 04/09/2022 01/29/22   Troy Sine, MD  rivaroxaban (XARELTO) 20 MG TABS tablet Take 1 tablet (20 mg total) by mouth daily with supper. Patient not taking: Reported on 04/09/2022 11/14/21   Janith Lima, MD  sotalol (BETAPACE) 120 MG tablet TAKE 1 TABLET EVERY 12  HOURS Patient not taking: Reported on 04/09/2022 07/11/21   Troy Sine, MD  traMADol (ULTRAM) 50 MG tablet TAKE 1 TABLET BY MOUTH EVERY 6 HOURS AS NEEDED Patient not taking: Reported on 04/09/2022 12/20/20   Janith Lima, MD       Allergies    Crestor [rosuvastatin calcium], Acetaminophen-codeine, Amlodipine besylate, Hydrocodone-acetaminophen, Irbesartan-hydrochlorothiazide, Lisinopril, Omeprazole, Propoxyphene n-acetaminophen, Valsartan, Warfarin sodium, Diltiazem hcl, and Verapamil    Review of Systems   Review of Systems  Constitutional:  Negative for fever.  Respiratory:  Positive for chest tightness and shortness of breath. Negative for cough.   Cardiovascular:  Negative for chest pain.  Gastrointestinal:  Negative for abdominal pain, nausea and vomiting.  All other systems reviewed and are negative.   Physical Exam Updated Vital Signs BP (!) 145/66   Pulse (!) 54   Temp 98.2 F (36.8 C) (Oral)   Resp 13   Ht '5\' 6"'$  (1.676 m)   SpO2 100%   BMI 29.70 kg/m  Physical Exam Vitals and nursing note reviewed.  Constitutional:      General: She is not in acute distress.    Appearance: She is not diaphoretic.  HENT:     Head: Normocephalic and atraumatic.     Mouth/Throat:     Pharynx: No oropharyngeal exudate.  Eyes:     General: No scleral icterus.    Conjunctiva/sclera: Conjunctivae normal.  Cardiovascular:     Rate and Rhythm: Normal rate and regular rhythm.     Pulses: Normal pulses.     Heart sounds: Normal heart sounds.  Pulmonary:     Effort: Pulmonary effort is normal. No respiratory distress.     Breath sounds: Normal breath sounds. No wheezing.  Chest:     Chest wall: No tenderness.     Comments: No chest wall tenderness to palpation. Abdominal:     General: Bowel sounds are normal.     Palpations: Abdomen is soft. There is no mass.     Tenderness: There is no abdominal tenderness. There is no guarding or rebound.  Musculoskeletal:        General:  Normal range of motion.     Cervical back: Normal range of motion and neck supple.  Skin:    General: Skin is warm and dry.  Neurological:     Mental Status: She is alert.  Psychiatric:        Behavior: Behavior normal.     ED Results / Procedures / Treatments   Labs (all labs ordered are listed, but only abnormal results are displayed) Labs Reviewed  BASIC METABOLIC PANEL - Abnormal; Notable for the following components:      Result Value   Glucose, Bld 109 (*)    GFR, Estimated 59 (*)    All other components within normal limits  CBC  TROPONIN I (HIGH SENSITIVITY)  TROPONIN I (HIGH SENSITIVITY)    EKG EKG Interpretation  Date/Time:  Friday May 02 2022 10:51:28 EDT Ventricular Rate:  54 PR Interval:  188 QRS Duration: 94 QT Interval:  507 QTC Calculation: 481 R Axis:   -16 Text Interpretation: Sinus rhythm Borderline left axis deviation Low voltage, precordial leads Confirmed by Wynona Dove ([id number]) on 05/02/2022 12:27:30 PM  Radiology DG Chest Portable 1 View  Result Date: 05/02/2022 CLINICAL DATA:  Chest pain and shortness of breath. EXAM: PORTABLE CHEST 1 VIEW COMPARISON:  Chest radiographs 12/30/2021 FINDINGS: The cardiomediastinal silhouette is within normal limits. Aortic atherosclerosis is noted. Mild chronic peribronchial thickening is similar to the prior study. No acute airspace consolidation, overt pulmonary edema, sizable pleural effusion, or pneumothorax is identified. No acute osseous abnormality is seen. IMPRESSION: Chronic bronchitic changes. Electronically Signed   By: Logan Bores M.D.   On: 05/02/2022 11:07    Procedures Procedures    Medications Ordered in ED Medications  famotidine (PEPCID) IVPB 20 mg premix (0 mg Intravenous Stopped 05/02/22 1306)  alum & mag hydroxide-simeth (MAALOX/MYLANTA) 200-200-20 MG/5ML suspension 30 mL (30 mLs Oral Given 05/02/22 1240)    ED Course/ Medical Decision Making/ A&P Clinical Course as of 05/02/22 1530  Fri  May 02, 2022  1217 Re-evaluated and patient noted that she was no longer having any chest tightness and that it has resolved at this time. [SB]  1344 Re-evaluated and patient noted improvement of symptoms with treatment regimen in the ED. discussed with patient delta troponin.  Also discussed with patient discharge treatment plan.  Answered all available questions.  Patient appears safe for discharge at this time. [SB]    Clinical Course User Index [SB] Damone Fancher A, PA-C                           Medical Decision Making Amount and/or Complexity of Data Reviewed Labs: ordered. Radiology: ordered.  Risk OTC drugs. Prescription drug management.   Patient presents to the ED with chest tightness onset yesterday.  Notes that she had a tetanus while completing yard work.  History of catheterization without stents.  No history of MI. Vital signs patient afebrile, patient not hypoxic or tachycardic. On exam patient with no chest wall TTP. Otherwise, no acute cardiovascular, respiratory, abdominal findings. Differential diagnosis includes ACS, aortic dissection, pneumothorax, PE, PNA.    Labs:  I ordered, and personally interpreted labs.  The pertinent results include:   Initial and delta troponin of 5 CBC unremarkable BMP with slightly elevated glucose and slightly decreased GFR at 59  Imaging: I ordered imaging studies including CXR I independently visualized and interpreted imaging which showed: With chronic bronchitic changes I agree with the radiologist interpretation  Medications:  I ordered medication including Pepcid, Maalox for symptom management Reevaluation of the patient after these medicines and interventions, I reevaluated the patient and found that they have improved I have reviewed the patients home medicines and have made adjustments as needed   Disposition: Presentation suspicious for atypical chest pain.  Doubt A-fib with RVR at this time, EKG unremarkable.  EKG  without acute ST/T changes, troponins negative, chest x-ray negative, low suspicion for ACS at this time. Chest x-ray without acute findings, vital signs stable, doubt pneumonia or pneumothorax at this time.   Heart score, patient low risk.  Case discussed with attending who agrees with discharge treatment plan at this time. Pt with improvement of symptoms with treatment regimen in the ED. After consideration of the diagnostic results and the patients response to treatment, I feel that the patient would benefit from Discharge  home.  Discussed with patient importance of having close follow-up with her cardiologist and primary care provider regarding today's ED visit.  Supportive care measures and strict return precautions discussed with patient at bedside. Pt acknowledges and verbalizes understanding. Pt appears safe for discharge. Follow up as indicated in discharge paperwork.   This chart was dictated using voice recognition software, Dragon. Despite the best efforts of this provider to proofread and correct errors, errors may still occur which can change documentation meaning.   Final Clinical Impression(s) / ED Diagnoses Final diagnoses:  Atypical chest pain    Rx / DC Orders ED Discharge Orders          Ordered    Ambulatory referral to Cardiology       Comments: If you have not heard from the Cardiology office within the next 72 hours please call [phone].   05/02/22 1424              Sharunda Salmon A, PA-C 05/02/22 [place], [place], DO 05/06/22 [id number]

## 2022-05-02 NOTE — Telephone Encounter (Signed)
Spoke with patient's daughter. She reports patient "seems fine just looking at her".  Reports chest tightness since last night around 7pm (took a few tylenol)  BP 179/93 - reading after meds HR 58 -- took extra metoprolol b/c heart going in/out of rhythm (total '50mg'$ )  Advised they call EMS to be assessed and if she needs to be transported to hospital, this will be coordinated. Given known PAF, chest tightness, elevated BP, she may need further work up.   There are no appointments available today. Advised this and given that there is a long weekend, she shouldn't wait until next week for an appointment

## 2022-05-06 ENCOUNTER — Telehealth: Payer: Self-pay | Admitting: Cardiovascular Disease

## 2022-05-06 NOTE — Telephone Encounter (Signed)
Pt c/o Shortness Of Breath: STAT if SOB developed within the last 24 hours or pt is noticeably SOB on the phone  1. Are you currently SOB (can you hear that pt is SOB on the phone)? No   2. How long have you been experiencing SOB? Last week  3. Are you SOB when sitting or when up moving around? Moving around  4. Are you currently experiencing any other symptoms? Chest tightness

## 2022-05-06 NOTE — Telephone Encounter (Signed)
Spoke to patient she stated she has been having chest tightness,sob,tires easy off and on for the past 2 weeks.She called 911 and went to ED this past weekend.She was told pain related to acid reflux.She would like to be seen.Appointment scheduled with Almyra Deforest PA 9/6 at 3:35 pm.

## 2022-05-07 ENCOUNTER — Ambulatory Visit: Payer: Medicare HMO | Attending: Physician Assistant | Admitting: Nurse Practitioner

## 2022-05-07 ENCOUNTER — Encounter: Payer: Self-pay | Admitting: Physician Assistant

## 2022-05-07 VITALS — BP 132/66 | HR 62 | Ht 66.0 in | Wt 185.0 lb

## 2022-05-07 DIAGNOSIS — R0609 Other forms of dyspnea: Secondary | ICD-10-CM | POA: Diagnosis not present

## 2022-05-07 DIAGNOSIS — I2510 Atherosclerotic heart disease of native coronary artery without angina pectoris: Secondary | ICD-10-CM | POA: Diagnosis not present

## 2022-05-07 DIAGNOSIS — I480 Paroxysmal atrial fibrillation: Secondary | ICD-10-CM

## 2022-05-07 DIAGNOSIS — M7989 Other specified soft tissue disorders: Secondary | ICD-10-CM

## 2022-05-07 DIAGNOSIS — R5383 Other fatigue: Secondary | ICD-10-CM

## 2022-05-07 DIAGNOSIS — E785 Hyperlipidemia, unspecified: Secondary | ICD-10-CM | POA: Diagnosis not present

## 2022-05-07 DIAGNOSIS — I10 Essential (primary) hypertension: Secondary | ICD-10-CM | POA: Diagnosis not present

## 2022-05-07 DIAGNOSIS — R0602 Shortness of breath: Secondary | ICD-10-CM

## 2022-05-07 DIAGNOSIS — I739 Peripheral vascular disease, unspecified: Secondary | ICD-10-CM | POA: Diagnosis not present

## 2022-05-07 DIAGNOSIS — R0789 Other chest pain: Secondary | ICD-10-CM | POA: Diagnosis not present

## 2022-05-07 DIAGNOSIS — I340 Nonrheumatic mitral (valve) insufficiency: Secondary | ICD-10-CM

## 2022-05-07 NOTE — Patient Instructions (Addendum)
Medication Instructions:  Your physician recommends that you continue on your current medications as directed. Please refer to the Current Medication list given to you today.  *If you need a refill on your cardiac medications before your next appointment, please call your pharmacy*  Lab Work: NONE ordered at this time of appointment   If you have labs (blood work) drawn today and your tests are completely normal, you will receive your results only by: [facility] (if you have MyChart) OR A paper copy in the mail If you have any lab test that is abnormal or we need to change your treatment, we will call you to review the results.  Testing/Procedures: Your physician has requested that you have an echocardiogram. Echocardiography is a painless test that uses sound waves to create images of your heart. It provides your doctor with information about the size and shape of your heart and how well your heart's chambers and valves are working. This procedure takes approximately one hour. There are no restrictions for this procedure.  Your physician has requested that you have a lower extremity venous duplex. This test is an ultrasound of the veins in the legs or arms. It looks at venous blood flow that carries blood from the heart to the legs or arms. Allow one hour for a Lower Venous exam. Allow thirty minutes for an Upper Venous exam. There are no restrictions or special instructions.  Follow-Up: At [facility], you and your health needs are our priority.  As part of our continuing mission to provide you with exceptional heart care, we have created designated Provider Care Teams.  These Care Teams include your primary Cardiologist (physician) and Advanced Practice Providers (APPs -  Physician Assistants and Nurse Practitioners) who all work together to provide you with the care you need, when you need it.  Your next appointment:   1 month(s)  The format for your next appointment:   In  Person  Provider:   Almyra Deforest, PA-C          Important Information About Sugar

## 2022-05-07 NOTE — Progress Notes (Signed)
Cardiology Office Note:    Date:  05/08/2022   ID:  [name], DOB [birth year], MRN [id number]  PCP:  Janith Lima, MD   [facility] Cardiologist:  Shelva Majestic, MD     Referring MD: Janith Lima, MD   CC: Chest tightness, fatigue, and shortness of breath  History of Present Illness:    [name] is a 88 y.o. 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 Has also been treated with sotalol in the past and has been followed by Dr. Rayann Heman. Cardiac catheterization in 2016 revealed no significant coronary obstructive disease with the midportion of the mid LAD dripping intramyocardial lead without evidence for systolic bridging, smooth 10% narrowing in the AV groove circumflex coronary artery, and normal dominant RCA.  Medical therapy recommended.  Has a history of PAD, claudication symptoms requiring ABIs that were found to be abnormal with left ABI 0.63.  The left common iliac external iliac and common femoral artery and profunda demonstrated multiphasic flow, however, found to have superficial femoral artery on the left that showed occlusive disease in the proximal to mid thigh, with reconstruction of flow in the mid distal area.  Popliteal artery showed patent monophasic flow with three-vessel runoff.  Anterior tibial artery demonstrated stenosis that was located in the local proximal calf and flow noted distal to this. Last seen by Dr. Claiborne Billings in November 2022, was unaware of any recurrent atrial fibrillation episodes, admitted to increased stress in the household.  Husband had stage IV lung cancer with increasing dementia.  Noted some mild swelling  of right lower extremity greater than left lower extremity, blood pressure was stable. Was told to f/u in 1 year.    Presented to the emergency department on May 02, 2022 with chief complaint of chest tightness - noted this with exertion after doing some yard work, also had some shortness of breath.  EMS gave her 2 sublingual nitroglycerin.  Stated she had relief of chest tightness after first nitroglycerin dose, however chest tightness returned after the second dose.  Felt like her heart was going in and out of rhythm, so she took an additional dose of metoprolol.  EKG unremarkable without acute ST/T changes, troponins negative, chest x-ray negative, was low suspicion for ACS at that time.  Chest x-ray was unremarkable, vital signs are stable, she was discharged home as she was told this pain was related to acid reflux.    She contacted our office on 05/06/22 stating she's been having chest tightness, SOB, tires easily on and off X 2 weeks. Today she presents for a requested follow up appointment.  States for the past couple weeks she has experienced fatigue and no energy.  Had an episode last Thursday night and Friday where she experienced chest tightness, could not catch her breath.  States episode was located along the mid chest and upper abdomen and rated it an 8 out of 10 in intensity.  She says the first dose of nitroglycerin helped relieve the chest tightness but second dose of nitroglycerin brought it on.  She has not had any episodes of chest tightness since but continues to feel fatigued and SHOB with exertion.  She says during that time  a chest tightness she got a funny feeling in her chest, thought her heart might be going out of rhythm; however this was a different feeling than when she went into A-fib previously.  She stated she took an extra dose of metoprolol as recommended by the cardiology team and that seemed to help.  Stated her blood pressure was elevated at that time-179/93. Says she  feels worn out and breathless occasionally with exertion. Requesting Livalo and Xarelto samples today as she says she is in a donut hole. Does admit to swelling in lower extremities (right greater than left). Denies any current chest pain, palpitations, fast heart rates, significant weight changes, orthopnea, PND, dizziness, syncope, presyncope, any acute bleeding, or claudication.    Past Medical History:  Diagnosis Date   Abnormal nuclear stress test    mild anterolateral septal and inferior ischemia   Arthritis    Atherosclerosis of lower extremity with claudication (HCC) 04/17/2015   LEFT LEG   Atrial fibrillation (HCC)    Cardiac arrhythmia due to congenital heart disease    Claudication [facility])    Colon cancer (HCC)    Coronary atherosclerosis of unspecified type of vessel, native or graft    Esophageal stricture    Fatty liver 11/05/2011   Hiatal hernia    HLD (hyperlipidemia)    Hypertension    IBS (irritable bowel syndrome)    Iron deficiency anemia, unspecified    Ischemic colitis (HCC)    Neuropathy    Osteoporosis    PAF (paroxysmal atrial fibrillation) ([place]) 04/2015   Ulcerative colitis ([place])    Unspecified vascular insufficiency of intestine     Past Surgical History:  Procedure Laterality Date   abdominoperineal resection anastomotic stricture  05/03/2007   APPENDECTOMY     CARDIAC CATHETERIZATION N/A 03/13/2015   Procedure: Left Heart Cath and Coronary Angiography;  Surgeon: Troy Sine, MD;  Location: [facility];  Service: Cardiovascular;  Laterality: N/A;   CARDIOVERSION N/A 04/20/2015   Procedure: CARDIOVERSION;  Surgeon: Jerline Pain, MD;  Location: [place];  Service: Cardiovascular;  Laterality: N/A;   CARDIOVERSION N/A 07/10/2017   Procedure: CARDIOVERSION;  Surgeon: Jerline Pain, MD;  Location: [facility];  Service: Cardiovascular;  Laterality: N/A;   COLON SURGERY     DILATION AND CURETTAGE OF UTERUS  09/01/1990    ESOPHAGOGASTRODUODENOSCOPY N/A 04/27/2014   Procedure: ESOPHAGOGASTRODUODENOSCOPY (EGD);  Surgeon: Lafayette Dragon, MD;  Location: [facility];  Service: Endoscopy;  Laterality: N/A;   Mandibular Renstruction     ROTATOR CUFF REPAIR  09/01/2004   left   Rt. [place]o oophorectomy and cyst removal  09/02/2003   SAVORY DILATION N/A 04/27/2014   Procedure: SAVORY DILATION;  Surgeon: Lafayette Dragon, MD;  Location: [facility];  Service: Endoscopy;  Laterality: N/A;  no xray needed   sigmoid resection for invasive rectal adenocarcinoma  12/31/2006   TEE WITHOUT CARDIOVERSION N/A 04/20/2015   Procedure: TRANSESOPHAGEAL ECHOCARDIOGRAM (TEE);  Surgeon: Jerline Pain, MD;  Location: [facility];  Service: Cardiovascular;  Laterality: N/A;   TUBAL LIGATION     WRIST SURGERY  09/01/2006   right    Current Medications: Current Meds  Medication Sig   acetaminophen (TYLENOL) 500 MG tablet Take 1,000 mg by mouth every 6 (six) hours as needed for headache (pain).   amitriptyline (ELAVIL) 10 MG tablet Take 1 tablet (10 mg total) by mouth at bedtime.   Calcium Carbonate-Vitamin D (CALCIUM-D PO) Take 1 tablet by mouth at bedtime.  Calcium 630 mg, Vitamin D3 500 units   chlorthalidone (HYGROTON) 25 MG tablet Take 1 tablet (25 mg total) by mouth daily.   cyanocobalamin 2000 MCG tablet Take 1 tablet (2,000 mcg total) by mouth every other day.   diclofenac sodium (VOLTAREN) 1 % GEL Apply 2 g topically 4 (four) times daily.   Dietary Management Product (FOSTEUM PLUS) CAPS Take 1 capsule by mouth 2 (two) times daily.   diphenoxylate-atropine (LOMOTIL) 2.5-0.025 MG tablet Take 1 tablet by mouth 4 (four) times daily as needed for diarrhea or loose stools.   famotidine (PEPCID) 40 MG tablet TAKE 1 TABLET (40 MG TOTAL) BY MOUTH AT BEDTIME.   finasteride (PROPECIA) 1 MG tablet TAKE 1 TABLET AT BEDTIME   metoprolol tartrate (LOPRESSOR) 25 MG tablet TAKE 1 AND 1/2 TABLETS IN THE MORNING  AND TAKE 1 TABLET AT BEDTIME    Pitavastatin Calcium (LIVALO) 2 MG TABS Take 1 tablet (2 mg total) by mouth daily.   potassium chloride (KLOR-CON M) 10 MEQ tablet TAKE 2 TABLETS DAILY (DOSE INCREASED DUE TO HYPOKALEMIA)   rivaroxaban (XARELTO) 20 MG TABS tablet Take 1 tablet (20 mg total) by mouth daily with supper.   sotalol (BETAPACE) 120 MG tablet TAKE 1 TABLET EVERY 12 HOURS   traMADol (ULTRAM) 50 MG tablet TAKE 1 TABLET BY MOUTH EVERY 6 HOURS AS NEEDED     Allergies:   Crestor [rosuvastatin calcium], Acetaminophen-codeine, Amlodipine besylate, Hydrocodone-acetaminophen, Irbesartan-hydrochlorothiazide, Lisinopril, Omeprazole, Propoxyphene n-acetaminophen, Valsartan, Warfarin sodium, Diltiazem hcl, and Verapamil   Social History   Socioeconomic History   Marital status: Widowed    Spouse name: Not on file   Number of children: 4   Years of education: 12th grade   Highest education level: Not on file  Occupational History   Occupation: retired    Fish farm manager: RETIRED  Tobacco Use   Smoking status: Never   Smokeless tobacco: Never  Vaping Use   Vaping Use: Never used  Substance and Sexual Activity   Alcohol use: No   Drug use: No   Sexual activity: Not Currently  Other Topics Concern   Not on file  Social History Narrative   Lives at home with fiance.   Right-handed.   No caffeine use.   Social Determinants of Health   Financial Resource Strain: Low Risk  (11/13/2021)   Overall Financial Resource Strain (CARDIA)    Difficulty of Paying Living Expenses: Not hard at all  Food Insecurity: No Food Insecurity (11/13/2021)   Hunger Vital Sign    Worried About Running Out of Food in the Last Year: Never true    Ran Out of Food in the Last Year: Never true  Transportation Needs: No Transportation Needs (11/13/2021)   PRAPARE - Hydrologist (Medical): No    Lack of Transportation (Non-Medical): No  Physical Activity: Inactive (11/13/2021)   Exercise Vital Sign    Days of Exercise per  Week: 0 days    Minutes of Exercise per Session: 0 min  Stress: No Stress Concern Present (11/13/2021)   [place]    Feeling of Stress : Not at all  Social Connections: Moderately Integrated (11/13/2021)   Social Connection and Isolation Panel [NHANES]    Frequency of Communication with Friends and Family: More than three times a week    Frequency of Social Gatherings with Friends and Family: More than three times a week    Attends Religious Services: More than  4 times per year    Active Member of Clubs or Organizations: Yes    Attends Archivist Meetings: More than 4 times per year    Marital Status: Widowed     Family History: The patient's family history includes Alzheimer's disease in her mother; Colon cancer (age of onset: 10) in her father; Diabetes in her son and son; Heart disease in her father; Hypertension in her father, sister, son, and son; Mitral valve prolapse in her sister; Thyroid disease in her mother and sister. There is no history of Esophageal cancer, Rectal cancer, or Stomach cancer.  ROS:   Review of Systems  Constitutional:  Positive for malaise/fatigue. Negative for chills, diaphoresis, fever and weight loss.  HENT: Negative.    Eyes: Negative.   Respiratory:  Positive for shortness of breath. Negative for cough, hemoptysis, sputum production and wheezing.        See HPI.  Cardiovascular:  Positive for chest pain and leg swelling. Negative for palpitations, orthopnea, claudication and PND.       See HPI.  Gastrointestinal:  Positive for abdominal pain. Negative for blood in stool, constipation, diarrhea, heartburn, melena, nausea and vomiting.       See HPI.  Genitourinary: Negative.   Musculoskeletal: Negative.   Skin: Negative.   Neurological: Negative.   Endo/Heme/Allergies: Negative.   Psychiatric/Behavioral: Negative.      Please see the history of present illness.    All  other systems reviewed and are negative.  EKGs/Labs/Other Studies Reviewed:    The following studies were reviewed today:   EKG:  EKG is ordered today.  The ekg ordered today demonstrates NSR, 62 bpm, nonspecific ST segment changes, otherwise no acute changes.   2D echocardiogram on February 06, 2017: Left ventricle: The cavity size was normal. Wall thickness was    increased in a pattern of moderate LVH. Systolic function was    normal. The estimated ejection fraction was in the range of 60%    to 65%. Wall motion was normal; there were no regional wall    motion abnormalities. Features are consistent with a pseudonormal    left ventricular filling pattern, with concomitant abnormal    relaxation and increased filling pressure (grade 2 diastolic    dysfunction).   -------------------------------------------------------------------  Study data:  Comparison was made to the study of 04/18/2015.  Study  status:  Routine.  Procedure:  The patient reported no pain pre or  post test. Transthoracic echocardiography for left ventricular  function evaluation and for assessment of valvular function. Image  quality was adequate.  Study completion:  There were no  complications.          Transthoracic echocardiography.  M-mode,  complete 2D, spectral Doppler, and color Doppler.  Birthdate:  Patient birthdate: 12-11-1932.  Age:  Patient is 88 yr old.  Sex:  Gender: female.    BMI: 32.1 kg/m^2.  Blood pressure:     139/75  Patient status:  Outpatient.  Study date:  Study date: 02/06/2017.  Study time: 10:37 AM.  Location:  [facility]   -------------------------------------------------------------------   -------------------------------------------------------------------  Left ventricle:  The cavity size was normal. Wall thickness was  increased in a pattern of moderate LVH. Systolic function was  normal. The estimated ejection fraction was in the range of 60% to  65%. Wall motion was  normal; there were no regional wall motion  abnormalities. Features are consistent with a pseudonormal left  ventricular filling pattern, with concomitant abnormal  relaxation  and increased filling pressure (grade 2 diastolic dysfunction).   -------------------------------------------------------------------  Aortic valve:   Mildly thickened leaflets. Cusp separation was  normal.  Doppler:  Transvalvular velocity was within the normal  range. There was no stenosis. There was no regurgitation.   -------------------------------------------------------------------  Aorta: Aortic root: The aortic root was normal in size.  Ascending aorta: The ascending aorta was normal in size.   -------------------------------------------------------------------  Mitral valve:   Structurally normal valve.   Leaflet separation was  normal.  Doppler:  Transvalvular velocity was within the normal  range. There was no evidence for stenosis. There was trivial  regurgitation.    Peak gradient (D): 4 mm Hg.   -------------------------------------------------------------------  Left atrium:  The atrium was normal in size.   -------------------------------------------------------------------  Right ventricle:  The cavity size was normal. Systolic function was  normal.   -------------------------------------------------------------------  Pulmonic valve:    The valve appears to be grossly normal.  Doppler:  There was trivial regurgitation.   -------------------------------------------------------------------  Tricuspid valve:   The valve appears to be grossly normal.  Doppler:  There was trivial regurgitation.   -------------------------------------------------------------------  Pulmonary artery:   Systolic pressure was within the normal range.    -------------------------------------------------------------------  Right atrium:  The atrium was normal in size.    -------------------------------------------------------------------  Pericardium: There was no pericardial effusion.   -------------------------------------------------------------------  Systemic veins:  Inferior vena cava: The vessel was normal in size. The  respirophasic diameter changes were in the normal range (>= 50%),  consistent with normal central venous pressure.   Cardiac monitor on 05/09/2018: Sinus rhythm, average heart rate 63 bpm, slowest heart rate 48 bpm, artifact present, no episodes of atrial fibrillation, no significant ectopy noted.  Left heart cath on March 13, 2015: Mid Cx lesion, 10% stenosed. The left ventricular systolic function is normal.   Normal LV function with an estimated ejection fraction of 55-60%.   No significant coronary obstructive disease with the midportion of the mid LAD dipping intramyocardially without evidence for systolic bridging, smooth 10% narrowing in the AV groove circumflex  coronary artery, and normal dominant RCA.   Recommendation:   Continued medical therapy.  The patient is scheduled for PV angiography on 04/02/2015 after Doppler study suggested occlusive disease in the left SFA.  Nuclear medicine stress test on March 09, 2015: The left ventricular ejection fraction is hyperdynamic (>65%). Nuclear stress EF: 71%. There was no ST segment deviation noted during stress. No T wave inversion was noted during stress. Defect 1: There is a small defect of mild severity. Findings consistent with ischemia. This is a low risk study.   Low risk stress nuclear study with a very small area of mild apicoseptal ischemia and normal left ventricular regional and global systolic function.   Recent Labs: 05/15/2021: TSH 1.39 12/30/2021: Magnesium 1.6 04/09/2022: ALT 14 05/02/2022: BUN 18; Creatinine, Ser 0.93; Hemoglobin 14.9; Platelets 182; Potassium 3.5; Sodium 141  Recent Lipid Panel    Component Value Date/Time   CHOL 180 05/15/2021 1414    CHOL 184 09/14/2019 0944   TRIG 159.0 (H) 05/15/2021 1414   HDL 53.80 05/15/2021 1414   HDL 54 09/14/2019 0944   CHOLHDL 3 05/15/2021 1414   VLDL 31.8 05/15/2021 1414   LDLCALC 95 05/15/2021 1414   LDLCALC 99 09/14/2019 0944   LDLDIRECT 150.0 12/28/2017 1340     Risk Assessment/Calculations:    CHA2DS2-VASc Score = 5  This indicates a 7.2% annual risk of stroke. The patient's score  is based upon: CHF History: 0 HTN History: 1 Diabetes History: 0 Stroke History: 0 Vascular Disease History: 1 Age Score: 2 Gender Score: 1    Physical Exam:    VS:  BP 132/66   Pulse 62   Ht '5\' 6"'$  (1.676 m)   Wt 185 lb (83.9 kg)   SpO2 95%   BMI 29.86 kg/m     Wt Readings from Last 3 Encounters:  05/07/22 185 lb (83.9 kg)  04/09/22 184 lb (83.5 kg)  01/09/22 186 lb 3.2 oz (84.5 kg)     GEN: Well nourished, well developed in no acute distress HEENT: Normal NECK: No JVD; No carotid bruits CARDIAC: RRR, no murmurs, rubs, gallops; 2+ peripheral pulses along radial pulses, and equal bilaterally, +1 peripheral pulses along posterior tibials, equal bilaterally. RESPIRATORY:  Clear to auscultation without rales, wheezing or rhonchi  ABDOMEN: Soft, non-tender, non-distended MUSCULOSKELETAL: 2+ edema along right lower extremity, negative Homans' sign, +1 edema along left lower extremity, otherwise normal dema; No deformity  SKIN: Warm and dry NEUROLOGIC:  Alert and oriented x 3 PSYCHIATRIC:  Normal affect   ASSESSMENT:    1. Paroxysmal atrial fibrillation (HCC)   2. Mitral valve insufficiency, unspecified etiology   3. Hypertension, unspecified type   4. Coronary artery disease involving native heart without angina pectoris, unspecified vessel or lesion type   5. Hyperlipidemia, unspecified hyperlipidemia type   6. PVD (peripheral vascular disease) ([place])   7. Fatigue, unspecified type   8. SOB (shortness of breath)   9. Swelling of both lower extremities    PLAN:    In order of  problems listed above:  Atypical chest pain - resolved She has had no more chest tenderness since her past ED visit.  Discussed further ischemic evaluation options, but she defers this at this time.  Gust ED precautions and she verbalizes understanding.  If this chest tenderness recurs by the time of next follow-up visit, we will pursue ischemic evaluation.  2.  Shortness of breath on exertion, fatigue This has been going on for the past several weeks, symptoms have remained the same and have not improved.  Echocardiogram in 2018 revealed LVEF 09-32%, grade 2 diastolic dysfunction noted, trivial TR, mildly thickened aortic valve leaflets without stenosis or regurgitation.  We will update 2D echo at this time.  She would like to defer TSH to be drawn with her PCP at her upcoming appointment in the next week or two.  Heart healthy diet and low-salt diet recommended.  3.  Swelling in bilateral lower extremities (R>L)  Does admit to some dependent edema, says this is chronic, and states right lower extremity swelling is greater than left lower extremity swelling.  We will obtain bilateral Doppler to rule out DVT.  Negative Homans' sign on examination.  Pt states this may be due to recent salt intake, recommended low-salt diet.  If continued swelling at next office visit and Doppler is negative for DVT bilaterally, plan to recommend compression stockings.  4. CAD, PAD, HLD - chronic, stable No episodes of chest wall tenderness since ED visit as mentioned above.  Defers ischemic evaluation at this time.  She has documented PVD with reduced ABI in the left lower extremity, denies any claudication symptoms currently.  Continue current therapy.  Heart healthy diet and low-salt diet recommended.   5.  PAF - chronic, stable States she wondered if she was going into A-fib last week prior to ED visit.  States she took an extra metoprolol  dose that improved her symptoms.  States the symptoms she experienced were  different than previous A-fib episodes.  Last cardioversion was Dec 30, 2021.  Continue current medication regimen.  Today she was given Xarelto samples for donut hole.  Continue current medication regimen.  She is on appropriate dosing of Xarelto.  6.  HTN - chronic, stable Blood pressure today 132/66.  Discussed systolic blood pressure reading goal is less than 130. Discussed to monitor BP at home at least 2 hours after medications and sitting for 5-10 minutes.  Continue current medication regimen at this time.  Encouraged her to log her blood pressure and at next visit if blood pressure is not at goal, plan to uptitrate metoprolol tartrate dosage.  Recent BMET showed stable kidney function.  7. Disposition: Follow-up with Almyra Deforest, PA-C or APP in 1 month or sooner if anything changes.    Medication Adjustments/Labs and Tests Ordered: Current medicines are reviewed at length with the patient today.  Concerns regarding medicines are outlined above.  Orders Placed This Encounter  Procedures   EKG 12-Lead   ECHOCARDIOGRAM COMPLETE   VAS [place] LOWER EXTREMITY VENOUS (DVT)   No orders of the defined types were placed in this encounter.   Patient Instructions  Medication Instructions:  Your physician recommends that you continue on your current medications as directed. Please refer to the Current Medication list given to you today.  *If you need a refill on your cardiac medications before your next appointment, please call your pharmacy*  Lab Work: NONE ordered at this time of appointment   If you have labs (blood work) drawn today and your tests are completely normal, you will receive your results only by: [place] (if you have MyChart) OR A paper copy in the mail If you have any lab test that is abnormal or we need to change your treatment, we will call you to review the results.  Testing/Procedures: Your physician has requested that you have an echocardiogram. Echocardiography is a  painless test that uses sound waves to create images of your heart. It provides your doctor with information about the size and shape of your heart and how well your heart's chambers and valves are working. This procedure takes approximately one hour. There are no restrictions for this procedure.  Your physician has requested that you have a lower extremity venous duplex. This test is an ultrasound of the veins in the legs or arms. It looks at venous blood flow that carries blood from the heart to the legs or arms. Allow one hour for a Lower Venous exam. Allow thirty minutes for an Upper Venous exam. There are no restrictions or special instructions.  Follow-Up: At [facility], you and your health needs are our priority.  As part of our continuing mission to provide you with exceptional heart care, we have created designated Provider Care Teams.  These Care Teams include your primary Cardiologist (physician) and Advanced Practice Providers (APPs -  Physician Assistants and Nurse Practitioners) who all work together to provide you with the care you need, when you need it.  Your next appointment:   1 month(s)  The format for your next appointment:   In Person  Provider:   Almyra Deforest, PA-C          Important Information About Sugar         Signed, [name], NP  05/08/2022 5:37 AM    [place]

## 2022-05-09 NOTE — Telephone Encounter (Signed)
Patient seen on 09/06

## 2022-05-13 ENCOUNTER — Ambulatory Visit (HOSPITAL_COMMUNITY)
Admission: RE | Admit: 2022-05-13 | Discharge: 2022-05-13 | Disposition: A | Discharge status: Home / Self Care | Payer: Medicare HMO | Source: Ambulatory Visit | Attending: Cardiology | Admitting: Cardiology

## 2022-05-13 DIAGNOSIS — M7989 Other specified soft tissue disorders: Secondary | ICD-10-CM | POA: Diagnosis not present

## 2022-05-19 ENCOUNTER — Ambulatory Visit (INDEPENDENT_AMBULATORY_CARE_PROVIDER_SITE_OTHER): Payer: Medicare HMO | Admitting: Internal Medicine

## 2022-05-19 ENCOUNTER — Encounter: Payer: Self-pay | Admitting: Internal Medicine

## 2022-05-19 VITALS — BP 144/78 | HR 60 | Temp 98.6°F | Resp 16 | Ht 66.0 in | Wt 185.0 lb

## 2022-05-19 DIAGNOSIS — Z0001 Encounter for general adult medical examination with abnormal findings: Secondary | ICD-10-CM

## 2022-05-19 DIAGNOSIS — G320 Subacute combined degeneration of spinal cord in diseases classified elsewhere: Secondary | ICD-10-CM

## 2022-05-19 DIAGNOSIS — E538 Deficiency of other specified B group vitamins: Secondary | ICD-10-CM | POA: Diagnosis not present

## 2022-05-19 DIAGNOSIS — I480 Paroxysmal atrial fibrillation: Secondary | ICD-10-CM

## 2022-05-19 DIAGNOSIS — I10 Essential (primary) hypertension: Secondary | ICD-10-CM

## 2022-05-19 DIAGNOSIS — G629 Polyneuropathy, unspecified: Secondary | ICD-10-CM | POA: Diagnosis not present

## 2022-05-19 DIAGNOSIS — E785 Hyperlipidemia, unspecified: Secondary | ICD-10-CM | POA: Diagnosis not present

## 2022-05-19 DIAGNOSIS — M818 Other osteoporosis without current pathological fracture: Secondary | ICD-10-CM

## 2022-05-19 DIAGNOSIS — Z0000 Encounter for general adult medical examination without abnormal findings: Secondary | ICD-10-CM

## 2022-05-19 DIAGNOSIS — E669 Obesity, unspecified: Secondary | ICD-10-CM

## 2022-05-19 LAB — VITAMIN B12: Vitamin B-12: 675 pg/mL (ref 211–911)

## 2022-05-19 LAB — TSH: TSH: 1.38 u[IU]/mL (ref 0.35–5.50)

## 2022-05-19 LAB — FOLATE: Folate: >23.9 ng/mL (ref >5.9)

## 2022-05-19 NOTE — Progress Notes (Signed)
Subjective:  Patient ID: [name], female    DOB: 01-05-1933  Age: 88 y.o. MRN: [id number]  CC: Annual Exam   HPI [name] presents for a CPX and f/up -  She was recently admitted for atypical chest pain.  The chest pain has resolved and she now thinks it was a GI discomfort.  She denies abdominal pain, nausea, vomiting, or diarrhea.  She continues to complain of paresthesias.  No  Outpatient Medications Prior to Visit  Medication Sig Dispense Refill   acetaminophen (TYLENOL) 500 MG tablet Take 1,000 mg by mouth every 6 (six) hours as needed for headache (pain).     Calcium Carbonate-Vitamin D (CALCIUM-D PO) Take 1 tablet by mouth at bedtime. Calcium 630 mg, Vitamin D3 500 units     chlorthalidone (HYGROTON) 25 MG tablet Take 1 tablet (25 mg total) by mouth daily. 90 tablet 0   cyanocobalamin 2000 MCG tablet Take 1 tablet (2,000 mcg total) by mouth every other day. 45 tablet 1   diclofenac sodium (VOLTAREN) 1 % GEL Apply 2 g topically 4 (four) times daily. 100 g 1   diphenoxylate-atropine (LOMOTIL) 2.5-0.025 MG tablet Take 1 tablet by mouth 4 (four) times daily as needed for diarrhea or loose stools. 30 tablet 0   famotidine (PEPCID) 40 MG tablet TAKE 1 TABLET (40 MG TOTAL) BY MOUTH AT BEDTIME. 90 tablet 1   metoprolol tartrate (LOPRESSOR) 25 MG tablet TAKE 1 AND 1/2 TABLETS IN THE MORNING  AND TAKE 1 TABLET AT BEDTIME 225 tablet 0   Pitavastatin Calcium (LIVALO) 2 MG TABS Take 1 tablet (2 mg total) by mouth daily. 90 tablet 1   potassium chloride (KLOR-CON M) 10 MEQ tablet TAKE 2 TABLETS DAILY (DOSE INCREASED DUE TO HYPOKALEMIA) 180 tablet 1   rivaroxaban (XARELTO) 20 MG TABS tablet Take 1 tablet (20 mg total) by mouth daily with supper. 90 tablet 1   sotalol (BETAPACE) 120 MG tablet TAKE 1 TABLET EVERY 12 HOURS 180 tablet 3   traMADol (ULTRAM) 50 MG tablet TAKE 1 TABLET BY MOUTH EVERY 6 HOURS AS NEEDED 90 tablet 5   Dietary Management Product (FOSTEUM PLUS) CAPS Take 1  capsule by mouth 2 (two) times daily. 180 capsule 1   finasteride (PROPECIA) 1 MG tablet TAKE 1 TABLET AT BEDTIME 90 tablet 1   amitriptyline (ELAVIL) 10 MG tablet Take 1 tablet (10 mg total) by mouth at bedtime. (Patient not taking: Reported on 05/19/2022) 90 tablet 1   No facility-administered medications prior to visit.    ROS Review of Systems  Constitutional: Negative.  Negative for diaphoresis and fatigue.  HENT: Negative.    Eyes: Negative.   Respiratory:  Positive for shortness of breath (doe). Negative for cough, chest tightness and wheezing.   Cardiovascular:  Negative for chest pain, palpitations and leg swelling.  Gastrointestinal:  Positive for constipation. Negative for abdominal pain, diarrhea, nausea and vomiting.  Endocrine: Negative.   Genitourinary: Negative.  Negative for difficulty urinating.  Musculoskeletal:  Positive for arthralgias. Negative for back pain, joint swelling and myalgias.  Skin: Negative.   Allergic/Immunologic: Negative.   Neurological:  Negative for dizziness, weakness and numbness.       + tingling  Hematological:  Negative for adenopathy. Does not bruise/bleed easily.  Psychiatric/Behavioral: Negative.      Objective:  BP (!) 144/78 (BP Location: Left Arm, Patient Position: Sitting, Cuff Size: Normal)   Pulse 60   Temp 98.6 F (37 C)   Resp  16   Ht 5\' 6"  (1.676 m)   Wt 185 lb (83.9 kg)   SpO2 96%   BMI 29.86 kg/m   BP Readings from Last 3 Encounters:  05/19/22 (!) 144/78  05/07/22 132/66  05/02/22 (!) 150/68    Wt Readings from Last 3 Encounters:  05/19/22 185 lb (83.9 kg)  05/07/22 185 lb (83.9 kg)  04/09/22 184 lb (83.5 kg)    Physical Exam Vitals reviewed.  HENT:     Nose: Nose normal.     Mouth/Throat:     Mouth: Mucous membranes are moist.  Eyes:     General: No scleral icterus.    Conjunctiva/sclera: Conjunctivae normal.  Cardiovascular:     Heart sounds: No murmur heard.    No gallop.  Pulmonary:      Effort: Pulmonary effort is normal.     Breath sounds: No stridor. No wheezing, rhonchi or rales.  Abdominal:     General: Abdomen is flat.     Palpations: There is no mass.     Tenderness: There is no abdominal tenderness. There is no guarding.     Hernia: No hernia is present.  Musculoskeletal:        General: Normal range of motion.     Cervical back: Neck supple.     Right lower leg: No edema.     Left lower leg: No edema.  Lymphadenopathy:     Cervical: No cervical adenopathy.  Skin:    General: Skin is warm and dry.  Neurological:     General: No focal deficit present.     Mental Status: She is alert.  Psychiatric:        Mood and Affect: Mood normal.        Behavior: Behavior normal.     Lab Results  Component Value Date   WBC 4.9 05/02/2022   HGB 14.9 05/02/2022   HCT 44.3 05/02/2022   PLT 182 05/02/2022   GLUCOSE 109 (H) 05/02/2022   CHOL 185 05/19/2022   TRIG 185.0 (H) 05/19/2022   HDL 49.40 05/19/2022   LDLDIRECT 150.0 12/28/2017   LDLCALC 99 05/19/2022   ALT 14 04/09/2022   AST 19 04/09/2022   NA 141 05/02/2022   K 3.5 05/02/2022   CL 104 05/02/2022   CREATININE 0.93 05/02/2022   BUN 18 05/02/2022   CO2 28 05/02/2022   TSH 1.38 05/19/2022   INR 1.02 07/07/2017   HGBA1C 5.5 12/18/2016    VAS [place] LOWER EXTREMITY VENOUS (DVT)  Result Date: 05/14/2022  Lower Venous DVT Study Patient Name:  [name]  Date of Exam:   05/13/2022 Medical Rec #: [id number]        Accession #:    [id number] Date of Birth: 10-26-1932        Patient Gender: F Patient Age:   89 years Exam Location:  [facility] Procedure:      VAS [place] LOWER EXTREMITY VENOUS (DVT) Referring Phys: Lanora Manis PECK --------------------------------------------------------------------------------  Indications: Patient has had intermittent swelling in both legs for about 1 year. She has has atrial fibrillation with increased SOB and fatigue with this. She has had some chest tightness.  Anticoagulation:  Xarelto. Comparison Study: None Performing Technologist: Alecia Mackin RVT, RDCS (AE), RDMS  Examination Guidelines: A complete evaluation includes B-mode imaging, spectral Doppler, color Doppler, and power Doppler as needed of all accessible portions of each vessel. Bilateral testing is considered an integral part of a complete examination. Limited examinations for reoccurring indications may be  performed as noted. The reflux portion of the exam is performed with the patient in reverse Trendelenburg.  +---------+---------------+---------+-----------+----------+--------------+ RIGHT    CompressibilityPhasicitySpontaneityPropertiesThrombus Aging +---------+---------------+---------+-----------+----------+--------------+ CFV      Full           Yes      Yes                                 +---------+---------------+---------+-----------+----------+--------------+ SFJ      Full           Yes      Yes                                 +---------+---------------+---------+-----------+----------+--------------+ FV Prox  Full           Yes      Yes                                 +---------+---------------+---------+-----------+----------+--------------+ FV Mid   Full           Yes      Yes                                 +---------+---------------+---------+-----------+----------+--------------+ FV DistalFull           Yes      Yes                                 +---------+---------------+---------+-----------+----------+--------------+ PFV      Full                                                        +---------+---------------+---------+-----------+----------+--------------+ POP      Full           Yes      Yes                                 +---------+---------------+---------+-----------+----------+--------------+ PTV      Full           Yes      Yes                                  +---------+---------------+---------+-----------+----------+--------------+ PERO     Full           Yes      Yes                                 +---------+---------------+---------+-----------+----------+--------------+ Gastroc  Full                                                        +---------+---------------+---------+-----------+----------+--------------+ GSV  Full           Yes      Yes                                 +---------+---------------+---------+-----------+----------+--------------+   +---------+---------------+---------+-----------+----------+--------------+ LEFT     CompressibilityPhasicitySpontaneityPropertiesThrombus Aging +---------+---------------+---------+-----------+----------+--------------+ CFV      Full           Yes      Yes                                 +---------+---------------+---------+-----------+----------+--------------+ SFJ      Full           Yes      Yes                                 +---------+---------------+---------+-----------+----------+--------------+ FV Prox  Full           Yes      Yes                                 +---------+---------------+---------+-----------+----------+--------------+ FV Mid   Full           Yes      Yes                                 +---------+---------------+---------+-----------+----------+--------------+ FV DistalFull           Yes      Yes                                 +---------+---------------+---------+-----------+----------+--------------+ PFV      Full                                                        +---------+---------------+---------+-----------+----------+--------------+ POP      Full           Yes      Yes                                 +---------+---------------+---------+-----------+----------+--------------+ PTV      Full           Yes      Yes                                  +---------+---------------+---------+-----------+----------+--------------+ PERO     Full           Yes      Yes                                 +---------+---------------+---------+-----------+----------+--------------+ Gastroc  Full                                                        +---------+---------------+---------+-----------+----------+--------------+  GSV      Full           Yes      Yes                                 +---------+---------------+---------+-----------+----------+--------------+    Findings reported to [facility] thru secure chat in EPIC at 11:45 am.  Summary: BILATERAL: - No evidence of deep vein thrombosis seen in the lower extremities, bilaterally. - RIGHT: - No cystic structure found in the popliteal fossa.  LEFT: - No cystic structure found in the popliteal fossa. - Small fluid collection medial to knee measuring 2.2 x 1.4 x .5 cm.  *See table(s) above for measurements and observations. Electronically signed by Lance Muss MD on 05/14/2022 at 9:42:05 PM.    Final     Assessment & Plan:   [name] was seen today for annual exam.  Diagnoses and all orders for this visit:  Primary hypertension- Her blood pressure is well controlled. -     TSH; Future -     TSH  PAF (paroxysmal atrial fibrillation) (HCC)-She has good rate and rhythm control.  Neuromyelopathy due to vitamin B12 deficiency (HCC) -     Vitamin B12; Future -     Folate; Future -     Folate -     Vitamin B12 -     gabapentin (NEURONTIN) 100 MG capsule; Take 1 capsule (100 mg total) by mouth 3 (three) times daily.  Encounter for general adult medical examination with abnormal findings- Exam completed, labs reviewed, vaccines reviewed-she refused a flu vaccine, pneumonia vaccine, shingles vaccine, and tetanus vaccine, no cancer screenings indicated, patient education was given.  Dyslipidemia, goal LDL below 70- LDL goal achieved. Doing well on the statin  -     Lipid panel;  Future -     TSH; Future -     TSH -     Lipid panel  Other osteoporosis without current pathological fracture -     Dietary Management Product (FOSTEUM PLUS) CAPS; Take 1 capsule by mouth 2 (two) times daily.  Neuropathy -     gabapentin (NEURONTIN) 100 MG capsule; Take 1 capsule (100 mg total) by mouth 3 (three) times daily.   I have discontinued Storey Hopf. Krogstad's amitriptyline. I am also having her start on gabapentin. Additionally, I am having her maintain her Calcium Carbonate-Vitamin D (CALCIUM-D PO), acetaminophen, diclofenac sodium, famotidine, cyanocobalamin, traMADol, metoprolol tartrate, sotalol, Livalo, rivaroxaban, chlorthalidone, potassium chloride, diphenoxylate-atropine, and Fosteum Plus.  Meds ordered this encounter  Medications   Dietary Management Product (FOSTEUM PLUS) CAPS    Sig: Take 1 capsule by mouth 2 (two) times daily.    Dispense:  180 capsule    Refill:  1   gabapentin (NEURONTIN) 100 MG capsule    Sig: Take 1 capsule (100 mg total) by mouth 3 (three) times daily.    Dispense:  270 capsule    Refill:  1     Follow-up: Return in about 6 months (around 11/17/2022).  Sanda Linger, MD

## 2022-05-19 NOTE — Patient Instructions (Signed)

## 2022-05-20 ENCOUNTER — Other Ambulatory Visit: Payer: Self-pay | Admitting: Internal Medicine

## 2022-05-20 DIAGNOSIS — L659 Nonscarring hair loss, unspecified: Secondary | ICD-10-CM

## 2022-05-20 LAB — LIPID PANEL
Cholesterol: 185 mg/dL (ref 0–200)
HDL: 49.4 mg/dL (ref >39.00)
LDL Cholesterol: 99 mg/dL (ref 0–99)
NonHDL: 135.53
Total CHOL/HDL Ratio: 4
Triglycerides: 185 mg/dL — ABNORMAL HIGH (ref 0.0–149.0)
VLDL: 37 mg/dL (ref 0.0–40.0)

## 2022-05-20 MED ORDER — FOSTEUM PLUS PO CAPS: 1.0000 | ORAL_CAPSULE | Freq: Two times a day (BID) | ORAL | 1 refills | Status: DC

## 2022-05-20 MED ORDER — GABAPENTIN 100 MG PO CAPS: 100.0000 mg | ORAL_CAPSULE | Freq: Three times a day (TID) | ORAL | 1 refills | Status: DC

## 2022-05-21 ENCOUNTER — Ambulatory Visit (HOSPITAL_COMMUNITY): Payer: Medicare HMO | Attending: Nurse Practitioner

## 2022-05-21 DIAGNOSIS — E785 Hyperlipidemia, unspecified: Secondary | ICD-10-CM | POA: Diagnosis not present

## 2022-05-21 DIAGNOSIS — R5383 Other fatigue: Secondary | ICD-10-CM | POA: Insufficient documentation

## 2022-05-21 DIAGNOSIS — I340 Nonrheumatic mitral (valve) insufficiency: Secondary | ICD-10-CM | POA: Diagnosis not present

## 2022-05-21 DIAGNOSIS — I4891 Unspecified atrial fibrillation: Secondary | ICD-10-CM | POA: Insufficient documentation

## 2022-05-21 DIAGNOSIS — I10 Essential (primary) hypertension: Secondary | ICD-10-CM | POA: Insufficient documentation

## 2022-05-21 LAB — ECHOCARDIOGRAM COMPLETE
Area-P 1/2: 3.08 cm2
S' Lateral: 2.4 cm

## 2022-06-02 ENCOUNTER — Other Ambulatory Visit: Payer: Self-pay | Admitting: Internal Medicine

## 2022-06-02 ENCOUNTER — Other Ambulatory Visit: Payer: Self-pay | Admitting: Cardiovascular Disease

## 2022-06-02 DIAGNOSIS — I10 Essential (primary) hypertension: Secondary | ICD-10-CM

## 2022-06-02 DIAGNOSIS — I480 Paroxysmal atrial fibrillation: Secondary | ICD-10-CM

## 2022-06-06 ENCOUNTER — Ambulatory Visit: Payer: Medicare HMO | Attending: Cardiovascular Disease | Admitting: Physician Assistant

## 2022-06-06 ENCOUNTER — Encounter: Payer: Self-pay | Admitting: Physician Assistant

## 2022-06-06 VITALS — BP 136/78 | HR 72 | Ht 66.0 in | Wt 186.8 lb

## 2022-06-06 DIAGNOSIS — E785 Hyperlipidemia, unspecified: Secondary | ICD-10-CM

## 2022-06-06 DIAGNOSIS — I10 Essential (primary) hypertension: Secondary | ICD-10-CM | POA: Diagnosis not present

## 2022-06-06 DIAGNOSIS — R0609 Other forms of dyspnea: Secondary | ICD-10-CM

## 2022-06-06 DIAGNOSIS — I480 Paroxysmal atrial fibrillation: Secondary | ICD-10-CM

## 2022-06-06 NOTE — Progress Notes (Signed)
Cardiology Office Note:    Date:  06/08/2022   ID:  [name], DOB [birth year], MRN [id number]  PCP:  Holly Lima, MD   [facility] Cardiologist:  Holly Majestic, MD     Referring MD: Holly Lima, MD   Chief Complaint  Patient presents with   Follow-up    Seen for Dr. Claiborne Billings    History of Present Illness:    [name] is a 88 y.o. female with a hx of PAF, hypertension, hyperlipidemia, PAD, fatty liver disease, and a history of ulcerative colitis.  Catheterization in 2016 revealed no significant coronary artery disease with midportion of mid LAD dripping intramyocardial lead without evidence of systolic bridging, smooth 10% narrowing in the AV groove left circumflex artery, normal RCA.  Medical therapy was recommended.  Patient presented in 2018 with palpitation, shortness of breath and presyncope and was diagnosed with A-fib with RVR.  She was started on sotalol and underwent cardioversion.  She had A-fib beginning in 2018 and underwent another cardioversion.  Patient went to the ED in May 2023 was chest tightness, weakness and shortness of breath.  This was related to recurrent onset of A-fib as well, she eventually underwent another successful cardioversion.  She was seen by Holly Presume, PA-C on 01/09/2022 for posthospital follow-up.  She was doing well at the time.  She returned back to the ED on 05/02/2022 with chest tightness.  Troponin negative x2.  EKG showed chronic bronchitic changes.  Patient was given famotidine and Maalox with improvement.  Since she left the emergency room, she was seen by Holly Bud, NP on 05/07/2022.  She complained of fatigue during the office visit.  Echocardiogram shows normal EF, mild MR, otherwise no significant valve issue.  Talking with the patient today, she has been having dyspnea on exertion with more strenuous activity but not with every day activity for the past 3 months.  The degree of dyspnea on exertion has  not changed recently.  She does not have a history of pulmonary issue.  Her lung is clear.  Recent lower extremity venous Doppler was negative for DVT.  At this time, I do not recommend any further work-up and we will continue to observe the shortness of breath.  If it does worsen, may consider coronary CT, however she does not have any chest pain.   Past Medical History:  Diagnosis Date   Abnormal nuclear stress test    mild anterolateral septal and inferior ischemia   Arthritis    Atherosclerosis of lower extremity with claudication (HCC) 04/17/2015   LEFT LEG   Atrial fibrillation (HCC)    Cardiac arrhythmia due to congenital heart disease    Claudication [name])    Colon cancer (HCC)    Coronary atherosclerosis of unspecified type of vessel, native or graft    Esophageal stricture    Fatty liver 11/05/2011   Hiatal hernia    HLD (hyperlipidemia)    Hypertension    IBS (irritable bowel syndrome)    Iron deficiency anemia, unspecified    Ischemic colitis (HCC)    Neuropathy    Osteoporosis    PAF (paroxysmal atrial fibrillation) ([place]) 04/2015   Ulcerative colitis ([place])    Unspecified vascular insufficiency of intestine     Past Surgical History:  Procedure Laterality Date   abdominoperineal resection anastomotic stricture  05/03/2007   APPENDECTOMY     CARDIAC CATHETERIZATION N/A 03/13/2015   Procedure: Left Heart Cath and Coronary Angiography;  Surgeon: Troy Sine, MD;  Location: [place] [facility];  Service: Cardiovascular;  Laterality: N/A;   CARDIOVERSION N/A 04/20/2015   Procedure: CARDIOVERSION;  Surgeon: Jerline Pain, MD;  Location: [place];  Service: Cardiovascular;  Laterality: N/A;   CARDIOVERSION N/A 07/10/2017   Procedure: CARDIOVERSION;  Surgeon: Jerline Pain, MD;  Location: [facility];  Service: Cardiovascular;  Laterality: N/A;   COLON SURGERY     DILATION AND CURETTAGE OF UTERUS  09/01/1990   ESOPHAGOGASTRODUODENOSCOPY N/A 04/27/2014    Procedure: ESOPHAGOGASTRODUODENOSCOPY (EGD);  Surgeon: Lafayette Dragon, MD;  Location: [facility];  Service: Endoscopy;  Laterality: N/A;   Mandibular Renstruction     ROTATOR CUFF REPAIR  09/01/2004   left   Rt. Salpingo oophorectomy and cyst removal  09/02/2003   SAVORY DILATION N/A 04/27/2014   Procedure: SAVORY DILATION;  Surgeon: Lafayette Dragon, MD;  Location: [facility];  Service: Endoscopy;  Laterality: N/A;  no xray needed   sigmoid resection for invasive rectal adenocarcinoma  12/31/2006   TEE WITHOUT CARDIOVERSION N/A 04/20/2015   Procedure: TRANSESOPHAGEAL ECHOCARDIOGRAM (TEE);  Surgeon: Jerline Pain, MD;  Location: [facility];  Service: Cardiovascular;  Laterality: N/A;   TUBAL LIGATION     WRIST SURGERY  09/01/2006   right    Current Medications: Current Meds  Medication Sig   acetaminophen (TYLENOL) 500 MG tablet Take 1,000 mg by mouth every 6 (six) hours as needed for headache (pain).   Calcium Carbonate-Vitamin D (CALCIUM-D PO) Take 1 tablet by mouth at bedtime. Calcium 630 mg, Vitamin D3 500 units   chlorthalidone (HYGROTON) 25 MG tablet TAKE 1 TABLET EVERY DAY   cyanocobalamin 2000 MCG tablet Take 1 tablet (2,000 mcg total) by mouth every other day.   diclofenac sodium (VOLTAREN) 1 % GEL Apply 2 g topically 4 (four) times daily.   Dietary Management Product (FOSTEUM PLUS) CAPS Take 1 capsule by mouth 2 (two) times daily.   diphenoxylate-atropine (LOMOTIL) 2.5-0.025 MG tablet Take 1 tablet by mouth 4 (four) times daily as needed for diarrhea or loose stools.   famotidine (PEPCID) 40 MG tablet TAKE 1 TABLET (40 MG TOTAL) BY MOUTH AT BEDTIME.   finasteride (PROPECIA) 1 MG tablet TAKE 1 TABLET AT BEDTIME   gabapentin (NEURONTIN) 100 MG capsule Take 1 capsule (100 mg total) by mouth 3 (three) times daily.   metoprolol tartrate (LOPRESSOR) 25 MG tablet TAKE 1 AND 1/2 TABLETS IN THE MORNING AND TAKE 1 TABLET AT BEDTIME   Pitavastatin Calcium (LIVALO) 2 MG TABS Take 1  tablet (2 mg total) by mouth daily.   potassium chloride (KLOR-CON M) 10 MEQ tablet TAKE 2 TABLETS DAILY (DOSE INCREASED DUE TO HYPOKALEMIA)   rivaroxaban (XARELTO) 20 MG TABS tablet Take 1 tablet (20 mg total) by mouth daily with supper.   sotalol (BETAPACE) 120 MG tablet TAKE 1 TABLET EVERY 12 HOURS   traMADol (ULTRAM) 50 MG tablet TAKE 1 TABLET BY MOUTH EVERY 6 HOURS AS NEEDED     Allergies:   Crestor [rosuvastatin calcium], Acetaminophen-codeine, Amlodipine besylate, Hydrocodone-acetaminophen, Irbesartan-hydrochlorothiazide, Lisinopril, Omeprazole, Propoxyphene n-acetaminophen, Valsartan, Warfarin sodium, Diltiazem hcl, and Verapamil   Social History   Socioeconomic History   Marital status: Widowed    Spouse name: Not on file   Number of children: 4   Years of education: 12th grade   Highest education level: Not on file  Occupational History   Occupation: retired    Fish farm manager: RETIRED  Tobacco Use   Smoking status: Never  Passive exposure: Never   Smokeless tobacco: Never  Vaping Use   Vaping Use: Never used  Substance and Sexual Activity   Alcohol use: No   Drug use: No   Sexual activity: Not Currently  Other Topics Concern   Not on file  Social History Narrative   Lives at home with fiance.   Right-handed.   No caffeine use.   Social Determinants of Health   Financial Resource Strain: Low Risk  (11/13/2021)   Overall Financial Resource Strain (CARDIA)    Difficulty of Paying Living Expenses: Not hard at all  Food Insecurity: No Food Insecurity (11/13/2021)   Hunger Vital Sign    Worried About Running Out of Food in the Last Year: Never true    Ran Out of Food in the Last Year: Never true  Transportation Needs: No Transportation Needs (11/13/2021)   PRAPARE - Hydrologist (Medical): No    Lack of Transportation (Non-Medical): No  Physical Activity: Inactive (11/13/2021)   Exercise Vital Sign    Days of Exercise per Week: 0 days     Minutes of Exercise per Session: 0 min  Stress: No Stress Concern Present (11/13/2021)   [place]    Feeling of Stress : Not at all  Social Connections: Moderately Integrated (11/13/2021)   Social Connection and Isolation Panel [NHANES]    Frequency of Communication with Friends and Family: More than three times a week    Frequency of Social Gatherings with Friends and Family: More than three times a week    Attends Religious Services: More than 4 times per year    Active Member of [employer] or Organizations: Yes    Attends Archivist Meetings: More than 4 times per year    Marital Status: Widowed     Family History: The patient's family history includes Alzheimer's disease in her mother; Colon cancer (age of onset: 58) in her father; Diabetes in her son and son; Heart disease in her father; Hypertension in her father, sister, son, and son; Mitral valve prolapse in her sister; Thyroid disease in her mother and sister. There is no history of Esophageal cancer, Rectal cancer, or Stomach cancer.  ROS:   Please see the history of present illness.     All other systems reviewed and are negative.  EKGs/Labs/Other Studies Reviewed:    The following studies were reviewed today:  Echo 05/21/2022  1. Left ventricular ejection fraction, by estimation, is 60 to 65%. The  left ventricle has normal function. The left ventricle has no regional  wall motion abnormalities. There is mild left ventricular hypertrophy.  Left ventricular diastolic parameters  were normal.   2. Right ventricular systolic function is normal. The right ventricular  size is normal. There is normal pulmonary artery systolic pressure.   3. The mitral valve is normal in structure. Mild mitral valve  regurgitation. No evidence of mitral stenosis.   4. The aortic valve is normal in structure. Aortic valve regurgitation is  trivial. Aortic valve sclerosis is  present, with no evidence of aortic  valve stenosis.   5. The inferior vena cava is normal in size with greater than 50%  respiratory variability, suggesting right atrial pressure of 3 mmHg.   EKG:  EKG is not ordered today.    Recent Labs: 12/30/2021: Magnesium 1.6 04/09/2022: ALT 14 05/02/2022: BUN 18; Creatinine, Ser 0.93; Hemoglobin 14.9; Platelets 182; Potassium 3.5; Sodium 141 05/19/2022: TSH  1.38  Recent Lipid Panel    Component Value Date/Time   CHOL 185 05/19/2022 1445   CHOL 184 09/14/2019 0944   TRIG 185.0 (H) 05/19/2022 1445   HDL 49.40 05/19/2022 1445   HDL 54 09/14/2019 0944   CHOLHDL 4 05/19/2022 1445   VLDL 37.0 05/19/2022 1445   LDLCALC 99 05/19/2022 1445   LDLCALC 99 09/14/2019 0944   LDLDIRECT 150.0 12/28/2017 1340     Risk Assessment/Calculations:    CHA2DS2-VASc Score = 5   This indicates a 7.2% annual risk of stroke. The patient's score is based upon: CHF History: 0 HTN History: 1 Diabetes History: 0 Stroke History: 0 Vascular Disease History: 1 Age Score: 2 Gender Score: 1          Physical Exam:    VS:  BP 136/78   Pulse 72   Ht '5\' 6"'$  (1.676 m)   Wt 186 lb 12.8 oz (84.7 kg)   SpO2 97%   BMI 30.15 kg/m        Wt Readings from Last 3 Encounters:  06/06/22 186 lb 12.8 oz (84.7 kg)  05/19/22 185 lb (83.9 kg)  05/07/22 185 lb (83.9 kg)     GEN:  Well nourished, well developed in no acute distress HEENT: Normal NECK: No JVD; No carotid bruits LYMPHATICS: No lymphadenopathy CARDIAC: RRR, no murmurs, rubs, gallops RESPIRATORY:  Clear to auscultation without rales, wheezing or rhonchi  ABDOMEN: Soft, non-tender, non-distended MUSCULOSKELETAL:  No edema; No deformity  SKIN: Warm and dry NEUROLOGIC:  Alert and oriented x 3 PSYCHIATRIC:  Normal affect   ASSESSMENT:    1. DOE (dyspnea on exertion)   2. PAF (paroxysmal atrial fibrillation) ([place])   3. Essential hypertension   4. Hyperlipidemia LDL goal <100    PLAN:    In order of  problems listed above:  Dyspnea on exertion: Recent echocardiogram was normal.  We will continue observation for now, if have worsening symptoms, may need to consider coronary CT to rule out anginal equivalent.  Other differential diagnosis would include chronotropic competence.  PAF: No recent recurrence.  On Xarelto and sotalol  Hypertension: Blood pressure stable  Hyperlipidemia: On pitavastatin           Medication Adjustments/Labs and Tests Ordered: Current medicines are reviewed at length with the patient today.  Concerns regarding medicines are outlined above.  No orders of the defined types were placed in this encounter.  No orders of the defined types were placed in this encounter.   Patient Instructions  Medication Instructions:  Your physician recommends that you continue on your current medications as directed. Please refer to the Current Medication list given to you today.  *If you need a refill on your cardiac medications before your next appointment, please call your pharmacy*  Lab Work: NONE ordered at this time of appointment   If you have labs (blood work) drawn today and your tests are completely normal, you will receive your results only by: [place] (if you have MyChart) OR A paper copy in the mail If you have any lab test that is abnormal or we need to change your treatment, we will call you to review the results.  Testing/Procedures: NONE ordered at this time of appointment   Follow-Up: At [facility], you and your health needs are our priority.  As part of our continuing mission to provide you with exceptional heart care, we have created designated Provider Care Teams.  These Care Teams include your primary  Cardiologist (physician) and Advanced Practice Providers (APPs -  Physician Assistants and Nurse Practitioners) who all work together to provide you with the care you need, when you need it.  Your next appointment:   3  month(s)  The format for your next appointment:   In Person  Provider:   Shelva Majestic, MD     Other Instructions Continue to monitor symptoms for dyspnea on exertion   Important Information About Sugar         Hilbert Corrigan, [place]  06/08/2022 11:47 PM    [place]

## 2022-06-06 NOTE — Patient Instructions (Addendum)
Medication Instructions:  Your physician recommends that you continue on your current medications as directed. Please refer to the Current Medication list given to you today.  *If you need a refill on your cardiac medications before your next appointment, please call your pharmacy*  Lab Work: NONE ordered at this time of appointment   If you have labs (blood work) drawn today and your tests are completely normal, you will receive your results only by: [place] (if you have MyChart) OR A paper copy in the mail If you have any lab test that is abnormal or we need to change your treatment, we will call you to review the results.  Testing/Procedures: NONE ordered at this time of appointment   Follow-Up: At [facility], you and your health needs are our priority.  As part of our continuing mission to provide you with exceptional heart care, we have created designated Provider Care Teams.  These Care Teams include your primary Cardiologist (physician) and Advanced Practice Providers (APPs -  Physician Assistants and Nurse Practitioners) who all work together to provide you with the care you need, when you need it.  Your next appointment:   3 month(s)  The format for your next appointment:   In Person  Provider:   Shelva Majestic, MD     Other Instructions Continue to monitor symptoms for dyspnea on exertion   Important Information About Sugar

## 2022-06-08 ENCOUNTER — Encounter: Payer: Self-pay | Admitting: Physician Assistant

## 2022-06-10 ENCOUNTER — Ambulatory Visit: Payer: Medicare HMO | Admitting: Physician Assistant

## 2022-06-19 ENCOUNTER — Other Ambulatory Visit: Payer: Medicare HMO

## 2022-06-19 ENCOUNTER — Encounter: Payer: Self-pay | Admitting: Gastroenterology

## 2022-06-19 ENCOUNTER — Ambulatory Visit: Payer: Medicare HMO | Admitting: Gastroenterology

## 2022-06-19 DIAGNOSIS — R1013 Epigastric pain: Secondary | ICD-10-CM

## 2022-06-19 DIAGNOSIS — K219 Gastro-esophageal reflux disease without esophagitis: Secondary | ICD-10-CM

## 2022-06-19 DIAGNOSIS — K582 Mixed irritable bowel syndrome: Secondary | ICD-10-CM | POA: Diagnosis not present

## 2022-06-19 DIAGNOSIS — R140 Abdominal distension (gaseous): Secondary | ICD-10-CM | POA: Diagnosis not present

## 2022-06-19 MED ORDER — BENEFIBER PO POWD: ORAL | 0 refills | Status: AC

## 2022-06-19 MED ORDER — POLYETHYLENE GLYCOL 3350 17 GM/SCOOP PO POWD
ORAL | 0 refills | Status: AC
Start: 2022-06-19 — End: ?

## 2022-06-19 MED ORDER — IBGARD 90 MG PO CPCR: ORAL_CAPSULE | ORAL | Status: AC

## 2022-06-19 NOTE — Progress Notes (Unsigned)
g

## 2022-06-19 NOTE — Patient Instructions (Addendum)
_______________________________________________________  If you are age 86 or older, your body mass index should be between 23-30. Your Body mass index is 29.92 kg/m. If this is out of the aforementioned range listed, please consider follow up with your Primary Care Provider. ________________________________________________________  The [facility] providers would like to encourage you to use [facility] to communicate with providers for non-urgent requests or questions.  Due to long hold times on the telephone, sending your provider a message by [facility] may be a faster and more efficient way to get a response.  Please allow 48 business hours for a response.  Please remember that this is for non-urgent requests.  _______________________________________________________  Your provider has requested that you go to the basement level for lab work before leaving today. Press "B" on the elevator. The lab is located at the first door on the left as you exit the elevator.  Due to recent changes in healthcare laws, you may see the results of your imaging and laboratory studies on MyChart before your provider has had a chance to review them.  We understand that in some cases there may be results that are confusing or concerning to you. Not all laboratory results come back in the same time frame and the provider may be waiting for multiple results in order to interpret others.  Please give [place] 48 hours in order for your provider to thoroughly review all the results before contacting the office for clarification of your results.   Please purchase the following medications over the counter and take as directed:  START: Ibgard 1 capsule three times daily as needed  START: Benefiber 1 tablespoon 2 to 3 times daily with meals  START: Miralax 1/2 capful daily  You will need a follow up appointment in 3 months (January 2024).  We will contact you to make this appointment.  Thank you for entrusting me with your care  and choosing [facility].  Dr Silverio Decamp

## 2022-06-19 NOTE — Progress Notes (Signed)
[name]    [id number]    [birth year]  Primary Care Physician:Jones, Arvid Right, MD  Referring Physician: Janith Lima, MD [address] [place],   [address]   Chief complaint: IBS  HPI:  88 year old female with history of chronic GERD, hernia, esophageal stricture status post dilation, sigmoid cancer status post sigmoid resection and anastomotic stricture  She continues to have intermittent diarrhea alternating with constipation.  She is also experiencing dyspepsia and indigestion, is worried what she can eat as many foods tear of her stomach   Denies any nausea, vomiting, abdominal pain, melena or bright red blood per rectum     Colonoscopy October 2012 showed minimal narrowing at sigmoid anastomosis at 20 cm.  No recall due to age. EGD July 19, 2014 distal esophageal stricture dilated from 14 to 18 mm prior to that EGD April 2014     Outpatient Encounter Medications as of 06/19/2022  Medication Sig   acetaminophen (TYLENOL) 500 MG tablet Take 1,000 mg by mouth every 6 (six) hours as needed for headache (pain).   Calcium Carbonate-Vitamin D (CALCIUM-D PO) Take 1 tablet by mouth at bedtime. Calcium 630 mg, Vitamin D3 500 units   chlorthalidone (HYGROTON) 25 MG tablet TAKE 1 TABLET EVERY DAY   cyanocobalamin 2000 MCG tablet Take 1 tablet (2,000 mcg total) by mouth every other day.   diclofenac sodium (VOLTAREN) 1 % GEL Apply 2 g topically 4 (four) times daily.   Dietary Management Product (FOSTEUM PLUS) CAPS Take 1 capsule by mouth 2 (two) times daily.   diphenoxylate-atropine (LOMOTIL) 2.5-0.025 MG tablet Take 1 tablet by mouth 4 (four) times daily as needed for diarrhea or loose stools.   famotidine (PEPCID) 40 MG tablet TAKE 1 TABLET (40 MG TOTAL) BY MOUTH AT BEDTIME.   finasteride (PROPECIA) 1 MG tablet TAKE 1 TABLET AT BEDTIME   gabapentin (NEURONTIN) 100 MG capsule Take 1 capsule (100 mg total) by mouth 3 (three) times daily.    metoprolol tartrate (LOPRESSOR) 25 MG tablet TAKE 1 AND 1/2 TABLETS IN THE MORNING AND TAKE 1 TABLET AT BEDTIME   Pitavastatin Calcium (LIVALO) 2 MG TABS Take 1 tablet (2 mg total) by mouth daily.   potassium chloride (KLOR-CON M) 10 MEQ tablet TAKE 2 TABLETS DAILY (DOSE INCREASED DUE TO HYPOKALEMIA)   rivaroxaban (XARELTO) 20 MG TABS tablet Take 1 tablet (20 mg total) by mouth daily with supper.   sotalol (BETAPACE) 120 MG tablet TAKE 1 TABLET EVERY 12 HOURS   traMADol (ULTRAM) 50 MG tablet TAKE 1 TABLET BY MOUTH EVERY 6 HOURS AS NEEDED   No facility-administered encounter medications on file as of 06/19/2022.    Allergies as of 06/19/2022 - Review Complete 06/19/2022  Allergen Reaction Noted   Crestor [rosuvastatin calcium] Other (See Comments) 12/25/2016   Acetaminophen-codeine Other (See Comments)    Amlodipine besylate Swelling 11/09/2008   Hydrocodone-acetaminophen Nausea And Vomiting    Irbesartan-hydrochlorothiazide Other (See Comments) 11/09/2008   Lisinopril Cough    Omeprazole Nausea Only and Other (See Comments) 11/15/2014   Propoxyphene n-acetaminophen Other (See Comments)    Valsartan Other (See Comments) 11/09/2008   Warfarin sodium Other (See Comments) 11/09/2008   Diltiazem hcl Rash 11/09/2008   Verapamil Nausea Only, Palpitations, and Other (See Comments) 06/02/2011    Past Medical History:  Diagnosis Date   Abnormal nuclear stress test    mild anterolateral septal  and inferior ischemia   Arthritis    Atherosclerosis of lower extremity with claudication ([place]) 04/17/2015   LEFT LEG   Atrial fibrillation (HCC)    Cardiac arrhythmia due to congenital heart disease    Claudication [facility])    Colon cancer (HCC)    Coronary atherosclerosis of unspecified type of vessel, native or graft    Esophageal stricture    Fatty liver 11/05/2011   Hiatal hernia    HLD (hyperlipidemia)    Hypertension    IBS (irritable bowel syndrome)    Iron deficiency anemia, unspecified     Ischemic colitis (HCC)    Neuropathy    Osteoporosis    PAF (paroxysmal atrial fibrillation) ([place]) 04/2015   Ulcerative colitis ([place])    Unspecified vascular insufficiency of intestine     Past Surgical History:  Procedure Laterality Date   abdominoperineal resection anastomotic stricture  05/03/2007   APPENDECTOMY     CARDIAC CATHETERIZATION N/A 03/13/2015   Procedure: Left Heart Cath and Coronary Angiography;  Surgeon: Troy Sine, MD;  Location: [place] [facility];  Service: Cardiovascular;  Laterality: N/A;   CARDIOVERSION N/A 04/20/2015   Procedure: CARDIOVERSION;  Surgeon: Jerline Pain, MD;  Location: [place];  Service: Cardiovascular;  Laterality: N/A;   CARDIOVERSION N/A 07/10/2017   Procedure: CARDIOVERSION;  Surgeon: Jerline Pain, MD;  Location: [place] [facility];  Service: Cardiovascular;  Laterality: N/A;   COLON SURGERY     DILATION AND CURETTAGE OF UTERUS  09/01/1990   ESOPHAGOGASTRODUODENOSCOPY N/A 04/27/2014   Procedure: ESOPHAGOGASTRODUODENOSCOPY (EGD);  Surgeon: Lafayette Dragon, MD;  Location: [facility];  Service: Endoscopy;  Laterality: N/A;   Mandibular Renstruction     ROTATOR CUFF REPAIR  09/01/2004   left   Rt. Salpingo oophorectomy and cyst removal  09/02/2003   SAVORY DILATION N/A 04/27/2014   Procedure: SAVORY DILATION;  Surgeon: Lafayette Dragon, MD;  Location: [facility];  Service: Endoscopy;  Laterality: N/A;  no xray needed   sigmoid resection for invasive rectal adenocarcinoma  12/31/2006   TEE WITHOUT CARDIOVERSION N/A 04/20/2015   Procedure: TRANSESOPHAGEAL ECHOCARDIOGRAM (TEE);  Surgeon: Jerline Pain, MD;  Location: [facility] ENDOSCOPY;  Service: Cardiovascular;  Laterality: N/A;   TUBAL LIGATION     WRIST SURGERY  09/01/2006   right    Family History  Problem Relation Age of Onset   Colon cancer Father 68   Heart disease Father    Hypertension Father    Alzheimer's disease Mother    Thyroid disease Mother    Hypertension Sister     Thyroid disease Sister    Mitral valve prolapse Sister    Hypertension Son    Diabetes Son    Hypertension Son    Diabetes Son    Esophageal cancer Neg Hx    Rectal cancer Neg Hx    Stomach cancer Neg Hx     Social History   Socioeconomic History   Marital status: Widowed    Spouse name: Not on file   Number of children: 4   Years of education: 12th grade   Highest education level: Not on file  Occupational History   Occupation: retired    Fish farm manager: RETIRED  Tobacco Use   Smoking status: Never    Passive exposure: Never   Smokeless tobacco: Never  Vaping Use   Vaping Use: Never used  Substance and Sexual Activity   Alcohol use: No   Drug use: No   Sexual activity: Not Currently  Other  Topics Concern   Not on file  Social History Narrative   Lives at home with fiance.   Right-handed.   No caffeine use.   Social Determinants of Health   Financial Resource Strain: Low Risk  (11/13/2021)   Overall Financial Resource Strain (CARDIA)    Difficulty of Paying Living Expenses: Not hard at all  Food Insecurity: No Food Insecurity (11/13/2021)   Hunger Vital Sign    Worried About Running Out of Food in the Last Year: Never true    Ran Out of Food in the Last Year: Never true  Transportation Needs: No Transportation Needs (11/13/2021)   PRAPARE - Hydrologist (Medical): No    Lack of Transportation (Non-Medical): No  Physical Activity: Inactive (11/13/2021)   Exercise Vital Sign    Days of Exercise per Week: 0 days    Minutes of Exercise per Session: 0 min  Stress: No Stress Concern Present (11/13/2021)   [place]    Feeling of Stress : Not at all  Social Connections: Moderately Integrated (11/13/2021)   Social Connection and Isolation Panel [NHANES]    Frequency of Communication with Friends and Family: More than three times a week    Frequency of Social Gatherings with Friends  and Family: More than three times a week    Attends Religious Services: More than 4 times per year    Active Member of [employer] or Organizations: Yes    Attends Archivist Meetings: More than 4 times per year    Marital Status: Widowed  Intimate Partner Violence: Not At Risk (11/13/2021)   Humiliation, Afraid, Rape, and Kick questionnaire    Fear of Current or Ex-Partner: No    Emotionally Abused: No    Physically Abused: No    Sexually Abused: No      Review of systems: All other review of systems negative except as mentioned in the HPI.   Physical Exam: Vitals:   06/19/22 0958  BP: (Abnormal) 140/80  Pulse: (Abnormal) 59  SpO2: 96%   Body mass index is 29.92 kg/m. Gen:      No acute distress HEENT:  sclera anicteric Abd:      soft, non-tender; no palpable masses, no distension Ext:    No edema Neuro: alert and oriented x 3 Psych: normal mood and affect  Data Reviewed:  Reviewed labs, radiology imaging, old records and pertinent past GI work up   Assessment and Plan/Recommendations:  88 year old female with history of chronic GERD, esophageal stricture status post dilation to 18 mm, sigmoid colon cancer in 2008 status post resection with anastomotic stricture here for follow-up visit for GERD and IBS  GERD: Continue famotidine Discussed antireflux measures Use FD Gard 1 capsule up to 3 times daily for dyspepsia symptoms   Irritable bowel syndrome with constipation and diarrhea Use Benefiber 1 tablespoon 3 times daily with meals Use MiraLAX half capful daily and titrate as needed Continue with lactose-free diet and dietary changes Continue IBgard 1 capsule up to 3 times daily as needed Check fecal fat and elastase to exclude pancreatic insufficiency   Return 3 months  This visit required >30 minutes of patient care (this includes precharting, chart review, review of results, face-to-face time used for counseling as well as treatment plan and follow-up.  The patient was provided an opportunity to ask questions and all were answered. The patient agreed with the plan and demonstrated an understanding of the  instructions.  Damaris Hippo , MD    CC: Janith Lima, MD

## 2022-06-24 ENCOUNTER — Other Ambulatory Visit: Payer: Medicare HMO

## 2022-06-24 DIAGNOSIS — R140 Abdominal distension (gaseous): Secondary | ICD-10-CM | POA: Diagnosis not present

## 2022-06-24 DIAGNOSIS — R1013 Epigastric pain: Secondary | ICD-10-CM

## 2022-06-24 DIAGNOSIS — K582 Mixed irritable bowel syndrome: Secondary | ICD-10-CM | POA: Diagnosis not present

## 2022-06-24 DIAGNOSIS — K219 Gastro-esophageal reflux disease without esophagitis: Secondary | ICD-10-CM

## 2022-06-26 LAB — FECAL FAT, QUALITATIVE
Fat Qual Neutral, Stl: NORMAL
Fat Qual Total, Stl: NORMAL

## 2022-06-30 LAB — PANCREATIC ELASTASE, FECAL: Pancreatic Elastase-1, Stool: >500 mcg/g

## 2022-07-15 ENCOUNTER — Other Ambulatory Visit: Payer: Self-pay | Admitting: Cardiovascular Disease

## 2022-07-15 DIAGNOSIS — I480 Paroxysmal atrial fibrillation: Secondary | ICD-10-CM

## 2022-07-18 ENCOUNTER — Encounter: Payer: Self-pay | Admitting: Gastroenterology

## 2022-09-13 ENCOUNTER — Other Ambulatory Visit: Payer: Self-pay | Admitting: Cardiovascular Disease

## 2022-09-13 ENCOUNTER — Other Ambulatory Visit: Payer: Self-pay | Admitting: Internal Medicine

## 2022-09-13 DIAGNOSIS — I480 Paroxysmal atrial fibrillation: Secondary | ICD-10-CM

## 2022-09-13 MED ORDER — RIVAROXABAN 20 MG PO TABS: 20.0000 mg | ORAL_TABLET | Freq: Every day | ORAL | 0 refills | Status: DC

## 2022-09-15 MED ORDER — POTASSIUM CHLORIDE CRYS ER 10 MEQ PO TBCR: EXTENDED_RELEASE_TABLET | ORAL | 3 refills | Status: DC

## 2022-09-18 DIAGNOSIS — H538 Other visual disturbances: Secondary | ICD-10-CM | POA: Diagnosis not present

## 2022-09-18 DIAGNOSIS — Z961 Presence of intraocular lens: Secondary | ICD-10-CM | POA: Diagnosis not present

## 2022-10-20 ENCOUNTER — Telehealth: Payer: Self-pay | Admitting: Gastroenterology

## 2022-10-20 DIAGNOSIS — K219 Gastro-esophageal reflux disease without esophagitis: Secondary | ICD-10-CM

## 2022-10-20 MED ORDER — FAMOTIDINE 40 MG PO TABS: 40.0000 mg | ORAL_TABLET | Freq: Every day | ORAL | 1 refills | Status: DC

## 2022-10-20 NOTE — Telephone Encounter (Signed)
The pt has been advised that her prescription has been sent to the pharmacy

## 2022-10-20 NOTE — Telephone Encounter (Signed)
Patient is calling states she is needing another Rx of Famotidine. Please advise

## 2022-10-23 ENCOUNTER — Encounter: Payer: Self-pay | Admitting: Cardiovascular Disease

## 2022-10-23 ENCOUNTER — Ambulatory Visit: Payer: Medicare HMO | Attending: Cardiovascular Disease | Admitting: Cardiovascular Disease

## 2022-10-23 DIAGNOSIS — I480 Paroxysmal atrial fibrillation: Secondary | ICD-10-CM | POA: Diagnosis not present

## 2022-10-23 DIAGNOSIS — I739 Peripheral vascular disease, unspecified: Secondary | ICD-10-CM

## 2022-10-23 DIAGNOSIS — I10 Essential (primary) hypertension: Secondary | ICD-10-CM | POA: Diagnosis not present

## 2022-10-23 DIAGNOSIS — Z7901 Long term (current) use of anticoagulants: Secondary | ICD-10-CM | POA: Diagnosis not present

## 2022-10-23 DIAGNOSIS — K5010 Crohn's disease of large intestine without complications: Secondary | ICD-10-CM | POA: Diagnosis not present

## 2022-10-23 MED ORDER — RIVAROXABAN 20 MG PO TABS: 20.0000 mg | ORAL_TABLET | Freq: Every day | ORAL | 0 refills | Status: DC

## 2022-10-23 NOTE — Patient Instructions (Addendum)
Medication Instructions:  No changes   *If you need a refill on your cardiac medications before your next appointment, please call your pharmacy*   Lab Work: Not needed   Testing/Procedures: Not needed   Follow-Up: At [facility], you and your health needs are our priority.  As part of our continuing mission to provide you with exceptional heart care, we have created designated Provider Care Teams.  These Care Teams include your primary Cardiologist (physician) and Advanced Practice Providers (APPs -  Physician Assistants and Nurse Practitioners) who all work together to provide you with the care you need, when you need it.     Your next appointment:   12 month(s)  The format for your next appointment:   In Person  Provider:   Shelva Majestic, MD

## 2022-10-23 NOTE — Progress Notes (Signed)
Patient ID: [name], female   DOB: [birth year], 89 y.o.   MRN: [id number]      PCP: Dr. Eilleen Kempf  HPI: [name] is a 89 y.o. female who presents for a 81 month cardiology evaluation.  [name] has a history of paroxysmal atrial fibrillation, hypertension, as well as hyperlipidemia. Remotely, she developed myalgias with simvastatin and had not been willing to try additional lipid lowering therapy. In August 2012 an echo Doppler study showed mild asymmetric LVH with proximal septal thickening without LVOT obstruction. She had grade 1 diastolic dysfunction with normal systolic function, mild MR, mild TR, and aortic valve sclerosis with mild MR.  In December 2014 follow-up blood work showed normal renal function with a BUN of 19, creatinine 0.8.  She continued to have significant hyperlipidemia with a total cholesterol of 250, triglycerides 221 HDL 53, VLDL 44 and LDL cholesterol 153.  TSH was normal at 2.275.   [name]  had noticed development of claudication, particularly involving her left leg with activity.  She had episodes of shortness of breath and fatigue.  She  underwent a lower extremity arterial Doppler study which was abnormal and showed an ABI of 0.63 on the left and 1.0 on the right.  The left common iliac, external iliac, common femoral artery and profunda demonstrated multiphasic flow.  However, the superficial femoral artery on the left demonstrated occlusive disease in the proximal to mid thigh with reconstitution of flow in the mid distal segment.  The popliteal artery demonstrated patent monophasic flow with three-vessel runoff.  The anterior tibial artery demonstrated focal stenosis in the proximal calf with dampened flow distal to this.  She presented to [facility] hospital in August 2016 with complaints of palpitations, shortness of breath and presyncope.  She was noted to be in atrial fibrillation with rapid ventricular response.  Her heart rate was initially attempted  to be controlled with metoprolol and digitoxin, which was not successful and ultimately sotalol was started.  On 04/20/2015 she underwent successful TEE guided cardioversion.  She was continued on metoprolol as well as eliquis for anticoagulation.  An echo Doppler study on 04/18/2015 showed an EF of 55-60% with mild LVH.  She has multiple allergies and in the past did not tolerate ACE-inhibition or ARB therapy and was able to tolerate direct renin inhibition.  She has a history of GERD and has been taking ranitidine and sucralfate.  She has a history of hyperlipidemia and has had issues with statins.  When I saw her in October 2017 she was maintaining sinus rhythm.  She  presented to the emergency room on 01/15/2017 with chest tightness, shortness of breath and palpitations.  She was found to be back in atrial fibrillation.  She has continued to take anticoagulation with eloquence 5 mg twice a day.  During that evaluation, she underwent successful cardioversion and sinus rhythm was restored with 200 J cardioversion.   When I saw her in May 2018, she was maintaining sinus rhythm.  I discussed the possibility of sleep apnea in the etiology of her recurrent AF but she did not want to pursue a sleep evaluation.  She underwent a follow-up echo Doppler study on 02/06/2017 which showed an EF of 60-65%.  There was moderate LVH, grade 2 diastolic dysfunction, and she was without major valvular abnormalities.  She developed recurrent atrial fibrillation with mild chest pain.  She failed 2 attempts to cardioversion in the emergency room at 200 J.  She ultimately was seen  by Dr. Rayann Heman in consultation and although her QTc was mildly prolonged, he felt that it was stable.  As result sotalol was increased to 120 mg twice a day.  2 days later on 07/10/2017 she underwent successful cardioversion on the increased dose.  She has a maintaining normal rhythm since.  She hadn't atrial fibrillation clinic follow-up on 07/17/2017  and her ECG showed sinus rhythm at 60 bpm.  She has continued to be on sotalol 120 twice a day, metoprolol 25 mg twice a day, and eliquis 5 mg twice a day for cha2ds2score of at least 5.  Of note, upon further questioning, she has had issues recently where food gets stuck in her esophagus.  Remotely she had undergone esophageal dilatations in 2015.   When I saw her in June 2019  she denied any awareness of recurrent atrial fibrillation.  She has peripheral neuropathy of her feet.  She had undergone  laboratory by her primary physician which showed a cholesterol 229, triglycerides 340, VLDL 68, and direct LDL 150.  He had issues with numerous statins and during that evaluation I recommended a trial of Livalo and provided her with samples of 1 mg daily for 1 month with plans to titrate to 2 mg..  She subsequently was seen by [name] as well as [name] pharmacist and there was some discussion concerning PCSK9 inhibition.  However due to cost issues she preferred to continue to try the Livalo.  She has tolerated this well with improvement in her LVH but unfortunately the cost has been significant.  I saw her in June 2020.  At that time she was taking Livalo 2 mg daily and lipid studies were significantly improved with total cholesterol 168, LDL 72, HDL 54.    I saw her in January 2021 and since her prior evaluation she had remained stable from a cardiac standpoint.  She was unaware of any recurrent episodes of atrial fibrillation. She presented to the emergency room in August 2020 with right shoulder pain.  She had previously experienced chronic issues with the pain had exacerbated.  She was found to have several tears.  There was some discussion concerning scheduling her for right shoulder surgery with Dr. Griffin Basil.  However over the past several months, her shoulder discomfort has improved and surgery is therefore not scheduled.  When she presented to the emergency room, her blood pressure was  significantly elevated at 198/83.  She had seen her primary physician and November 2020 and at that time her blood pressure was elevated at 158/86.  At times she does note some mild ankle swelling.   I saw her on May 25, 2020 and since her prior evaluation she continued to do well.  She denied any chest pain and was unaware of any recurrent atrial fibrillation.  She continued to be on Xarelto but now that she is in the donut hole the cost is very expensive.  She has been on finasteride to potentially help with some mild hair loss.  She is on metoprolol tartrate 37.5 mg in the morning and 25 mg at night in addition to sotalol 120 mg twice a day and chlorthalidone 25 mg daily.  She has neuropathy on gabapentin.    I last saw her on July 10, 2021. She was unaware of any recurrent atrial fibrillation.  She admits to increased stress at the household.  Her husband has stage IV lung CA with increasing dementia.  She has noticed some mild swelling in her right greater  than left ankle.  Her blood pressure has remained stable.  She had undergone laboratory in September 2022 hemoglobin hematocrit were stable.  LFTs were normal.  LDL cholesterol was 95 with triglycerides 159 and total cholesterol 180.  TSH was normal at 1.39.  Renal function was stable with creatinine of 0.85.    Since I last saw her, her husband unfortunately died on Oct 04, 2021.  She was evaluated in the emergency room in May 2023 with chest tightness weakness and shortness of breath in the setting of recurrent atrial fibrillation.  She underwent successful cardioversion.  She was seen by [name], PA-C on Jan 09, 2022 for posthospital follow-up and was doing well.  She returned to the emergency room on July 02, 2022 with mild chest tightness.  Troponins were negative.  Chest x-ray showed bronchitic changes.  A subsequent echocardiogram on May 21, 2022 showed an EF of 60 to 65% with mild LVH.  She had normal pulmonary  artery systolic pressure.  There was mild MR.  There was mild aortic sclerosis without stenosis.  She subsequently was she  evaluated by Almyra Deforest, PA on June 06, 2022 and remained stable.  She was on Xarelto and sotalol without recurrent AF.  Presently, Ms. Tolsma feels well.  She sees Dr. [name] for primary care.  She denies any chest pain.  She is unaware of recurrent atrial fibrillation.  She continues to be on chlorthalidone 25 mg, metoprolol tartrate 37.5 mg twice daily, and sotalol 120 mg twice a day for blood pressure and PAF.  She continues to be on Livalo 2 mg daily for hyperlipidemia.  She takes gabapentin as needed for nerve pain.  She presents for evaluation.   Past Medical History:  Diagnosis Date   Abnormal nuclear stress test    mild anterolateral septal and inferior ischemia   Arthritis    Atherosclerosis of lower extremity with claudication (HCC) 04/17/2015   LEFT LEG   Atrial fibrillation (HCC)    Cardiac arrhythmia due to congenital heart disease    Claudication [facility])    Colon cancer (HCC)    Coronary atherosclerosis of unspecified type of vessel, native or graft    Esophageal stricture    Fatty liver 11/05/2011   Hiatal hernia    HLD (hyperlipidemia)    Hypertension    IBS (irritable bowel syndrome)    Iron deficiency anemia, unspecified    Ischemic colitis (HCC)    Neuropathy    Osteoporosis    PAF (paroxysmal atrial fibrillation) ([place]) 04/2015   Ulcerative colitis ([place])    Unspecified vascular insufficiency of intestine     Past Surgical History:  Procedure Laterality Date   abdominoperineal resection anastomotic stricture  05/03/2007   APPENDECTOMY     CARDIAC CATHETERIZATION N/A 03/13/2015   Procedure: Left Heart Cath and Coronary Angiography;  Surgeon: Troy Sine, MD;  Location: [place] [facility];  Service: Cardiovascular;  Laterality: N/A;   CARDIOVERSION N/A 04/20/2015   Procedure: CARDIOVERSION;  Surgeon: Jerline Pain, MD;  Location: [place];  Service: Cardiovascular;  Laterality: N/A;   CARDIOVERSION N/A 07/10/2017   Procedure: CARDIOVERSION;  Surgeon: Jerline Pain, MD;  Location: [facility];  Service: Cardiovascular;  Laterality: N/A;   COLON SURGERY     DILATION AND CURETTAGE OF UTERUS  09/01/1990   ESOPHAGOGASTRODUODENOSCOPY N/A 04/27/2014   Procedure: ESOPHAGOGASTRODUODENOSCOPY (EGD);  Surgeon: Lafayette Dragon, MD;  Location: [facility];  Service: Endoscopy;  Laterality: N/A;   Mandibular  Renstruction     ROTATOR CUFF REPAIR  09/01/2004   left   Rt. Salpingo oophorectomy and cyst removal  09/02/2003   SAVORY DILATION N/A 04/27/2014   Procedure: SAVORY DILATION;  Surgeon: Lafayette Dragon, MD;  Location: [facility];  Service: Endoscopy;  Laterality: N/A;  no xray needed   sigmoid resection for invasive rectal adenocarcinoma  12/31/2006   TEE WITHOUT CARDIOVERSION N/A 04/20/2015   Procedure: TRANSESOPHAGEAL ECHOCARDIOGRAM (TEE);  Surgeon: Jerline Pain, MD;  Location: [facility];  Service: Cardiovascular;  Laterality: N/A;   TUBAL LIGATION     WRIST SURGERY  09/01/2006   right    Allergies  Allergen Reactions   Crestor [Rosuvastatin Calcium] Other (See Comments)    Muscle aches   Acetaminophen-Codeine Other (See Comments)    Unknown reaction - possibly nausea   Amlodipine Besylate Swelling    Hands and feet swelling   Hydrocodone-Acetaminophen Nausea And Vomiting   Irbesartan-Hydrochlorothiazide Other (See Comments)    REACTION: Dizziness   Lisinopril Cough   Omeprazole Nausea Only and Other (See Comments)    Dizziness, joint pain, weakness in legs, cramps in legs, stomach burned   Propoxyphene N-Acetaminophen Other (See Comments)    Headache, and blotchy face   Valsartan Other (See Comments)    REACTION: blurred vision   Warfarin Sodium Other (See Comments)    REACTION: body aches and bleeding   Diltiazem Hcl Rash   Verapamil Nausea Only, Palpitations and Other (See Comments)    Headaches      Current Outpatient Medications  Medication Sig Dispense Refill   acetaminophen (TYLENOL) 500 MG tablet Take 1,000 mg by mouth every 6 (six) hours as needed for headache (pain).     Calcium Carbonate-Vitamin D (CALCIUM-D PO) Take 1 tablet by mouth at bedtime. Calcium 630 mg, Vitamin D3 500 units     chlorthalidone (HYGROTON) 25 MG tablet TAKE 1 TABLET EVERY DAY 90 tablet 0   cyanocobalamin 2000 MCG tablet Take 1 tablet (2,000 mcg total) by mouth every other day. (Patient taking differently: Take 2,000 mcg by mouth daily.) 45 tablet 1   Dietary Management Product (FOSTEUM PLUS) CAPS Take 1 capsule by mouth 2 (two) times daily. 180 capsule 1   famotidine (PEPCID) 40 MG tablet Take 1 tablet (40 mg total) by mouth at bedtime. 90 tablet 1   finasteride (PROPECIA) 1 MG tablet TAKE 1 TABLET AT BEDTIME 90 tablet 1   gabapentin (NEURONTIN) 100 MG capsule Take 1 capsule (100 mg total) by mouth 3 (three) times daily. 270 capsule 1   metoprolol tartrate (LOPRESSOR) 25 MG tablet TAKE 1 AND 1/2 TABLETS IN THE MORNING AND TAKE 1 TABLET AT BEDTIME 225 tablet 0   Peppermint Oil (IBGARD) 90 MG CPCR 1 capsule three times daily as needed     Pitavastatin Calcium (LIVALO) 2 MG TABS Take 1 tablet (2 mg total) by mouth daily. 90 tablet 1   polyethylene glycol powder (MIRALAX) 17 GM/SCOOP powder 1/2 capful daily. 255 g 0   potassium chloride (KLOR-CON M) 10 MEQ tablet TAKE 2 TABLETS DAILY (DOSE INCREASED DUE TO HYPOKALEMIA) 180 tablet 3   sotalol (BETAPACE) 120 MG tablet TAKE 1 TABLET (120 MG TOTAL) BY MOUTH EVERY 12 (TWELVE) HOURS. 180 tablet 3   Wheat Dextrin (BENEFIBER) POWD 1 tablespoon 2 to 3 times daily with meals (Patient taking differently: Take by mouth daily in the afternoon. 1 tablespoon 2 to 3 times daily with meals) 30 g 0   diclofenac sodium (VOLTAREN)  1 % GEL Apply 2 g topically 4 (four) times daily. (Patient not taking: Reported on 10/23/2022) 100 g 1   diphenoxylate-atropine (LOMOTIL) 2.5-0.025 MG  tablet Take 1 tablet by mouth 4 (four) times daily as needed for diarrhea or loose stools. (Patient not taking: Reported on 10/23/2022) 30 tablet 0   rivaroxaban (XARELTO) 20 MG TABS tablet Take 1 tablet (20 mg total) by mouth daily with supper. 14 tablet 0   traMADol (ULTRAM) 50 MG tablet TAKE 1 TABLET BY MOUTH EVERY 6 HOURS AS NEEDED (Patient not taking: Reported on 10/23/2022) 90 tablet 5   No current facility-administered medications for this visit.    Social History   Socioeconomic History   Marital status: Widowed    Spouse name: Not on file   Number of children: 4   Years of education: 12th grade   Highest education level: Not on file  Occupational History   Occupation: retired    Fish farm manager: RETIRED  Tobacco Use   Smoking status: Never    Passive exposure: Never   Smokeless tobacco: Never  Vaping Use   Vaping Use: Never used  Substance and Sexual Activity   Alcohol use: No   Drug use: No   Sexual activity: Not Currently  Other Topics Concern   Not on file  Social History Narrative   Lives at home with fiance.   Right-handed.   No caffeine use.   Social Determinants of Health   Financial Resource Strain: Low Risk  (11/13/2021)   Overall Financial Resource Strain (CARDIA)    Difficulty of Paying Living Expenses: Not hard at all  Food Insecurity: No Food Insecurity (11/13/2021)   Hunger Vital Sign    Worried About Running Out of Food in the Last Year: Never true    Ran Out of Food in the Last Year: Never true  Transportation Needs: No Transportation Needs (11/13/2021)   PRAPARE - Hydrologist (Medical): No    Lack of Transportation (Non-Medical): No  Physical Activity: Inactive (11/13/2021)   Exercise Vital Sign    Days of Exercise per Week: 0 days    Minutes of Exercise per Session: 0 min  Stress: No Stress Concern Present (11/13/2021)   [place]    Feeling of Stress :  Not at all  Social Connections: Moderately Integrated (11/13/2021)   Social Connection and Isolation Panel [NHANES]    Frequency of Communication with Friends and Family: More than three times a week    Frequency of Social Gatherings with Friends and Family: More than three times a week    Attends Religious Services: More than 4 times per year    Active Member of [employer] or Organizations: Yes    Attends Archivist Meetings: More than 4 times per year    Marital Status: Widowed  Intimate Partner Violence: Not At Risk (11/13/2021)   Humiliation, Afraid, Rape, and Kick questionnaire    Fear of Current or Ex-Partner: No    Emotionally Abused: No    Physically Abused: No    Sexually Abused: No    Family History  Problem Relation Age of Onset   Colon cancer Father 50   Heart disease Father    Hypertension Father    Alzheimer's disease Mother    Thyroid disease Mother    Hypertension Sister    Thyroid disease Sister    Mitral valve prolapse Sister    Hypertension Son  Diabetes Son    Hypertension Son    Diabetes Son    Esophageal cancer Neg Hx    Rectal cancer Neg Hx    Stomach cancer Neg Hx    Social history  is notable that she is widowed. She has 3 children and 9 grandchildren. She remains active. There is no alcohol tobacco use.   ROS General: Negative; No fevers, chills, or night sweats; positive for fatigue and shortness of breath HEENT: Negative; No changes in vision or hearing, sinus congestion, difficulty swallowing Pulmonary: Negative; No cough, wheezing, hemoptysis Cardiovascular: Positive for PAF, status post cardioversion Positive for left leg claudication GI: Positive for GERD; dysphagia GU: Negative; No dysuria, hematuria, or difficulty voiding Musculoskeletal: Right shoulder muscle tears Hematologic/Oncology: Negative; no easy bruising, bleeding Endocrine: Negative; no heat/cold intolerance; no diabetes Neuro: Positive for peripheral neuropathy Skin:  Negative; No rashes or skin lesions Psychiatric: Negative; No behavioral problems, depression Sleep: Negative; No snoring, daytime sleepiness, hypersomnolence, bruxism, restless legs, hypnogognic hallucinations, no cataplexy Other comprehensive 14 point system review is negative.   PE BP (!) 162/64 (BP Location: Left Arm, Patient Position: Sitting, Cuff Size: Large)   Pulse (!) 58   Ht '5\' 6"'$  (1.676 m)   Wt 194 lb 3.2 oz (88.1 kg)   SpO2 96%   BMI 31.34 kg/m    Repeat BP by me: 116/70  Wt Readings from Last 3 Encounters:  10/23/22 194 lb 3.2 oz (88.1 kg)  06/19/22 185 lb 6 oz (84.1 kg)  06/06/22 186 lb 12.8 oz (84.7 kg)   General: Alert, oriented, no distress.  Skin: normal turgor, no rashes, warm and dry HEENT: Normocephalic, atraumatic. Pupils equal round and reactive to light; sclera anicteric; extraocular muscles intact; 3 Nose without nasal septal hypertrophy Mouth/Parynx benign; Mallinpatti scale Neck: No JVD, no carotid bruits; normal carotid upstroke Lungs: clear to ausculatation and percussion; no wheezing or rales Chest wall: without tenderness to palpitation Heart: PMI not displaced, RRR, s1 s2 normal, 1/6 systolic murmur, no diastolic murmur, no rubs, gallops, thrills, or heaves Abdomen: soft, nontender; no hepatosplenomehaly, BS+; abdominal aorta nontender and not dilated by palpation. Back: no CVA tenderness Pulses 2+ Musculoskeletal: full range of motion, normal strength, no joint deformities Extremities: no clubbing cyanosis or edema, Homan's sign negative  Neurologic: grossly nonfocal; Cranial nerves grossly wnl Psychologic: Normal mood and affect   October 23, 2022  ECG (independently read by me):  Sinus bradycardia at 58, low voltage   July 10, 2021 ECG (independently read by me):  Low atrial rhythm at 60, QTc 468 msec, no ectopy  May 25, 2020 ECG (independently read by me): Probable low atrial rhythm with negative P waves in leads II and III.  Heart rate 55 bpm. No ectopy.  QTc interval 470 ms.  January 2021 ECG (independently read by me): Normal sinus rhythm at 61 bpm.  PR interval 170 ms, QTc interval 479 ms  June 2020 ECG (independently read by me): Sinus bradycardia at 52 bpm.  QTc interval 453 ms on sotalol;  no significant ST-T changes  June 2019 ECG (independently read by me): Sinus bradycardia 56 bpm.  QTc interval 474 ms.  December 2018 ECG (independently read by me): Normal sinus rhythm at 60 bpm.  QTc interval 454 ms.  No ST segment changes.  October 2018 ECG (independently read by me): Normal sinus rhythm at 62 bpm.  No significant ST-T changes.  No ectopy.  QTc interval 452 ms.  May 2018 ECG (independently read by  me): Sinus bradycardia 59 bpm.  Low voltage.  Normal intervals.  No ST segment changes.  October 2017 ECG (independently read by me): Sinus bradycardia 57 bpm.  No ectopy.  Normal intervals.  November 2016 ECG (independently read by me): Sinus bradycardia 59 bpm.  No ectopy.  Normal intervals.  QTC 457 ms.  05/22/2015 ECG (independently read by me): Normal sinus rhythm at 60 bpm.  QTc interval 454 ms.  No significant ST segment changes.  June 2016 ECG (independently read by me): Normal sinus rhythm at 63 bpm.  Poor precordial R-wave progression.  No ectopy.  November 2015 ECG (independently read by me);  Normal sinus rhythm at 59 bpm.  QTc interval 415 ms.  No significant ST segment changes.  Prior November 2014ECG: Sinus rhythm at 63 beats per minute. Normal intervals.  LABS:    Latest Ref Rng & Units 05/02/2022   10:50 AM 04/09/2022   11:20 AM 12/30/2021    8:08 AM  BMP  Glucose 70 - 99 mg/dL 109  102  132   BUN 8 - 23 mg/dL '18  22  16   '$ Creatinine 0.44 - 1.00 mg/dL 0.93  0.89  1.15   Sodium 135 - 145 mmol/L 141  140  139   Potassium 3.5 - 5.1 mmol/L 3.5  3.8  3.5   Chloride 98 - 111 mmol/L 104  101  101   CO2 22 - 32 mmol/L 28  32  27   Calcium 8.9 - 10.3 mg/dL 9.8  10.1  10.4       Latest Ref  Rng & Units 04/09/2022   11:20 AM 05/15/2021    2:14 PM 09/14/2019    9:44 AM  Hepatic Function  Total Protein 6.0 - 8.3 g/dL 7.5  7.2  6.8   Albumin 3.5 - 5.2 g/dL 4.4  4.1  4.4   AST 0 - 37 U/L '19  20  21   '$ ALT 0 - 35 U/L '14  14  15   '$ Alk Phosphatase 39 - 117 U/L 50  45  56   Total Bilirubin 0.2 - 1.2 mg/dL 1.1  1.2  0.9   Bilirubin, Direct 0.0 - 0.3 mg/dL  0.2        Latest Ref Rng & Units 05/02/2022   10:50 AM 04/09/2022   11:20 AM 12/30/2021    8:08 AM  CBC  WBC 4.0 - 10.5 K/uL 4.9  5.7  6.9   Hemoglobin 12.0 - 15.0 g/dL 14.9  14.8  17.5   Hematocrit 36.0 - 46.0 % 44.3  44.1  50.5   Platelets 150 - 400 K/uL 182  179.0  218    Lab Results  Component Value Date   MCV 93.1 05/02/2022   MCV 91.5 04/09/2022   MCV 90.2 12/30/2021   Lab Results  Component Value Date   TSH 1.38 05/19/2022   Lab Results  Component Value Date   HGBA1C 5.5 12/18/2016   Lipid Panel     Component Value Date/Time   CHOL 185 05/19/2022 1445   CHOL 184 09/14/2019 0944   TRIG 185.0 (H) 05/19/2022 1445   HDL 49.40 05/19/2022 1445   HDL 54 09/14/2019 0944   CHOLHDL 4 05/19/2022 1445   VLDL 37.0 05/19/2022 1445   LDLCALC 99 05/19/2022 1445   LDLCALC 99 09/14/2019 0944   LDLDIRECT 150.0 12/28/2017 1340    RADIOLOGY: US Breast Right  07/04/2013   CLINICAL DATA:  Abnormal screening right mammogram.  EXAM: DIGITAL  DIAGNOSTIC  right MAMMOGRAM  ULTRASOUND right BREAST  COMPARISON:  With prior exams.  ACR Breast Density Category b: There are scattered areas of fibroglandular density.  FINDINGS: Spot compression views of the lateral aspect of the right breast were performed. There is persistence of a 4 mm low-density nodule in the posterior 3rd of the breast. There are no malignant type microcalcifications.  On physical exam I do not palpate a mass in the right breast.  Ultrasound is performed, showing there is a near anechoic lesion in the right breast at 7 o'clock 8 cm from the nipple measuring 3 x 2 x 3 mm.   IMPRESSION: Probable benign lesion in the right breast.  RECOMMENDATION: Short-term interval followup right mammogram and ultrasound in 6 months is recommended to document stability.  I have discussed the findings and recommendations with the patient. Results were also provided in writing at the conclusion of the visit. If applicable, a reminder letter will be sent to the patient regarding the next appointment.  BI-RADS CATEGORY  3: Probably benign finding(s) - short interval follow-up suggested.   Electronically Signed   By: Lillia Mountain M.D.   On: 07/04/2013 09:01   [facility] [place] R  07/04/2013   CLINICAL DATA:  Abnormal screening right mammogram.  EXAM: DIGITAL DIAGNOSTIC  right MAMMOGRAM  ULTRASOUND right BREAST  COMPARISON:  With prior exams.  ACR Breast Density Category b: There are scattered areas of fibroglandular density.  FINDINGS: Spot compression views of the lateral aspect of the right breast were performed. There is persistence of a 4 mm low-density nodule in the posterior 3rd of the breast. There are no malignant type microcalcifications.  On physical exam I do not palpate a mass in the right breast.  Ultrasound is performed, showing there is a near anechoic lesion in the right breast at 7 o'clock 8 cm from the nipple measuring 3 x 2 x 3 mm.  IMPRESSION: Probable benign lesion in the right breast.  RECOMMENDATION: Short-term interval followup right mammogram and ultrasound in 6 months is recommended to document stability.  I have discussed the findings and recommendations with the patient. Results were also provided in writing at the conclusion of the visit. If applicable, a reminder letter will be sent to the patient regarding the next appointment.  BI-RADS CATEGORY  3: Probably benign finding(s) - short interval follow-up suggested.   Electronically Signed   By: Lillia Mountain M.D.   On: 07/04/2013 09:01   IMPRESSION:  1. PAF (paroxysmal atrial fibrillation) ([place])   2. Essential  hypertension   3. Anticoagulated   4. Hyperlipidemia with target LDL less than 70   5. PVD (peripheral vascular disease) ([place])      ASSESSMENT AND PLAN: Ms. Eckman is a young appearing 89 year-old female who has a history of hypertension, paroxysmal atrial fibrillation, hyperlipidemia and peripheral vascular disease. She  is on Xarelto for anticoagulation and tolerating this well.  She developed recurrent AF and underwent successful cardioversion during her emergency room evaluation on 01/15/2017.  She developed recurrent A. fib despite taking metoprolol and sotalol 80 mg twice a day.  She had 2 failed attempts in the emergency room in early November when she presented with AF with RVR.  Ultimately, her sotalol dose was increased to 120 twice a day and she was able to be successfully cardioverted.  She developed recurrent atrial fibrillation in May 2023 and underwent another successful cardioversion.  Her most recent echo following a September 2023 hospitalization  showed normal EF at 60 to 65% without wall motion abnormalities and with normal diastolic parameters.  She had normal pulmonary artery systolic pressure.  There was mild MR and mild aortic valve sclerosis without stenosis.  Over the past several years she had been under increased stress with her husband's illness who ultimately died on September 28, 2021.  Presently, her blood pressure is stable on her current regimen of chlorthalidone 25 mg daily, metoprolol tartrate 37.5 mg twice a day in addition to her sotalol 120 mg twice a day.  She has not had any recent recurrent AF.  She is on Livalo 2 mg which she tolerates for hyperlipidemia.  She has continued to be on Xarelto 20 mg anticoagulation renal function is normal with creatinine 0.93 on May 02, 2022. She has previously documented PVD with reduced ABI in the left with occlusive disease in the proximal to mid left superficial femoral artery with mono phasic flow and three-vessel runoff in the  popliteal and anterior tibial arteries.  She denies any claudication symptomatology.  She sees Dr. [name] for primary care.  Clinically she is stable.  I will see her in 1 year for follow-up evaluation or sooner as needed.  Troy Sine, MD, [facility]  10/25/2022 11:44 AM

## 2022-10-25 ENCOUNTER — Encounter: Payer: Self-pay | Admitting: Cardiovascular Disease

## 2022-10-30 NOTE — Progress Notes (Signed)
[name]    JL:[id number]    [birth year]  Primary Care Physician:Jones, Arvid Right, MD  Referring Physician: Janith Lima, MD [place],  [place] [id number]   Chief complaint: IBS Chief Complaint  Patient presents with   Irritable Bowel Syndrome    Patient states her bowels are more regular now with adjusting her fiber and Miralax. Patient reports overall doing well.     HPI: 89 year old female with history of chronic GERD, hernia, esophageal stricture status post dilation, sigmoid cancer status post sigmoid resection and anastomotic stricture   I last saw her on 06/19/2022. At that time she had intermittent diarrhea alternating with constipation, dyspepsia, and indigestion. She was concerned what she can eat as many foods tear of her stomach   Today, she reports feeling well. She states that her alternating constipation and diarrhea has been resolved after taking Benefiber daily. She states that she takes Miralax PRN. She reports having a BM once a day and that her stool is formed. She denies abdominal pain.   Denies any blood in stool, melena, vomiting, abdominal pain or nausea.  She reports gaining 6-7 pounds over the holidays.   Edema  GI Hx; Echocardiogram 05/21/2022 1 Left ventricular ejection fraction, by estimation, is 60 to 65%. The left ventricle has normal function. The left ventricle has no regional wall motion abnormalities. There is mild left ventricular hypertrophy. Left ventricular diastolic parameters were normal.  2. Right ventricular systolic function is normal. The right ventricular size is normal. There is normal pulmonary artery systolic pressure.  3. The mitral valve is normal in structure. Mild mitral valve regurgitation. No evidence of mitral stenosis.  4. The aortic valve is normal in structure. Aortic valve regurgitation is trivial. Aortic valve sclerosis is present, with no evidence of aortic valve stenosis.  5. The  inferior vena cava is normal in size with greater than 50% respiratory variability, suggesting right atrial pressure of 3 mmHg.   Cardiac Catheterization  Mid Cx lesion, 10% stenosed. The left ventricular systolic function is normal.   Normal LV function with an estimated ejection fraction of 55-60%.   No significant coronary obstructive disease with the midportion of the mid LAD dipping intramyocardially without evidence for systolic bridging, smooth 10% narrowing in the AV groove circumflex  coronary artery, and normal dominant RCA.   EGD July 19, 2014  distal esophageal stricture dilated from 14 to 18 mm prior to that EGD April 2014   Colonoscopy October 2012  showed minimal narrowing at sigmoid anastomosis at 20 cm.  No recall due to age.        Current Outpatient Medications:    acetaminophen (TYLENOL) 500 MG tablet, Take 1,000 mg by mouth every 6 (six) hours as needed for headache (pain)., Disp: , Rfl:    Calcium Carbonate-Vitamin D (CALCIUM-D PO), Take 1 tablet by mouth at bedtime. Calcium 630 mg, Vitamin D3 500 units, Disp: , Rfl:    chlorthalidone (HYGROTON) 25 MG tablet, TAKE 1 TABLET EVERY DAY, Disp: 90 tablet, Rfl: 0   cyanocobalamin 2000 MCG tablet, Take 1 tablet (2,000 mcg total) by mouth every other day. (Patient taking differently: Take 2,000 mcg by mouth daily.), Disp: 45 tablet, Rfl: 1   diclofenac sodium (VOLTAREN) 1 % GEL, Apply 2 g topically 4 (four) times daily., Disp: 100 g, Rfl: 1   Dietary Management Product (FOSTEUM PLUS) CAPS,  Take 1 capsule by mouth 2 (two) times daily., Disp: 180 capsule, Rfl: 1   diphenoxylate-atropine (LOMOTIL) 2.5-0.025 MG tablet, Take 1 tablet by mouth 4 (four) times daily as needed for diarrhea or loose stools., Disp: 30 tablet, Rfl: 0   famotidine (PEPCID) 40 MG tablet, Take 1 tablet (40 mg total) by mouth at bedtime., Disp: 90 tablet, Rfl: 1   finasteride (PROPECIA) 1 MG tablet, TAKE 1 TABLET AT BEDTIME, Disp: 90 tablet, Rfl: 1    gabapentin (NEURONTIN) 100 MG capsule, Take 1 capsule (100 mg total) by mouth 3 (three) times daily., Disp: 270 capsule, Rfl: 1   metoprolol tartrate (LOPRESSOR) 25 MG tablet, TAKE 1 AND 1/2 TABLETS IN THE MORNING AND TAKE 1 TABLET AT BEDTIME, Disp: 225 tablet, Rfl: 0   Peppermint Oil (IBGARD) 90 MG CPCR, 1 capsule three times daily as needed, Disp: , Rfl:    Pitavastatin Calcium (LIVALO) 2 MG TABS, Take 1 tablet (2 mg total) by mouth daily., Disp: 90 tablet, Rfl: 1   polyethylene glycol powder (MIRALAX) 17 GM/SCOOP powder, 1/2 capful daily., Disp: 255 g, Rfl: 0   potassium chloride (KLOR-CON M) 10 MEQ tablet, TAKE 2 TABLETS DAILY (DOSE INCREASED DUE TO HYPOKALEMIA), Disp: 180 tablet, Rfl: 3   rivaroxaban (XARELTO) 20 MG TABS tablet, Take 1 tablet (20 mg total) by mouth daily with supper., Disp: 14 tablet, Rfl: 0   sotalol (BETAPACE) 120 MG tablet, TAKE 1 TABLET (120 MG TOTAL) BY MOUTH EVERY 12 (TWELVE) HOURS., Disp: 180 tablet, Rfl: 3   traMADol (ULTRAM) 50 MG tablet, TAKE 1 TABLET BY MOUTH EVERY 6 HOURS AS NEEDED, Disp: 90 tablet, Rfl: 5   Wheat Dextrin (BENEFIBER) POWD, 1 tablespoon 2 to 3 times daily with meals (Patient taking differently: Take by mouth daily in the afternoon. 1 tablespoon 2 to 3 times daily with meals), Disp: 30 g, Rfl: 0    Allergies as of 10/31/2022 - Review Complete 10/31/2022  Allergen Reaction Noted   Crestor [rosuvastatin calcium] Other (See Comments) 12/25/2016   Acetaminophen-codeine Other (See Comments)    Amlodipine besylate Swelling 11/09/2008   Hydrocodone-acetaminophen Nausea And Vomiting    Irbesartan-hydrochlorothiazide Other (See Comments) 11/09/2008   Lisinopril Cough    Omeprazole Nausea Only and Other (See Comments) 11/15/2014   Propoxyphene n-acetaminophen Other (See Comments)    Valsartan Other (See Comments) 11/09/2008   Warfarin sodium Other (See Comments) 11/09/2008   Diltiazem hcl Rash 11/09/2008   Verapamil Nausea Only, Palpitations, and  Other (See Comments) 06/02/2011    Past Medical History:  Diagnosis Date   Abnormal nuclear stress test    mild anterolateral septal and inferior ischemia   Arthritis    Atherosclerosis of lower extremity with claudication ([place]) 04/17/2015   LEFT LEG   Atrial fibrillation (HCC)    Cardiac arrhythmia due to congenital heart disease    Claudication [facility])    Colon cancer ([place])    Coronary atherosclerosis of unspecified type of vessel, native or graft    Esophageal stricture    Fatty liver 11/05/2011   Hiatal hernia    HLD (hyperlipidemia)    Hypertension    IBS (irritable bowel syndrome)    Iron deficiency anemia, unspecified    Ischemic colitis (HCC)    Neuropathy    Osteoporosis    PAF (paroxysmal atrial fibrillation) ([place]) 04/2015   Ulcerative colitis ([place])    Unspecified vascular insufficiency of intestine     Past Surgical History:  Procedure Laterality Date   abdominoperineal resection  anastomotic stricture  05/03/2007   APPENDECTOMY     CARDIAC CATHETERIZATION N/A 03/13/2015   Procedure: Left Heart Cath and Coronary Angiography;  Surgeon: Troy Sine, MD;  Location: [place] [facility];  Service: Cardiovascular;  Laterality: N/A;   CARDIOVERSION N/A 04/20/2015   Procedure: CARDIOVERSION;  Surgeon: Jerline Pain, MD;  Location: [place];  Service: Cardiovascular;  Laterality: N/A;   CARDIOVERSION N/A 07/10/2017   Procedure: CARDIOVERSION;  Surgeon: Jerline Pain, MD;  Location: [place] [facility];  Service: Cardiovascular;  Laterality: N/A;   COLON SURGERY     DILATION AND CURETTAGE OF UTERUS  09/01/1990   ESOPHAGOGASTRODUODENOSCOPY N/A 04/27/2014   Procedure: ESOPHAGOGASTRODUODENOSCOPY (EGD);  Surgeon: Lafayette Dragon, MD;  Location: [facility];  Service: Endoscopy;  Laterality: N/A;   Mandibular Renstruction     ROTATOR CUFF REPAIR  09/01/2004   left   Rt. [place]pingo oophorectomy and cyst removal  09/02/2003   SAVORY DILATION N/A 04/27/2014   Procedure: SAVORY  DILATION;  Surgeon: Lafayette Dragon, MD;  Location: [facility];  Service: Endoscopy;  Laterality: N/A;  no xray needed   sigmoid resection for invasive rectal adenocarcinoma  12/31/2006   TEE WITHOUT CARDIOVERSION N/A 04/20/2015   Procedure: TRANSESOPHAGEAL ECHOCARDIOGRAM (TEE);  Surgeon: Jerline Pain, MD;  Location[facility];  Service: Cardiovascular;  Laterality: N/A;   TUBAL LIGATION     WRIST SURGERY  09/01/2006   right    Family History  Problem Relation Age of Onset   Colon cancer Father 84   Heart disease Father    Hypertension Father    Alzheimer's disease Mother    Thyroid disease Mother    Hypertension Sister    Thyroid disease Sister    Mitral valve prolapse Sister    Hypertension Son    Diabetes Son    Hypertension Son    Diabetes Son    Esophageal cancer Neg Hx    Rectal cancer Neg Hx    Stomach cancer Neg Hx     Social History   Socioeconomic History   Marital status: Widowed    Spouse name: Not on file   Number of children: 4   Years of education: 12th grade   Highest education level: Not on file  Occupational History   Occupation: retired    Fish farm manager: RETIRED  Tobacco Use   Smoking status: Never    Passive exposure: Never   Smokeless tobacco: Never  Vaping Use   Vaping Use: Never used  Substance and Sexual Activity   Alcohol use: No   Drug use: No   Sexual activity: Not Currently  Other Topics Concern   Not on file  Social History Narrative   Lives at home with fiance.   Right-handed.   No caffeine use.   Social Determinants of Health   Financial Resource Strain: Low Risk  (11/13/2021)   Overall Financial Resource Strain (CARDIA)    Difficulty of Paying Living Expenses: Not hard at all  Food Insecurity: No Food Insecurity (11/13/2021)   Hunger Vital Sign    Worried About Running Out of Food in the Last Year: Never true    Ran Out of Food in the Last Year: Never true  Transportation Needs: No Transportation Needs (11/13/2021)    PRAPARE - Hydrologist (Medical): No    Lack of Transportation (Non-Medical): No  Physical Activity: Inactive (11/13/2021)   Exercise Vital Sign    Days of Exercise per Week: 0 days  Minutes of Exercise per Session: 0 min  Stress: No Stress Concern Present (11/13/2021)   [place]    Feeling of Stress : Not at all  Social Connections: Moderately Integrated (11/13/2021)   Social Connection and Isolation Panel [NHANES]    Frequency of Communication with Friends and Family: More than three times a week    Frequency of Social Gatherings with Friends and Family: More than three times a week    Attends Religious Services: More than 4 times per year    Active Member of [employer] or Organizations: Yes    Attends Archivist Meetings: More than 4 times per year    Marital Status: Widowed  Intimate Partner Violence: Not At Risk (11/13/2021)   Humiliation, Afraid, Rape, and Kick questionnaire    Fear of Current or Ex-Partner: No    Emotionally Abused: No    Physically Abused: No    Sexually Abused: No      Review of systems: Review of Systems  Constitutional:  Positive for unexpected weight change.  Cardiovascular:  Positive for leg swelling.  Gastrointestinal:  Negative for abdominal distention and abdominal pain.      Physical Exam: General: well-appearing  Eyes: sclera anicteric, no redness ENT: oral mucosa moist without lesions, no cervical or supraclavicular lymphadenopathy CV: RRR, no JVD,  peripheral pedal edema Resp: clear to auscultation bilaterally, normal RR and effort noted GI: soft, no tenderness, with active bowel sounds. No guarding or palpable organomegaly noted. No bloating, Skin; warm and dry, no rash or jaundice noted Neuro: awake, alert and oriented x 3. Normal gross motor function and fluent speech   Data Reviewed:  Reviewed labs, radiology imaging, old records  and pertinent past GI work up   Assessment and Plan/Recommendations:  89 year old female with history of chronic GERD, esophageal stricture status post dilation to 18 mm, sigmoid colon cancer in 2008 status post resection with anastomotic stricture here for follow-up visit for GERD and IBS  Irritable bowel syndrome with constipation and diarrhea Use Benefiber 1 tablespoon 1-3 times daily with meals Use MiraLAX half to 1 capful daily and titrate as needed Use IBgard 1 capsule up to 3 times daily as needed    GERD: Continue famotidine Continue antireflux measures Use FD Gard 1 capsule up to 3 times daily as needed for dyspepsia symptoms   Return as needed  The patient was provided an opportunity to ask questions and all were answered. The patient agreed with the plan and demonstrated an understanding of the instructions.   I,Safa M Kadhim,acting as a scribe for Harl Bowie, MD.,have documented all relevant documentation on the behalf of Harl Bowie, MD,as directed by  Harl Bowie, MD while in the presence of Harl Bowie, MD.   I, Harl Bowie, MD, have reviewed all documentation for this visit. The documentation on 10/31/22 for the exam, diagnosis, procedures, and orders are all accurate and complete.   Damaris Hippo , MD    CC: Janith Lima, MD

## 2022-10-31 ENCOUNTER — Ambulatory Visit: Payer: Medicare HMO | Admitting: Gastroenterology

## 2022-10-31 ENCOUNTER — Encounter: Payer: Self-pay | Admitting: Gastroenterology

## 2022-10-31 VITALS — BP 142/74 | HR 83 | Ht 66.0 in | Wt 193.0 lb

## 2022-10-31 DIAGNOSIS — K219 Gastro-esophageal reflux disease without esophagitis: Secondary | ICD-10-CM

## 2022-10-31 DIAGNOSIS — K582 Mixed irritable bowel syndrome: Secondary | ICD-10-CM

## 2022-10-31 NOTE — Patient Instructions (Signed)
Continue Benefiber and Miralax   Follow up as needed   _______________________________________________________  If your blood pressure at your visit was 140/90 or greater, please contact your primary care physician to follow up on this.  _______________________________________________________  If you are age 87 or older, your body mass index should be between 23-30. Your Body mass index is 31.15 kg/m. If this is out of the aforementioned range listed, please consider follow up with your Primary Care Provider.  If you are age 32 or younger, your body mass index should be between 19-25. Your Body mass index is 31.15 kg/m. If this is out of the aformentioned range listed, please consider follow up with your Primary Care Provider.   ________________________________________________________  The [place] [facility] providers would like to encourage you to use [facility] to communicate with providers for non-urgent requests or questions.  Due to long hold times on the telephone, sending your provider a message by [facility] may be a faster and more efficient way to get a response.  Please allow 48 business hours for a response.  Please remember that this is for non-urgent requests.  _______________________________________________________   Thank you for choosing [facility]  Kavitha Nandigam,MD

## 2022-11-04 ENCOUNTER — Other Ambulatory Visit: Payer: Self-pay | Admitting: Nurse Practitioner

## 2022-11-04 ENCOUNTER — Other Ambulatory Visit: Payer: Self-pay | Admitting: Internal Medicine

## 2022-11-04 DIAGNOSIS — I480 Paroxysmal atrial fibrillation: Secondary | ICD-10-CM

## 2022-11-04 DIAGNOSIS — I10 Essential (primary) hypertension: Secondary | ICD-10-CM

## 2022-11-17 ENCOUNTER — Telehealth: Payer: Self-pay | Admitting: Internal Medicine

## 2022-11-17 DIAGNOSIS — M818 Other osteoporosis without current pathological fracture: Secondary | ICD-10-CM

## 2022-11-17 MED ORDER — FOSTEUM PLUS PO CAPS: 1.0000 | ORAL_CAPSULE | Freq: Two times a day (BID) | ORAL | 2 refills | Status: DC

## 2022-11-17 NOTE — Telephone Encounter (Signed)
Notified pt rx sent to [name]

## 2022-11-17 NOTE — Telephone Encounter (Signed)
Patietn called and said [facility] has contacted PCP about refilling Fosteum plus. They said they hadn't heard anything back.  Best callback for patient is [phone].

## 2022-11-26 DIAGNOSIS — M1712 Unilateral primary osteoarthritis, left knee: Secondary | ICD-10-CM | POA: Diagnosis not present

## 2022-12-01 ENCOUNTER — Other Ambulatory Visit: Payer: Self-pay | Admitting: Internal Medicine

## 2022-12-01 DIAGNOSIS — I480 Paroxysmal atrial fibrillation: Secondary | ICD-10-CM

## 2022-12-23 ENCOUNTER — Telehealth: Payer: Self-pay | Admitting: Cardiovascular Disease

## 2022-12-23 DIAGNOSIS — I480 Paroxysmal atrial fibrillation: Secondary | ICD-10-CM

## 2022-12-23 NOTE — Telephone Encounter (Signed)
*  STAT* If patient is at the pharmacy, call can be transferred to refill team.   1. Which medications need to be refilled? (please list name of each medication and dose if known) rivaroxaban (XARELTO) 20 MG TABS tablet Take 1 tablet (20 mg total) by mouth daily with supper.   2. Which pharmacy/location (including street and city if local pharmacy) is medication to be sent to?[facility] ([facility]) - [address] [place], [place] - [address]  3. Do they need a 30 day or 90 day supply? 90 Day Supply

## 2022-12-24 MED ORDER — RIVAROXABAN 20 MG PO TABS: 20.0000 mg | ORAL_TABLET | Freq: Every day | ORAL | 1 refills | Status: DC

## 2022-12-24 NOTE — Telephone Encounter (Signed)
Pt last saw Dr Tresa Endo 10/23/22, last labs 05/02/22 Creat 0.93, age 89, weight 87.5kg, CrCl 57.76, based on CrCl pt is on appropriate dosage of Xarelto  QD for afib.  Will refill rx.

## 2022-12-31 DIAGNOSIS — M1712 Unilateral primary osteoarthritis, left knee: Secondary | ICD-10-CM | POA: Diagnosis not present

## 2023-01-05 ENCOUNTER — Other Ambulatory Visit: Payer: Self-pay | Admitting: Internal Medicine

## 2023-01-05 DIAGNOSIS — L659 Nonscarring hair loss, unspecified: Secondary | ICD-10-CM

## 2023-01-19 ENCOUNTER — Other Ambulatory Visit: Payer: Self-pay | Admitting: Internal Medicine

## 2023-01-19 DIAGNOSIS — I10 Essential (primary) hypertension: Secondary | ICD-10-CM

## 2023-02-16 ENCOUNTER — Other Ambulatory Visit: Payer: Self-pay | Admitting: Internal Medicine

## 2023-02-16 ENCOUNTER — Ambulatory Visit (INDEPENDENT_AMBULATORY_CARE_PROVIDER_SITE_OTHER): Payer: Medicare HMO | Admitting: Internal Medicine

## 2023-02-16 ENCOUNTER — Encounter: Payer: Self-pay | Admitting: Internal Medicine

## 2023-02-16 VITALS — BP 118/72 | HR 60 | Temp 98.1°F | Ht 66.0 in | Wt 190.0 lb

## 2023-02-16 DIAGNOSIS — I480 Paroxysmal atrial fibrillation: Secondary | ICD-10-CM

## 2023-02-16 DIAGNOSIS — K219 Gastro-esophageal reflux disease without esophagitis: Secondary | ICD-10-CM | POA: Diagnosis not present

## 2023-02-16 DIAGNOSIS — H60321 Hemorrhagic otitis externa, right ear: Secondary | ICD-10-CM

## 2023-02-16 DIAGNOSIS — G320 Subacute combined degeneration of spinal cord in diseases classified elsewhere: Secondary | ICD-10-CM

## 2023-02-16 DIAGNOSIS — I10 Essential (primary) hypertension: Secondary | ICD-10-CM | POA: Diagnosis not present

## 2023-02-16 DIAGNOSIS — E538 Deficiency of other specified B group vitamins: Secondary | ICD-10-CM

## 2023-02-16 DIAGNOSIS — E785 Hyperlipidemia, unspecified: Secondary | ICD-10-CM | POA: Diagnosis not present

## 2023-02-16 LAB — CBC WITH DIFFERENTIAL/PLATELET
Basophils Absolute: 0 10*3/uL (ref 0.0–0.1)
Basophils Relative: 0.6 % (ref 0.0–3.0)
Eosinophils Absolute: 0.2 10*3/uL (ref 0.0–0.7)
Eosinophils Relative: 3.4 % (ref 0.0–5.0)
HCT: 43.4 % (ref 36.0–46.0)
Hemoglobin: 14.1 g/dL (ref 12.0–15.0)
Lymphocytes Relative: 32.9 % (ref 12.0–46.0)
Lymphs Abs: 1.7 10*3/uL (ref 0.7–4.0)
MCHC: 32.6 g/dL (ref 30.0–36.0)
MCV: 88.9 fl (ref 78.0–100.0)
Monocytes Absolute: 0.6 10*3/uL (ref 0.1–1.0)
Monocytes Relative: 10.7 % (ref 3.0–12.0)
Neutro Abs: 2.7 10*3/uL (ref 1.4–7.7)
Neutrophils Relative %: 52.4 % (ref 43.0–77.0)
Platelets: 204 10*3/uL (ref 150.0–400.0)
RBC: 4.88 Mil/uL (ref 3.87–5.11)
RDW: 14.8 % (ref 11.5–15.5)
WBC: 5.2 10*3/uL (ref 4.0–10.5)

## 2023-02-16 LAB — VITAMIN B12: Vitamin B-12: 552 pg/mL (ref 211–911)

## 2023-02-16 LAB — BASIC METABOLIC PANEL
BUN: 20 mg/dL (ref 6–23)
CO2: 31 mEq/L (ref 19–32)
Calcium: 10 mg/dL (ref 8.4–10.5)
Chloride: 100 mEq/L (ref 96–112)
Creatinine, Ser: 0.98 mg/dL (ref 0.40–1.20)
GFR: 51.65 mL/min — ABNORMAL LOW (ref >60.00)
Glucose, Bld: 108 mg/dL — ABNORMAL HIGH (ref 70–99)
Potassium: 3.7 mEq/L (ref 3.5–5.1)
Sodium: 139 mEq/L (ref 135–145)

## 2023-02-16 LAB — FOLATE: Folate: >23.8 ng/mL (ref >5.9)

## 2023-02-16 LAB — TSH: TSH: 1.43 u[IU]/mL (ref 0.35–5.50)

## 2023-02-16 MED ORDER — NEOMYCIN-POLYMYXIN-HC 1 % OT SOLN
3.0000 [drp] | Freq: Three times a day (TID) | OTIC | 0 refills | Status: DC
Start: 2023-02-16 — End: 2023-09-22

## 2023-02-16 MED ORDER — RIVAROXABAN 15 MG PO TABS
15.0000 mg | ORAL_TABLET | Freq: Every day | ORAL | 0 refills | Status: DC
Start: 2023-02-16 — End: 2023-02-16

## 2023-02-16 MED ORDER — RIVAROXABAN 15 MG PO TABS
15.0000 mg | ORAL_TABLET | Freq: Every day | ORAL | 0 refills | Status: DC
Start: 2023-02-16 — End: 2023-09-22

## 2023-02-16 NOTE — Patient Instructions (Signed)
Hypertension, Adult High blood pressure (hypertension) is when the force of blood pumping through the arteries is too strong. The arteries are the blood vessels that carry blood from the heart throughout the body. Hypertension forces the heart to work harder to pump blood and may cause arteries to become narrow or stiff. Untreated or uncontrolled hypertension can lead to a heart attack, heart failure, a stroke, kidney disease, and other problems. A blood pressure reading consists of a higher number over a lower number. Ideally, your blood pressure should be below 120/80. The first ("top") number is called the systolic pressure. It is a measure of the pressure in your arteries as your heart beats. The second ("bottom") number is called the diastolic pressure. It is a measure of the pressure in your arteries as the heart relaxes. What are the causes? The exact cause of this condition is not known. There are some conditions that result in high blood pressure. What increases the risk? Certain factors may make you more likely to develop high blood pressure. Some of these risk factors are under your control, including: Smoking. Not getting enough exercise or physical activity. Being overweight. Having too much fat, sugar, calories, or salt (sodium) in your diet. Drinking too much alcohol. Other risk factors include: Having a personal history of heart disease, diabetes, high cholesterol, or kidney disease. Stress. Having a family history of high blood pressure and high cholesterol. Having obstructive sleep apnea. Age. The risk increases with age. What are the signs or symptoms? High blood pressure may not cause symptoms. Very high blood pressure (hypertensive crisis) may cause: Headache. Fast or irregular heartbeats (palpitations). Shortness of breath. Nosebleed. Nausea and vomiting. Vision changes. Severe chest pain, dizziness, and seizures. How is this diagnosed? This condition is diagnosed by  measuring your blood pressure while you are seated, with your arm resting on a flat surface, your legs uncrossed, and your feet flat on the floor. The cuff of the blood pressure monitor will be placed directly against the skin of your upper arm at the level of your heart. Blood pressure should be measured at least twice using the same arm. Certain conditions can cause a difference in blood pressure between your right and left arms. If you have a high blood pressure reading during one visit or you have normal blood pressure with other risk factors, you may be asked to: Return on a different day to have your blood pressure checked again. Monitor your blood pressure at home for 1 week or longer. If you are diagnosed with hypertension, you may have other blood or imaging tests to help your health care provider understand your overall risk for other conditions. How is this treated? This condition is treated by making healthy lifestyle changes, such as eating healthy foods, exercising more, and reducing your alcohol intake. You may be referred for counseling on a healthy diet and physical activity. Your health care provider may prescribe medicine if lifestyle changes are not enough to get your blood pressure under control and if: Your systolic blood pressure is above 130. Your diastolic blood pressure is above 80. Your personal target blood pressure may vary depending on your medical conditions, your age, and other factors. Follow these instructions at home: Eating and drinking  Eat a diet that is high in fiber and potassium, and low in sodium, added sugar, and fat. An example of this eating plan is called the DASH diet. DASH stands for Dietary Approaches to Stop Hypertension. To eat this way: Eat   plenty of fresh fruits and vegetables. Try to fill one half of your plate at each meal with fruits and vegetables. Eat whole grains, such as whole-wheat pasta, brown rice, or whole-grain bread. Fill about one  fourth of your plate with whole grains. Eat or drink low-fat dairy products, such as skim milk or low-fat yogurt. Avoid fatty cuts of meat, processed or cured meats, and poultry with skin. Fill about one fourth of your plate with lean proteins, such as fish, chicken without skin, beans, eggs, or tofu. Avoid pre-made and processed foods. These tend to be higher in sodium, added sugar, and fat. Reduce your daily sodium intake. Many people with hypertension should eat less than 1,500 mg of sodium a day. Do not drink alcohol if: Your health care provider tells you not to drink. You are pregnant, may be pregnant, or are planning to become pregnant. If you drink alcohol: Limit how much you have to: 0-1 drink a day for women. 0-2 drinks a day for men. Know how much alcohol is in your drink. In the [place] one drink equals one 12 oz bottle of beer (355 mL), one 5 oz glass of wine (148 mL), or one 1 oz glass of hard liquor (44 mL). Lifestyle  Work with your health care provider to maintain a healthy body weight or to lose weight. Ask what an ideal weight is for you. Get at least 30 minutes of exercise that causes your heart to beat faster (aerobic exercise) most days of the week. Activities may include walking, swimming, or biking. Include exercise to strengthen your muscles (resistance exercise), such as Pilates or lifting weights, as part of your weekly exercise routine. Try to do these types of exercises for 30 minutes at least 3 days a week. Do not use any products that contain nicotine or tobacco. These products include cigarettes, chewing tobacco, and vaping devices, such as e-cigarettes. If you need help quitting, ask your health care provider. Monitor your blood pressure at home as told by your health care provider. Keep all follow-up visits. This is important. Medicines Take over-the-counter and prescription medicines only as told by your health care provider. Follow directions carefully. Blood  pressure medicines must be taken as prescribed. Do not skip doses of blood pressure medicine. Doing this puts you at risk for problems and can make the medicine less effective. Ask your health care provider about side effects or reactions to medicines that you should watch for. Contact a health care provider if you: Think you are having a reaction to a medicine you are taking. Have headaches that keep coming back (recurring). Feel dizzy. Have swelling in your ankles. Have trouble with your vision. Get help right away if you: Develop a severe headache or confusion. Have unusual weakness or numbness. Feel faint. Have severe pain in your chest or abdomen. Vomit repeatedly. Have trouble breathing. These symptoms may be an emergency. Get help right away. Call 911. Do not wait to see if the symptoms will go away. Do not drive yourself to the hospital. Summary Hypertension is when the force of blood pumping through your arteries is too strong. If this condition is not controlled, it may put you at risk for serious complications. Your personal target blood pressure may vary depending on your medical conditions, your age, and other factors. For most people, a normal blood pressure is less than 120/80. Hypertension is treated with lifestyle changes, medicines, or a combination of both. Lifestyle changes include losing weight, eating a healthy,   low-sodium diet, exercising more, and limiting alcohol. This information is not intended to replace advice given to you by your health care provider. Make sure you discuss any questions you have with your health care provider. Document Revised: 06/25/2021 Document Reviewed: 06/25/2021 Elsevier Patient Education  2024 [employer]  

## 2023-02-16 NOTE — Progress Notes (Unsigned)
Subjective:  Patient ID: [name], female    DOB: [birth year]  Age: 89 y.o. MRN: [id number]  CC: Hypertension   HPI [name] presents for f/up ---  Discussed the use of AI scribe software for clinical note transcription with the patient, who gave verbal consent to proceed.  History of Present Illness   The patient presents for a prescription refill and reports persistent fatigue. She describes significant dyspnea on exertion, such as walking to the mailbox, attributing it to their known condition of atrial fibrillation (AFib) and possibly their medication, Metoprolol. However, she denies experiencing any calf pain or cramps during these episodes of exertion.  In addition to fatigue and dyspnea, the patient also reports balance issues, which she attributes to neuropathy. She denies any joint pain or aches, and while she notes occasional ankle swelling after riding for extended periods, she does not consider it a significant issue due to their limited travel distances.  The patient's last EKG was performed a few weeks prior by their cardiologist, Dr. Tresa Endo, who reportedly found no concerning issues. The patient is also on Xarelto, with a GFR last measured in the previous August.       Outpatient Medications Prior to Visit  Medication Sig Dispense Refill   acetaminophen (TYLENOL) 500 MG tablet Take 1,000 mg by mouth every 6 (six) hours as needed for headache (pain).     Calcium Carbonate-Vitamin D (CALCIUM-D PO) Take 1 tablet by mouth at bedtime. Calcium 630 mg, Vitamin D3 500 units     cyanocobalamin 2000 MCG tablet Take 1 tablet (2,000 mcg total) by mouth every other day. (Patient taking differently: Take 2,000 mcg by mouth daily.) 45 tablet 1   diclofenac sodium (VOLTAREN) 1 % GEL Apply 2 g topically 4 (four) times daily. 100 g 1   Dietary Management Product (FOSTEUM PLUS) CAPS Take 1 capsule by mouth 2 (two) times daily. 180 capsule 2   diphenoxylate-atropine (LOMOTIL)  2.5-0.025 MG tablet Take 1 tablet by mouth 4 (four) times daily as needed for diarrhea or loose stools. 30 tablet 0   famotidine (PEPCID) 40 MG tablet Take 1 tablet (40 mg total) by mouth at bedtime. 90 tablet 1   finasteride (PROPECIA) 1 MG tablet TAKE 1 TABLET AT BEDTIME 90 tablet 0   gabapentin (NEURONTIN) 100 MG capsule Take 1 capsule (100 mg total) by mouth 3 (three) times daily. 270 capsule 1   metoprolol tartrate (LOPRESSOR) 25 MG tablet TAKE 1 AND 1/2 TABLETS IN THE MORNING AND TAKE 1 TABLET AT BEDTIME 225 tablet 3   Peppermint Oil (IBGARD) 90 MG CPCR 1 capsule three times daily as needed     polyethylene glycol powder (MIRALAX) 17 GM/SCOOP powder 1/2 capful daily. 255 g 0   potassium chloride (KLOR-CON M) 10 MEQ tablet TAKE 2 TABLETS DAILY (DOSE INCREASED DUE TO HYPOKALEMIA) 180 tablet 3   sotalol (BETAPACE) 120 MG tablet TAKE 1 TABLET (120 MG TOTAL) BY MOUTH EVERY 12 (TWELVE) HOURS. 180 tablet 3   traMADol (ULTRAM) 50 MG tablet TAKE 1 TABLET BY MOUTH EVERY 6 HOURS AS NEEDED 90 tablet 5   Wheat Dextrin (BENEFIBER) POWD 1 tablespoon 2 to 3 times daily with meals (Patient taking differently: Take by mouth daily in the afternoon. 1 tablespoon 2 to 3 times daily with meals) 30 g 0   chlorthalidone (HYGROTON) 25 MG tablet TAKE 1 TABLET EVERY DAY 90 tablet 0   Pitavastatin Calcium (LIVALO) 2 MG TABS Take 1 tablet (2 mg  total) by mouth daily. 90 tablet 1   rivaroxaban (XARELTO) 20 MG TABS tablet Take 1 tablet (20 mg total) by mouth daily with supper. 90 tablet 1   No facility-administered medications prior to visit.    ROS Review of Systems  Constitutional:  Positive for fatigue. Negative for appetite change, diaphoresis and unexpected weight change.  HENT:  Positive for ear pain and hearing loss. Negative for ear discharge, tinnitus and trouble swallowing.   Respiratory:  Positive for shortness of breath. Negative for cough, chest tightness and wheezing.   Cardiovascular:  Negative for  chest pain, palpitations and leg swelling.  Gastrointestinal:  Negative for abdominal pain, diarrhea, nausea and vomiting.  Genitourinary: Negative.  Negative for difficulty urinating.  Musculoskeletal: Negative.   Skin: Negative.   Neurological:  Negative for dizziness, weakness and light-headedness.  Hematological:  Negative for adenopathy. Does not bruise/bleed easily.  Psychiatric/Behavioral: Negative.      Objective:  BP 118/72 (BP Location: Right Arm, Patient Position: Sitting, Cuff Size: Large)   Pulse 60   Temp 98.1 F (36.7 C) (Oral)   Ht 5\' 6"  (1.676 m)   Wt 190 lb (86.2 kg)   SpO2 95%   BMI 30.67 kg/m   BP Readings from Last 3 Encounters:  02/16/23 118/72  10/31/22 (!) 142/74  10/23/22 (!) 162/64    Wt Readings from Last 3 Encounters:  02/16/23 190 lb (86.2 kg)  10/31/22 193 lb (87.5 kg)  10/23/22 194 lb 3.2 oz (88.1 kg)    Physical Exam Vitals reviewed.  Constitutional:      Appearance: Normal appearance.  HENT:     Right Ear: Tympanic membrane normal. Decreased hearing noted. No middle ear effusion. There is no impacted cerumen. No mastoid tenderness.     Left Ear: Tympanic membrane normal. Decreased hearing noted.  No middle ear effusion. There is no impacted cerumen. No mastoid tenderness.     Ears:     Comments: Right EAC - the floor reveals an area of swelling and dried blood    Nose: Nose normal.     Mouth/Throat:     Mouth: Mucous membranes are moist.  Eyes:     General: No scleral icterus.    Conjunctiva/sclera: Conjunctivae normal.  Cardiovascular:     Rate and Rhythm: Normal rate and regular rhythm.     Heart sounds: No murmur heard. Pulmonary:     Effort: Pulmonary effort is normal.     Breath sounds: No stridor. No wheezing, rhonchi or rales.  Abdominal:     General: Abdomen is flat.     Palpations: There is no mass.     Tenderness: There is no abdominal tenderness. There is no guarding.     Hernia: No hernia is present.   Musculoskeletal:        General: Normal range of motion.     Cervical back: Neck supple.     Right lower leg: No edema.     Left lower leg: No edema.  Lymphadenopathy:     Cervical: No cervical adenopathy.  Skin:    General: Skin is warm and dry.  Neurological:     General: No focal deficit present.     Mental Status: She is alert. Mental status is at baseline.  Psychiatric:        Mood and Affect: Mood normal.        Behavior: Behavior normal.     Lab Results  Component Value Date   WBC 5.2 02/16/2023  HGB 14.1 02/16/2023   HCT 43.4 02/16/2023   PLT 204.0 02/16/2023   GLUCOSE 108 (H) 02/16/2023   CHOL 185 05/19/2022   TRIG 185.0 (H) 05/19/2022   HDL 49.40 05/19/2022   LDLDIRECT 150.0 12/28/2017   LDLCALC 99 05/19/2022   ALT 14 04/09/2022   AST 19 04/09/2022   NA 139 02/16/2023   K 3.7 02/16/2023   CL 100 02/16/2023   CREATININE 0.98 02/16/2023   BUN 20 02/16/2023   CO2 31 02/16/2023   TSH 1.43 02/16/2023   INR 1.02 07/07/2017   HGBA1C 5.5 12/18/2016    VAS [place] LOWER EXTREMITY VENOUS (DVT)  Result Date: 05/14/2022  Lower Venous DVT Study Patient Name:  [name]  Date of Exam:   05/13/2022 Medical Rec #: [id number]        Accession #:    [id number] Date of Birth: July 24, 1933        Patient Gender: F Patient Age:   38 years Exam Location:  [facility] Procedure:      VAS [place] LOWER EXTREMITY VENOUS (DVT) Referring Phys: Lanora Manis PECK --------------------------------------------------------------------------------  Indications: Patient has had intermittent swelling in both legs for about 1 year. She has has atrial fibrillation with increased SOB and fatigue with this. She has had some chest tightness.  Anticoagulation: Xarelto. Comparison Study: None Performing Technologist: Alecia Mackin RVT, RDCS (AE), RDMS  Examination Guidelines: A complete evaluation includes B-mode imaging, spectral Doppler, color Doppler, and power Doppler as needed of all accessible portions of  each vessel. Bilateral testing is considered an integral part of a complete examination. Limited examinations for reoccurring indications may be performed as noted. The reflux portion of the exam is performed with the patient in reverse Trendelenburg.  +---------+---------------+---------+-----------+----------+--------------+ RIGHT    CompressibilityPhasicitySpontaneityPropertiesThrombus Aging +---------+---------------+---------+-----------+----------+--------------+ CFV      Full           Yes      Yes                                 +---------+---------------+---------+-----------+----------+--------------+ SFJ      Full           Yes      Yes                                 +---------+---------------+---------+-----------+----------+--------------+ FV Prox  Full           Yes      Yes                                 +---------+---------------+---------+-----------+----------+--------------+ FV Mid   Full           Yes      Yes                                 +---------+---------------+---------+-----------+----------+--------------+ FV DistalFull           Yes      Yes                                 +---------+---------------+---------+-----------+----------+--------------+ PFV      Full                                                        +---------+---------------+---------+-----------+----------+--------------+  POP      Full           Yes      Yes                                 +---------+---------------+---------+-----------+----------+--------------+ PTV      Full           Yes      Yes                                 +---------+---------------+---------+-----------+----------+--------------+ PERO     Full           Yes      Yes                                 +---------+---------------+---------+-----------+----------+--------------+ Gastroc  Full                                                         +---------+---------------+---------+-----------+----------+--------------+ GSV      Full           Yes      Yes                                 +---------+---------------+---------+-----------+----------+--------------+   +---------+---------------+---------+-----------+----------+--------------+ LEFT     CompressibilityPhasicitySpontaneityPropertiesThrombus Aging +---------+---------------+---------+-----------+----------+--------------+ CFV      Full           Yes      Yes                                 +---------+---------------+---------+-----------+----------+--------------+ SFJ      Full           Yes      Yes                                 +---------+---------------+---------+-----------+----------+--------------+ FV Prox  Full           Yes      Yes                                 +---------+---------------+---------+-----------+----------+--------------+ FV Mid   Full           Yes      Yes                                 +---------+---------------+---------+-----------+----------+--------------+ FV DistalFull           Yes      Yes                                 +---------+---------------+---------+-----------+----------+--------------+ PFV      Full                                                        +---------+---------------+---------+-----------+----------+--------------+  POP      Full           Yes      Yes                                 +---------+---------------+---------+-----------+----------+--------------+ PTV      Full           Yes      Yes                                 +---------+---------------+---------+-----------+----------+--------------+ PERO     Full           Yes      Yes                                 +---------+---------------+---------+-----------+----------+--------------+ Gastroc  Full                                                         +---------+---------------+---------+-----------+----------+--------------+ GSV      Full           Yes      Yes                                 +---------+---------------+---------+-----------+----------+--------------+    Findings reported to [employer] thru secure chat in EPIC at 11:45 am.  Summary: BILATERAL: - No evidence of deep vein thrombosis seen in the lower extremities, bilaterally. - RIGHT: - No cystic structure found in the popliteal fossa.  LEFT: - No cystic structure found in the popliteal fossa. - Small fluid collection medial to knee measuring 2.2 x 1.4 x .5 cm.  *See table(s) above for measurements and observations. Electronically signed by Lance Muss MD on 05/14/2022 at 9:42:05 PM.    Final     Assessment & Plan:   Dyslipidemia, goal LDL below 70- She is not willing to take a statin. -     TSH; Future  Essential hypertension- Her BP is over-controlled. Will discontinue the thiazide diuretic. 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 Will adjust the xarelto dose for renal impairment. -     TSH; Future -     Rivaroxaban; Take 1 tablet (15 mg total) by mouth daily with supper.  Dispense: 90 tablet; Refill: 0  Acute hemorrhagic otitis externa of right ear -     Neomycin-Polymyxin-HC; Place 3 drops into the right ear every 8 (eight) hours.  Dispense: 10 mL; Refill: 0     Follow-up: Return in about 3 months (around 05/19/2023).  Sanda Linger, MD

## 2023-02-23 ENCOUNTER — Telehealth: Payer: Self-pay | Admitting: Internal Medicine

## 2023-02-23 ENCOUNTER — Other Ambulatory Visit: Payer: Self-pay | Admitting: Internal Medicine

## 2023-02-23 DIAGNOSIS — I10 Essential (primary) hypertension: Secondary | ICD-10-CM

## 2023-02-23 MED ORDER — TORSEMIDE 10 MG PO TABS
10.0000 mg | ORAL_TABLET | Freq: Two times a day (BID) | ORAL | 0 refills | Status: DC
Start: 2023-02-23 — End: 2023-05-19

## 2023-02-23 NOTE — Telephone Encounter (Signed)
Pt called stating her BP levels has been high and reading at 154/78 and 150/60 and also notice swelling in her feet and legs. Please advise.

## 2023-02-23 NOTE — Telephone Encounter (Signed)
Pt has been informed and expressed understanding.  

## 2023-02-23 NOTE — Telephone Encounter (Signed)
Start demadex RX sent

## 2023-03-06 ENCOUNTER — Encounter: Payer: Self-pay | Admitting: Family Medicine

## 2023-03-06 ENCOUNTER — Telehealth (INDEPENDENT_AMBULATORY_CARE_PROVIDER_SITE_OTHER): Payer: Medicare HMO | Admitting: Family Medicine

## 2023-03-06 DIAGNOSIS — Z7901 Long term (current) use of anticoagulants: Secondary | ICD-10-CM | POA: Diagnosis not present

## 2023-03-06 DIAGNOSIS — U071 COVID-19: Secondary | ICD-10-CM

## 2023-03-06 DIAGNOSIS — I480 Paroxysmal atrial fibrillation: Secondary | ICD-10-CM | POA: Diagnosis not present

## 2023-03-06 MED ORDER — NIRMATRELVIR/RITONAVIR (PAXLOVID) TABLET (RENAL DOSING)
2.00 | ORAL_TABLET | Freq: Two times a day (BID) | ORAL | 0 refills | Status: AC
Start: 2023-03-06 — End: 2023-03-11

## 2023-03-06 NOTE — Progress Notes (Signed)
Virtual telephone visit    Virtual Visit via Telephone Note      Patient location: Home. Patient and provider in visit Provider location: Office  I discussed the limitations of evaluation and management by telemedicine and the availability of in person appointments. The patient expressed understanding and agreed to proceed.   Visit Date: 03/06/2023  Today's healthcare provider: Hetty Blend, NP-C     Subjective:    Patient ID: [name], female    DOB: Nov 24, 1932, 89 y.o.   MRN: [id number]  Chief Complaint  Patient presents with   Covid Positive    HPI  Tested positive for Covid yesterday.  2 -3 day hx of ST, rhinorrhea, congestion, headache, fatigue and low grade fever. Started coughing.   Denies dizziness, chest pain, palpitations, shortness of breath, abdominal pain, N/V/D.   She has had 3 Covid vaccines.  Never had Covid.   On Xarelto for A-fib.   Past Medical History:  Diagnosis Date   Abnormal nuclear stress test    mild anterolateral septal and inferior ischemia   Arthritis    Atherosclerosis of lower extremity with claudication (HCC) 04/17/2015   LEFT LEG   Atrial fibrillation (HCC)    Cardiac arrhythmia due to congenital heart disease    Claudication [facility])    Colon cancer (HCC)    Coronary atherosclerosis of unspecified type of vessel, native or graft    Esophageal stricture    Fatty liver 11/05/2011   Hiatal hernia    HLD (hyperlipidemia)    Hypertension    IBS (irritable bowel syndrome)    Iron deficiency anemia, unspecified    Ischemic colitis (HCC)    Neuropathy    Osteoporosis    PAF (paroxysmal atrial fibrillation) (HCC) 04/2015   Ulcerative colitis (HCC)    Unspecified vascular insufficiency of intestine     Past Surgical History:  Procedure Laterality Date   abdominoperineal resection anastomotic stricture  05/03/2007   APPENDECTOMY     CARDIAC CATHETERIZATION N/A 03/13/2015   Procedure: Left Heart Cath and Coronary  Angiography;  Surgeon: Lennette Bihari, MD;  Location: [facility] INVASIVE CV LAB;  Service: Cardiovascular;  Laterality: N/A;   CARDIOVERSION N/A 04/20/2015   Procedure: CARDIOVERSION;  Surgeon: Jake Bathe, MD;  Location: [facility] ENDOSCOPY;  Service: Cardiovascular;  Laterality: N/A;   CARDIOVERSION N/A 07/10/2017   Procedure: CARDIOVERSION;  Surgeon: Jake Bathe, MD;  Location: [facility] ENDOSCOPY;  Service: Cardiovascular;  Laterality: N/A;   COLON SURGERY     DILATION AND CURETTAGE OF UTERUS  09/01/1990   ESOPHAGOGASTRODUODENOSCOPY N/A 04/27/2014   Procedure: ESOPHAGOGASTRODUODENOSCOPY (EGD);  Surgeon: Hart Carwin, MD;  Location: [facility] ENDOSCOPY;  Service: Endoscopy;  Laterality: N/A;   Mandibular Renstruction     ROTATOR CUFF REPAIR  09/01/2004   left   Rt. Salpingo oophorectomy and cyst removal  09/02/2003   SAVORY DILATION N/A 04/27/2014   Procedure: SAVORY DILATION;  Surgeon: Hart Carwin, MD;  Location: [facility] ENDOSCOPY;  Service: Endoscopy;  Laterality: N/A;  no xray needed   sigmoid resection for invasive rectal adenocarcinoma  12/31/2006   TEE WITHOUT CARDIOVERSION N/A 04/20/2015   Procedure: TRANSESOPHAGEAL ECHOCARDIOGRAM (TEE);  Surgeon: Jake Bathe, MD;  Location: [facility] ENDOSCOPY;  Service: Cardiovascular;  Laterality: N/A;   TUBAL LIGATION     WRIST SURGERY  09/01/2006   right    Family History  Problem Relation Age of Onset   Colon cancer Father 72   Heart disease Father  Hypertension Father    Alzheimer's disease Mother    Thyroid disease Mother    Hypertension Sister    Thyroid disease Sister    Mitral valve prolapse Sister    Hypertension Son    Diabetes Son    Hypertension Son    Diabetes Son    Esophageal cancer Neg Hx    Rectal cancer Neg Hx    Stomach cancer Neg Hx     Social History   Socioeconomic History   Marital status: Widowed    Spouse name: Not on file   Number of children: 4   Years of education: 12th grade   Highest education level: Not on file   Occupational History   Occupation: retired    Associate Professor: RETIRED  Tobacco Use   Smoking status: Never    Passive exposure: Never   Smokeless tobacco: Never  Vaping Use   Vaping Use: Never used  Substance and Sexual Activity   Alcohol use: No   Drug use: No   Sexual activity: Not Currently  Other Topics Concern   Not on file  Social History Narrative   Lives at home with fiance.   Right-handed.   No caffeine use.   Social Determinants of Health   Financial Resource Strain: Low Risk  (11/13/2021)   Overall Financial Resource Strain (CARDIA)    Difficulty of Paying Living Expenses: Not hard at all  Food Insecurity: No Food Insecurity (11/13/2021)   Hunger Vital Sign    Worried About Running Out of Food in the Last Year: Never true    Ran Out of Food in the Last Year: Never true  Transportation Needs: No Transportation Needs (11/13/2021)   PRAPARE - Administrator, Civil Service (Medical): No    Lack of Transportation (Non-Medical): No  Physical Activity: Inactive (11/13/2021)   Exercise Vital Sign    Days of Exercise per Week: 0 days    Minutes of Exercise per Session: 0 min  Stress: No Stress Concern Present (11/13/2021)   [employer] of Occupational Health - Occupational Stress Questionnaire    Feeling of Stress : Not at all  Social Connections: Moderately Integrated (11/13/2021)   Social Connection and Isolation Panel [NHANES]    Frequency of Communication with Friends and Family: More than three times a week    Frequency of Social Gatherings with Friends and Family: More than three times a week    Attends Religious Services: More than 4 times per year    Active Member of [employer] or Organizations: Yes    Attends Banker Meetings: More than 4 times per year    Marital Status: Widowed  Intimate Partner Violence: Not At Risk (11/13/2021)   Humiliation, Afraid, Rape, and Kick questionnaire    Fear of Current or Ex-Partner: No    Emotionally Abused:  No    Physically Abused: No    Sexually Abused: No    Outpatient Medications Prior to Visit  Medication Sig Dispense Refill   acetaminophen (TYLENOL) 500 MG tablet Take 1,000 mg by mouth every 6 (six) hours as needed for headache (pain).     Calcium Carbonate-Vitamin D (CALCIUM-D PO) Take 1 tablet by mouth at bedtime. Calcium 630 mg, Vitamin D3 500 units     cyanocobalamin 2000 MCG tablet Take 1 tablet (2,000 mcg total) by mouth every other day. (Patient taking differently: Take 2,000 mcg by mouth daily.) 45 tablet 1   diclofenac sodium (VOLTAREN) 1 % GEL Apply 2 g topically  4 (four) times daily. 100 g 1   Dietary Management Product (FOSTEUM PLUS) CAPS Take 1 capsule by mouth 2 (two) times daily. 180 capsule 2   diphenoxylate-atropine (LOMOTIL) 2.5-0.025 MG tablet Take 1 tablet by mouth 4 (four) times daily as needed for diarrhea or loose stools. 30 tablet 0   famotidine (PEPCID) 40 MG tablet Take 1 tablet (40 mg total) by mouth at bedtime. 90 tablet 1   finasteride (PROPECIA) 1 MG tablet TAKE 1 TABLET AT BEDTIME 90 tablet 0   gabapentin (NEURONTIN) 100 MG capsule Take 1 capsule (100 mg total) by mouth 3 (three) times daily. 270 capsule 1   metoprolol tartrate (LOPRESSOR) 25 MG tablet TAKE 1 AND 1/2 TABLETS IN THE MORNING AND TAKE 1 TABLET AT BEDTIME 225 tablet 3   NEOMYCIN-POLYMYXIN-HYDROCORTISONE (CORTISPORIN) 1 % SOLN OTIC solution Place 3 drops into the right ear every 8 (eight) hours. 10 mL 0   Peppermint Oil (IBGARD) 90 MG CPCR 1 capsule three times daily as needed     polyethylene glycol powder (MIRALAX) 17 GM/SCOOP powder 1/2 capful daily. 255 g 0   potassium chloride (KLOR-CON M) 10 MEQ tablet TAKE 2 TABLETS DAILY (DOSE INCREASED DUE TO HYPOKALEMIA) 180 tablet 3   Rivaroxaban (XARELTO) 15 MG TABS tablet Take 1 tablet (15 mg total) by mouth daily with supper. 90 tablet 0   sotalol (BETAPACE) 120 MG tablet TAKE 1 TABLET (120 MG TOTAL) BY MOUTH EVERY 12 (TWELVE) HOURS. 180 tablet 3    torsemide (DEMADEX) 10 MG tablet Take 1 tablet (10 mg total) by mouth 2 (two) times daily. 180 tablet 0   traMADol (ULTRAM) 50 MG tablet TAKE 1 TABLET BY MOUTH EVERY 6 HOURS AS NEEDED 90 tablet 5   Wheat Dextrin (BENEFIBER) POWD 1 tablespoon 2 to 3 times daily with meals (Patient taking differently: Take by mouth daily in the afternoon. 1 tablespoon 2 to 3 times daily with meals) 30 g 0   No facility-administered medications prior to visit.    Allergies  Allergen Reactions   Crestor [Rosuvastatin Calcium] Other (See Comments)    Muscle aches   Acetaminophen-Codeine Other (See Comments)    Unknown reaction - possibly nausea   Amlodipine Besylate Swelling    Hands and feet swelling   Hydrocodone-Acetaminophen Nausea And Vomiting   Irbesartan-Hydrochlorothiazide Other (See Comments)    REACTION: Dizziness   Lisinopril Cough   Omeprazole Nausea Only and Other (See Comments)    Dizziness, joint pain, weakness in legs, cramps in legs, stomach burned   Propoxyphene N-Acetaminophen Other (See Comments)    Headache, and blotchy face   Valsartan Other (See Comments)    REACTION: blurred vision   Warfarin Sodium Other (See Comments)    REACTION: body aches and bleeding   Diltiazem Hcl Rash   Verapamil Nausea Only, Palpitations and Other (See Comments)    Headaches     ROS     Objective:    Physical Exam  There were no vitals taken for this visit. Wt Readings from Last 3 Encounters:  02/16/23 190 lb (86.2 kg)  10/31/22 193 lb (87.5 kg)  10/23/22 194 lb 3.2 oz (88.1 kg)   Alert and in no acute distress.  Speaking in complete sentences without difficulty.      Assessment & Plan:   Problem List Items Addressed This Visit       Cardiovascular and Mediastinum   PAF (paroxysmal atrial fibrillation) (HCC)   Other Visit Diagnoses     COVID-19  virus infection    -  Primary   Relevant Medications   nirmatrelvir/ritonavir, renal dosing, (PAXLOVID) 10 x 150 MG & 10 x 100MG  TABS    On anticoagulant therapy          Unable to do video visit. Changed to telephone call.  Current symptoms are mild.  Discussed that I will send in Paxlovid, renal dosed, for her to start in the next 24 to 48 hours if she is worsening.  She will need to stop Xarelto while on the Paxlovid.  Discussed symptomatic treatment over-the-counter.  Counseling on CDC guidelines for quarantine and isolation.  I am having Yuna Golias. Allum start on nirmatrelvir/ritonavir (renal dosing). I am also having her maintain her Calcium Carbonate-Vitamin D (CALCIUM-D PO), acetaminophen, diclofenac sodium, cyanocobalamin, traMADol, diphenoxylate-atropine, gabapentin, Benefiber, polyethylene glycol powder, IBgard, sotalol, potassium chloride, famotidine, metoprolol tartrate, Fosteum Plus, finasteride, NEOMYCIN-POLYMYXIN-HYDROCORTISONE, Rivaroxaban, and torsemide.  Meds ordered this encounter  Medications   nirmatrelvir/ritonavir, renal dosing, (PAXLOVID) 10 x 150 MG & 10 x 100MG  TABS    Sig: Take 2 tablets by mouth 2 (two) times daily for 5 days. (Take nirmatrelvir 150 mg one tablet twice daily for 5 days and ritonavir 100 mg one tablet twice daily for 5 days) Patient GFR is 56. HOLD XARELTO IF TAKING THIS MEDICATION    Dispense:  20 tablet    Refill:  0    Order Specific Question:   Supervising Provider    Answer:   Hillard Danker A [[id number]]     I discussed the assessment and treatment plan with the patient. The patient was provided an opportunity to ask questions and all were answered. The patient agreed with the plan and demonstrated an understanding of the instructions.   The patient was advised to call back or seek an in-person evaluation if the symptoms worsen or if the condition fails to improve as anticipated.  I provided 14 minutes of non-face-to-face time during this encounter.   Hetty Blend, NP-C [facility] [phone] (phone) [phone] (fax)  [facility]

## 2023-03-13 ENCOUNTER — Ambulatory Visit
Admission: RE | Admit: 2023-03-13 | Discharge: 2023-03-13 | Disposition: A | Discharge status: Home / Self Care | Payer: Medicare HMO | Source: Ambulatory Visit | Attending: Internal Medicine | Admitting: Internal Medicine

## 2023-03-13 ENCOUNTER — Ambulatory Visit: Payer: Medicare HMO

## 2023-03-13 ENCOUNTER — Other Ambulatory Visit: Payer: Self-pay

## 2023-03-13 VITALS — BP 153/91 | HR 62 | Temp 98.1°F | Resp 18 | Ht 66.0 in | Wt 180.0 lb

## 2023-03-13 DIAGNOSIS — M79672 Pain in left foot: Secondary | ICD-10-CM

## 2023-03-13 DIAGNOSIS — M109 Gout, unspecified: Secondary | ICD-10-CM | POA: Diagnosis not present

## 2023-03-13 DIAGNOSIS — K130 Diseases of lips: Secondary | ICD-10-CM

## 2023-03-13 DIAGNOSIS — M7989 Other specified soft tissue disorders: Secondary | ICD-10-CM | POA: Diagnosis not present

## 2023-03-13 MED ORDER — PREDNISONE 20 MG PO TABS: 40.0000 mg | ORAL_TABLET | Freq: Every day | ORAL | 0 refills | Status: AC

## 2023-03-13 NOTE — Discharge Instructions (Signed)
It appears that you have gout.  I have prescribed you prednisone steroid to take for this.  Recommend following up with PCP for lip lesion.  Follow-up if any foot symptoms persist or worsen.

## 2023-03-13 NOTE — ED Provider Notes (Signed)
[facility]    CSN: [id number] Arrival date & time: 03/13/23  1630      History   Chief Complaint Chief Complaint  Patient presents with   Foot Pain    Left. No injury known.     HPI [name] is a 89 y.o. female.   Patient presents with pain in left big toe that started today.  Denies any injury to the area.  Denies any numbness or tingling.  Denies history of gout or chronic pain in the foot.  Has taken Tylenol with minimal improvement.  Reports that she is concerned that it could be related to medication change as she was recently changed from chlorthalidone to torsemide.  Also complaining of a "blood blister" to right upper lip that she noticed this today directly prior to arrival to urgent care.  Denies injury to the area.  She does take Xarelto so is concerned it could be related to this. Denies that it is painful or bothersome.    Foot Pain    Past Medical History:  Diagnosis Date   Abnormal nuclear stress test    mild anterolateral septal and inferior ischemia   Arthritis    Atherosclerosis of lower extremity with claudication (HCC) 04/17/2015   LEFT LEG   Atrial fibrillation (HCC)    Cardiac arrhythmia due to congenital heart disease    Claudication [facility])    Colon cancer (HCC)    Coronary atherosclerosis of unspecified type of vessel, native or graft    Esophageal stricture    Fatty liver 11/05/2011   Hiatal hernia    HLD (hyperlipidemia)    Hypertension    IBS (irritable bowel syndrome)    Iron deficiency anemia, unspecified    Ischemic colitis (HCC)    Neuropathy    Osteoporosis    PAF (paroxysmal atrial fibrillation) (HCC) 04/2015   Ulcerative colitis (HCC)    Unspecified vascular insufficiency of intestine     Patient Active Problem List   Diagnosis Date Noted   Encounter for general adult medical examination with abnormal findings 05/15/2021   Essential hypertension 05/15/2021   Primary osteoarthritis involving multiple joints  01/26/2020   Frontal balding 04/19/2018   Anticoagulated 07/08/2017   Neuromyelopathy due to vitamin B12 deficiency (HCC) 12/01/2016   PAF (paroxysmal atrial fibrillation) (HCC) 04/2015   GERD (gastroesophageal reflux disease) 02/25/2015   Stricture and stenosis of esophagus 04/27/2014   Rectal cancer (HCC) 10/01/2011   CROHN'S DISEASE, LARGE INTESTINE 01/09/2009   Dyslipidemia, goal LDL below 70 11/08/2007   MITRAL REGURGITATION, 0 (MILD) 11/08/2007   Osteoporosis 11/08/2007    Past Surgical History:  Procedure Laterality Date   abdominoperineal resection anastomotic stricture  05/03/2007   APPENDECTOMY     CARDIAC CATHETERIZATION N/A 03/13/2015   Procedure: Left Heart Cath and Coronary Angiography;  Surgeon: Lennette Bihari, MD;  Location: [facility] INVASIVE CV LAB;  Service: Cardiovascular;  Laterality: N/A;   CARDIOVERSION N/A 04/20/2015   Procedure: CARDIOVERSION;  Surgeon: Jake Bathe, MD;  Location: [facility] ENDOSCOPY;  Service: Cardiovascular;  Laterality: N/A;   CARDIOVERSION N/A 07/10/2017   Procedure: CARDIOVERSION;  Surgeon: Jake Bathe, MD;  Location: [facility] ENDOSCOPY;  Service: Cardiovascular;  Laterality: N/A;   COLON SURGERY     DILATION AND CURETTAGE OF UTERUS  09/01/1990   ESOPHAGOGASTRODUODENOSCOPY N/A 04/27/2014   Procedure: ESOPHAGOGASTRODUODENOSCOPY (EGD);  Surgeon: Hart Carwin, MD;  Location: [facility] ENDOSCOPY;  Service: Endoscopy;  Laterality: N/A;   Mandibular Renstruction  ROTATOR CUFF REPAIR  09/01/2004   left   Rt. Salpingo oophorectomy and cyst removal  09/02/2003   SAVORY DILATION N/A 04/27/2014   Procedure: SAVORY DILATION;  Surgeon: Hart Carwin, MD;  Location: [facility] ENDOSCOPY;  Service: Endoscopy;  Laterality: N/A;  no xray needed   sigmoid resection for invasive rectal adenocarcinoma  12/31/2006   TEE WITHOUT CARDIOVERSION N/A 04/20/2015   Procedure: TRANSESOPHAGEAL ECHOCARDIOGRAM (TEE);  Surgeon: Jake Bathe, MD;  Location: [facility] ENDOSCOPY;  Service:  Cardiovascular;  Laterality: N/A;   TUBAL LIGATION     WRIST SURGERY  09/01/2006   right    OB History   No obstetric history on file.      Home Medications    Prior to Admission medications   Medication Sig Start Date End Date Taking? Authorizing Provider  acetaminophen (TYLENOL) 500 MG tablet Take 1,000 mg by mouth every 6 (six) hours as needed for headache (pain).   Yes [provider]  Calcium Carbonate-Vitamin D (CALCIUM-D PO) Take 1 tablet by mouth at bedtime. Calcium 630 mg, Vitamin D3 500 units   Yes [provider]  cyanocobalamin 2000 MCG tablet Take 1 tablet (2,000 mcg total) by mouth every other day. Patient taking differently: Take 2,000 mcg by mouth daily. 01/27/20  Yes Etta Grandchild, MD  diphenoxylate-atropine (LOMOTIL) 2.5-0.025 MG tablet Take 1 tablet by mouth 4 (four) times daily as needed for diarrhea or loose stools. 04/09/22  Yes Doree Albee, PA-C  famotidine (PEPCID) 40 MG tablet Take 1 tablet (40 mg total) by mouth at bedtime. 10/20/22  Yes Napoleon Form, MD  finasteride (PROPECIA) 1 MG tablet TAKE 1 TABLET AT BEDTIME 01/05/23  Yes Etta Grandchild, MD  gabapentin (NEURONTIN) 100 MG capsule Take 1 capsule (100 mg total) by mouth 3 (three) times daily. 05/20/22  Yes Etta Grandchild, MD  metoprolol tartrate (LOPRESSOR) 25 MG tablet TAKE 1 AND 1/2 TABLETS IN THE MORNING AND TAKE 1 TABLET AT BEDTIME 11/04/22  Yes Lennette Bihari, MD  Peppermint Oil (IBGARD) 90 MG CPCR 1 capsule three times daily as needed 06/19/22  Yes Nandigam, Eleonore Chiquito, MD  potassium chloride (KLOR-CON M) 10 MEQ tablet TAKE 2 TABLETS DAILY (DOSE INCREASED DUE TO HYPOKALEMIA) 09/15/22  Yes Lennette Bihari, MD  predniSONE (DELTASONE) 20 MG tablet Take 2 tablets (40 mg total) by mouth daily for 5 days. 03/13/23 03/18/23 Yes , Acie Fredrickson, FNP  Rivaroxaban (XARELTO) 15 MG TABS tablet Take 1 tablet (15 mg total) by mouth daily with supper. 02/16/23  Yes Etta Grandchild, MD  sotalol  (BETAPACE) 120 MG tablet TAKE 1 TABLET (120 MG TOTAL) BY MOUTH EVERY 12 (TWELVE) HOURS. 07/15/22  Yes Lennette Bihari, MD  torsemide (DEMADEX) 10 MG tablet Take 1 tablet (10 mg total) by mouth 2 (two) times daily. 02/23/23  Yes Etta Grandchild, MD  traMADol (ULTRAM) 50 MG tablet TAKE 1 TABLET BY MOUTH EVERY 6 HOURS AS NEEDED 12/20/20  Yes Etta Grandchild, MD  diclofenac sodium (VOLTAREN) 1 % GEL Apply 2 g topically 4 (four) times daily. 04/20/19   Antony Madura, PA-C  Dietary Management Product (FOSTEUM PLUS) CAPS Take 1 capsule by mouth 2 (two) times daily. 11/17/22   Etta Grandchild, MD  NEOMYCIN-POLYMYXIN-HYDROCORTISONE (CORTISPORIN) 1 % SOLN OTIC solution Place 3 drops into the right ear every 8 (eight) hours. 02/16/23   Etta Grandchild, MD  polyethylene glycol powder (MIRALAX) 17 GM/SCOOP powder 1/2 capful daily. 06/19/22  Napoleon Form, MD  Wheat Dextrin (BENEFIBER) POWD 1 tablespoon 2 to 3 times daily with meals Patient taking differently: Take by mouth daily in the afternoon. 1 tablespoon 2 to 3 times daily with meals 06/19/22   Napoleon Form, MD    Family History Family History  Problem Relation Age of Onset   Colon cancer Father 103   Heart disease Father    Hypertension Father    Alzheimer's disease Mother    Thyroid disease Mother    Hypertension Sister    Thyroid disease Sister    Mitral valve prolapse Sister    Hypertension Son    Diabetes Son    Hypertension Son    Diabetes Son    Esophageal cancer Neg Hx    Rectal cancer Neg Hx    Stomach cancer Neg Hx     Social History Social History   Tobacco Use   Smoking status: Never    Passive exposure: Never   Smokeless tobacco: Never  Vaping Use   Vaping status: Never Used  Substance Use Topics   Alcohol use: No   Drug use: No     Allergies   Crestor [rosuvastatin calcium], Acetaminophen-codeine, Amlodipine besylate, Hydrocodone-acetaminophen, Irbesartan-hydrochlorothiazide, Lisinopril, Omeprazole,  Propoxyphene n-acetaminophen, Valsartan, Warfarin sodium, Diltiazem hcl, and Verapamil   Review of Systems Review of Systems Per HPI  Physical Exam Triage Vital Signs ED Triage Vitals  Encounter Vitals Group     BP 03/13/23 1642 (!) 156/82     Systolic BP Percentile --      Diastolic BP Percentile --      Pulse Rate 03/13/23 1642 62     Resp 03/13/23 1642 18     Temp 03/13/23 1642 98.1 F (36.7 C)     Temp Source 03/13/23 1642 Oral     SpO2 03/13/23 1642 96 %     Weight 03/13/23 1639 180 lb (81.6 kg)     Height 03/13/23 1639 5\' 6"  (1.676 m)     Head Circumference --      Peak Flow --      Pain Score 03/13/23 1638 8     Pain Loc --      Pain Education --      Exclude from Growth Chart --    No data found.  Updated Vital Signs BP (!) 153/91 (BP Location: Left Arm)   Pulse 62   Temp 98.1 F (36.7 C) (Oral)   Resp 18   Ht 5\' 6"  (1.676 m)   Wt 180 lb (81.6 kg)   SpO2 95%   BMI 29.05 kg/m   Visual Acuity Right Eye Distance:   Left Eye Distance:   Bilateral Distance:    Right Eye Near:   Left Eye Near:    Bilateral Near:     Physical Exam Constitutional:      General: She is not in acute distress.    Appearance: Normal appearance. She is not toxic-appearing or diaphoretic.  HENT:     Head: Normocephalic and atraumatic.     Mouth/Throat:     Comments: Approximately 1 cm in diameter bruised like area present to right upper lip with no drainage noted.  No significant or surrounding swelling. Eyes:     Extraocular Movements: Extraocular movements intact.     Conjunctiva/sclera: Conjunctivae normal.  Pulmonary:     Effort: Pulmonary effort is normal.  Feet:     Comments: Has significant tenderness to palpation with associated mild swelling and erythema present to  left MTP joint of left great toe.  No warmth noted.  Patient wiggle toe.  Capillary refill and pulses intact.  No abrasions or lacerations noted. Neurological:     General: No focal deficit present.      Mental Status: She is alert and oriented to person, place, and time. Mental status is at baseline.  Psychiatric:        Mood and Affect: Mood normal.        Behavior: Behavior normal.        Thought Content: Thought content normal.        Judgment: Judgment normal.      UC Treatments / Results  Labs (all labs ordered are listed, but only abnormal results are displayed) Labs Reviewed - No data to display  EKG   Radiology DG Foot Complete Left  Result Date: 03/13/2023 CLINICAL DATA:  Exceedingly painful left foot. EXAM: LEFT FOOT - COMPLETE 3+ VIEW COMPARISON:  None Available. FINDINGS: There is no evidence of fracture or dislocation. Increased density and swelling are noted adjacent to the first metatarsophalangeal joint. Mild osteophytosis but no subchondral erosions are seen associated with this joint. IMPRESSION: 1. No acute fracture or dislocation identified about the left foot. 2. Increased density and swelling adjacent to the first metatarsophalangeal joint. This may potentially represent early gout arthritis. Please correlate clinically. Electronically Signed   By: Ted Mcalpine M.D.   On: 03/13/2023 17:22    Procedures Procedures (including critical care time)  Medications Ordered in UC Medications - No data to display  Initial Impression / Assessment and Plan / UC Course  I have reviewed the triage vital signs and the nursing notes.  Pertinent labs & imaging results that were available during my care of the patient were reviewed by me and considered in my medical decision making (see chart for details).     1.  Gout of left toe X-ray is concerning for gout.  Will treat with steroid burst.  Patient's latest EF is 50 to 55% so this should be safe as there appears to be no other contraindications to prednisone noted in patient's history.  She does take torsemide so it is suspicious that this could be cause of new onset gout given it is a new medication per patient  report.  Advised patient to discuss this with her cardiologist who prescribes this.  Also advised PCP follow-up for recheck of labs given that prednisone used in conjunction with torsemide can cause potassium depletion.  Patient denies that she has glaucoma or uses medication for this sop prednisone should be safe for this as well especially used in the short course.  Advised her to follow-up if pain persists or worsens.  2.  Lip lesion  Lip lesion appears consistent with possible small hematoma.  Given no injury, imaging was deferred.  Advised supportive care, ice application, following up with PCP for this.  No need for emergent evaluation at this time.  Patient and daughter verbalized understanding and were agreeable with plan. Final Clinical Impressions(s) / UC Diagnoses   Final diagnoses:  Acute gout involving toe of left foot, unspecified cause  Left foot pain  Lip lesion     Discharge Instructions      It appears that you have gout.  I have prescribed you prednisone steroid to take for this.  Recommend following up with PCP for lip lesion.  Follow-up if any foot symptoms persist or worsen.     ED Prescriptions     Medication  Sig Dispense Auth. Provider   predniSONE (DELTASONE) 20 MG tablet Take 2 tablets (40 mg total) by mouth daily for 5 days. 10 tablet Gustavus Bryant, [place]      PDMP not reviewed this encounter.   Gustavus Bryant, [place] 03/13/23 1757

## 2023-03-13 NOTE — ED Triage Notes (Signed)
extremely painful  cant move big toe   some swelling on top of foot and redness on the side - Entered by patient

## 2023-03-15 ENCOUNTER — Ambulatory Visit
Admission: EM | Admit: 2023-03-15 | Discharge: 2023-03-15 | Disposition: A | Discharge status: Home / Self Care | Payer: Medicare HMO

## 2023-03-15 ENCOUNTER — Other Ambulatory Visit: Payer: Self-pay

## 2023-03-15 ENCOUNTER — Encounter: Payer: Self-pay | Admitting: Emergency Medicine

## 2023-03-15 DIAGNOSIS — I10 Essential (primary) hypertension: Secondary | ICD-10-CM

## 2023-03-15 DIAGNOSIS — T380X5A Adverse effect of glucocorticoids and synthetic analogues, initial encounter: Secondary | ICD-10-CM | POA: Diagnosis not present

## 2023-03-15 DIAGNOSIS — T50905A Adverse effect of unspecified drugs, medicaments and biological substances, initial encounter: Secondary | ICD-10-CM

## 2023-03-15 NOTE — Discharge Instructions (Addendum)
I believe your symptoms are related to the use of prednisone which is a steroid I believe this is increasing your blood pressure and your heart rate and may send you into an arrhythmia therefore stop use of this medicine  Unfortunately we are unable to use anti-inflammatory medicine due to your use of a blood thinner and we are limited on medicine to treat your gout  Apply diclofenac cream every 6 hours consistently over the next few days to help reduce inflammation over the joint which ideally will start to reduce swelling and help with your discomfort  Continue to take Tylenol every 6 hours to help minimize pain, may continue to take tramadol for severe pain  May apply ice or heat over the affected area in 10 to 15-minute intervals, whichever feels the best  Elevate whenever sitting and lying to help reduce swelling  Another alternative could be an injection into the joint to help reduce swelling and pain, please reach out to your orthopedic doctor on Monday to see if this is a possibility

## 2023-03-15 NOTE — ED Triage Notes (Signed)
Pt was at home and has been feeling different since she was placed on prednisone for her gout. Today she had higher pressures and her heart rate was different. Pt brought back and pressures had dropped.

## 2023-03-15 NOTE — ED Provider Notes (Signed)
[facility]    CSN: [id number] Arrival date & time: 03/15/23  0920      History   Chief Complaint No chief complaint on file.   HPI [name] is a 89 y.o. female.   Patient presents for evaluation of elevated blood pressure that started last night, blood pressure reading 175/89 this morning, typically in the 140s over 80s.  Last night had 1 episode in which she had tachycardia felt as if her heart was going into an arrhythmia, begin to take slow deep breaths and symptoms resolved.  2 days ago was evaluated for gout which she believes is related to change of diuretic from chlorthalidone to torsemide, change occurred in June 2024, has been experiencing decreased urination and dry mouth but has not experienced leg swelling or weight gain.  Started on prednisone, took 1 dose yesterday, has seen some improvement in count.  12 days post COVID did not use Paxlovid due to Xarelto, still experiencing mild congestion but overall symptoms had improved.  For management of gout additionally has taken Tylenol.    Past Medical History:  Diagnosis Date   Abnormal nuclear stress test    mild anterolateral septal and inferior ischemia   Arthritis    Atherosclerosis of lower extremity with claudication (HCC) 04/17/2015   LEFT LEG   Atrial fibrillation (HCC)    Cardiac arrhythmia due to congenital heart disease    Claudication [facility])    Colon cancer (HCC)    Coronary atherosclerosis of unspecified type of vessel, native or graft    Esophageal stricture    Fatty liver 11/05/2011   Hiatal hernia    HLD (hyperlipidemia)    Hypertension    IBS (irritable bowel syndrome)    Iron deficiency anemia, unspecified    Ischemic colitis (HCC)    Neuropathy    Osteoporosis    PAF (paroxysmal atrial fibrillation) (HCC) 04/2015   Ulcerative colitis (HCC)    Unspecified vascular insufficiency of intestine     Patient Active Problem List   Diagnosis Date Noted   Encounter for general  adult medical examination with abnormal findings 05/15/2021   Essential hypertension 05/15/2021   Primary osteoarthritis involving multiple joints 01/26/2020   Frontal balding 04/19/2018   Anticoagulated 07/08/2017   Neuromyelopathy due to vitamin B12 deficiency (HCC) 12/01/2016   PAF (paroxysmal atrial fibrillation) (HCC) 04/2015   GERD (gastroesophageal reflux disease) 02/25/2015   Stricture and stenosis of esophagus 04/27/2014   Rectal cancer (HCC) 10/01/2011   CROHN'S DISEASE, LARGE INTESTINE 01/09/2009   Dyslipidemia, goal LDL below 70 11/08/2007   MITRAL REGURGITATION, 0 (MILD) 11/08/2007   Osteoporosis 11/08/2007    Past Surgical History:  Procedure Laterality Date   abdominoperineal resection anastomotic stricture  05/03/2007   APPENDECTOMY     CARDIAC CATHETERIZATION N/A 03/13/2015   Procedure: Left Heart Cath and Coronary Angiography;  Surgeon: Lennette Bihari, MD;  Location: [facility] INVASIVE CV LAB;  Service: Cardiovascular;  Laterality: N/A;   CARDIOVERSION N/A 04/20/2015   Procedure: CARDIOVERSION;  Surgeon: Jake Bathe, MD;  Location: [facility] ENDOSCOPY;  Service: Cardiovascular;  Laterality: N/A;   CARDIOVERSION N/A 07/10/2017   Procedure: CARDIOVERSION;  Surgeon: Jake Bathe, MD;  Location: [facility] ENDOSCOPY;  Service: Cardiovascular;  Laterality: N/A;   COLON SURGERY     DILATION AND CURETTAGE OF UTERUS  09/01/1990   ESOPHAGOGASTRODUODENOSCOPY N/A 04/27/2014   Procedure: ESOPHAGOGASTRODUODENOSCOPY (EGD);  Surgeon: Hart Carwin, MD;  Location: [facility] ENDOSCOPY;  Service: Endoscopy;  Laterality: N/A;  Mandibular Renstruction     ROTATOR CUFF REPAIR  09/01/2004   left   Rt. Salpingo oophorectomy and cyst removal  09/02/2003   SAVORY DILATION N/A 04/27/2014   Procedure: SAVORY DILATION;  Surgeon: Hart Carwin, MD;  Location: [facility] ENDOSCOPY;  Service: Endoscopy;  Laterality: N/A;  no xray needed   sigmoid resection for invasive rectal adenocarcinoma  12/31/2006   TEE WITHOUT  CARDIOVERSION N/A 04/20/2015   Procedure: TRANSESOPHAGEAL ECHOCARDIOGRAM (TEE);  Surgeon: Jake Bathe, MD;  Location: [facility] ENDOSCOPY;  Service: Cardiovascular;  Laterality: N/A;   TUBAL LIGATION     WRIST SURGERY  09/01/2006   right    OB History   No obstetric history on file.      Home Medications    Prior to Admission medications   Medication Sig Start Date End Date Taking? Authorizing Provider  acetaminophen (TYLENOL) 500 MG tablet Take 1,000 mg by mouth every 6 (six) hours as needed for headache (pain).    [provider]  Calcium Carbonate-Vitamin D (CALCIUM-D PO) Take 1 tablet by mouth at bedtime. Calcium 630 mg, Vitamin D3 500 units    [provider]  cyanocobalamin 2000 MCG tablet Take 1 tablet (2,000 mcg total) by mouth every other day. Patient taking differently: Take 2,000 mcg by mouth daily. 01/27/20   Etta Grandchild, MD  diclofenac sodium (VOLTAREN) 1 % GEL Apply 2 g topically 4 (four) times daily. 04/20/19   Antony Madura, PA-C  Dietary Management Product (FOSTEUM PLUS) CAPS Take 1 capsule by mouth 2 (two) times daily. 11/17/22   Etta Grandchild, MD  diphenoxylate-atropine (LOMOTIL) 2.5-0.025 MG tablet Take 1 tablet by mouth 4 (four) times daily as needed for diarrhea or loose stools. 04/09/22   Doree Albee, PA-C  famotidine (PEPCID) 40 MG tablet Take 1 tablet (40 mg total) by mouth at bedtime. 10/20/22   Napoleon Form, MD  finasteride (PROPECIA) 1 MG tablet TAKE 1 TABLET AT BEDTIME 01/05/23   Etta Grandchild, MD  gabapentin (NEURONTIN) 100 MG capsule Take 1 capsule (100 mg total) by mouth 3 (three) times daily. 05/20/22   Etta Grandchild, MD  metoprolol tartrate (LOPRESSOR) 25 MG tablet TAKE 1 AND 1/2 TABLETS IN THE MORNING AND TAKE 1 TABLET AT BEDTIME 11/04/22   Lennette Bihari, MD  NEOMYCIN-POLYMYXIN-HYDROCORTISONE (CORTISPORIN) 1 % SOLN OTIC solution Place 3 drops into the right ear every 8 (eight) hours. 02/16/23   Etta Grandchild, MD  Peppermint  Oil (IBGARD) 90 MG CPCR 1 capsule three times daily as needed 06/19/22   Nandigam, Eleonore Chiquito, MD  polyethylene glycol powder (MIRALAX) 17 GM/SCOOP powder 1/2 capful daily. 06/19/22   Napoleon Form, MD  potassium chloride (KLOR-CON M) 10 MEQ tablet TAKE 2 TABLETS DAILY (DOSE INCREASED DUE TO HYPOKALEMIA) 09/15/22   Lennette Bihari, MD  predniSONE (DELTASONE) 20 MG tablet Take 2 tablets (40 mg total) by mouth daily for 5 days. 03/13/23 03/18/23  Gustavus Bryant, FNP  Rivaroxaban (XARELTO) 15 MG TABS tablet Take 1 tablet (15 mg total) by mouth daily with supper. 02/16/23   Etta Grandchild, MD  sotalol (BETAPACE) 120 MG tablet TAKE 1 TABLET (120 MG TOTAL) BY MOUTH EVERY 12 (TWELVE) HOURS. 07/15/22   Lennette Bihari, MD  torsemide (DEMADEX) 10 MG tablet Take 1 tablet (10 mg total) by mouth 2 (two) times daily. 02/23/23   Etta Grandchild, MD  traMADol (ULTRAM) 50 MG tablet TAKE 1 TABLET BY MOUTH EVERY  6 HOURS AS NEEDED 12/20/20   Etta Grandchild, MD  Wheat Dextrin (BENEFIBER) POWD 1 tablespoon 2 to 3 times daily with meals Patient taking differently: Take by mouth daily in the afternoon. 1 tablespoon 2 to 3 times daily with meals 06/19/22   Napoleon Form, MD    Family History Family History  Problem Relation Age of Onset   Colon cancer Father 16   Heart disease Father    Hypertension Father    Alzheimer's disease Mother    Thyroid disease Mother    Hypertension Sister    Thyroid disease Sister    Mitral valve prolapse Sister    Hypertension Son    Diabetes Son    Hypertension Son    Diabetes Son    Esophageal cancer Neg Hx    Rectal cancer Neg Hx    Stomach cancer Neg Hx     Social History Social History   Tobacco Use   Smoking status: Never    Passive exposure: Never   Smokeless tobacco: Never  Vaping Use   Vaping status: Never Used  Substance Use Topics   Alcohol use: No   Drug use: No     Allergies   Crestor [rosuvastatin calcium], Acetaminophen-codeine, Amlodipine  besylate, Hydrocodone-acetaminophen, Irbesartan-hydrochlorothiazide, Lisinopril, Omeprazole, Propoxyphene n-acetaminophen, Valsartan, Warfarin sodium, Diltiazem hcl, and Verapamil   Review of Systems Review of Systems Defer to [facility]    Physical Exam Triage Vital Signs ED Triage Vitals  Encounter Vitals Group     BP      Systolic BP Percentile      Diastolic BP Percentile      Pulse      Resp      Temp      Temp src      SpO2      Weight      Height      Head Circumference      Peak Flow      Pain Score      Pain Loc      Pain Education      Exclude from Growth Chart    No data found.  Updated Vital Signs There were no vitals taken for this visit.  Visual Acuity Right Eye Distance:   Left Eye Distance:   Bilateral Distance:    Right Eye Near:   Left Eye Near:    Bilateral Near:     Physical Exam Constitutional:      Appearance: Normal appearance.  Eyes:     Extraocular Movements: Extraocular movements intact.  Cardiovascular:     Rate and Rhythm: Normal rate and regular rhythm.     Pulses: Normal pulses.     Heart sounds: Normal heart sounds.  Pulmonary:     Effort: Pulmonary effort is normal.     Breath sounds: Normal breath sounds.  Skin:    Comments: Erythema and mild swelling present to left great toe  Neurological:     Mental Status: She is alert and oriented to person, place, and time. Mental status is at baseline.      UC Treatments / Results  Labs (all labs ordered are listed, but only abnormal results are displayed) Labs Reviewed - No data to display  EKG   Radiology DG Foot Complete Left  Result Date: 03/13/2023 CLINICAL DATA:  Exceedingly painful left foot. EXAM: LEFT FOOT - COMPLETE 3+ VIEW COMPARISON:  None Available. FINDINGS: There is no evidence of fracture or dislocation. Increased density and swelling are  noted adjacent to the first metatarsophalangeal joint. Mild osteophytosis but no subchondral erosions are seen associated  with this joint. IMPRESSION: 1. No acute fracture or dislocation identified about the left foot. 2. Increased density and swelling adjacent to the first metatarsophalangeal joint. This may potentially represent early gout arthritis. Please correlate clinically. Electronically Signed   By: Ted Mcalpine M.D.   On: 03/13/2023 17:22    Procedures Procedures (including critical care time)  Medications Ordered in UC Medications - No data to display  Initial Impression / Assessment and Plan / UC Course  I have reviewed the triage vital signs and the nursing notes.  Pertinent labs & imaging results that were available during my care of the patient were reviewed by me and considered in my medical decision making (see chart for details).  Elevated blood pressure reading with diagnosis of hypertension, medication reaction, initial encounter  Believe symptoms today are related to use of prednisone causing elevation in blood pressure and an episode of tachycardia, do believe continued use may send patient into A-fib RVR, EKG showing sinus bradycardia at 54, stable for outpatient management, discussed this with patient and family, advise discontinuation, for management of gout recommended consistent use of diclofenac and tramadol, heat elevation and activity as tolerated, recommended notification of orthopedic doctor for possible joint injection as an alternative treatment option Final Clinical Impressions(s) / UC Diagnoses   Final diagnoses:  None   Discharge Instructions   None    ED Prescriptions   None    PDMP not reviewed this encounter.   Valinda Hoar, [place] 03/16/23 [phone]

## 2023-03-23 ENCOUNTER — Other Ambulatory Visit: Payer: Self-pay | Admitting: Gastroenterology

## 2023-03-23 ENCOUNTER — Other Ambulatory Visit: Payer: Self-pay | Admitting: Internal Medicine

## 2023-03-23 DIAGNOSIS — L659 Nonscarring hair loss, unspecified: Secondary | ICD-10-CM

## 2023-03-23 DIAGNOSIS — K219 Gastro-esophageal reflux disease without esophagitis: Secondary | ICD-10-CM

## 2023-04-16 ENCOUNTER — Encounter (INDEPENDENT_AMBULATORY_CARE_PROVIDER_SITE_OTHER): Payer: Self-pay

## 2023-05-19 ENCOUNTER — Ambulatory Visit (INDEPENDENT_AMBULATORY_CARE_PROVIDER_SITE_OTHER): Payer: Medicare HMO | Admitting: Internal Medicine

## 2023-05-19 ENCOUNTER — Other Ambulatory Visit: Payer: Self-pay | Admitting: Internal Medicine

## 2023-05-19 ENCOUNTER — Encounter: Payer: Self-pay | Admitting: Internal Medicine

## 2023-05-19 VITALS — BP 128/84 | HR 61 | Temp 98.0°F | Ht 66.0 in | Wt 190.0 lb

## 2023-05-19 DIAGNOSIS — M10072 Idiopathic gout, left ankle and foot: Secondary | ICD-10-CM | POA: Diagnosis not present

## 2023-05-19 DIAGNOSIS — M1A0720 Idiopathic chronic gout, left ankle and foot, without tophus (tophi): Secondary | ICD-10-CM | POA: Diagnosis not present

## 2023-05-19 DIAGNOSIS — M159 Polyosteoarthritis, unspecified: Secondary | ICD-10-CM

## 2023-05-19 DIAGNOSIS — G629 Polyneuropathy, unspecified: Secondary | ICD-10-CM | POA: Insufficient documentation

## 2023-05-19 DIAGNOSIS — G320 Subacute combined degeneration of spinal cord in diseases classified elsewhere: Secondary | ICD-10-CM

## 2023-05-19 DIAGNOSIS — E538 Deficiency of other specified B group vitamins: Secondary | ICD-10-CM | POA: Diagnosis not present

## 2023-05-19 DIAGNOSIS — I10 Essential (primary) hypertension: Secondary | ICD-10-CM

## 2023-05-19 LAB — URIC ACID: Uric Acid, Serum: 8.7 mg/dL — ABNORMAL HIGH (ref 2.4–7.0)

## 2023-05-19 MED ORDER — GABAPENTIN 100 MG PO CAPS
100.0000 mg | ORAL_CAPSULE | Freq: Three times a day (TID) | ORAL | 1 refills | Status: AC
Start: 2023-05-19 — End: ?

## 2023-05-19 MED ORDER — COLCHICINE 0.6 MG PO TABS
0.6000 mg | ORAL_TABLET | Freq: Two times a day (BID) | ORAL | 0 refills | Status: DC
Start: 2023-05-19 — End: 2023-09-22

## 2023-05-19 MED ORDER — TRAMADOL HCL 50 MG PO TABS
50.0000 mg | ORAL_TABLET | Freq: Four times a day (QID) | ORAL | 5 refills | Status: AC | PRN
Start: 2023-05-19 — End: ?

## 2023-05-19 MED ORDER — TORSEMIDE 10 MG PO TABS: 10.0000 mg | ORAL_TABLET | Freq: Two times a day (BID) | ORAL | 0 refills | Status: DC

## 2023-05-19 MED ORDER — ALLOPURINOL 100 MG PO TABS
100.0000 mg | ORAL_TABLET | Freq: Every day | ORAL | 0 refills | Status: DC
Start: 2023-05-19 — End: 2023-09-22

## 2023-05-19 NOTE — Patient Instructions (Addendum)
Gout  Gout is a condition that causes painful swelling of the joints. Gout is a type of inflammation of the joints (arthritis). This condition is caused by having too much uric acid in the body. Uric acid is a chemical that forms when the body breaks down substances called purines. Purines are important for building body proteins. When the body has too much uric acid, sharp crystals can form and build up inside the joints. This causes pain and swelling. Gout attacks can happen quickly and may be very painful (acute gout). Over time, the attacks can affect more joints and become more frequent (chronic gout). Gout can also cause uric acid to build up under the skin and inside the kidneys. What are the causes? This condition is caused by too much uric acid in your blood. This can happen because: Your kidneys do not remove enough uric acid from your blood. This is the most common cause. Your body makes too much uric acid. This can happen with some cancers and cancer treatments. It can also occur if your body is breaking down too many red blood cells (hemolytic anemia). You eat too many foods that are high in purines. These foods include organ meats and some seafood. Alcohol, especially beer, is also high in purines. A gout attack may be triggered by trauma or stress. What increases the risk? The following factors may make you more likely to develop this condition: Having a family history of gout. Being female and middle-aged. Being female and having gone through menopause. Taking certain medicines, including aspirin, cyclosporine, diuretics, levodopa, and niacin. Having an organ transplant. Having certain conditions, such as: Being obese. Lead poisoning. Kidney disease. A skin condition called psoriasis. Other factors include: Losing weight too quickly. Being dehydrated. Frequently drinking alcohol, especially beer. Frequently drinking beverages that are sweetened with a type of sugar called  fructose. What are the signs or symptoms? An attack of acute gout happens quickly. It usually occurs in just one joint. The most common place is the big toe. Attacks often start at night. Other joints that may be affected include joints of the feet, ankle, knee, fingers, wrist, or elbow. Symptoms of this condition may include: Severe pain. Warmth. Swelling. Stiffness. Tenderness. The affected joint may be very painful to touch. Shiny, red, or purple skin. Chills and fever. Chronic gout may cause symptoms more frequently. More joints may be involved. You may also have white or yellow lumps (tophi) on your hands or feet or in other areas near your joints. How is this diagnosed? This condition is diagnosed based on your symptoms, your medical history, and a physical exam. You may have tests, such as: Blood tests to measure uric acid levels. Removal of joint fluid with a thin needle (aspiration) to look for uric acid crystals. X-rays to look for joint damage. How is this treated? Treatment for this condition has two phases: treating an acute attack and preventing future attacks. Acute gout treatment may include medicines to reduce pain and swelling, including: NSAIDs, such as ibuprofen. Steroids. These are strong anti-inflammatory medicines that can be taken by mouth (orally) or injected into a joint. Colchicine. This medicine relieves pain and swelling when it is taken soon after an attack. It can be given by mouth or through an IV. Preventive treatment may include: Daily use of smaller doses of NSAIDs or colchicine. Use of a medicine that reduces uric acid levels in your blood, such as allopurinol. Changes to your diet. You may need to see  a dietitian about what to eat and drink to prevent gout. Follow these instructions at home: During a gout attack  If directed, put ice on the affected area. To do this: Put ice in a plastic bag. Place a towel between your skin and the bag. Leave the  ice on for 20 minutes, 2-3 times a day. Remove the ice if your skin turns bright red. This is very important. If you cannot feel pain, heat, or cold, you have a greater risk of damage to the area. Raise (elevate) the affected joint above the level of your heart as often as possible. Rest the joint as much as possible. If the affected joint is in your leg, you may be given crutches to use. Follow instructions from your health care provider about eating or drinking restrictions. Avoiding future gout attacks Follow a low-purine diet as told by your dietitian or health care provider. Avoid foods and drinks that are high in purines, including liver, kidney, anchovies, asparagus, herring, mushrooms, mussels, and beer. Maintain a healthy weight or lose weight if you are overweight. If you want to lose weight, talk with your health care provider. Do not lose weight too quickly. Start or maintain an exercise program as told by your health care provider. Eating and drinking Avoid drinking beverages that contain fructose. Drink enough fluids to keep your urine pale yellow. If you drink alcohol: Limit how much you have to: 0-1 drink a day for women who are not pregnant. 0-2 drinks a day for men. Know how much alcohol is in a drink. In the [place] one drink equals one 12 oz bottle of beer (355 mL), one 5 oz glass of wine (148 mL), or one 1 oz glass of hard liquor (44 mL). General instructions Take over-the-counter and prescription medicines only as told by your health care provider. Ask your health care provider if the medicine prescribed to you requires you to avoid driving or using machinery. Return to your normal activities as told by your health care provider. Ask your health care provider what activities are safe for you. Keep all follow-up visits. This is important. Where to find more information [employer]: www.niams.http://www.myers.net/ Contact a health care provider if you have: Another  gout attack. Continuing symptoms of a gout attack after 10 days of treatment. Side effects from your medicines. Chills or a fever. Burning pain when you urinate. Pain in your lower back or abdomen. Get help right away if you: Have severe or uncontrolled pain. Cannot urinate. Summary Gout is painful swelling of the joints caused by having too much uric acid in the body. The most common site for gout to occur is in the big toe, but it can affect other joints in the body. Medicines and dietary changes can help to prevent and treat gout attacks. This information is not intended to replace advice given to you by your health care provider. Make sure you discuss any questions you have with your health care provider. Document Revised: 05/22/2021 Document Reviewed: 05/22/2021 Elsevier Patient Education  2024 [employer].

## 2023-05-19 NOTE — Progress Notes (Unsigned)
Subjective:  Patient ID: [name], female    DOB: 11/11/1932  Age: 89 y.o. MRN: [id number]  CC: Osteoarthritis and Hypertension   HPI [name] presents for f/up ----  Discussed the use of AI scribe software for clinical note transcription with the patient, who gave verbal consent to proceed.  History of Present Illness   The patient, with a history of hypertension, gout, and neuropathy, presents with complaints of persistent tiredness, insomnia, and urinary issues. They report that the torsemide does not seem to be as effective as the previously used Chlorthalidone in managing their urinary symptoms. They describe a sensation of incomplete bladder emptying and have noticed an increase in shortness of breath, particularly when performing tasks such as walking to the mailbox or taking out the trash.  The patient also reports hair loss since starting torsemide, which they find distressing. They have a history of gout and are currently experiencing discomfort in their toe, which they describe as stiff and sore. They have previously had a reaction to prednisone and are not currently taking any medication for gout.  In addition to these issues, the patient is experiencing symptoms of neuropathy, which they describe as a combination of pain and numbness. They have previously undergone tests for this condition, but no cause was identified. They occasionally take tramadol for the pain in their feet and legs.  The patient has declined flu shots in the past due to previous adverse reactions. They are unsure about their status regarding pneumonia, shingles, and tetanus vaccines. They have expressed a desire for a new prescription for tramadol and a change in their diuretic medication.       Outpatient Medications Prior to Visit  Medication Sig Dispense Refill   acetaminophen (TYLENOL) 500 MG tablet Take 1,000 mg by mouth every 6 (six) hours as needed for headache (pain).     Calcium  Carbonate-Vitamin D (CALCIUM-D PO) Take 1 tablet by mouth at bedtime. Calcium 630 mg, Vitamin D3 500 units     cyanocobalamin 2000 MCG tablet Take 1 tablet (2,000 mcg total) by mouth every other day. (Patient taking differently: Take 2,000 mcg by mouth daily.) 45 tablet 1   diclofenac sodium (VOLTAREN) 1 % GEL Apply 2 g topically 4 (four) times daily. 100 g 1   Dietary Management Product (FOSTEUM PLUS) CAPS Take 1 capsule by mouth 2 (two) times daily. 180 capsule 2   famotidine (PEPCID) 40 MG tablet TAKE 1 TABLET AT BEDTIME 90 tablet 3   finasteride (PROPECIA) 1 MG tablet TAKE 1 TABLET AT BEDTIME 90 tablet 0   metoprolol tartrate (LOPRESSOR) 25 MG tablet TAKE 1 AND 1/2 TABLETS IN THE MORNING AND TAKE 1 TABLET AT BEDTIME 225 tablet 3   NEOMYCIN-POLYMYXIN-HYDROCORTISONE (CORTISPORIN) 1 % SOLN OTIC solution Place 3 drops into the right ear every 8 (eight) hours. 10 mL 0   Peppermint Oil (IBGARD) 90 MG CPCR 1 capsule three times daily as needed     polyethylene glycol powder (MIRALAX) 17 GM/SCOOP powder 1/2 capful daily. 255 g 0   potassium chloride (KLOR-CON M) 10 MEQ tablet TAKE 2 TABLETS DAILY (DOSE INCREASED DUE TO HYPOKALEMIA) 180 tablet 3   Rivaroxaban (XARELTO) 15 MG TABS tablet Take 1 tablet (15 mg total) by mouth daily with supper. 90 tablet 0   sotalol (BETAPACE) 120 MG tablet TAKE 1 TABLET (120 MG TOTAL) BY MOUTH EVERY 12 (TWELVE) HOURS. 180 tablet 3   Wheat Dextrin (BENEFIBER) POWD 1 tablespoon 2 to 3 times  daily with meals (Patient taking differently: Take by mouth daily in the afternoon. 1 tablespoon 2 to 3 times daily with meals) 30 g 0   diphenoxylate-atropine (LOMOTIL) 2.5-0.025 MG tablet Take 1 tablet by mouth 4 (four) times daily as needed for diarrhea or loose stools. 30 tablet 0   gabapentin (NEURONTIN) 100 MG capsule Take 1 capsule (100 mg total) by mouth 3 (three) times daily. 270 capsule 1   torsemide (DEMADEX) 10 MG tablet Take 1 tablet (10 mg total) by mouth 2 (two) times daily.  180 tablet 0   traMADol (ULTRAM) 50 MG tablet TAKE 1 TABLET BY MOUTH EVERY 6 HOURS AS NEEDED 90 tablet 5   No facility-administered medications prior to visit.    ROS Review of Systems  Constitutional: Negative.  Negative for diaphoresis, fatigue and unexpected weight change.  Respiratory:  Positive for shortness of breath. Negative for cough, chest tightness and wheezing.   Cardiovascular:  Negative for chest pain, palpitations and leg swelling.  Gastrointestinal:  Negative for abdominal pain, diarrhea, nausea and vomiting.  Endocrine: Negative.   Genitourinary: Negative.  Negative for difficulty urinating.  Musculoskeletal:  Positive for arthralgias.  Skin: Negative.   Neurological: Negative.  Negative for dizziness and weakness.  Hematological:  Negative for adenopathy. Does not bruise/bleed easily.  Psychiatric/Behavioral: Negative.      Objective:  BP 128/84 (BP Location: Right Arm, Patient Position: Sitting, Cuff Size: Large)   Pulse 61   Temp 98 F (36.7 C) (Oral)   Ht 5\' 6"  (1.676 m)   Wt 190 lb (86.2 kg)   SpO2 95%   BMI 30.67 kg/m   BP Readings from Last 3 Encounters:  05/19/23 128/84  03/15/23 (!) 151/79  03/13/23 (!) 153/91    Wt Readings from Last 3 Encounters:  05/19/23 190 lb (86.2 kg)  03/13/23 180 lb (81.6 kg)  02/16/23 190 lb (86.2 kg)    Physical Exam Vitals reviewed.  Constitutional:      Appearance: Normal appearance.  HENT:     Mouth/Throat:     Mouth: Mucous membranes are moist.  Eyes:     General: No scleral icterus.    Conjunctiva/sclera: Conjunctivae normal.  Cardiovascular:     Rate and Rhythm: Normal rate and regular rhythm.     Heart sounds: No murmur heard.    No friction rub. No gallop.  Pulmonary:     Effort: Pulmonary effort is normal.     Breath sounds: No stridor. No wheezing, rhonchi or rales.  Abdominal:     General: Abdomen is flat.     Palpations: There is no mass.     Tenderness: There is no abdominal tenderness.  There is no guarding.     Hernia: No hernia is present.  Musculoskeletal:        General: Swelling, tenderness and deformity present.     Cervical back: Neck supple.     Right lower leg: No edema.     Left lower leg: No edema.       Feet:  Lymphadenopathy:     Cervical: No cervical adenopathy.  Skin:    General: Skin is warm.     Findings: No erythema or rash.  Neurological:     General: No focal deficit present.     Mental Status: She is alert. Mental status is at baseline.  Psychiatric:        Mood and Affect: Mood normal.        Behavior: Behavior normal.  Lab Results  Component Value Date   WBC 5.2 02/16/2023   HGB 14.1 02/16/2023   HCT 43.4 02/16/2023   PLT 204.0 02/16/2023   GLUCOSE 108 (H) 02/16/2023   CHOL 185 05/19/2022   TRIG 185.0 (H) 05/19/2022   HDL 49.40 05/19/2022   LDLDIRECT 150.0 12/28/2017   LDLCALC 99 05/19/2022   ALT 14 04/09/2022   AST 19 04/09/2022   NA 139 02/16/2023   K 3.7 02/16/2023   CL 100 02/16/2023   CREATININE 0.98 02/16/2023   BUN 20 02/16/2023   CO2 31 02/16/2023   TSH 1.43 02/16/2023   INR 1.02 07/07/2017   HGBA1C 5.5 12/18/2016    No results found.  Assessment & Plan:  Acute idiopathic gout of left foot -     Uric acid; Future -     Colchicine; Take 1 tablet (0.6 mg total) by mouth 2 (two) times daily.  Dispense: 180 tablet; Refill: 0  Neuromyelopathy due to vitamin B12 deficiency (HCC) -     Gabapentin; Take 1 capsule (100 mg total) by mouth 3 (three) times daily.  Dispense: 270 capsule; Refill: 1 -     traMADol HCl; Take 1 tablet (50 mg total) by mouth every 6 (six) hours as needed.  Dispense: 90 tablet; Refill: 5  Neuropathy -     Gabapentin; Take 1 capsule (100 mg total) by mouth 3 (three) times daily.  Dispense: 270 capsule; Refill: 1 -     traMADol HCl; Take 1 tablet (50 mg total) by mouth every 6 (six) hours as needed.  Dispense: 90 tablet; Refill: 5  Primary osteoarthritis involving multiple joints -      traMADol HCl; Take 1 tablet (50 mg total) by mouth every 6 (six) hours as needed.  Dispense: 90 tablet; Refill: 5  Essential hypertension -     Torsemide; Take 1 tablet (10 mg total) by mouth 2 (two) times daily.  Dispense: 180 tablet; Refill: 0  Idiopathic chronic gout of left foot without tophus -     Allopurinol; Take 1 tablet (100 mg total) by mouth daily.  Dispense: 90 tablet; Refill: 0     Follow-up: Return in about 4 months (around 09/18/2023).  Sanda Linger, MD

## 2023-05-26 ENCOUNTER — Other Ambulatory Visit: Payer: Self-pay | Admitting: Cardiovascular Disease

## 2023-05-26 DIAGNOSIS — I480 Paroxysmal atrial fibrillation: Secondary | ICD-10-CM

## 2023-06-08 ENCOUNTER — Other Ambulatory Visit: Payer: Self-pay | Admitting: Internal Medicine

## 2023-06-08 DIAGNOSIS — L659 Nonscarring hair loss, unspecified: Secondary | ICD-10-CM

## 2023-08-27 ENCOUNTER — Other Ambulatory Visit: Payer: Self-pay | Admitting: Cardiovascular Disease

## 2023-08-31 ENCOUNTER — Other Ambulatory Visit: Payer: Self-pay | Admitting: Cardiovascular Disease

## 2023-08-31 DIAGNOSIS — I10 Essential (primary) hypertension: Secondary | ICD-10-CM

## 2023-08-31 DIAGNOSIS — I480 Paroxysmal atrial fibrillation: Secondary | ICD-10-CM

## 2023-09-22 ENCOUNTER — Encounter: Payer: Self-pay | Admitting: Internal Medicine

## 2023-09-22 ENCOUNTER — Ambulatory Visit: Payer: Medicare HMO | Admitting: Internal Medicine

## 2023-09-22 ENCOUNTER — Ambulatory Visit (INDEPENDENT_AMBULATORY_CARE_PROVIDER_SITE_OTHER): Payer: Medicare HMO

## 2023-09-22 VITALS — BP 142/82 | HR 59 | Temp 98.1°F | Resp 16 | Ht 66.0 in | Wt 189.6 lb

## 2023-09-22 DIAGNOSIS — M545 Low back pain, unspecified: Secondary | ICD-10-CM

## 2023-09-22 DIAGNOSIS — M47816 Spondylosis without myelopathy or radiculopathy, lumbar region: Secondary | ICD-10-CM | POA: Diagnosis not present

## 2023-09-22 DIAGNOSIS — Z0000 Encounter for general adult medical examination without abnormal findings: Secondary | ICD-10-CM | POA: Diagnosis not present

## 2023-09-22 DIAGNOSIS — G320 Subacute combined degeneration of spinal cord in diseases classified elsewhere: Secondary | ICD-10-CM | POA: Diagnosis not present

## 2023-09-22 DIAGNOSIS — E785 Hyperlipidemia, unspecified: Secondary | ICD-10-CM | POA: Diagnosis not present

## 2023-09-22 DIAGNOSIS — M4856XA Collapsed vertebra, not elsewhere classified, lumbar region, initial encounter for fracture: Secondary | ICD-10-CM | POA: Diagnosis not present

## 2023-09-22 DIAGNOSIS — E538 Deficiency of other specified B group vitamins: Secondary | ICD-10-CM

## 2023-09-22 DIAGNOSIS — I480 Paroxysmal atrial fibrillation: Secondary | ICD-10-CM | POA: Diagnosis not present

## 2023-09-22 DIAGNOSIS — M1A0720 Idiopathic chronic gout, left ankle and foot, without tophus (tophi): Secondary | ICD-10-CM | POA: Diagnosis not present

## 2023-09-22 DIAGNOSIS — M4316 Spondylolisthesis, lumbar region: Secondary | ICD-10-CM | POA: Diagnosis not present

## 2023-09-22 DIAGNOSIS — Z0001 Encounter for general adult medical examination with abnormal findings: Secondary | ICD-10-CM

## 2023-09-22 DIAGNOSIS — M818 Other osteoporosis without current pathological fracture: Secondary | ICD-10-CM | POA: Diagnosis not present

## 2023-09-22 LAB — LIPID PANEL
Cholesterol: 232 mg/dL — ABNORMAL HIGH (ref 0–200)
HDL: 50.5 mg/dL (ref >39.00)
LDL Cholesterol: 140 mg/dL — ABNORMAL HIGH (ref 0–99)
NonHDL: 181.95
Total CHOL/HDL Ratio: 5
Triglycerides: 210 mg/dL — ABNORMAL HIGH (ref 0.0–149.0)
VLDL: 42 mg/dL — ABNORMAL HIGH (ref 0.0–40.0)

## 2023-09-22 LAB — BASIC METABOLIC PANEL
BUN: 20 mg/dL (ref 6–23)
CO2: 30 meq/L (ref 19–32)
Calcium: 10.2 mg/dL (ref 8.4–10.5)
Chloride: 106 meq/L (ref 96–112)
Creatinine, Ser: 0.86 mg/dL (ref 0.40–1.20)
GFR: 60.16 mL/min (ref >60.00)
Glucose, Bld: 103 mg/dL — ABNORMAL HIGH (ref 70–99)
Potassium: 4.5 meq/L (ref 3.5–5.1)
Sodium: 142 meq/L (ref 135–145)

## 2023-09-22 LAB — CBC WITH DIFFERENTIAL/PLATELET
Basophils Absolute: 0 10*3/uL (ref 0.0–0.1)
Basophils Relative: 0.7 % (ref 0.0–3.0)
Eosinophils Absolute: 0.4 10*3/uL (ref 0.0–0.7)
Eosinophils Relative: 7.1 % — ABNORMAL HIGH (ref 0.0–5.0)
HCT: 42.1 % (ref 36.0–46.0)
Hemoglobin: 13.5 g/dL (ref 12.0–15.0)
Lymphocytes Relative: 30.2 % (ref 12.0–46.0)
Lymphs Abs: 1.8 10*3/uL (ref 0.7–4.0)
MCHC: 32.1 g/dL (ref 30.0–36.0)
MCV: 85.2 fL (ref 78.0–100.0)
Monocytes Absolute: 0.6 10*3/uL (ref 0.1–1.0)
Monocytes Relative: 9.9 % (ref 3.0–12.0)
Neutro Abs: 3 10*3/uL (ref 1.4–7.7)
Neutrophils Relative %: 52.1 % (ref 43.0–77.0)
Platelets: 201 10*3/uL (ref 150.0–400.0)
RBC: 4.94 Mil/uL (ref 3.87–5.11)
RDW: 15.8 % — ABNORMAL HIGH (ref 11.5–15.5)
WBC: 5.8 10*3/uL (ref 4.0–10.5)

## 2023-09-22 LAB — HEPATIC FUNCTION PANEL
ALT: 13 U/L (ref 0–35)
AST: 18 U/L (ref 0–37)
Albumin: 4.4 g/dL (ref 3.5–5.2)
Alkaline Phosphatase: 72 U/L (ref 39–117)
Bilirubin, Direct: 0 mg/dL (ref 0.0–0.3)
Total Bilirubin: 0.9 mg/dL (ref 0.2–1.2)
Total Protein: 7.5 g/dL (ref 6.0–8.3)

## 2023-09-22 LAB — URIC ACID: Uric Acid, Serum: 7 mg/dL (ref 2.4–7.0)

## 2023-09-22 MED ORDER — FOSTEUM PLUS PO CAPS: 1.0000 | ORAL_CAPSULE | Freq: Two times a day (BID) | ORAL | 2 refills | Status: DC

## 2023-09-22 MED ORDER — ALLOPURINOL 100 MG PO TABS: 100.0000 mg | ORAL_TABLET | Freq: Every day | ORAL | 0 refills | Status: DC

## 2023-09-22 MED ORDER — COLCHICINE 0.6 MG PO TABS: 0.6000 mg | ORAL_TABLET | Freq: Two times a day (BID) | ORAL | 0 refills | Status: DC

## 2023-09-22 MED ORDER — RIVAROXABAN 15 MG PO TABS
15.0000 mg | ORAL_TABLET | Freq: Every day | ORAL | 0 refills | Status: DC
Start: 2023-09-22 — End: 2023-12-07

## 2023-09-22 NOTE — Progress Notes (Signed)
Subjective:  Patient ID: [name], female    DOB: [birth year]  Age: 89 y.o. MRN: [id number]  CC: Annual Exam, Atrial Fibrillation, Hypertension, Hyperlipidemia, and Back Pain   HPI [name] presents for a CPX and f/up --   Discussed the use of AI scribe software for clinical note transcription with the patient, who gave verbal consent to proceed.  History of Present Illness   The patient, with a history of atrial fibrillation, neuropathy, and gout, presents with complaints of shortness of breath on exertion, which is not a new symptom. The patient denies any recent gout attacks but reports ongoing issues with neuropathy, which she describes as worsening and extending from the feet up to the knees. The neuropathy is characterized by numbness, soreness, and stabbing pains, as well as cramping in the feet and toes. The patient is currently on gabapentin, which she takes at bedtime, but reports that it affects her cognition and balance.  The patient also reports issues with her blood thinner, experiencing occasional nasal bleeding. She is currently taking tramadol, which she reports is no longer effective, and torsemide, which she wishes to discontinue due to its diuretic effects interfering with her daily activities. The patient reports that without torsemide, her blood pressure increases and she experiences swelling in her feet and legs.  In addition to these issues, the patient reports new onset of lower back pain, located on either side of the spine. The pain is noticeable when turning over in bed and with certain movements. The patient denies any radiation of the pain into the legs or feet. The patient is unsure if the pain is related to her neuropathy or a new issue, such as arthritis. She is unsure if her current pain medication, tramadol, is effective for this new pain.  The patient also mentions a decrease in physical activity due to balance issues related to her neuropathy. She  has tried electrical impulse pads for the neuropathy, which initially helped with the numbness in her feet, but are no longer effective. She is currently on colchicine and allopurinol for gout, and is unsure if she should continue these medications. The patient also reports issues with torsemide, which she feels is ruling her life due to its diuretic effects. She expresses a desire for an alternative medication.       Outpatient Medications Prior to Visit  Medication Sig Dispense Refill   acetaminophen (TYLENOL) 500 MG tablet Take 1,000 mg by mouth every 6 (six) hours as needed for headache (pain).     Calcium Carbonate-Vitamin D (CALCIUM-D PO) Take 1 tablet by mouth at bedtime. Calcium 630 mg, Vitamin D3 500 units     cyanocobalamin 2000 MCG tablet Take 1 tablet (2,000 mcg total) by mouth every other day. (Patient taking differently: Take 2,000 mcg by mouth daily.) 45 tablet 1   diclofenac sodium (VOLTAREN) 1 % GEL Apply 2 g topically 4 (four) times daily. 100 g 1   famotidine (PEPCID) 40 MG tablet TAKE 1 TABLET AT BEDTIME 90 tablet 3   finasteride (PROPECIA) 1 MG tablet TAKE 1 TABLET AT BEDTIME 90 tablet 1   gabapentin (NEURONTIN) 100 MG capsule Take 1 capsule (100 mg total) by mouth 3 (three) times daily. (Patient taking differently: Take 100 mg by mouth daily. At night time) 270 capsule 1   metoprolol tartrate (LOPRESSOR) 25 MG tablet TAKE 1 AND 1/2 TABLETS IN THE MORNING AND TAKE 1 TABLET AT BEDTIME 225 tablet 3   Peppermint Oil (IBGARD)  90 MG CPCR 1 capsule three times daily as needed     polyethylene glycol powder (MIRALAX) 17 GM/SCOOP powder 1/2 capful daily. 255 g 0   potassium chloride (KLOR-CON M) 10 MEQ tablet TAKE 2 TABLETS EVERY DAY (DOSE INCREASED DUE TO HYPOKALEMIA) 180 tablet 0   sotalol (BETAPACE) 120 MG tablet TAKE 1 TABLET EVERY 12 HOURS 180 tablet 1   torsemide (DEMADEX) 10 MG tablet Take 1 tablet (10 mg total) by mouth 2 (two) times daily. (Patient taking differently: Take 10  mg by mouth daily.) 180 tablet 0   traMADol (ULTRAM) 50 MG tablet Take 1 tablet (50 mg total) by mouth every 6 (six) hours as needed. 90 tablet 5   Wheat Dextrin (BENEFIBER) POWD 1 tablespoon 2 to 3 times daily with meals (Patient taking differently: Take by mouth daily in the afternoon. 1 tablespoon 2 to 3 times daily with meals) 30 g 0   Rivaroxaban (XARELTO) 15 MG TABS tablet Take 1 tablet (15 mg total) by mouth daily with supper. 90 tablet 0   allopurinol (ZYLOPRIM) 100 MG tablet Take 1 tablet (100 mg total) by mouth daily. (Patient not taking: Reported on 09/22/2023) 90 tablet 0   colchicine 0.6 MG tablet Take 1 tablet (0.6 mg total) by mouth 2 (two) times daily. (Patient not taking: Reported on 09/22/2023) 180 tablet 0   Dietary Management Product (FOSTEUM PLUS) CAPS Take 1 capsule by mouth 2 (two) times daily. (Patient not taking: Reported on 09/22/2023) 180 capsule 2   NEOMYCIN-POLYMYXIN-HYDROCORTISONE (CORTISPORIN) 1 % SOLN OTIC solution Place 3 drops into the right ear every 8 (eight) hours. 10 mL 0   No facility-administered medications prior to visit.    ROS Review of Systems  Constitutional:  Negative for chills, diaphoresis, fatigue and unexpected weight change.  HENT:  Positive for hearing loss. Negative for trouble swallowing.   Respiratory:  Positive for shortness of breath (DOE). Negative for cough, chest tightness and wheezing.   Cardiovascular:  Negative for chest pain, palpitations and leg swelling.  Gastrointestinal: Negative.  Negative for abdominal pain, diarrhea, nausea and vomiting.  Genitourinary: Negative.  Negative for difficulty urinating and dysuria.  Musculoskeletal:  Negative for arthralgias, back pain, joint swelling and myalgias.  Skin: Negative.   Neurological:  Positive for numbness. Negative for dizziness, weakness and light-headedness.  Hematological:  Negative for adenopathy. Does not bruise/bleed easily.  Psychiatric/Behavioral:  Positive for confusion  and decreased concentration.     Objective:  BP (!) 142/82 (BP Location: Left Arm, Patient Position: Sitting, Cuff Size: Normal)   Pulse (!) 59   Temp 98.1 F (36.7 C) (Oral)   Resp 16   Ht 5\' 6"  (1.676 m)   Wt 189 lb 9.6 oz (86 kg)   SpO2 95%   BMI 30.60 kg/m   BP Readings from Last 3 Encounters:  09/22/23 (!) 142/82  05/19/23 128/84  03/15/23 (!) 151/79    Wt Readings from Last 3 Encounters:  09/22/23 189 lb 9.6 oz (86 kg)  05/19/23 190 lb (86.2 kg)  03/13/23 180 lb (81.6 kg)    Physical Exam Vitals reviewed.  Constitutional:      Appearance: Normal appearance.  HENT:     Mouth/Throat:     Mouth: Mucous membranes are moist.  Eyes:     General: No scleral icterus.    Conjunctiva/sclera: Conjunctivae normal.  Cardiovascular:     Rate and Rhythm: Regular rhythm. Bradycardia present.     Heart sounds: No murmur heard.  No friction rub. No gallop.  Pulmonary:     Effort: Pulmonary effort is normal.     Breath sounds: No stridor. No wheezing, rhonchi or rales.  Abdominal:     General: Abdomen is flat.     Palpations: There is no mass.     Tenderness: There is no abdominal tenderness. There is no guarding.     Hernia: No hernia is present.  Musculoskeletal:     Cervical back: Neck supple.     Thoracic back: Normal.     Lumbar back: Bony tenderness present. No signs of trauma. Decreased range of motion. Negative right straight leg raise test and negative left straight leg raise test.     Right lower leg: No edema.     Left lower leg: No edema.  Skin:    General: Skin is warm and dry.  Neurological:     General: No focal deficit present.     Mental Status: She is alert. Mental status is at baseline.  Psychiatric:        Mood and Affect: Mood normal.        Behavior: Behavior normal.     Lab Results  Component Value Date   WBC 5.2 02/16/2023   HGB 14.1 02/16/2023   HCT 43.4 02/16/2023   PLT 204.0 02/16/2023   GLUCOSE 108 (H) 02/16/2023   CHOL 185  05/19/2022   TRIG 185.0 (H) 05/19/2022   HDL 49.40 05/19/2022   LDLDIRECT 150.0 12/28/2017   LDLCALC 99 05/19/2022   ALT 14 04/09/2022   AST 19 04/09/2022   NA 139 02/16/2023   K 3.7 02/16/2023   CL 100 02/16/2023   CREATININE 0.98 02/16/2023   BUN 20 02/16/2023   CO2 31 02/16/2023   TSH 1.43 02/16/2023   INR 1.02 07/07/2017   HGBA1C 5.5 12/18/2016    DG Lumbar Spine Complete Result Date: 09/22/2023 CLINICAL DATA:  Low back pain. EXAM: LUMBAR SPINE - COMPLETE 4+ VIEW COMPARISON:  CT dated 09/23/2007. FINDINGS: Evaluation is limited due to osteopenia and body habitus. Five lumbar type vertebra. Mild age indeterminate compression fractures of the L2 and L3. Correlation with clinical exam and point tenderness recommended. There is grade 1 L4-L5 anterolisthesis. Multilevel facet arthropathy. There is atherosclerotic calcification of the abdominal aorta. The soft tissues are unremarkable. IMPRESSION: 1. Mild age indeterminate compression fractures of the L2 and L3. Correlation with clinical exam and point tenderness recommended. 2. Grade 1 L4-L5 anterolisthesis. Electronically Signed   By: Elgie Collard M.D.   On: 09/22/2023 12:43     Assessment & Plan:  Dyslipidemia, goal LDL below 70 -     Lipid panel; Future -     Hepatic function panel; Future  Paroxysmal atrial fibrillation (HCC)- She has godd R/R control. -     Rivaroxaban; Take 1 tablet (15 mg total) by mouth daily with supper.  Dispense: 90 tablet; Refill: 0  Other osteoporosis without current pathological fracture -     Fosteum Plus; Take 1 capsule by mouth 2 (two) times daily.  Dispense: 180 capsule; Refill: 2 -     Basic metabolic panel; Future  Idiopathic chronic gout of left foot without tophus -     Allopurinol; Take 1 tablet (100 mg total) by mouth daily.  Dispense: 90 tablet; Refill: 0 -     Colchicine; Take 1 tablet (0.6 mg total) by mouth 2 (two) times daily.  Dispense: 180 tablet; Refill: 0 -     Basic metabolic  panel; Future -  Uric acid; Future  Neuromyelopathy due to vitamin B12 deficiency (HCC) -     CBC with Differential/Platelet; Future  Encounter for general adult medical examination with abnormal findings- Exam completed, labs reviewed, vaccines reviewed, no cancer screenings indicated, pt ed material was given.   Acute bilateral low back pain without sciatica- She has vertebral fractures. Will continue tramadol prn. -     DG Lumbar Spine Complete; Future     Follow-up: Return in about 6 months (around 03/21/2024).  Sanda Linger, MD

## 2023-09-22 NOTE — Patient Instructions (Signed)

## 2023-09-23 ENCOUNTER — Encounter: Payer: Self-pay | Admitting: Internal Medicine

## 2023-10-13 ENCOUNTER — Other Ambulatory Visit: Payer: Self-pay

## 2023-10-13 ENCOUNTER — Telehealth: Payer: Self-pay | Admitting: Internal Medicine

## 2023-10-13 DIAGNOSIS — M818 Other osteoporosis without current pathological fracture: Secondary | ICD-10-CM

## 2023-10-13 MED ORDER — FOSTEUM PLUS PO CAPS: 1.0000 | ORAL_CAPSULE | Freq: Two times a day (BID) | ORAL | 2 refills | Status: AC

## 2023-10-13 NOTE — Telephone Encounter (Signed)
Spoke with the patient and she advised me that this medication was sent to the wrong pharmacy. I got her medication sent to the correct pharmacy.

## 2023-10-13 NOTE — Telephone Encounter (Unsigned)
Copied from CRM [id number]. Topic: Clinical - Prescription Issue >> Oct 13, 2023  2:34 PM Isabell A wrote: Reason for CRM:  Patient states her insurance is charging her $16k for Dietary Management Product (FOSTEUM PLUS) CAPS - would like to speak to someone in regard to this.

## 2023-10-23 ENCOUNTER — Other Ambulatory Visit: Payer: Self-pay | Admitting: Cardiovascular Disease

## 2023-10-23 ENCOUNTER — Ambulatory Visit: Payer: Medicare HMO | Attending: Cardiovascular Disease | Admitting: Cardiovascular Disease

## 2023-10-23 ENCOUNTER — Encounter: Payer: Self-pay | Admitting: Cardiovascular Disease

## 2023-10-23 VITALS — BP 139/73 | HR 62 | Ht 66.0 in | Wt 192.0 lb

## 2023-10-23 DIAGNOSIS — I10 Essential (primary) hypertension: Secondary | ICD-10-CM | POA: Diagnosis not present

## 2023-10-23 DIAGNOSIS — I480 Paroxysmal atrial fibrillation: Secondary | ICD-10-CM

## 2023-10-23 DIAGNOSIS — R0602 Shortness of breath: Secondary | ICD-10-CM | POA: Diagnosis not present

## 2023-10-23 DIAGNOSIS — E785 Hyperlipidemia, unspecified: Secondary | ICD-10-CM

## 2023-10-23 DIAGNOSIS — I358 Other nonrheumatic aortic valve disorders: Secondary | ICD-10-CM

## 2023-10-23 DIAGNOSIS — E782 Mixed hyperlipidemia: Secondary | ICD-10-CM

## 2023-10-23 DIAGNOSIS — I739 Peripheral vascular disease, unspecified: Secondary | ICD-10-CM | POA: Diagnosis not present

## 2023-10-23 DIAGNOSIS — Z7901 Long term (current) use of anticoagulants: Secondary | ICD-10-CM | POA: Diagnosis not present

## 2023-10-23 MED ORDER — EZETIMIBE 10 MG PO TABS: 10.0000 mg | ORAL_TABLET | Freq: Every day | ORAL | 3 refills | Status: AC

## 2023-10-23 NOTE — Patient Instructions (Signed)
Medication Instructions:  BEGIN ZETIA 10MG  DAILY. *If you need a refill on your cardiac medications before your next appointment, please call your pharmacy*   Lab Work: No labs were ordered during today's visit.  If you have labs (blood work) drawn today and your tests are completely normal, you will receive your results only by: MyChart Message (if you have MyChart) OR A paper copy in the mail If you have any lab test that is abnormal or we need to change your treatment, we will call you to review the results.   Testing/Procedures: Your physician has requested that you have an echocardiogram. Echocardiography is a painless test that uses sound waves to create images of your heart. It provides your doctor with information about the size and shape of your heart and how well your heart's chambers and valves are working. This procedure takes approximately one hour. There are no restrictions for this procedure. Please do NOT wear cologne, perfume, aftershave, or lotions (deodorant is allowed). Please arrive 15 minutes prior to your appointment time.  Please note: We ask at that you not bring children with you during ultrasound (echo/ vascular) testing. Due to room size and safety concerns, children are not allowed in the ultrasound rooms during exams. Our front office staff cannot provide observation of children in our lobby area while testing is being conducted. An adult accompanying a patient to their appointment will only be allowed in the ultrasound room at the discretion of the ultrasound technician under special circumstances. We apologize for any inconvenience.    Follow-Up: At [facility], you and your health needs are our priority.  As part of our continuing mission to provide you with exceptional heart care, we have created designated Provider Care Teams.  These Care Teams include your primary Cardiologist (physician) and Advanced Practice Providers (APPs -  Physician Assistants  and Nurse Practitioners) who all work together to provide you with the care you need, when you need it.  We recommend signing up for the patient portal called "MyChart".  Sign up information is provided on this After Visit Summary.  MyChart is used to connect with patients for Virtual Visits (Telemedicine).  Patients are able to view lab/test results, encounter notes, upcoming appointments, etc.  Non-urgent messages can be sent to your provider as well.   To learn more about what you can do with MyChart, go to [email or web address].    Your next appointment:   6 month(s)  Provider:   DR. Epifanio Lesches    Other Instructions        1st Floor: - Lobby - Registration  - Pharmacy  - Lab - Cafe   2nd Floor: - PV Lab - Diagnostic Testing (echo, CT, nuclear med)   3rd Floor: - Vacant   4th Floor: - TCTS (cardiothoracic surgery) - AFib Clinic - Structural Heart Clinic - Vascular Surgery  - Vascular Ultrasound   5th Floor: - [facility] (general and EP) - Clinical Pharmacy for coumadin, hypertension, lipid, weight-loss medications, and med management appointments      Valet parking services will be available as well.

## 2023-10-23 NOTE — Progress Notes (Signed)
 Patient ID: [name], female   DOB: November 29, 1932, 89 y.o.   MRN: [id number]      PCP: [name]  HPI: [name] is a 89 y.o. female who presents for a 62 month cardiology evaluation.  [name] has a history of paroxysmal atrial fibrillation, hypertension, as well as hyperlipidemia. Remotely, she developed myalgias with simvastatin and had not been willing to try additional lipid lowering therapy. In August 2012 an echo Doppler study showed mild asymmetric LVH with proximal septal thickening without LVOT obstruction. She had grade 1 diastolic dysfunction with normal systolic function, mild MR, mild TR, and aortic valve sclerosis with mild MR.  In December 2014 follow-up blood work showed normal renal function with a BUN of 19, creatinine 0.8.  She continued to have significant hyperlipidemia with a total cholesterol of 250, triglycerides 221 HDL 53, VLDL 44 and LDL cholesterol 153.  TSH was normal at 2.275.   [name]  had noticed development of claudication, particularly involving her left leg with activity.  She had episodes of shortness of breath and fatigue.  She  underwent a lower extremity arterial Doppler study which was abnormal and showed an ABI of 0.63 on the left and 1.0 on the right.  The left common iliac, external iliac, common femoral artery and profunda demonstrated multiphasic flow.  However, the superficial femoral artery on the left demonstrated occlusive disease in the proximal to mid thigh with reconstitution of flow in the mid distal segment.  The popliteal artery demonstrated patent monophasic flow with three-vessel runoff.  The anterior tibial artery demonstrated focal stenosis in the proximal calf with dampened flow distal to this.  She presented to [facility] hospital in August 2016 with complaints of palpitations, shortness of breath and presyncope.  She was noted to be in atrial fibrillation with rapid ventricular response.  Her heart rate was initially attempted  to be controlled with metoprolol and digitoxin, which was not successful and ultimately sotalol was started.  On 04/20/2015 she underwent successful TEE guided cardioversion.  She was continued on metoprolol as well as eliquis for anticoagulation.  An echo Doppler study on 04/18/2015 showed an EF of 55-60% with mild LVH.  She has multiple allergies and in the past did not tolerate ACE-inhibition or ARB therapy and was able to tolerate direct renin inhibition.  She has a history of GERD and has been taking ranitidine and sucralfate.  She has a history of hyperlipidemia and has had issues with statins.  When I saw her in October 2017 she was maintaining sinus rhythm.  She  presented to the emergency room on 01/15/2017 with chest tightness, shortness of breath and palpitations.  She was found to be back in atrial fibrillation.  She has continued to take anticoagulation with eloquence 5 mg twice a day.  During that evaluation, she underwent successful cardioversion and sinus rhythm was restored with 200 J cardioversion.   When I saw her in May 2018, she was maintaining sinus rhythm.  I discussed the possibility of sleep apnea in the etiology of her recurrent AF but she did not want to pursue a sleep evaluation.  She underwent a follow-up echo Doppler study on 02/06/2017 which showed an EF of 60-65%.  There was moderate LVH, grade 2 diastolic dysfunction, and she was without major valvular abnormalities.  She developed recurrent atrial fibrillation with mild chest pain.  She failed 2 attempts to cardioversion in the emergency room at 200 J.  She ultimately was seen  by [name] in consultation and although her QTc was mildly prolonged, he felt that it was stable.  As result sotalol was increased to 120 mg twice a day.  2 days later on 07/10/2017 she underwent successful cardioversion on the increased dose.  She has a maintaining normal rhythm since.  She hadn't atrial fibrillation clinic follow-up on 07/17/2017  and her ECG showed sinus rhythm at 60 bpm.  She has continued to be on sotalol 120 twice a day, metoprolol 25 mg twice a day, and eliquis 5 mg twice a day for cha2ds2score of at least 5.  Of note, upon further questioning, she has had issues recently where food gets stuck in her esophagus.  Remotely she had undergone esophageal dilatations in 2015.   When I saw her in June 2019  she denied any awareness of recurrent atrial fibrillation.  She has peripheral neuropathy of her feet.  She had undergone  laboratory by her primary physician which showed a cholesterol 229, triglycerides 340, VLDL 68, and direct LDL 150.  He had issues with numerous statins and during that evaluation I recommended a trial of Livalo and provided her with samples of 1 mg daily for 1 month with plans to titrate to 2 mg..  She subsequently was seen by Holly Reining, NP as well as [name] pharmacist and there was some discussion concerning PCSK9 inhibition.  However due to cost issues she preferred to continue to try the Livalo.  She has tolerated this well with improvement in her LVH but unfortunately the cost has been significant.  I saw her in June 2020.  At that time she was taking Livalo 2 mg daily and lipid studies were significantly improved with total cholesterol 168, LDL 72, HDL 54.    I saw her in January 2021 and since her prior evaluation she had remained stable from a cardiac standpoint.  She was unaware of any recurrent episodes of atrial fibrillation. She presented to the emergency room in August 2020 with right shoulder pain.  She had previously experienced chronic issues with the pain had exacerbated.  She was found to have several tears.  There was some discussion concerning scheduling her for right shoulder surgery with [name].  However over the past several months, her shoulder discomfort has improved and surgery is therefore not scheduled.  When she presented to the emergency room, her blood pressure was  significantly elevated at 198/83.  She had seen her primary physician and November 2020 and at that time her blood pressure was elevated at 158/86.  At times she does note some mild ankle swelling.   I saw her on May 25, 2020 and since her prior evaluation she continued to do well.  She denied any chest pain and was unaware of any recurrent atrial fibrillation.  She continued to be on Xarelto but now that she is in the donut hole the cost is very expensive.  She has been on finasteride to potentially help with some mild hair loss.  She is on metoprolol tartrate 37.5 mg in the morning and 25 mg at night in addition to sotalol 120 mg twice a day and chlorthalidone 25 mg daily.  She has neuropathy on gabapentin.    I saw her on July 10, 2021. She was unaware of any recurrent atrial fibrillation.  She admits to increased stress at the household.  Her husband has stage IV lung CA with increasing dementia.  She has noticed some mild swelling in her right greater  than left ankle.  Her blood pressure has remained stable.  She had undergone laboratory in September 2022 hemoglobin hematocrit were stable.  LFTs were normal.  LDL cholesterol was 95 with triglycerides 159 and total cholesterol 180.  TSH was normal at 1.39.  Renal function was stable with creatinine of 0.85.    Her husband unfortunately died on September 14, 2021.  She was evaluated in the emergency room in May 2023 with chest tightness weakness and shortness of breath in the setting of recurrent atrial fibrillation.  She underwent successful cardioversion.  She was seen by Juanda Crumble, PA-C on Jan 09, 2022 for posthospital follow-up and was doing well.  She returned to the emergency room on July 02, 2022 with mild chest tightness.  Troponins were negative.  Chest x-ray showed bronchitic changes.  A subsequent echocardiogram on May 21, 2022 showed an EF of 60 to 65% with mild LVH.  She had normal pulmonary artery systolic pressure.   There was mild MR.  There was mild aortic sclerosis without stenosis.  She subsequently was she  evaluated by Azalee Course, PA on June 06, 2022 and remained stable.  She was on Xarelto and sotalol without recurrent AF.  I last saw her on October 23, 2022 at which time she felt well.   She sees Dr. Sanda Linger for primary care.  She denies any chest pain.  She is unaware of recurrent atrial fibrillation.  She continues to be on chlorthalidone 25 mg, metoprolol tartrate 37.5 mg twice daily, and sotalol 120 mg twice a day for blood pressure and PAF.  She continues to be on Livalo 2 mg daily for hyperlipidemia.  She takes gabapentin as needed for nerve pain.    Since I last saw her, Ms. Kirkeby has remained stable.  She denies chest pain.  At times she has noticed some mild shortness of breath with walking.  She has not tolerated statins.  She is unaware of recurrent atrial fibrillation.  She continues to be on metoprolol tartrate 37.5 mg in the morning and 25 mg at night she takes torsemide 10 mg in the morning and continues to take sotalol 120 mg twice a day.  She is anticoagulated on reduced dose Xarelto at 15 mg.  She presents for yearly evaluation.  Past Medical History:  Diagnosis Date   Abnormal nuclear stress test    mild anterolateral septal and inferior ischemia   Arthritis    Atherosclerosis of lower extremity with claudication (HCC) 04/17/2015   LEFT LEG   Atrial fibrillation (HCC)    Cardiac arrhythmia due to congenital heart disease    Claudication [facility])    Colon cancer (HCC)    Coronary atherosclerosis of unspecified type of vessel, native or graft    Esophageal stricture    Fatty liver 11/05/2011   Hiatal hernia    HLD (hyperlipidemia)    Hypertension    IBS (irritable bowel syndrome)    Iron deficiency anemia, unspecified    Ischemic colitis (HCC)    Neuropathy    Osteoporosis    PAF (paroxysmal atrial fibrillation) (HCC) 04/2015   Ulcerative colitis (HCC)    Unspecified  vascular insufficiency of intestine     Past Surgical History:  Procedure Laterality Date   abdominoperineal resection anastomotic stricture  05/03/2007   APPENDECTOMY     CARDIAC CATHETERIZATION N/A 03/13/2015   Procedure: Left Heart Cath and Coronary Angiography;  Surgeon: Lennette Bihari, MD;  Location: [facility] INVASIVE CV LAB;  Service: Cardiovascular;  Laterality: N/A;   CARDIOVERSION N/A 04/20/2015   Procedure: CARDIOVERSION;  Surgeon: Jake Bathe, MD;  Location: [facility] ENDOSCOPY;  Service: Cardiovascular;  Laterality: N/A;   CARDIOVERSION N/A 07/10/2017   Procedure: CARDIOVERSION;  Surgeon: Jake Bathe, MD;  Location: [facility] ENDOSCOPY;  Service: Cardiovascular;  Laterality: N/A;   COLON SURGERY     DILATION AND CURETTAGE OF UTERUS  09/01/1990   ESOPHAGOGASTRODUODENOSCOPY N/A 04/27/2014   Procedure: ESOPHAGOGASTRODUODENOSCOPY (EGD);  Surgeon: Hart Carwin, MD;  Location: [facility] ENDOSCOPY;  Service: Endoscopy;  Laterality: N/A;   Mandibular Renstruction     ROTATOR CUFF REPAIR  09/01/2004   left   Rt. Salpingo oophorectomy and cyst removal  09/02/2003   SAVORY DILATION N/A 04/27/2014   Procedure: SAVORY DILATION;  Surgeon: Hart Carwin, MD;  Location: [facility] ENDOSCOPY;  Service: Endoscopy;  Laterality: N/A;  no xray needed   sigmoid resection for invasive rectal adenocarcinoma  12/31/2006   TEE WITHOUT CARDIOVERSION N/A 04/20/2015   Procedure: TRANSESOPHAGEAL ECHOCARDIOGRAM (TEE);  Surgeon: Jake Bathe, MD;  Location: [facility] ENDOSCOPY;  Service: Cardiovascular;  Laterality: N/A;   TUBAL LIGATION     WRIST SURGERY  09/01/2006   right    Allergies  Allergen Reactions   Crestor [Rosuvastatin Calcium] Other (See Comments)    Muscle aches   Acetaminophen-Codeine Other (See Comments)    Unknown reaction - possibly nausea   Amlodipine Besylate Swelling    Hands and feet swelling   Hydrocodone-Acetaminophen Nausea And Vomiting   Irbesartan-Hydrochlorothiazide Other (See Comments)    REACTION:  Dizziness   Lisinopril Cough   Omeprazole Nausea Only and Other (See Comments)    Dizziness, joint pain, weakness in legs, cramps in legs, stomach burned   Propoxyphene N-Acetaminophen Other (See Comments)    Headache, and blotchy face   Valsartan Other (See Comments)    REACTION: blurred vision   Warfarin Sodium Other (See Comments)    REACTION: body aches and bleeding   Diltiazem Hcl Rash   Verapamil Nausea Only, Palpitations and Other (See Comments)    Headaches     Current Outpatient Medications  Medication Sig Dispense Refill   acetaminophen (TYLENOL) 500 MG tablet Take 1,000 mg by mouth every 6 (six) hours as needed for headache (pain).     allopurinol (ZYLOPRIM) 100 MG tablet Take 1 tablet (100 mg total) by mouth daily. 90 tablet 0   Calcium Carbonate-Vitamin D (CALCIUM-D PO) Take 1 tablet by mouth at bedtime. Calcium 630 mg, Vitamin D3 500 units     colchicine 0.6 MG tablet Take 1 tablet (0.6 mg total) by mouth 2 (two) times daily. 180 tablet 0   cyanocobalamin 2000 MCG tablet Take 1 tablet (2,000 mcg total) by mouth every other day. (Patient taking differently: Take 2,000 mcg by mouth daily.) 45 tablet 1   diclofenac sodium (VOLTAREN) 1 % GEL Apply 2 g topically 4 (four) times daily. 100 g 1   Dietary Management Product (FOSTEUM PLUS) CAPS Take 1 capsule by mouth 2 (two) times daily. 180 capsule 2   ezetimibe (ZETIA) 10 MG tablet Take 1 tablet (10 mg total) by mouth daily. 90 tablet 3   famotidine (PEPCID) 40 MG tablet TAKE 1 TABLET AT BEDTIME 90 tablet 3   finasteride (PROPECIA) 1 MG tablet TAKE 1 TABLET AT BEDTIME 90 tablet 1   gabapentin (NEURONTIN) 100 MG capsule Take 1 capsule (100 mg total) by mouth 3 (three) times daily. (Patient taking differently: Take 100 mg by mouth daily. At night  time) 270 capsule 1   metoprolol tartrate (LOPRESSOR) 25 MG tablet TAKE 1 AND 1/2 TABLETS IN THE MORNING AND TAKE 1 TABLET AT BEDTIME 225 tablet 3   Peppermint Oil (IBGARD) 90 MG CPCR 1  capsule three times daily as needed     polyethylene glycol powder (MIRALAX) 17 GM/SCOOP powder 1/2 capful daily. 255 g 0   potassium chloride (KLOR-CON M) 10 MEQ tablet TAKE 2 TABLETS EVERY DAY (DOSE INCREASED DUE TO HYPOKALEMIA) 180 tablet 0   Rivaroxaban (XARELTO) 15 MG TABS tablet Take 1 tablet (15 mg total) by mouth daily with supper. 90 tablet 0   sotalol (BETAPACE) 120 MG tablet TAKE 1 TABLET EVERY 12 HOURS 180 tablet 1   torsemide (DEMADEX) 10 MG tablet Take 1 tablet (10 mg total) by mouth 2 (two) times daily. (Patient taking differently: Take 10 mg by mouth daily.) 180 tablet 0   traMADol (ULTRAM) 50 MG tablet Take 1 tablet (50 mg total) by mouth every 6 (six) hours as needed. 90 tablet 5   Wheat Dextrin (BENEFIBER) POWD 1 tablespoon 2 to 3 times daily with meals (Patient taking differently: Take by mouth daily in the afternoon. 1 tablespoon 2 to 3 times daily with meals) 30 g 0   No current facility-administered medications for this visit.    Social History   Socioeconomic History   Marital status: Widowed    Spouse name: Not on file   Number of children: 4   Years of education: 12th grade   Highest education level: Not on file  Occupational History   Occupation: retired    Associate Professor: RETIRED  Tobacco Use   Smoking status: Never    Passive exposure: Never   Smokeless tobacco: Never  Vaping Use   Vaping status: Never Used  Substance and Sexual Activity   Alcohol use: No   Drug use: No   Sexual activity: Not Currently    Partners: Male    Comment: widowed  Other Topics Concern   Not on file  Social History Narrative   Lives at home with fiance.   Right-handed.   No caffeine use.   Social Drivers of Corporate investment banker Strain: Low Risk  (11/13/2021)   Overall Financial Resource Strain (CARDIA)    Difficulty of Paying Living Expenses: Not hard at all  Food Insecurity: No Food Insecurity (11/13/2021)   Hunger Vital Sign    Worried About Running Out of Food  in the Last Year: Never true    Ran Out of Food in the Last Year: Never true  Transportation Needs: No Transportation Needs (11/13/2021)   PRAPARE - Administrator, Civil Service (Medical): No    Lack of Transportation (Non-Medical): No  Physical Activity: Inactive (11/13/2021)   Exercise Vital Sign    Days of Exercise per Week: 0 days    Minutes of Exercise per Session: 0 min  Stress: No Stress Concern Present (11/13/2021)   [employer] of Occupational Health - Occupational Stress Questionnaire    Feeling of Stress : Not at all  Social Connections: Moderately Integrated (11/13/2021)   Social Connection and Isolation Panel [NHANES]    Frequency of Communication with Friends and Family: More than three times a week    Frequency of Social Gatherings with Friends and Family: More than three times a week    Attends Religious Services: More than 4 times per year    Active Member of [employer] or Organizations: Yes    Attends [employer]  or Organization Meetings: More than 4 times per year    Marital Status: Widowed  Intimate Partner Violence: Not At Risk (11/13/2021)   Humiliation, Afraid, Rape, and Kick questionnaire    Fear of Current or Ex-Partner: No    Emotionally Abused: No    Physically Abused: No    Sexually Abused: No    Family History  Problem Relation Age of Onset   Colon cancer Father 73   Heart disease Father    Hypertension Father    Alzheimer's disease Mother    Thyroid disease Mother    Hypertension Sister    Thyroid disease Sister    Mitral valve prolapse Sister    Hypertension Son    Diabetes Son    Hypertension Son    Diabetes Son    Esophageal cancer Neg Hx    Rectal cancer Neg Hx    Stomach cancer Neg Hx    Social history  is notable that she is widowed. She has 3 children and 9 grandchildren. She remains active. There is no alcohol tobacco use.   ROS General: Negative; No fevers, chills, or night sweats; positive for fatigue and shortness of  breath HEENT: Negative; No changes in vision or hearing, sinus congestion, difficulty swallowing Pulmonary: Negative; No cough, wheezing, hemoptysis Cardiovascular: Positive for PAF, status post cardioversion Positive for left leg claudication GI: Positive for GERD; dysphagia GU: Negative; No dysuria, hematuria, or difficulty voiding Musculoskeletal: Right shoulder muscle tears Hematologic/Oncology: Negative; no easy bruising, bleeding Endocrine: Negative; no heat/cold intolerance; no diabetes Neuro: Positive for peripheral neuropathy Skin: Negative; No rashes or skin lesions Psychiatric: Negative; No behavioral problems, depression Sleep: Negative; No snoring, daytime sleepiness, hypersomnolence, bruxism, restless legs, hypnogognic hallucinations, no cataplexy Other comprehensive 14 point system review is negative.   PE BP 139/73   Pulse 62   Ht 5\' 6"  (1.676 m)   Wt 192 lb (87.1 kg)   SpO2 96%   BMI 30.99 kg/m    Repeat BP by me: 140/78  Wt Readings from Last 3 Encounters:  10/23/23 192 lb (87.1 kg)  09/22/23 189 lb 9.6 oz (86 kg)  05/19/23 190 lb (86.2 kg)   General: Alert, oriented, no distress.  Skin: normal turgor, no rashes, warm and dry HEENT: Normocephalic, atraumatic. Pupils equal round and reactive to light; sclera anicteric; extraocular muscles intact;  Nose without nasal septal hypertrophy Mouth/Parynx benign; Mallinpatti scale 3 Neck: No JVD, no carotid bruits; normal carotid upstroke Lungs: clear to ausculatation and percussion; no wheezing or rales Chest wall: without tenderness to palpitation Heart: PMI not displaced, RRR, s1 s2 normal, 1/6 systolic murmur, no diastolic murmur, no rubs, gallops, thrills, or heaves Abdomen: soft, nontender; no hepatosplenomehaly, BS+; abdominal aorta nontender and not dilated by palpation. Back: no CVA tenderness Pulses 2+ Musculoskeletal: full range of motion, normal strength, no joint deformities Extremities: no clubbing  cyanosis or edema, Homan's sign negative  Neurologic: grossly nonfocal; Cranial nerves grossly wnl Psychologic: Normal mood and affect    EKG Interpretation Date/Time:  Friday October 23 2023 13:27:51 EST Ventricular Rate:  62 PR Interval:  180 QRS Duration:  58 QT Interval:  416 QTC Calculation: 422 R Axis:   -13  Text Interpretation: Normal sinus rhythm Anterior infarct , age undetermined When compared with ECG of 15-Mar-2023 09:45, Questionable change in QRS duration Anterior infarct is now Present Confirmed by Nicki Guadalajara ([id number]) on 10/23/2023 1:38:38 PM    October 23, 2022  ECG (independently read by me):  Sinus bradycardia  at 30, low voltage   July 10, 2021 ECG (independently read by me):  Low atrial rhythm at 60, QTc 468 msec, no ectopy  May 25, 2020 ECG (independently read by me): Probable low atrial rhythm with negative P waves in leads II and III. Heart rate 55 bpm. No ectopy.  QTc interval 470 ms.  January 2021 ECG (independently read by me): Normal sinus rhythm at 61 bpm.  PR interval 170 ms, QTc interval 479 ms  June 2020 ECG (independently read by me): Sinus bradycardia at 52 bpm.  QTc interval 453 ms on sotalol;  no significant ST-T changes  June 2019 ECG (independently read by me): Sinus bradycardia 56 bpm.  QTc interval 474 ms.  December 2018 ECG (independently read by me): Normal sinus rhythm at 60 bpm.  QTc interval 454 ms.  No ST segment changes.  October 2018 ECG (independently read by me): Normal sinus rhythm at 62 bpm.  No significant ST-T changes.  No ectopy.  QTc interval 452 ms.  May 2018 ECG (independently read by me): Sinus bradycardia 59 bpm.  Low voltage.  Normal intervals.  No ST segment changes.  October 2017 ECG (independently read by me): Sinus bradycardia 57 bpm.  No ectopy.  Normal intervals.  November 2016 ECG (independently read by me): Sinus bradycardia 59 bpm.  No ectopy.  Normal intervals.  QTC 457 ms.  05/22/2015 ECG  (independently read by me): Normal sinus rhythm at 60 bpm.  QTc interval 454 ms.  No significant ST segment changes.  June 2016 ECG (independently read by me): Normal sinus rhythm at 63 bpm.  Poor precordial R-wave progression.  No ectopy.  November 2015 ECG (independently read by me);  Normal sinus rhythm at 59 bpm.  QTc interval 415 ms.  No significant ST segment changes.  Prior November 2014ECG: Sinus rhythm at 63 beats per minute. Normal intervals.  LABS:    Latest Ref Rng & Units 09/22/2023   12:15 PM 02/16/2023   10:21 AM 05/02/2022   10:50 AM  BMP  Glucose 70 - 99 mg/dL 161  096  045   BUN 6 - 23 mg/dL 20  20  18    Creatinine 0.40 - 1.20 mg/dL 4.09  8.11  9.14   Sodium 135 - 145 mEq/L 142  139  141   Potassium 3.5 - 5.1 mEq/L 4.5  3.7  3.5   Chloride 96 - 112 mEq/L 106  100  104   CO2 19 - 32 mEq/L 30  31  28    Calcium 8.4 - 10.5 mg/dL 78.2  95.6  9.8       Latest Ref Rng & Units 09/22/2023   12:15 PM 04/09/2022   11:20 AM 05/15/2021    2:14 PM  Hepatic Function  Total Protein 6.0 - 8.3 g/dL 7.5  7.5  7.2   Albumin 3.5 - 5.2 g/dL 4.4  4.4  4.1   AST 0 - 37 U/L 18  19  20    ALT 0 - 35 U/L 13  14  14    Alk Phosphatase 39 - 117 U/L 72  50  45   Total Bilirubin 0.2 - 1.2 mg/dL 0.9  1.1  1.2   Bilirubin, Direct 0.0 - 0.3 mg/dL 0.0   0.2       Latest Ref Rng & Units 09/22/2023   12:15 PM 02/16/2023   10:21 AM 05/02/2022   10:50 AM  CBC  WBC 4.0 - 10.5 K/uL 5.8  5.2  4.9  Hemoglobin 12.0 - 15.0 g/dL 96.2  95.2  84.1   Hematocrit 36.0 - 46.0 % 42.1  43.4  44.3   Platelets 150.0 - 400.0 K/uL 201.0  204.0  182    Lab Results  Component Value Date   MCV 85.2 09/22/2023   MCV 88.9 02/16/2023   MCV 93.1 05/02/2022   Lab Results  Component Value Date   TSH 1.43 02/16/2023   Lab Results  Component Value Date   HGBA1C 5.5 12/18/2016   Lipid Panel     Component Value Date/Time   CHOL 232 (H) 09/22/2023 1215   CHOL 184 09/14/2019 0944   TRIG 210.0 (H) 09/22/2023 1215    HDL 50.50 09/22/2023 1215   HDL 54 09/14/2019 0944   CHOLHDL 5 09/22/2023 1215   VLDL 42.0 (H) 09/22/2023 1215   LDLCALC 140 (H) 09/22/2023 1215   LDLCALC 99 09/14/2019 0944   LDLDIRECT 150.0 12/28/2017 1340    RADIOLOGY: US Breast Right  07/04/2013   CLINICAL DATA:  Abnormal screening right mammogram.  EXAM: DIGITAL DIAGNOSTIC  right MAMMOGRAM  ULTRASOUND right BREAST  COMPARISON:  With prior exams.  ACR Breast Density Category b: There are scattered areas of fibroglandular density.  FINDINGS: Spot compression views of the lateral aspect of the right breast were performed. There is persistence of a 4 mm low-density nodule in the posterior 3rd of the breast. There are no malignant type microcalcifications.  On physical exam I do not palpate a mass in the right breast.  Ultrasound is performed, showing there is a near anechoic lesion in the right breast at 7 o'clock 8 cm from the nipple measuring 3 x 2 x 3 mm.  IMPRESSION: Probable benign lesion in the right breast.  RECOMMENDATION: Short-term interval followup right mammogram and ultrasound in 6 months is recommended to document stability.  I have discussed the findings and recommendations with the patient. Results were also provided in writing at the conclusion of the visit. If applicable, a reminder letter will be sent to the patient regarding the next appointment.  BI-RADS CATEGORY  3: Probably benign finding(s) - short interval follow-up suggested.   Electronically Signed   By: Baird Lyons M.D.   On: 07/04/2013 09:01   Mm [employer] R  07/04/2013   CLINICAL DATA:  Abnormal screening right mammogram.  EXAM: DIGITAL DIAGNOSTIC  right MAMMOGRAM  ULTRASOUND right BREAST  COMPARISON:  With prior exams.  ACR Breast Density Category b: There are scattered areas of fibroglandular density.  FINDINGS: Spot compression views of the lateral aspect of the right breast were performed. There is persistence of a 4 mm low-density nodule in the posterior 3rd  of the breast. There are no malignant type microcalcifications.  On physical exam I do not palpate a mass in the right breast.  Ultrasound is performed, showing there is a near anechoic lesion in the right breast at 7 o'clock 8 cm from the nipple measuring 3 x 2 x 3 mm.  IMPRESSION: Probable benign lesion in the right breast.  RECOMMENDATION: Short-term interval followup right mammogram and ultrasound in 6 months is recommended to document stability.  I have discussed the findings and recommendations with the patient. Results were also provided in writing at the conclusion of the visit. If applicable, a reminder letter will be sent to the patient regarding the next appointment.  BI-RADS CATEGORY  3: Probably benign finding(s) - short interval follow-up suggested.   Electronically Signed   By: Cathe Mons.D.  On: 07/04/2013 09:01   IMPRESSION:  1. Essential hypertension   2. SOB (shortness of breath)   3. PAF (paroxysmal atrial fibrillation) (HCC)   4. PVD (peripheral vascular disease) (HCC)   5. Hyperlipidemia LDL goal <70   6. Mixed hyperlipidemia   7. Anticoagulated   8. Aortic valve sclerosis      ASSESSMENT AND PLAN: Ms. Manchester is a young appearing 89 year-old female who has a history of hypertension, paroxysmal atrial fibrillation, hyperlipidemia and peripheral vascular disease. She  is on Xarelto for anticoagulation and tolerating this well.  She developed recurrent AF and underwent successful cardioversion during her emergency room evaluation on 01/15/2017.  She developed recurrent A. fib despite taking metoprolol and sotalol 80 mg twice a day.  She had 2 failed attempts in the emergency room in early November when she presented with AF with RVR.  Ultimately, her sotalol dose was increased to 120 twice a day and she was able to be successfully cardioverted.  She developed recurrent atrial fibrillation in May 2023 and underwent another successful cardioversion.  Her echo following a September  2023 hospitalization showed normal EF at 60 to 65% without wall motion abnormalities and with normal diastolic parameters.  She had normal pulmonary artery systolic pressure.  There was mild MR and mild aortic valve sclerosis without stenosis.  Over the past several years she had been under increased stress with her husband's illness who ultimately died on 09-22-2021.  Presently, she has been without recurrent atrial fibrillation.  She has continued to see Dr. Sanda Linger for primary care.  Laboratory in September 22, 2023 showed stable CBC although eosinophils were slightly increased at 7.1%.  Renal function was normal with BUN 20 creatinine 0.86.  Lipid studies were elevated with total cholesterol 232 triglycerides 210 HDL 50.5 VLDL 42 and LDL 140.  Apparently she is no longer taking Livalo.  I have at least recommended initiation of Zetia 10 mg which I believe she will tolerate.  She will be turning 89 years old in April.  I have suggested a follow-up echo Doppler study with her mild shortness of breath with walking.  She has continued to be on anticoagulation with Xarelto.  She has previously documented PVD with reduced ABI in the left with occlusive disease in the proximal to mid left superficial femoral artery with mono phasic flow and three-vessel runoff in the popliteal anterior tibial arteries.  Currently she is not having any claudication symptoms.  I discussed my plans for retirement later this year.  I will transition her to the care of Dr. Epifanio Lesches.     Lennette Bihari, MD, [facility]  10/25/2023 1:10 PM

## 2023-10-25 ENCOUNTER — Encounter: Payer: Self-pay | Admitting: Cardiovascular Disease

## 2023-11-03 DIAGNOSIS — L821 Other seborrheic keratosis: Secondary | ICD-10-CM | POA: Diagnosis not present

## 2023-11-03 DIAGNOSIS — L720 Epidermal cyst: Secondary | ICD-10-CM | POA: Diagnosis not present

## 2023-11-08 ENCOUNTER — Other Ambulatory Visit: Payer: Self-pay | Admitting: Cardiovascular Disease

## 2023-11-19 DIAGNOSIS — H538 Other visual disturbances: Secondary | ICD-10-CM | POA: Diagnosis not present

## 2023-11-19 DIAGNOSIS — Z961 Presence of intraocular lens: Secondary | ICD-10-CM | POA: Diagnosis not present

## 2023-11-27 ENCOUNTER — Ambulatory Visit (HOSPITAL_COMMUNITY)
Admission: RE | Admit: 2023-11-27 | Discharge: 2023-11-27 | Disposition: A | Discharge status: Home / Self Care | Payer: Medicare HMO | Source: Ambulatory Visit | Attending: Cardiovascular Disease | Admitting: Cardiovascular Disease

## 2023-11-27 DIAGNOSIS — R0602 Shortness of breath: Secondary | ICD-10-CM | POA: Insufficient documentation

## 2023-11-27 LAB — ECHOCARDIOGRAM COMPLETE
AR max vel: 1.49 cm2
AV Area VTI: 1.41 cm2
AV Area mean vel: 1.46 cm2
AV Mean grad: 2 mmHg
AV Peak grad: 3.1 mmHg
Ao pk vel: 0.88 m/s
Area-P 1/2: 3.6 cm2
MV M vel: 4.36 m/s
MV Peak grad: 76 mmHg
MV VTI: 0.18 cm2
Radius: 0.17 cm
S' Lateral: 2.49 cm

## 2023-12-01 ENCOUNTER — Other Ambulatory Visit: Payer: Self-pay | Admitting: Internal Medicine

## 2023-12-01 DIAGNOSIS — I10 Essential (primary) hypertension: Secondary | ICD-10-CM

## 2023-12-04 DIAGNOSIS — M1711 Unilateral primary osteoarthritis, right knee: Secondary | ICD-10-CM | POA: Diagnosis not present

## 2023-12-07 ENCOUNTER — Encounter: Payer: Self-pay | Admitting: Physician Assistant

## 2023-12-07 ENCOUNTER — Ambulatory Visit: Payer: Medicare HMO | Admitting: Physician Assistant

## 2023-12-07 ENCOUNTER — Other Ambulatory Visit: Payer: Self-pay | Admitting: Internal Medicine

## 2023-12-07 VITALS — BP 136/74 | HR 51 | Ht 66.0 in | Wt 189.4 lb

## 2023-12-07 DIAGNOSIS — L659 Nonscarring hair loss, unspecified: Secondary | ICD-10-CM

## 2023-12-07 DIAGNOSIS — K219 Gastro-esophageal reflux disease without esophagitis: Secondary | ICD-10-CM

## 2023-12-07 DIAGNOSIS — M1A0720 Idiopathic chronic gout, left ankle and foot, without tophus (tophi): Secondary | ICD-10-CM

## 2023-12-07 DIAGNOSIS — R1310 Dysphagia, unspecified: Secondary | ICD-10-CM

## 2023-12-07 DIAGNOSIS — I480 Paroxysmal atrial fibrillation: Secondary | ICD-10-CM

## 2023-12-07 MED ORDER — PANTOPRAZOLE SODIUM 20 MG PO TBEC: 20.0000 mg | DELAYED_RELEASE_TABLET | Freq: Every day | ORAL | 11 refills | Status: DC

## 2023-12-07 NOTE — Progress Notes (Signed)
 Chief Complaint: "Hernia"  HPI:    [name] is a 89 year old female with a past medical history as listed below including A-fib, colon cancer, IBS and multiple others, known to Dr. Lavon Paganini, who was referred to me by Holly Grandchild, MD for a complaint of "hernia".      10/31/2022 patient seen in clinic by Dr. Lavon Paganini and at that time discussed chronic reflux, hernia, esophageal stricture status post dilation, sigmoid cancer status post sigmoid resection and anastomotic stricture.  At that time complaining of IBS, more regular with adjusting fiber and MiraLAX.  Overall doing well.  At that time told to continue Benefiber 1 tablespoon 1-3 times a day and MiraLAX half to 1 capful as well as IBgard 1 capsule up to 3 times a day as needed.  Continued on famotidine for reflux and FDgard 1 capsule up to 3 times a day for dyspepsia.    09/22/2023 CBC with eosinophils of 7.1 and otherwise normal, hepatic function panel normal, BMP normal.    Today, patient presents to clinic companied by her daughter and explains that she is having issues with dysphagia again last time she had an EGD in 2015 she had a stricture which was dilated but they told her they did not want to repeat due to her age.  She explains that over the past year or more she has had issues with feeling like food gets hung up deep in her throat more in her chest and oftentimes will have to go to the bathroom and regurgitate this, occasionally will go down by drinking water.  This happens about once a week at this point.  Currently on Famotidine 40 mg daily and does not have any reflux symptoms.  Previously tried Omeprazole which caused nausea and abdominal pain.    Denies fever, chills, weight loss, vomiting or symptoms that awaken her from sleep.  GI Hx; Echocardiogram 11/27/2023 LVEF 60-65% with mild grade 1 relaxation impairment mild mitral regurg and mild aortic valve sclerosis without stenosis.  Echocardiogram 05/21/2022 1 Left ventricular  ejection fraction, by estimation, is 60 to 65%. The left ventricle has normal function. The left ventricle has no regional wall motion abnormalities. There is mild left ventricular hypertrophy. Left ventricular diastolic parameters were normal.  2. Right ventricular systolic function is normal. The right ventricular size is normal. There is normal pulmonary artery systolic pressure.  3. The mitral valve is normal in structure. Mild mitral valve regurgitation. No evidence of mitral stenosis.  4. The aortic valve is normal in structure. Aortic valve regurgitation is trivial. Aortic valve sclerosis is present, with no evidence of aortic valve stenosis.  5. The inferior vena cava is normal in size with greater than 50% respiratory variability, suggesting right atrial pressure of 3 mmHg.    Cardiac Catheterization  Mid Cx lesion, 10% stenosed. The left ventricular systolic function is normal.   Normal LV function with an estimated ejection fraction of 55-60%.   No significant coronary obstructive disease with the midportion of the mid LAD dipping intramyocardially without evidence for systolic bridging, smooth 10% narrowing in the AV groove circumflex  coronary artery, and normal dominant RCA.     EGD July 19, 2014  distal esophageal stricture dilated from 14 to 18 mm prior to that EGD April 2014   Colonoscopy October 2012  showed minimal narrowing at sigmoid anastomosis at 20 cm.  No recall due to age.      Past Medical History:  Diagnosis Date   Abnormal  nuclear stress test    mild anterolateral septal and inferior ischemia   Arthritis    Atherosclerosis of lower extremity with claudication (HCC) 04/17/2015   LEFT LEG   Atrial fibrillation (HCC)    Cardiac arrhythmia due to congenital heart disease    Claudication [facility])    Colon cancer (HCC)    Coronary atherosclerosis of unspecified type of vessel, native or graft    Esophageal stricture    Fatty liver 11/05/2011   Hiatal  hernia    HLD (hyperlipidemia)    Hypertension    IBS (irritable bowel syndrome)    Iron deficiency anemia, unspecified    Ischemic colitis (HCC)    Neuropathy    Osteoporosis    PAF (paroxysmal atrial fibrillation) (HCC) 04/2015   Ulcerative colitis (HCC)    Unspecified vascular insufficiency of intestine     Past Surgical History:  Procedure Laterality Date   abdominoperineal resection anastomotic stricture  05/03/2007   APPENDECTOMY     CARDIAC CATHETERIZATION N/A 03/13/2015   Procedure: Left Heart Cath and Coronary Angiography;  Surgeon: Lennette Bihari, MD;  Location: [facility] INVASIVE CV LAB;  Service: Cardiovascular;  Laterality: N/A;   CARDIOVERSION N/A 04/20/2015   Procedure: CARDIOVERSION;  Surgeon: Jake Bathe, MD;  Location: [facility] ENDOSCOPY;  Service: Cardiovascular;  Laterality: N/A;   CARDIOVERSION N/A 07/10/2017   Procedure: CARDIOVERSION;  Surgeon: Jake Bathe, MD;  Location: [facility] ENDOSCOPY;  Service: Cardiovascular;  Laterality: N/A;   COLON SURGERY     DILATION AND CURETTAGE OF UTERUS  09/01/1990   ESOPHAGOGASTRODUODENOSCOPY N/A 04/27/2014   Procedure: ESOPHAGOGASTRODUODENOSCOPY (EGD);  Surgeon: Hart Carwin, MD;  Location: [facility] END[facility];  Service: Endoscopy;  Laterality: N/A;   Mandibular Renstruction     ROTATOR CUFF REPAIR  09/01/2004   left   Rt. Salpingo oophorectomy and cyst removal  09/02/2003   SAVORY DILATION N/A 04/27/2014   Procedure: SAVORY DILATION;  Surgeon: Hart Carwin, MD;  Location: [facility] ENDOSCOPY;  Service: Endoscopy;  Laterality: N/A;  no xray needed   sigmoid resection for invasive rectal adenocarcinoma  12/31/2006   TEE WITHOUT CARDIOVERSION N/A 04/20/2015   Procedure: TRANSESOPHAGEAL ECHOCARDIOGRAM (TEE);  Surgeon: Jake Bathe, MD;  Location: [facility] ENDOSCOPY;  Service: Cardiovascular;  Laterality: N/A;   TUBAL LIGATION     WRIST SURGERY  09/01/2006   right    Current Outpatient Medications  Medication Sig Dispense Refill   acetaminophen  (TYLENOL) 500 MG tablet Take 1,000 mg by mouth every 6 (six) hours as needed for headache (pain).     allopurinol (ZYLOPRIM) 100 MG tablet Take 1 tablet (100 mg total) by mouth daily. 90 tablet 0   Calcium Carbonate-Vitamin D (CALCIUM-D PO) Take 1 tablet by mouth at bedtime. Calcium 630 mg, Vitamin D3 500 units     colchicine 0.6 MG tablet Take 1 tablet (0.6 mg total) by mouth 2 (two) times daily. 180 tablet 0   cyanocobalamin 2000 MCG tablet Take 1 tablet (2,000 mcg total) by mouth every other day. (Patient taking differently: Take 2,000 mcg by mouth daily.) 45 tablet 1   diclofenac sodium (VOLTAREN) 1 % GEL Apply 2 g topically 4 (four) times daily. 100 g 1   Dietary Management Product (FOSTEUM PLUS) CAPS Take 1 capsule by mouth 2 (two) times daily. 180 capsule 2   ezetimibe (ZETIA) 10 MG tablet Take 1 tablet (10 mg total) by mouth daily. 90 tablet 3   famotidine (PEPCID) 40 MG tablet TAKE 1 TABLET AT BEDTIME  90 tablet 3   finasteride (PROPECIA) 1 MG tablet TAKE 1 TABLET AT BEDTIME 90 tablet 1   gabapentin (NEURONTIN) 100 MG capsule Take 1 capsule (100 mg total) by mouth 3 (three) times daily. (Patient taking differently: Take 100 mg by mouth daily. At night time) 270 capsule 1   metoprolol tartrate (LOPRESSOR) 25 MG tablet TAKE 1 AND 1/2 TABLETS IN THE MORNING AND TAKE 1 TABLET AT BEDTIME 225 tablet 3   Peppermint Oil (IBGARD) 90 MG CPCR 1 capsule three times daily as needed     polyethylene glycol powder (MIRALAX) 17 GM/SCOOP powder 1/2 capful daily. 255 g 0   potassium chloride (KLOR-CON M) 10 MEQ tablet TAKE 2 TABLETS EVERY DAY (DOSE INCREASED DUE TO HYPOKALEMIA) 180 tablet 3   Rivaroxaban (XARELTO) 15 MG TABS tablet Take 1 tablet (15 mg total) by mouth daily with supper. 90 tablet 0   sotalol (BETAPACE) 120 MG tablet TAKE 1 TABLET EVERY 12 HOURS 180 tablet 1   torsemide (DEMADEX) 10 MG tablet TAKE 1 TABLET BY MOUTH TWICE DAILY 180 tablet 0   traMADol (ULTRAM) 50 MG tablet Take 1 tablet (50 mg  total) by mouth every 6 (six) hours as needed. 90 tablet 5   Wheat Dextrin (BENEFIBER) POWD 1 tablespoon 2 to 3 times daily with meals (Patient taking differently: Take by mouth daily in the afternoon. 1 tablespoon 2 to 3 times daily with meals) 30 g 0   No current facility-administered medications for this visit.    Allergies as of 12/07/2023 - Review Complete 10/25/2023  Allergen Reaction Noted   Crestor [rosuvastatin calcium] Other (See Comments) 12/25/2016   Acetaminophen-codeine Other (See Comments)    Amlodipine besylate Swelling 11/09/2008   Hydrocodone-acetaminophen Nausea And Vomiting    Irbesartan-hydrochlorothiazide Other (See Comments) 11/09/2008   Lisinopril Cough    Omeprazole Nausea Only and Other (See Comments) 11/15/2014   Propoxyphene n-acetaminophen Other (See Comments)    Valsartan Other (See Comments) 11/09/2008   Warfarin sodium Other (See Comments) 11/09/2008   Diltiazem hcl Rash 11/09/2008   Verapamil Nausea Only, Palpitations, and Other (See Comments) 06/02/2011    Family History  Problem Relation Age of Onset   Colon cancer Father 68   Heart disease Father    Hypertension Father    Alzheimer's disease Mother    Thyroid disease Mother    Hypertension Sister    Thyroid disease Sister    Mitral valve prolapse Sister    Hypertension Son    Diabetes Son    Hypertension Son    Diabetes Son    Esophageal cancer Neg Hx    Rectal cancer Neg Hx    Stomach cancer Neg Hx     Social History   Socioeconomic History   Marital status: Widowed    Spouse name: Not on file   Number of children: 4   Years of education: 12th grade   Highest education level: Not on file  Occupational History   Occupation: retired    Associate Professor: RETIRED  Tobacco Use   Smoking status: Never    Passive exposure: Never   Smokeless tobacco: Never  Vaping Use   Vaping status: Never Used  Substance and Sexual Activity   Alcohol use: No   Drug use: No   Sexual activity: Not  Currently    Partners: Male    Comment: widowed  Other Topics Concern   Not on file  Social History Narrative   Lives at home with fiance.   Right-handed.  No caffeine use.   Social Drivers of Corporate investment banker Strain: Low Risk  (11/13/2021)   Overall Financial Resource Strain (CARDIA)    Difficulty of Paying Living Expenses: Not hard at all  Food Insecurity: No Food Insecurity (11/13/2021)   Hunger Vital Sign    Worried About Running Out of Food in the Last Year: Never true    Ran Out of Food in the Last Year: Never true  Transportation Needs: No Transportation Needs (11/13/2021)   PRAPARE - Administrator, Civil Service (Medical): No    Lack of Transportation (Non-Medical): No  Physical Activity: Inactive (11/13/2021)   Exercise Vital Sign    Days of Exercise per Week: 0 days    Minutes of Exercise per Session: 0 min  Stress: No Stress Concern Present (11/13/2021)   [employer] of Occupational Health - Occupational Stress Questionnaire    Feeling of Stress : Not at all  Social Connections: Moderately Integrated (11/13/2021)   Social Connection and Isolation Panel [NHANES]    Frequency of Communication with Friends and Family: More than three times a week    Frequency of Social Gatherings with Friends and Family: More than three times a week    Attends Religious Services: More than 4 times per year    Active Member of [employer] or Organizations: Yes    Attends Banker Meetings: More than 4 times per year    Marital Status: Widowed  Intimate Partner Violence: Not At Risk (11/13/2021)   Humiliation, Afraid, Rape, and Kick questionnaire    Fear of Current or Ex-Partner: No    Emotionally Abused: No    Physically Abused: No    Sexually Abused: No    Review of Systems:    Constitutional: No weight loss, fever or chills  Cardiovascular: No chest pain Respiratory: No SOB  Gastrointestinal: See HPI and otherwise negative   Physical Exam:   Vital signs: BP 136/74   Pulse (!) 51   Ht 5\' 6"  (1.676 m)   Wt 189 lb 6 oz (85.9 kg)   SpO2 97%   BMI 30.57 kg/m    Constitutional:   Pleasant elderly Caucasian female appears to be in NAD, Well developed, Well nourished, alert and cooperative Respiratory: Respirations even and unlabored. Lungs clear to auscultation bilaterally.   No wheezes, crackles, or rhonchi.  Cardiovascular: Normal S1, S2. No MRG. Regular rate and rhythm. No peripheral edema, cyanosis or pallor.  Gastrointestinal:  Soft, nondistended, nontender. No rebound or guarding. Normal bowel sounds. No appreciable masses or hepatomegaly. Rectal:  Not performed.  Psychiatric: Oriented to person, place and time. Demonstrates good judgement and reason without abnormal affect or behaviors.  RELEVANT LABS AND IMAGING: CBC    Component Value Date/Time   WBC 5.8 09/22/2023 1215   RBC 4.94 09/22/2023 1215   HGB 13.5 09/22/2023 1215   HGB 13.4 09/14/2019 0944   HGB 15.0 04/09/2017 1345   HCT 42.1 09/22/2023 1215   HCT 41.9 09/14/2019 0944   HCT 45.1 04/09/2017 1345   PLT 201.0 09/22/2023 1215   PLT 193 09/14/2019 0944   MCV 85.2 09/22/2023 1215   MCV 88 09/14/2019 0944   MCV 92.0 04/09/2017 1345   MCH 31.3 05/02/2022 1050   MCHC 32.1 09/22/2023 1215   RDW 15.8 (H) 09/22/2023 1215   RDW 13.6 09/14/2019 0944   RDW 13.7 04/09/2017 1345   LYMPHSABS 1.8 09/22/2023 1215   LYMPHSABS 1.9 04/09/2017 1345   MONOABS 0.6 09/22/2023  1215   MONOABS 0.5 04/09/2017 1345   EOSABS 0.4 09/22/2023 1215   EOSABS 0.2 04/09/2017 1345   EOSABS 0.3 03/18/2012 1010   BASOSABS 0.0 09/22/2023 1215   BASOSABS 0.0 04/09/2017 1345    CMP     Component Value Date/Time   NA 142 09/22/2023 1215   NA 139 09/14/2019 0944   NA 143 04/09/2017 1345   K 4.5 09/22/2023 1215   K 3.5 04/09/2017 1345   CL 106 09/22/2023 1215   CL 108 09/26/2009 1013   CO2 30 09/22/2023 1215   CO2 31 (H) 04/09/2017 1345   GLUCOSE 103 (H) 09/22/2023 1215    GLUCOSE 93 04/09/2017 1345   GLUCOSE 103 09/26/2009 1013   BUN 20 09/22/2023 1215   BUN 19 09/14/2019 0944   BUN 16.5 04/09/2017 1345   CREATININE 0.86 09/22/2023 1215   CREATININE 0.8 04/09/2017 1345   CALCIUM 10.2 09/22/2023 1215   CALCIUM 9.9 04/09/2017 1345   PROT 7.5 09/22/2023 1215   PROT 6.8 09/14/2019 0944   PROT 7.2 04/09/2017 1345   ALBUMIN 4.4 09/22/2023 1215   ALBUMIN 4.4 09/14/2019 0944   ALBUMIN 3.9 04/09/2017 1345   AST 18 09/22/2023 1215   AST 26 04/09/2017 1345   ALT 13 09/22/2023 1215   ALT 21 04/09/2017 1345   ALKPHOS 72 09/22/2023 1215   ALKPHOS 57 04/09/2017 1345   BILITOT 0.9 09/22/2023 1215   BILITOT 0.9 09/14/2019 0944   BILITOT 1.21 (H) 04/09/2017 1345   GFRNONAA 59 (L) 05/02/2022 1050   GFRAA 71 09/14/2019 0944    Assessment: 1.  Dysphagia: Increasing symptoms over the past 1+ years, last EGD in 2015 with a stricture which was dilated; likely stricture +/- dysmotility 2.  GERD: Currently controlled on Famotidine 40 mg daily  Plan: 1.  Given the patient's increased age and concerns over repeat EGD will start with a barium esophagram with tablet.  Pending results we will consider EGD with Dr. Lavon Paganini. 2.  At this point stop Famotidine and started Pantoprazole 20 mg daily.  Patient did have reaction to Omeprazole in the past but has not been tried on a different PPI.  Explained that if she starts feeling bad she can go back to the Famotidine. 3.  Reviewed antidysphagia measures 4.  Patient to follow in clinic per recommendations after imaging.  Hyacinth Meeker, PA-C [facility] 12/07/2023, 10:27 AM  Cc: Holly Grandchild, MD

## 2023-12-07 NOTE — Patient Instructions (Signed)
 We have sent the following medications to your pharmacy for you to pick up at your convenience: pantoprazole 20 mg every day.   You have been scheduled for a Barium Esophogram at [facility] Radiology (1st floor of the hospital) on 12/17/23 at 11:00 am. Please arrive 30 minutes prior to your appointment for registration. Make certain not to have anything to eat or drink 3 hours prior to your test. If you need to reschedule for any reason, please contact radiology at [phone] to do so. __________________________________________________________________ A barium swallow is an examination that concentrates on views of the esophagus. This tends to be a double contrast exam (barium and two liquids which, when combined, create a gas to distend the wall of the oesophagus) or single contrast (non-ionic iodine based). The study is usually tailored to your symptoms so a good history is essential. Attention is paid during the study to the form, structure and configuration of the esophagus, looking for functional disorders (such as aspiration, dysphagia, achalasia, motility and reflux) EXAMINATION You may be asked to change into a gown, depending on the type of swallow being performed. A radiologist and radiographer will perform the procedure. The radiologist will advise you of the type of contrast selected for your procedure and direct you during the exam. You will be asked to stand, sit or lie in several different positions and to hold a small amount of fluid in your mouth before being asked to swallow while the imaging is performed .In some instances you may be asked to swallow barium coated marshmallows to assess the motility of a solid food bolus. The exam can be recorded as a digital or video fluoroscopy procedure. POST PROCEDURE It will take 1-2 days for the barium to pass through your system. To facilitate this, it is important, unless otherwise directed, to increase your fluids for the next 24-48hrs and to  resume your normal diet.  This test typically takes about 30 minutes to perform. __________________________________________________________________________________   If your blood pressure at your visit was 140/90 or greater, please contact your primary care physician to follow up on this.  _______________________________________________________  If you are age 75 or older, your body mass index should be between 23-30. Your Body mass index is 30.57 kg/m. If this is out of the aforementioned range listed, please consider follow up with your Primary Care Provider.  If you are age 40 or younger, your body mass index should be between 19-25. Your Body mass index is 30.57 kg/m. If this is out of the aformentioned range listed, please consider follow up with your Primary Care Provider.   ________________________________________________________  The [place] [facility] providers would like to encourage you to use [facility] to communicate with providers for non-urgent requests or questions.  Due to long hold times on the telephone, sending your provider a message by [facility] may be a faster and more efficient way to get a response.  Please allow 48 business hours for a response.  Please remember that this is for non-urgent requests.  _______________________________________________________

## 2023-12-17 ENCOUNTER — Ambulatory Visit (HOSPITAL_COMMUNITY)

## 2023-12-29 ENCOUNTER — Ambulatory Visit (HOSPITAL_COMMUNITY)
Admission: RE | Admit: 2023-12-29 | Discharge: 2023-12-29 | Disposition: A | Discharge status: Home / Self Care | Source: Ambulatory Visit | Attending: Physician Assistant | Admitting: Physician Assistant

## 2023-12-29 ENCOUNTER — Other Ambulatory Visit: Payer: Self-pay | Admitting: Physician Assistant

## 2023-12-29 DIAGNOSIS — K224 Dyskinesia of esophagus: Secondary | ICD-10-CM | POA: Diagnosis not present

## 2023-12-29 DIAGNOSIS — K219 Gastro-esophageal reflux disease without esophagitis: Secondary | ICD-10-CM

## 2023-12-29 DIAGNOSIS — K449 Diaphragmatic hernia without obstruction or gangrene: Secondary | ICD-10-CM | POA: Diagnosis not present

## 2023-12-29 DIAGNOSIS — R1310 Dysphagia, unspecified: Secondary | ICD-10-CM

## 2024-01-04 ENCOUNTER — Other Ambulatory Visit: Payer: Self-pay | Admitting: Cardiovascular Disease

## 2024-01-04 ENCOUNTER — Other Ambulatory Visit: Payer: Self-pay | Admitting: Gastroenterology

## 2024-01-04 DIAGNOSIS — I480 Paroxysmal atrial fibrillation: Secondary | ICD-10-CM

## 2024-01-04 DIAGNOSIS — I10 Essential (primary) hypertension: Secondary | ICD-10-CM

## 2024-01-06 ENCOUNTER — Telehealth: Payer: Self-pay | Admitting: Cardiovascular Disease

## 2024-01-06 DIAGNOSIS — I10 Essential (primary) hypertension: Secondary | ICD-10-CM

## 2024-01-06 DIAGNOSIS — I480 Paroxysmal atrial fibrillation: Secondary | ICD-10-CM

## 2024-01-06 MED ORDER — METOPROLOL TARTRATE 25 MG PO TABS
ORAL_TABLET | ORAL | 3 refills | Status: DC
Start: 2024-01-06 — End: 2024-06-10

## 2024-01-06 NOTE — Telephone Encounter (Signed)
 Pt's medication was sent to pt's pharmacy as requested. Confirmation received.

## 2024-01-06 NOTE — Telephone Encounter (Signed)
*  STAT* If patient is at the pharmacy, call can be transferred to refill team.   1. Which medications need to be refilled? (please list name of each medication and dose if known)   metoprolol  tartrate (LOPRESSOR ) 25 MG tablet    2. Which pharmacy/location (including street and city if local pharmacy) is medication to be sent to? [facility] - [place], [place] - [address] [address]   3. Do they need a 30 day or 90 day supply? 14  Patient is out of medication

## 2024-01-12 DIAGNOSIS — H9313 Tinnitus, bilateral: Secondary | ICD-10-CM | POA: Diagnosis not present

## 2024-01-12 DIAGNOSIS — H903 Sensorineural hearing loss, bilateral: Secondary | ICD-10-CM | POA: Diagnosis not present

## 2024-01-19 ENCOUNTER — Other Ambulatory Visit: Payer: Self-pay

## 2024-01-19 DIAGNOSIS — I480 Paroxysmal atrial fibrillation: Secondary | ICD-10-CM

## 2024-01-19 MED ORDER — SOTALOL HCL 120 MG PO TABS: 120.0000 mg | ORAL_TABLET | Freq: Two times a day (BID) | ORAL | 3 refills | Status: AC

## 2024-03-23 DIAGNOSIS — M1711 Unilateral primary osteoarthritis, right knee: Secondary | ICD-10-CM | POA: Diagnosis not present

## 2024-04-07 ENCOUNTER — Encounter: Payer: Self-pay | Admitting: Internal Medicine

## 2024-04-07 ENCOUNTER — Ambulatory Visit (INDEPENDENT_AMBULATORY_CARE_PROVIDER_SITE_OTHER): Admitting: Internal Medicine

## 2024-04-07 VITALS — BP 146/82 | HR 68 | Temp 98.5°F | Resp 16 | Ht 66.0 in | Wt 187.0 lb

## 2024-04-07 DIAGNOSIS — K219 Gastro-esophageal reflux disease without esophagitis: Secondary | ICD-10-CM

## 2024-04-07 DIAGNOSIS — M1A0720 Idiopathic chronic gout, left ankle and foot, without tophus (tophi): Secondary | ICD-10-CM | POA: Diagnosis not present

## 2024-04-07 DIAGNOSIS — E785 Hyperlipidemia, unspecified: Secondary | ICD-10-CM

## 2024-04-07 DIAGNOSIS — I10 Essential (primary) hypertension: Secondary | ICD-10-CM

## 2024-04-07 DIAGNOSIS — M75100 Unspecified rotator cuff tear or rupture of unspecified shoulder, not specified as traumatic: Secondary | ICD-10-CM | POA: Insufficient documentation

## 2024-04-07 DIAGNOSIS — E538 Deficiency of other specified B group vitamins: Secondary | ICD-10-CM | POA: Diagnosis not present

## 2024-04-07 DIAGNOSIS — I480 Paroxysmal atrial fibrillation: Secondary | ICD-10-CM

## 2024-04-07 DIAGNOSIS — M1990 Unspecified osteoarthritis, unspecified site: Secondary | ICD-10-CM | POA: Insufficient documentation

## 2024-04-07 LAB — CBC WITH DIFFERENTIAL/PLATELET
Basophils Absolute: 0 K/uL (ref 0.0–0.1)
Basophils Relative: 0.7 % (ref 0.0–3.0)
Eosinophils Absolute: 0.3 K/uL (ref 0.0–0.7)
Eosinophils Relative: 5.5 % — ABNORMAL HIGH (ref 0.0–5.0)
HCT: 43.1 % (ref 36.0–46.0)
Hemoglobin: 14.2 g/dL (ref 12.0–15.0)
Lymphocytes Relative: 31.6 % (ref 12.0–46.0)
Lymphs Abs: 1.5 K/uL (ref 0.7–4.0)
MCHC: 33 g/dL (ref 30.0–36.0)
MCV: 88.5 fl (ref 78.0–100.0)
Monocytes Absolute: 0.5 K/uL (ref 0.1–1.0)
Monocytes Relative: 10.8 % (ref 3.0–12.0)
Neutro Abs: 2.4 K/uL (ref 1.4–7.7)
Neutrophils Relative %: 51.4 % (ref 43.0–77.0)
Platelets: 197 K/uL (ref 150.0–400.0)
RBC: 4.87 Mil/uL (ref 3.87–5.11)
RDW: 14.8 % (ref 11.5–15.5)
WBC: 4.7 K/uL (ref 4.0–10.5)

## 2024-04-07 LAB — TSH: TSH: 1.41 u[IU]/mL (ref 0.35–5.50)

## 2024-04-07 LAB — VITAMIN B12: Vitamin B-12: 562 pg/mL (ref 211–911)

## 2024-04-07 LAB — FOLATE: Folate: >23.4 ng/mL (ref >5.9)

## 2024-04-07 MED ORDER — FAMOTIDINE 20 MG PO TABS: 20.0000 mg | ORAL_TABLET | Freq: Two times a day (BID) | ORAL | 1 refills | Status: DC

## 2024-04-07 NOTE — Progress Notes (Unsigned)
 Subjective:  Patient ID: [name], female    DOB: 01-May-1933  Age: 89 y.o. MRN: [id number]  CC: Osteoarthritis, Hypertension, Atrial Fibrillation, and Gastroesophageal Reflux   HPI [name] presents for f/up -----  Discussed the use of AI scribe software for clinical note transcription with the patient, who gave verbal consent to proceed.  History of Present Illness [name] is an 89 year old female who presents with fatigue and swallowing difficulties.  She experiences fatigue that recurs and affects her daily activities. A previous adjustment in her treatment had improved her condition, but the fatigue has since returned. She denies feeling sad or depressed but mentions feeling lonely despite having a supportive community at [facility]. She lives alone with her cat.  She has difficulty swallowing solid foods, such as meats and bread, which sometimes leads to choking or gagging every couple of days. A previous test revealed a small hernia, but it was not deemed problematic as the test involved liquids, which she swallows without issue. She experiences occasional heartburn and indigestion, which she manages with famotidine , taken daily due to a hernia. She previously experienced heart palpitations and shortness of breath after starting pantoprazole , which resolved after discontinuing the medication.  She has a history of knee pain due to cartilage loss, for which she takes Tylenol , finding relief with three tablets. She has received two cortisone shots in the past for this issue.  She is currently taking a B12 supplement as part of her health maintenance. No current palpitations, headache, blurred vision, chest pain, or shortness of breath.    Outpatient Medications Prior to Visit  Medication Sig Dispense Refill   acetaminophen  (TYLENOL ) 500 MG tablet Take 1,000 mg by mouth every 6 (six) hours as needed for headache (pain).     allopurinol  (ZYLOPRIM ) 100 MG  tablet TAKE 1 TABLET EVERY DAY 90 tablet 3   Calcium  Carbonate-Vitamin D  (CALCIUM -D PO) Take 1 tablet by mouth at bedtime. Calcium  630 mg, Vitamin D3 500 units     colchicine  0.6 MG tablet TAKE 1 TABLET TWICE DAILY 180 tablet 3   cyanocobalamin  2000 MCG tablet Take 1 tablet (2,000 mcg total) by mouth every other day. (Patient taking differently: Take 2,000 mcg by mouth daily.) 45 tablet 1   diclofenac  sodium (VOLTAREN ) 1 % GEL Apply 2 g topically 4 (four) times daily. 100 g 1   Dietary Management Product (FOSTEUM PLUS) CAPS Take 1 capsule by mouth 2 (two) times daily. 180 capsule 2   ezetimibe  (ZETIA ) 10 MG tablet Take 1 tablet (10 mg total) by mouth daily. 90 tablet 3   finasteride  (PROPECIA ) 1 MG tablet TAKE 1 TABLET AT BEDTIME 90 tablet 3   gabapentin  (NEURONTIN ) 100 MG capsule Take 1 capsule (100 mg total) by mouth 3 (three) times daily. (Patient taking differently: Take 100 mg by mouth as needed. At night time) 270 capsule 1   metoprolol  tartrate (LOPRESSOR ) 25 MG tablet TAKE 1 AND 1/2 TABLETS IN THE MORNING  AND TAKE 1 TABLET AT BEDTIME 225 tablet 3   Peppermint Oil (IBGARD) 90 MG CPCR 1 capsule three times daily as needed     polyethylene glycol powder (MIRALAX ) 17 GM/SCOOP powder 1/2 capful daily. 255 g 0   potassium chloride  (KLOR-CON  M) 10 MEQ tablet TAKE 2 TABLETS EVERY DAY (DOSE INCREASED DUE TO HYPOKALEMIA) 180 tablet 3   sotalol  (BETAPACE ) 120 MG tablet Take 1 tablet (120 mg total) by mouth every 12 (twelve) hours. 180  tablet 3   torsemide  (DEMADEX ) 10 MG tablet TAKE 1 TABLET BY MOUTH TWICE DAILY 180 tablet 0   traMADol  (ULTRAM ) 50 MG tablet Take 1 tablet (50 mg total) by mouth every 6 (six) hours as needed. 90 tablet 5   Wheat Dextrin (BENEFIBER) POWD 1 tablespoon 2 to 3 times daily with meals (Patient taking differently: Take by mouth daily in the afternoon. 1 tablespoon 2 to 3 times daily with meals) 30 g 0   XARELTO  15 MG TABS tablet TAKE 1 TABLET EVERY DAY WITH SUPPER 90 tablet 3    pantoprazole  (PROTONIX ) 20 MG tablet Take 1 tablet (20 mg total) by mouth daily. 30 tablet 11   No facility-administered medications prior to visit.    ROS Review of Systems  Constitutional:  Positive for fatigue. Negative for appetite change, diaphoresis and unexpected weight change.  Respiratory: Negative.  Negative for cough, chest tightness, shortness of breath and wheezing.   Cardiovascular:  Negative for chest pain, palpitations and leg swelling.  Gastrointestinal: Negative.  Negative for abdominal pain, constipation, diarrhea, nausea and vomiting.  Genitourinary: Negative.  Negative for difficulty urinating.  Musculoskeletal:  Positive for arthralgias. Negative for gait problem, joint swelling and myalgias.  Neurological:  Negative for dizziness, weakness and light-headedness.  Hematological:  Negative for adenopathy. Does not bruise/bleed easily.  Psychiatric/Behavioral: Negative.      Objective:  BP (!) 146/82 (BP Location: Left Arm, Patient Position: Sitting, Cuff Size: Normal)   Pulse 68   Temp 98.5 F (36.9 C) (Oral)   Resp 16   Ht 5' 6 (1.676 m)   Wt 187 lb (84.8 kg)   SpO2 97%   BMI 30.18 kg/m   BP Readings from Last 3 Encounters:  04/07/24 (!) 146/82  12/07/23 136/74  10/23/23 139/73    Wt Readings from Last 3 Encounters:  04/07/24 187 lb (84.8 kg)  12/07/23 189 lb 6 oz (85.9 kg)  10/23/23 192 lb (87.1 kg)    Physical Exam Vitals reviewed.  Constitutional:      Appearance: Normal appearance.  HENT:     Nose: Nose normal.     Mouth/Throat:     Mouth: Mucous membranes are moist.  Eyes:     General: No scleral icterus.    Conjunctiva/sclera: Conjunctivae normal.  Cardiovascular:     Rate and Rhythm: Regular rhythm. Bradycardia present.     Heart sounds: No murmur heard.    No friction rub. No gallop.     Comments: EKG--- SB (new), 59 bpm Inferior infarct pattern is not new No LVH Pulmonary:     Breath sounds: No stridor. No wheezing,  rhonchi or rales.  Abdominal:     General: Abdomen is flat.     Palpations: There is no mass.     Tenderness: There is no abdominal tenderness. There is no guarding.     Hernia: No hernia is present.  Musculoskeletal:     Cervical back: Neck supple.     Right lower leg: No edema.     Left lower leg: No edema.  Lymphadenopathy:     Cervical: No cervical adenopathy.  Skin:    General: Skin is warm and dry.  Neurological:     General: No focal deficit present.     Mental Status: She is alert. Mental status is at baseline.  Psychiatric:        Mood and Affect: Mood normal.        Behavior: Behavior normal.     Lab  Results  Component Value Date   WBC 4.7 04/07/2024   HGB 14.2 04/07/2024   HCT 43.1 04/07/2024   PLT 197.0 04/07/2024   GLUCOSE 94 04/07/2024   CHOL 232 (H) 09/22/2023   TRIG 210.0 (H) 09/22/2023   HDL 50.50 09/22/2023   LDLDIRECT 150.0 12/28/2017   LDLCALC 140 (H) 09/22/2023   ALT 13 09/22/2023   AST 18 09/22/2023   NA 141 04/07/2024   K 4.0 04/07/2024   CL 102 04/07/2024   CREATININE 0.99 04/07/2024   BUN 25 (H) 04/07/2024   CO2 27 04/07/2024   TSH 1.41 04/07/2024   INR 1.02 07/07/2017   HGBA1C 5.5 12/18/2016    DG ESOPHAGUS W DOUBLE CM (HD) Result Date: 12/29/2023 CLINICAL DATA:  Dysphagia. Food sticking in chest, with occasional regurgitation. Previous dilatation of esophageal strictures. EXAM: ESOPHOGRAM / BARIUM SWALLOW / BARIUM TABLET STUDY TECHNIQUE: Combined double contrast and single contrast examination performed using effervescent crystals, thick barium liquid, and thin barium liquid. The patient was observed with fluoroscopy swallowing a 13 mm barium sulphate tablet. FLUOROSCOPY: Radiation Exposure Index (as provided by the fluoroscopic device): 73.4 mGy Kerma COMPARISON:  None Available. FINDINGS: No evidence of vestibular penetration or aspiration during swallowing. Pharynx and cervical esophagus are unremarkable. No evidence of esophageal mass or  esophagitis. A tiny sliding hiatal hernia is seen. A small diverticulum is seen arising from the left lateral wall of the hiatal hernia, just below the GE junction. A mild Schatzki ring is seen at the GE junction, however, this does not impede passage of an ingested 13 mm barium tablet. Mild esophageal dysmotility is seen, with delayed and weak primary peristalsis in the mid and distal esophagus, with proximal escape of barium. No gastroesophageal reflux was seen despite provocative maneuvers. IMPRESSION: Tiny sliding hiatal hernia, with small diverticulum arising from its left lateral wall. Mild Schatzki ring at the GE junction, however, this does not impede passage of a 13 mm barium tablet. Mild esophageal dysmotility. Electronically Signed   By: Norleen DELENA Kil M.D.   On: 12/29/2023 14:48    Assessment & Plan:  Gastroesophageal reflux disease, unspecified whether esophagitis present -     CBC with Differential/Platelet; Future -     Famotidine ; Take 1 tablet (20 mg total) by mouth 2 (two) times daily.  Dispense: 90 tablet; Refill: 1  Dyslipidemia, goal LDL below 70 -     TSH; Future  Idiopathic chronic gout of left foot without tophus -     Basic metabolic panel with GFR; Future -     Uric acid; Future  Neuromyelopathy due to vitamin B12 deficiency (HCC) -     Folate; Future -     Vitamin B12; Future  Paroxysmal atrial fibrillation (HCC) - She has good R/R control. -     TSH; Future  Essential hypertension- Her BP is well controlled. -     Basic metabolic panel with GFR; Future -     TSH; Future -     EKG 12-Lead     Follow-up: Return in about 6 months (around 10/08/2024).  Debby Molt, MD

## 2024-04-07 NOTE — Patient Instructions (Signed)
 GERD in Adults: What to Know  Gastroesophageal reflux (GER) is when acid from your stomach flows up into your esophagus. Your esophagus is the part of your body that moves food from your mouth to your stomach. Normally, food goes down and stays in your stomach to be digested. But with GER, food and stomach acid may go back up. You may have a disease called gastroesophageal reflux disease (GERD) if the reflux: Happens often. Causes very bad symptoms. Makes your esophagus sore and swollen. Over time, GERD can make small holes called ulcers in the lining of your esophagus. What are the causes? GERD is caused by a problem with the muscle between your esophagus and stomach. This muscle is called the lower esophageal sphincter (LES). When it's weak or not normal, it doesn't close like it should. This means food and stomach acid can go back up into your esophagus. The muscle can be weak if: You smoke or use products with tobacco in them. You're pregnant. You have a type of hernia called a hiatal hernia. You eat certain foods and drinks. These include: Alcohol. Coffee. Chocolate. Onions. Peppermint. What increases the risk? Being overweight. Having a disease that affects your connective tissue. Taking NSAIDs, such as ibuprofen. What are the signs or symptoms? Heartburn. Trouble swallowing. Pain when you swallow. The feeling of having a lump in your throat. A bitter taste in your mouth. Bad breath. Having an upset or bloated stomach. Burping. Chest pain. Other conditions can also cause chest pain. Make sure you see your health care provider if you have chest pain. Wheezing. This is when you make high-pitched whistling sounds when you breathe, most often when you breathe out. A long-term cough or a cough at night. How is this diagnosed? GERD may be diagnosed based on your medical history and a physical exam. You may also have tests. These may include: An endoscopy. This test looks at your  stomach and esophagus with a small camera. A barium swallow test. This shows the shape and size of your esophagus and how well it's working. Tests of your esophagus to check for: Acid levels. Pressure. How is this treated? Treatment may depend on how bad your symptoms are. It may include: Changes to your diet and daily life. Medicines. Surgery. Follow these instructions at home: Eating and drinking Follow an eating plan as told by your provider. You may need to avoid certain foods and drinks. These may include: Coffee and tea, with or without caffeine. Alcohol. Energy drinks and sports drinks. Fizzy drinks or sodas. Chocolate and cocoa. Peppermint and mint flavorings. Garlic and onions. Horseradish. Spicy and acidic foods. These include: Peppers. Chili powder and curry powder. Vinegar. Hot sauces and BBQ sauce. Citrus fruits and juices. These include: Oranges. Lemons. Limes. Tomato-based foods. These include: Red sauce and pizza with red sauce. Chili. Salsa. Fried and fatty foods. These include: Donuts. [place] fries. Potato chips. High-fat dressings. High-fat meats. These include: Hot dogs and sausage. Rib eye steak. Ham and bacon. High-fat dairy items. These include: Whole milk. Butter. Cream cheese. Eat small meals often. Avoid eating big meals. Avoid drinking lots of liquid with your meals. Try not to eat meals during the 2-3 hours before bedtime. Try not to lie down right after you eat. Do not exercise right after you eat. Lifestyle  If you're overweight, lose an amount of weight that's healthy for you. Ask your provider about a safe weight loss goal. Do not smoke, vape, or use nicotine or tobacco. Wear  loose clothes. Do not wear things that are tight around your waist. When you sleep, try: Raising the head of your bed about 6 inches (15 cm). You can use a wedge to do this. Lying down on your left side. Try to lower your stress. If you need help doing  this, ask your provider. General instructions Take your medicines only as told. Do not take aspirin or ibuprofen unless you're told to. Watch for any changes in your symptoms. Do not bend over if it makes your symptoms worse. Contact a health care provider if: You have new symptoms. You have trouble: Drinking. Swallowing. Eating. It hurts to swallow. You have wheezing. You have a cough that won't go away. Your voice is hoarse. Your symptoms don't get better with treatment. Get help right away if: You have pain all of a sudden in your: Arm. Neck. Jaw. Teeth. Back. You feel sweaty, dizzy, or light-headed all of a sudden. You faint. You have chest pain or shortness of breath. You vomit and the vomit is: Green, yellow, or black. Looks like blood or coffee grounds. Your poop is red, bloody, or black. These symptoms may be an emergency. Call 911 right away. Do not wait to see if the symptoms will go away. Do not drive yourself to the hospital. This information is not intended to replace advice given to you by your health care provider. Make sure you discuss any questions you have with your health care provider. Document Revised: 06/30/2023 Document Reviewed: 01/14/2023 Elsevier Patient Education  2024 [employer].

## 2024-04-08 ENCOUNTER — Ambulatory Visit: Payer: Self-pay | Admitting: Internal Medicine

## 2024-04-08 LAB — BASIC METABOLIC PANEL WITH GFR
BUN: 25 mg/dL — ABNORMAL HIGH (ref 6–23)
CO2: 27 meq/L (ref 19–32)
Calcium: 9.7 mg/dL (ref 8.4–10.5)
Chloride: 102 meq/L (ref 96–112)
Creatinine, Ser: 0.99 mg/dL (ref 0.40–1.20)
GFR: 50.62 mL/min — ABNORMAL LOW (ref >60.00)
Glucose, Bld: 94 mg/dL (ref 70–99)
Potassium: 4 meq/L (ref 3.5–5.1)
Sodium: 141 meq/L (ref 135–145)

## 2024-04-08 LAB — URIC ACID: Uric Acid, Serum: 6.2 mg/dL (ref 2.4–7.0)

## 2024-04-27 ENCOUNTER — Encounter: Payer: Self-pay | Admitting: Physician Assistant

## 2024-04-27 ENCOUNTER — Ambulatory Visit: Admitting: Physician Assistant

## 2024-04-27 VITALS — BP 130/78 | HR 57 | Ht 66.0 in | Wt 190.0 lb

## 2024-04-27 DIAGNOSIS — R1319 Other dysphagia: Secondary | ICD-10-CM

## 2024-04-27 DIAGNOSIS — K219 Gastro-esophageal reflux disease without esophagitis: Secondary | ICD-10-CM

## 2024-04-27 DIAGNOSIS — R1310 Dysphagia, unspecified: Secondary | ICD-10-CM

## 2024-04-27 NOTE — Progress Notes (Signed)
 Chief Complaint: Follow-up dysphagia  HPI:    [name] is an 89 year old female with a past medical history as listed below including A-fib on Xarelto  (11/27/2023 echo with LVEF 60-65%, mild aortic valve sclerosis without stenosis), colon cancer, IBS and multiple others, known to Dr. Shila, who returns to clinic today for complaint of dysphagia.    10/31/2022 patient seen in clinic by Dr. Shila and at that time discussed chronic reflux, hernia, esophageal stricture status post dilation, sigmoid cancer status post sigmoid resection and anastomotic stricture.  At that time complaining of IBS, more regular with adjusting fiber and MiraLAX .  Overall doing well.  At that time told to continue Benefiber 1 tablespoon 1-3 times a day and MiraLAX  half to 1 capful as well as IBgard 1 capsule up to 3 times a day as needed.  Continued on famotidine  for reflux and FDgard 1 capsule up to 3 times a day for dyspepsia.    09/22/2023 CBC with eosinophils of 7.1 and otherwise normal, hepatic function panel normal, BMP normal.   4 /7/25 patient seen in clinic accompanied by her daughter and discussed dysphagia again, last time she had an EGD in 2015 she had a stricture which was dilated but they did not want to repeat due to age.  Omeprazole  previously caused nausea and abdominal pain.  She was on Famotidine .  At that visit recommended a barium esophagram with tablet and pending that consider EGD.  Stop Famotidine  start Pantoprazole  20 mg daily.    12/29/2023 esophagram with tiny sliding hiatal hernia and small diverticulum arising from its left lateral wall, mild Schatzki's ring at the GE junction however it did not impede passage of a 13 mm barium tablet and mild esophageal dysmotility.    Today, the patient tells me that she has been doing okay, she has been using the chin tuck technique and feels like this really helps when she is eating her food.  In fact this helps so much that she does not want any further workup  at this point.  She did try the Pantoprazole  20 mg daily but this caused tachycardia per her so she stopped it.  Currently using the Famotidine  20 mg twice daily which she tolerates.  She is not interested in any further workup.    Denies fever, chills or weight loss.  GI Hx; Echocardiogram 11/27/2023 LVEF 60-65% with mild grade 1 relaxation impairment mild mitral regurg and mild aortic valve sclerosis without stenosis.   Echocardiogram 05/21/2022 1 Left ventricular ejection fraction, by estimation, is 60 to 65%. The left ventricle has normal function. The left ventricle has no regional wall motion abnormalities. There is mild left ventricular hypertrophy. Left ventricular diastolic parameters were normal.  2. Right ventricular systolic function is normal. The right ventricular size is normal. There is normal pulmonary artery systolic pressure.  3. The mitral valve is normal in structure. Mild mitral valve regurgitation. No evidence of mitral stenosis.  4. The aortic valve is normal in structure. Aortic valve regurgitation is trivial. Aortic valve sclerosis is present, with no evidence of aortic valve stenosis.  5. The inferior vena cava is normal in size with greater than 50% respiratory variability, suggesting right atrial pressure of 3 mmHg.    Cardiac Catheterization  Mid Cx lesion, 10% stenosed. The left ventricular systolic function is normal.   Normal LV function with an estimated ejection fraction of 55-60%.   No significant coronary obstructive disease with the midportion of the mid LAD dipping intramyocardially without  evidence for systolic bridging, smooth 10% narrowing in the AV groove circumflex  coronary artery, and normal dominant RCA.     EGD July 19, 2014  distal esophageal stricture dilated from 14 to 18 mm prior to that EGD April 2014   Colonoscopy October 2012  showed minimal narrowing at sigmoid anastomosis at 20 cm.  No recall due to age.    Past Medical History:   Diagnosis Date   Abnormal nuclear stress test    mild anterolateral septal and inferior ischemia   Arthritis    Atherosclerosis of lower extremity with claudication (HCC) 04/17/2015   LEFT LEG   Atrial fibrillation (HCC)    Cardiac arrhythmia due to congenital heart disease    Claudication [facility])    Colon cancer (HCC)    Coronary atherosclerosis of unspecified type of vessel, native or graft    Esophageal stricture    Fatty liver 11/05/2011   Hiatal hernia    HLD (hyperlipidemia)    Hypertension    IBS (irritable bowel syndrome)    Iron deficiency anemia, unspecified    Ischemic colitis (HCC)    Neuropathy    Osteoporosis    PAF (paroxysmal atrial fibrillation) (HCC) 04/2015   Ulcerative colitis (HCC)    Unspecified vascular insufficiency of intestine     Past Surgical History:  Procedure Laterality Date   abdominoperineal resection anastomotic stricture  05/03/2007   APPENDECTOMY     CARDIAC CATHETERIZATION N/A 03/13/2015   Procedure: Left Heart Cath and Coronary Angiography;  Surgeon: Debby DELENA Sor, MD;  Location: [facility] INVASIVE CV LAB;  Service: Cardiovascular;  Laterality: N/A;   CARDIOVERSION N/A 04/20/2015   Procedure: CARDIOVERSION;  Surgeon: Oneil JAYSON Parchment, MD;  Location: [facility] ENDOSCOPY;  Service: Cardiovascular;  Laterality: N/A;   CARDIOVERSION N/A 07/10/2017   Procedure: CARDIOVERSION;  Surgeon: Parchment Oneil JAYSON, MD;  Location: [facility] ENDOSCOPY;  Service: Cardiovascular;  Laterality: N/A;   COLON SURGERY     DILATION AND CURETTAGE OF UTERUS  09/01/1990   ESOPHAGOGASTRODUODENOSCOPY N/A 04/27/2014   Procedure: ESOPHAGOGASTRODUODENOSCOPY (EGD);  Surgeon: Princella CHRISTELLA Nida, MD;  Location: [facility];  Service: Endoscopy;  Laterality: N/A;   Mandibular Renstruction     ROTATOR CUFF REPAIR  09/01/2004   left   Rt. Salpingo oophorectomy and cyst removal  09/02/2003   SAVORY DILATION N/A 04/27/2014   Procedure: SAVORY DILATION;  Surgeon: Princella CHRISTELLA Nida, MD;  Location: [facility] ENDOSCOPY;   Service: Endoscopy;  Laterality: N/A;  no xray needed   sigmoid resection for invasive rectal adenocarcinoma  12/31/2006   TEE WITHOUT CARDIOVERSION N/A 04/20/2015   Procedure: TRANSESOPHAGEAL ECHOCARDIOGRAM (TEE);  Surgeon: Oneil JAYSON Parchment, MD;  Location: [facility] ENDOSCOPY;  Service: Cardiovascular;  Laterality: N/A;   TUBAL LIGATION     WRIST SURGERY  09/01/2006   right    Current Outpatient Medications  Medication Sig Dispense Refill   acetaminophen  (TYLENOL ) 500 MG tablet Take 1,000 mg by mouth every 6 (six) hours as needed for headache (pain).     allopurinol  (ZYLOPRIM ) 100 MG tablet TAKE 1 TABLET EVERY DAY 90 tablet 3   Calcium  Carbonate-Vitamin D  (CALCIUM -D PO) Take 1 tablet by mouth at bedtime. Calcium  630 mg, Vitamin D3 500 units     colchicine  0.6 MG tablet TAKE 1 TABLET TWICE DAILY 180 tablet 3   cyanocobalamin  2000 MCG tablet Take 1 tablet (2,000 mcg total) by mouth every other day. (Patient taking differently: Take 2,000 mcg by mouth daily.) 45 tablet 1   diclofenac  sodium (VOLTAREN )  1 % GEL Apply 2 g topically 4 (four) times daily. 100 g 1   Dietary Management Product (FOSTEUM PLUS) CAPS Take 1 capsule by mouth 2 (two) times daily. 180 capsule 2   ezetimibe  (ZETIA ) 10 MG tablet Take 1 tablet (10 mg total) by mouth daily. 90 tablet 3   famotidine  (PEPCID ) 20 MG tablet Take 1 tablet (20 mg total) by mouth 2 (two) times daily. 90 tablet 1   finasteride  (PROPECIA ) 1 MG tablet TAKE 1 TABLET AT BEDTIME 90 tablet 3   gabapentin  (NEURONTIN ) 100 MG capsule Take 1 capsule (100 mg total) by mouth 3 (three) times daily. (Patient taking differently: Take 100 mg by mouth as needed. At night time) 270 capsule 1   metoprolol  tartrate (LOPRESSOR ) 25 MG tablet TAKE 1 AND 1/2 TABLETS IN THE MORNING  AND TAKE 1 TABLET AT BEDTIME 225 tablet 3   Peppermint Oil (IBGARD) 90 MG CPCR 1 capsule three times daily as needed     polyethylene glycol powder (MIRALAX ) 17 GM/SCOOP powder 1/2 capful daily. 255 g 0    potassium chloride  (KLOR-CON  M) 10 MEQ tablet TAKE 2 TABLETS EVERY DAY (DOSE INCREASED DUE TO HYPOKALEMIA) 180 tablet 3   sotalol  (BETAPACE ) 120 MG tablet Take 1 tablet (120 mg total) by mouth every 12 (twelve) hours. 180 tablet 3   torsemide  (DEMADEX ) 10 MG tablet TAKE 1 TABLET BY MOUTH TWICE DAILY 180 tablet 0   traMADol  (ULTRAM ) 50 MG tablet Take 1 tablet (50 mg total) by mouth every 6 (six) hours as needed. 90 tablet 5   Wheat Dextrin (BENEFIBER) POWD 1 tablespoon 2 to 3 times daily with meals (Patient taking differently: Take by mouth daily in the afternoon. 1 tablespoon 2 to 3 times daily with meals) 30 g 0   XARELTO  15 MG TABS tablet TAKE 1 TABLET EVERY DAY WITH SUPPER 90 tablet 3   No current facility-administered medications for this visit.    Allergies as of 04/27/2024 - Review Complete 04/07/2024  Allergen Reaction Noted   Crestor  [rosuvastatin  calcium ] Other (See Comments) 12/25/2016   Acetaminophen -codeine  Other (See Comments)    Amlodipine besylate Swelling 11/09/2008   Hydrocodone -acetaminophen  Nausea And Vomiting    Irbesartan-hydrochlorothiazide  Other (See Comments) 11/09/2008   Lisinopril Cough    Omeprazole  Nausea Only and Other (See Comments) 11/15/2014   Propoxyphene n-acetaminophen  Other (See Comments)    Valsartan Other (See Comments) 11/09/2008   Warfarin sodium Other (See Comments) 11/09/2008   Diltiazem hcl Rash 11/09/2008   Verapamil  Nausea Only, Palpitations, and Other (See Comments) 06/02/2011    Family History  Problem Relation Age of Onset   Colon cancer Father 38   Heart disease Father    Hypertension Father    Alzheimer's disease Mother    Thyroid  disease Mother    Hypertension Sister    Thyroid  disease Sister    Mitral valve prolapse Sister    Hypertension Son    Diabetes Son    Hypertension Son    Diabetes Son    Esophageal cancer Neg Hx    Rectal cancer Neg Hx    Stomach cancer Neg Hx     Social History   Socioeconomic History    Marital status: Widowed    Spouse name: Not on file   Number of children: 4   Years of education: 12th grade   Highest education level: Not on file  Occupational History   Occupation: retired    Associate Professor: RETIRED  Tobacco Use   Smoking status:  Never    Passive exposure: Never   Smokeless tobacco: Never  Vaping Use   Vaping status: Never Used  Substance and Sexual Activity   Alcohol use: No   Drug use: No   Sexual activity: Not Currently    Partners: Male    Comment: widowed  Other Topics Concern   Not on file  Social History Narrative   Lives at home with fiance.   Right-handed.   No caffeine use.   Social Drivers of Corporate investment banker Strain: Low Risk  (11/13/2021)   Overall Financial Resource Strain (CARDIA)    Difficulty of Paying Living Expenses: Not hard at all  Food Insecurity: No Food Insecurity (11/13/2021)   Hunger Vital Sign    Worried About Running Out of Food in the Last Year: Never true    Ran Out of Food in the Last Year: Never true  Transportation Needs: No Transportation Needs (11/13/2021)   PRAPARE - Administrator, Civil Service (Medical): No    Lack of Transportation (Non-Medical): No  Physical Activity: Inactive (11/13/2021)   Exercise Vital Sign    Days of Exercise per Week: 0 days    Minutes of Exercise per Session: 0 min  Stress: No Stress Concern Present (11/13/2021)   [employer] of Occupational Health - Occupational Stress Questionnaire    Feeling of Stress : Not at all  Social Connections: Moderately Integrated (11/13/2021)   Social Connection and Isolation Panel    Frequency of Communication with Friends and Family: More than three times a week    Frequency of Social Gatherings with Friends and Family: More than three times a week    Attends Religious Services: More than 4 times per year    Active Member of [employer] or Organizations: Yes    Attends Banker Meetings: More than 4 times per year    Marital  Status: Widowed  Intimate Partner Violence: Not At Risk (11/13/2021)   Humiliation, Afraid, Rape, and Kick questionnaire    Fear of Current or Ex-Partner: No    Emotionally Abused: No    Physically Abused: No    Sexually Abused: No    Review of Systems:    Constitutional: No weight loss, fever or chills Cardiovascular: No chest pain Respiratory: No SOB  Gastrointestinal: See HPI and otherwise negative   Physical Exam:  Vital signs: BP 130/78   Pulse (!) 57   Ht 5' 6 (1.676 m)   Wt 190 lb (86.2 kg)   BMI 30.67 kg/m    Constitutional:   Pleasant elderly Caucasian female appears to be in NAD, Well developed, Well nourished, alert and cooperative Respiratory: Respirations even and unlabored. Lungs clear to auscultation bilaterally.   No wheezes, crackles, or rhonchi.  Cardiovascular: Normal S1, S2. No MRG. Regular rate and rhythm. No peripheral edema, cyanosis or pallor.  Gastrointestinal:  Soft, nondistended, nontender. No rebound or guarding. Normal bowel sounds. No appreciable masses or hepatomegaly. Rectal:  Not performed.  Psychiatric: Oriented to person, place and time. Demonstrates good judgement and reason without abnormal affect or behaviors.  RELEVANT LABS AND IMAGING: CBC    Component Value Date/Time   WBC 4.7 04/07/2024 1358   RBC 4.87 04/07/2024 1358   HGB 14.2 04/07/2024 1358   HGB 13.4 09/14/2019 0944   HGB 15.0 04/09/2017 1345   HCT 43.1 04/07/2024 1358   HCT 41.9 09/14/2019 0944   HCT 45.1 04/09/2017 1345   PLT 197.0 04/07/2024 1358   PLT  193 09/14/2019 0944   MCV 88.5 04/07/2024 1358   MCV 88 09/14/2019 0944   MCV 92.0 04/09/2017 1345   MCH 31.3 05/02/2022 1050   MCHC 33.0 04/07/2024 1358   RDW 14.8 04/07/2024 1358   RDW 13.6 09/14/2019 0944   RDW 13.7 04/09/2017 1345   LYMPHSABS 1.5 04/07/2024 1358   LYMPHSABS 1.9 04/09/2017 1345   MONOABS 0.5 04/07/2024 1358   MONOABS 0.5 04/09/2017 1345   EOSABS 0.3 04/07/2024 1358   EOSABS 0.2 04/09/2017  1345   EOSABS 0.3 03/18/2012 1010   BASOSABS 0.0 04/07/2024 1358   BASOSABS 0.0 04/09/2017 1345    CMP     Component Value Date/Time   NA 141 04/07/2024 1358   NA 139 09/14/2019 0944   NA 143 04/09/2017 1345   K 4.0 04/07/2024 1358   K 3.5 04/09/2017 1345   CL 102 04/07/2024 1358   CL 108 09/26/2009 1013   CO2 27 04/07/2024 1358   CO2 31 (H) 04/09/2017 1345   GLUCOSE 94 04/07/2024 1358   GLUCOSE 93 04/09/2017 1345   GLUCOSE 103 09/26/2009 1013   BUN 25 (H) 04/07/2024 1358   BUN 19 09/14/2019 0944   BUN 16.5 04/09/2017 1345   CREATININE 0.99 04/07/2024 1358   CREATININE 0.8 04/09/2017 1345   CALCIUM  9.7 04/07/2024 1358   CALCIUM  9.9 04/09/2017 1345   PROT 7.5 09/22/2023 1215   PROT 6.8 09/14/2019 0944   PROT 7.2 04/09/2017 1345   ALBUMIN 4.4 09/22/2023 1215   ALBUMIN 4.4 09/14/2019 0944   ALBUMIN 3.9 04/09/2017 1345   AST 18 09/22/2023 1215   AST 26 04/09/2017 1345   ALT 13 09/22/2023 1215   ALT 21 04/09/2017 1345   ALKPHOS 72 09/22/2023 1215   ALKPHOS 57 04/09/2017 1345   BILITOT 0.9 09/22/2023 1215   BILITOT 0.9 09/14/2019 0944   BILITOT 1.21 (H) 04/09/2017 1345   GFRNONAA 59 (L) 05/02/2022 1050   GFRAA 71 09/14/2019 0944    Assessment: 1.  Dysphagia: Patient doing better with a chin tuck technique, recent barium esophagram showed some mild esophageal dysmotility, mild Schatzki's ring, esophageal diverticulum and a hiatal hernia, all of which are contributing, patient does not wish for further workup at this point 2.  GERD: Some better with Famotidine  20 mg twice daily, cannot tolerate PPIs given multiple side effects including tachycardia/palpitations, last EGD in 2015 per the patient for stretching of a stricture, symptoms are stable  Plan: 1.  Continue Famotidine  20 mg twice daily 2.  Reviewed anti-dysphagia measures. 3.  Discussed with patient that next up in her workup would be an EGD for dilation of the mild Schatzki's ring seen at time of barium swallow.   She is not interested right now as she seems to be doing okay but will call if her symptoms increase or worsen.  Did discuss the possibility that she ends up in the ER for a food impaction.  She verbalized understanding but does not want an EGD. 4.  Patient to follow clinic with us  as needed.  Delon Failing, PA-C [facility] 04/27/2024, 2:53 PM  Cc: Joshua Debby CROME, MD

## 2024-04-29 DIAGNOSIS — M1711 Unilateral primary osteoarthritis, right knee: Secondary | ICD-10-CM | POA: Diagnosis not present

## 2024-05-06 DIAGNOSIS — M1711 Unilateral primary osteoarthritis, right knee: Secondary | ICD-10-CM | POA: Diagnosis not present

## 2024-05-13 DIAGNOSIS — M1711 Unilateral primary osteoarthritis, right knee: Secondary | ICD-10-CM | POA: Diagnosis not present

## 2024-05-23 DIAGNOSIS — Z0100 Encounter for examination of eyes and vision without abnormal findings: Secondary | ICD-10-CM | POA: Diagnosis not present

## 2024-05-23 DIAGNOSIS — H524 Presbyopia: Secondary | ICD-10-CM | POA: Diagnosis not present

## 2024-06-05 NOTE — Progress Notes (Unsigned)
 Cardiology Office Note:    Date:  06/05/2024   ID:  [name], DOB [birth year], MRN [id number]  PCP:  Joshua Debby CROME, MD  Cardiologist:  Debby Sor, MD (Inactive)  Electrophysiologist:  None   Referring MD: Joshua Debby CROME, MD   No chief complaint on file. ***  History of Present Illness:    [name] is a 89 y.o. female with a hx of paroxysmal atrial fibrillation, hypertension, hyperlipidemia, colon cancer, ulcerative colitis presents for follow-up.  Previously followed with Dr. Sor.  Has required multiple cardioversions.  Currently on sotalol  120 mg twice daily.  Echocardiogram 10/2023 showed EF 60 to 65%, normal RV function, mild mitral egurgitation.  Cardiac catheterization in 2016 showed no significant CAD  Past Medical History:  Diagnosis Date   Abnormal nuclear stress test    mild anterolateral septal and inferior ischemia   Arthritis    Atherosclerosis of lower extremity with claudication 04/17/2015   LEFT LEG   Atrial fibrillation (HCC)    Cardiac arrhythmia due to congenital heart disease    Claudication    Colon cancer [facility])    Coronary atherosclerosis of unspecified type of vessel, native or graft    Esophageal stricture    Fatty liver 11/05/2011   Hiatal hernia    HLD (hyperlipidemia)    Hypertension    IBS (irritable bowel syndrome)    Iron deficiency anemia, unspecified    Ischemic colitis    Neuropathy    Osteoporosis    PAF (paroxysmal atrial fibrillation) (HCC) 04/2015   Ulcerative colitis (HCC)    Unspecified vascular insufficiency of intestine     Past Surgical History:  Procedure Laterality Date   abdominoperineal resection anastomotic stricture  05/03/2007   APPENDECTOMY     CARDIAC CATHETERIZATION N/A 03/13/2015   Procedure: Left Heart Cath and Coronary Angiography;  Surgeon: Debby DELENA Sor, MD;  Location: [facility] INVASIVE CV LAB;  Service: Cardiovascular;  Laterality: N/A;   CARDIOVERSION N/A 04/20/2015   Procedure:  CARDIOVERSION;  Surgeon: Oneil JAYSON Parchment, MD;  Location: [facility] ENDOSCOPY;  Service: Cardiovascular;  Laterality: N/A;   CARDIOVERSION N/A 07/10/2017   Procedure: CARDIOVERSION;  Surgeon: Parchment Oneil JAYSON, MD;  Location: [facility] ENDOSCOPY;  Service: Cardiovascular;  Laterality: N/A;   COLON SURGERY     DILATION AND CURETTAGE OF UTERUS  09/01/1990   ESOPHAGOGASTRODUODENOSCOPY N/A 04/27/2014   Procedure: ESOPHAGOGASTRODUODENOSCOPY (EGD);  Surgeon: Princella CHRISTELLA Nida, MD;  Location: [facility];  Service: Endoscopy;  Laterality: N/A;   Mandibular Renstruction     ROTATOR CUFF REPAIR  09/01/2004   left   Rt. Salpingo oophorectomy and cyst removal  09/02/2003   SAVORY DILATION N/A 04/27/2014   Procedure: SAVORY DILATION;  Surgeon: Princella CHRISTELLA Nida, MD;  Location: [facility] ENDOSCOPY;  Service: Endoscopy;  Laterality: N/A;  no xray needed   sigmoid resection for invasive rectal adenocarcinoma  12/31/2006   TEE WITHOUT CARDIOVERSION N/A 04/20/2015   Procedure: TRANSESOPHAGEAL ECHOCARDIOGRAM (TEE);  Surgeon: Oneil JAYSON Parchment, MD;  Location: [facility] ENDOSCOPY;  Service: Cardiovascular;  Laterality: N/A;   TUBAL LIGATION     WRIST SURGERY  09/01/2006   right    Current Medications: No outpatient medications have been marked as taking for the 06/10/24 encounter (Appointment) with Kate Lonni CROME, MD.     Allergies:   Crestor  [rosuvastatin  calcium ], Acetaminophen -codeine , Amlodipine besylate, Hydrocodone -acetaminophen , Irbesartan-hydrochlorothiazide , Lisinopril, Omeprazole , Propoxyphene n-acetaminophen , Valsartan, Warfarin sodium, Diltiazem hcl, and Verapamil    Social History   Socioeconomic History   Marital status: Widowed  Spouse name: Not on file   Number of children: 4   Years of education: 12th grade   Highest education level: Not on file  Occupational History   Occupation: retired    Associate Professor: RETIRED  Tobacco Use   Smoking status: Never    Passive exposure: Never   Smokeless tobacco: Never  Vaping Use    Vaping status: Never Used  Substance and Sexual Activity   Alcohol use: No   Drug use: No   Sexual activity: Not Currently    Partners: Male    Comment: widowed  Other Topics Concern   Not on file  Social History Narrative   Lives at home with fiance.   Right-handed.   No caffeine use.   Social Drivers of Corporate investment banker Strain: Low Risk  (11/13/2021)   Overall Financial Resource Strain (CARDIA)    Difficulty of Paying Living Expenses: Not hard at all  Food Insecurity: No Food Insecurity (11/13/2021)   Hunger Vital Sign    Worried About Running Out of Food in the Last Year: Never true    Ran Out of Food in the Last Year: Never true  Transportation Needs: No Transportation Needs (11/13/2021)   PRAPARE - Administrator, Civil Service (Medical): No    Lack of Transportation (Non-Medical): No  Physical Activity: Inactive (11/13/2021)   Exercise Vital Sign    Days of Exercise per Week: 0 days    Minutes of Exercise per Session: 0 min  Stress: No Stress Concern Present (11/13/2021)   [employer] of Occupational Health - Occupational Stress Questionnaire    Feeling of Stress : Not at all  Social Connections: Moderately Integrated (11/13/2021)   Social Connection and Isolation Panel    Frequency of Communication with Friends and Family: More than three times a week    Frequency of Social Gatherings with Friends and Family: More than three times a week    Attends Religious Services: More than 4 times per year    Active Member of [employer] or Organizations: Yes    Attends Banker Meetings: More than 4 times per year    Marital Status: Widowed     Family History: The patient's ***family history includes Alzheimer's disease in her mother; Colon cancer (age of onset: 13) in her father; Diabetes in her son and son; Heart disease in her father; Hypertension in her father, sister, son, and son; Mitral valve prolapse in her sister; Thyroid  disease in her  mother and sister. There is no history of Esophageal cancer, Rectal cancer, or Stomach cancer.  ROS:   Please see the history of present illness.    *** All other systems reviewed and are negative.  EKGs/Labs/Other Studies Reviewed:    The following studies were reviewed today: ***  EKG:  EKG is *** ordered today.  The ekg ordered today demonstrates ***  Recent Labs: 09/22/2023: ALT 13 04/07/2024: BUN 25; Creatinine, Ser 0.99; Hemoglobin 14.2; Platelets 197.0; Potassium 4.0; Sodium 141; TSH 1.41  Recent Lipid Panel    Component Value Date/Time   CHOL 232 (H) 09/22/2023 1215   CHOL 184 09/14/2019 0944   TRIG 210.0 (H) 09/22/2023 1215   HDL 50.50 09/22/2023 1215   HDL 54 09/14/2019 0944   CHOLHDL 5 09/22/2023 1215   VLDL 42.0 (H) 09/22/2023 1215   LDLCALC 140 (H) 09/22/2023 1215   LDLCALC 99 09/14/2019 0944   LDLDIRECT 150.0 12/28/2017 1340    Physical Exam:    VS:  There were no vitals taken for this visit.    Wt Readings from Last 3 Encounters:  04/27/24 190 lb (86.2 kg)  04/07/24 187 lb (84.8 kg)  12/07/23 189 lb 6 oz (85.9 kg)     GEN: *** Well nourished, well developed in no acute distress HEENT: Normal NECK: No JVD; No carotid bruits LYMPHATICS: No lymphadenopathy CARDIAC: ***RRR, no murmurs, rubs, gallops RESPIRATORY:  Clear to auscultation without rales, wheezing or rhonchi  ABDOMEN: Soft, non-tender, non-distended MUSCULOSKELETAL:  No edema; No deformity  SKIN: Warm and dry NEUROLOGIC:  Alert and oriented x 3 PSYCHIATRIC:  Normal affect   ASSESSMENT:    No diagnosis found. PLAN:    Paroxysmal atrial fibrillation: Has required multiple cardioversions.  Currently on sotalol  120 mg twice daily.  Echocardiogram 10/2023 showed EF 60 to 65%, normal RV function, mild mitral egurgitation. - Continue Xarelto  - Continue sotalol  120 mg twice daily --Continue metoprolol  12.5 mg twice daily  Chronic diastolic heart failure: On torsemide   Hyperlipidemia: On Zetia   10 mg daily  RTC in***   Medication Adjustments/Labs and Tests Ordered: Current medicines are reviewed at length with the patient today.  Concerns regarding medicines are outlined above.  No orders of the defined types were placed in this encounter.  No orders of the defined types were placed in this encounter.   There are no Patient Instructions on file for this visit.   Signed, Lonni LITTIE Nanas, MD  06/05/2024 3:56 PM     [facility]

## 2024-06-10 ENCOUNTER — Other Ambulatory Visit: Payer: Self-pay | Admitting: Internal Medicine

## 2024-06-10 ENCOUNTER — Ambulatory Visit: Attending: Cardiology | Admitting: Cardiology

## 2024-06-10 ENCOUNTER — Other Ambulatory Visit: Payer: Self-pay

## 2024-06-10 ENCOUNTER — Encounter: Payer: Self-pay | Admitting: Cardiology

## 2024-06-10 VITALS — BP 152/74 | HR 53 | Ht 66.0 in | Wt 190.6 lb

## 2024-06-10 DIAGNOSIS — I5032 Chronic diastolic (congestive) heart failure: Secondary | ICD-10-CM

## 2024-06-10 DIAGNOSIS — E782 Mixed hyperlipidemia: Secondary | ICD-10-CM | POA: Diagnosis not present

## 2024-06-10 DIAGNOSIS — I480 Paroxysmal atrial fibrillation: Secondary | ICD-10-CM | POA: Diagnosis not present

## 2024-06-10 DIAGNOSIS — I10 Essential (primary) hypertension: Secondary | ICD-10-CM

## 2024-06-10 MED ORDER — METOPROLOL TARTRATE 25 MG PO TABS: 25.0000 mg | ORAL_TABLET | Freq: Two times a day (BID) | ORAL | 3 refills | Status: AC

## 2024-06-10 MED ORDER — TORSEMIDE 10 MG PO TABS: 10.0000 mg | ORAL_TABLET | Freq: Every day | ORAL | 0 refills | Status: AC

## 2024-06-10 NOTE — Patient Instructions (Signed)
 Medication Instructions:  Start METOPROLOL  25MG  TWICE A DAY Your physician recommends that you continue on your current medications as directed. Please refer to the Current Medication list given to you today.  *If you need a refill on your cardiac medications before your next appointment, please call your pharmacy*  Lab Work: Bmet, mg, cbc today If you have labs (blood work) drawn today and your tests are completely normal, you will receive your results only by: MyChart Message (if you have MyChart) OR A paper copy in the mail If you have any lab test that is abnormal or we need to change your treatment, we will call you to review the results.  Testing/Procedures: none  Follow-Up: At [facility], you and your health needs are our priority.  As part of our continuing mission to provide you with exceptional heart care, our providers are all part of one team.  This team includes your primary Cardiologist (physician) and Advanced Practice Providers or APPs (Physician Assistants and Nurse Practitioners) who all work together to provide you with the care you need, when you need it.  Your next appointment:   4 months  Provider:   Dr. Kate  We recommend signing up for the patient portal called MyChart.  Sign up information is provided on this After Visit Summary.  MyChart is used to connect with patients for Virtual Visits (Telemedicine).  Patients are able to view lab/test results, encounter notes, upcoming appointments, etc.  Non-urgent messages can be sent to your provider as well.   To learn more about what you can do with MyChart, go to [email or web address].   Other Instructions none

## 2024-06-11 LAB — BASIC METABOLIC PANEL WITH GFR
BUN/Creatinine Ratio: 25 (ref 12–28)
BUN: 25 mg/dL (ref 8–27)
CO2: 26 mmol/L (ref 20–29)
Calcium: 10.3 mg/dL (ref 8.7–10.3)
Chloride: 104 mmol/L (ref 96–106)
Creatinine, Ser: 1.02 mg/dL — ABNORMAL HIGH (ref 0.57–1.00)
Glucose: 91 mg/dL (ref 70–99)
Potassium: 4.3 mmol/L (ref 3.5–5.2)
Sodium: 145 mmol/L — ABNORMAL HIGH (ref 134–144)
eGFR: 53 mL/min/1.73 — ABNORMAL LOW (ref >59)

## 2024-06-11 LAB — CBC
Hematocrit: 44.9 % (ref 34.0–46.6)
Hemoglobin: 14.5 g/dL (ref 11.1–15.9)
MCH: 29.2 pg (ref 26.6–33.0)
MCHC: 32.3 g/dL (ref 31.5–35.7)
MCV: 91 fL (ref 79–97)
Platelets: 186 x10E3/uL (ref 150–450)
RBC: 4.96 x10E6/uL (ref 3.77–5.28)
RDW: 13.3 % (ref 11.7–15.4)
WBC: 5.6 x10E3/uL (ref 3.4–10.8)

## 2024-06-11 LAB — MAGNESIUM: Magnesium: 2.2 mg/dL (ref 1.6–2.3)

## 2024-06-12 ENCOUNTER — Ambulatory Visit: Payer: Self-pay | Admitting: Cardiology

## 2024-06-24 ENCOUNTER — Other Ambulatory Visit: Payer: Self-pay | Admitting: Internal Medicine

## 2024-06-24 DIAGNOSIS — K219 Gastro-esophageal reflux disease without esophagitis: Secondary | ICD-10-CM

## 2024-08-03 ENCOUNTER — Telehealth: Payer: Self-pay | Admitting: Internal Medicine

## 2024-08-03 DIAGNOSIS — M1711 Unilateral primary osteoarthritis, right knee: Secondary | ICD-10-CM | POA: Diagnosis not present

## 2024-08-03 NOTE — Telephone Encounter (Signed)
 Received via fax and placed in provider's box up front.

## 2024-08-04 ENCOUNTER — Telehealth (HOSPITAL_BASED_OUTPATIENT_CLINIC_OR_DEPARTMENT_OTHER): Payer: Self-pay

## 2024-08-04 NOTE — Telephone Encounter (Signed)
 Dr. Kate , patient's chart was reviewed for preoperative cardiac evaluation. She was seen by you on 06/10/24 and according to protocol, we request that you comment on cardiac risk for upcoming procedure since office visit was less than 2 months ago.    Please route your response to p cv div preop.  Message also routed to Pharm D for guidance on holding Xarelto .  Thank you, [name], NP-C 08/04/2024, 8:19 AM

## 2024-08-04 NOTE — Telephone Encounter (Signed)
No further cardiac workup recommended prior to surgery

## 2024-08-04 NOTE — Telephone Encounter (Signed)
   Pre-operative Risk Assessment    Patient Name: [name]  DOB: 1933/01/25 MRN: [id number]   Date of last office visit: 06/10/24 with Dr. Kate Date of next office visit: 10/24/24 with Kate  Request for Surgical Clearance    Procedure:  Right UNI Knee Arthroplasty   Date of Surgery:  Clearance TBD                                 Surgeon:  Dr. Josefina Surgeon's Group or Practice Name:  [facility] Phone number:  [phone] Fax number:  [phone]   Type of Clearance Requested:   - Medical  - Pharmacy:  Hold Rivaroxaban  (Xarelto ) not indicated   Type of Anesthesia:  Spinal   Additional requests/questions:    [name]   08/04/2024, 7:58 AM

## 2024-08-05 NOTE — Telephone Encounter (Signed)
 Patient with diagnosis of afib on Xarelto  for anticoagulation.    Procedure:  Right UNI Knee Arthroplasty  Date of procedure: TBD   CHA2DS2-VASc Score = 4   This indicates a 4.8% annual risk of stroke. The patient's score is based upon: CHF History: 0 HTN History: 1 Diabetes History: 0 Stroke History: 0 Vascular Disease History: 0 Age Score: 2 Gender Score: 1      CrCl 41 ml/min Platelet count 186  Patient has not had an Afib/aflutter ablation in the last 3 months, DCCV within the last 4 weeks or a watchman implanted in the last 45 days   Per office protocol, patient can hold Xarelto  for 3 days prior to procedure.    **This guidance is not considered finalized until pre-operative APP has relayed final recommendations.**

## 2024-08-05 NOTE — Telephone Encounter (Signed)
 Pre Op Clearance has been signed and faxed back.

## 2024-08-08 NOTE — Telephone Encounter (Signed)
   Primary Cardiologist: Debby Sor, MD (Inactive)  Chart reviewed as part of pre-operative protocol coverage. Given past medical history and time since last visit, based on ACC/AHA guidelines, [name] would be at acceptable risk for the planned procedure without further cardiovascular testing.   Patient should contact our office if she is having new symptoms that are concerning from a cardiac perspective to arrange a follow-up appointment.    Per office protocol, she may hold Xarelto  for 3 days prior to procedure and should resume as soon as hemodynamically stable postoperatively.  I will route this recommendation to the requesting party via Epic fax function and remove from pre-op pool.  Please call with questions.  [name], NP-C 08/08/2024, 7:06 AM [address], [address] [place], [place] [address] Office [phone] Fax [phone]

## 2024-08-29 ENCOUNTER — Other Ambulatory Visit: Payer: Self-pay

## 2024-08-30 MED ORDER — POTASSIUM CHLORIDE CRYS ER 10 MEQ PO TBCR: EXTENDED_RELEASE_TABLET | ORAL | 3 refills | Status: AC

## 2024-09-19 NOTE — Progress Notes (Signed)
 Anesthesia Review:  PCP: Sueellen Molt LVO 04/07/24  Cardiologist : DR Kate- LVO 06/10/2024   PPM/ ICD: Device Orders: Rep Notified:  Chest x-ray : EKG : 04/12/24  Echo : 11/27/23  Stress test: 2016  Cardiac Cath :  2016  Cardioversoin - 2023   Activity level:  Sleep Study/ CPAP : Fasting Blood Sugar :      / Checks Blood Sugar -- times a day:    Blood Thinner/ Instructions /Last Dose: ASA / Instructions/ Last Dose :    Xarelto 

## 2024-09-19 NOTE — Care Plan (Signed)
 Ortho Bundle Case Management Note  Patient Details  Name: [name] MRN: [id number] Date of Birth: 1932-10-17  met with patient and son in the office for H&P. will discharge to home with family to assist. rolling walker ordered. HHPT referral sent to [facility]. OPPT set up with [facility] [address] discharge instructions discussed and questions answered. Patient and MD in agreement with plan. Choice offered.                    DME Arranged:  Vannie rolling DME Agency:  [employer]  HH Arranged:  OT HH Agency:  [facility]  Additional Comments: Please contact me with any questions of if this plan should need to change.  Charlies Pitch,  RN,BSN,MHA,CCM  [facility] Orthopaedic Specialist  [phone] 09/19/2024, 1:58 PM

## 2024-09-19 NOTE — Patient Instructions (Signed)
 SURGICAL WAITING ROOM VISITATION  Patients having surgery or a procedure may have no more than 2 support people in the waiting area - these visitors may rotate.    Children ages 54 and under will not be able to visit patients in [facility] under most circumstances.   Visitors with respiratory illnesses are discouraged from visiting and should remain at home.  If the patient needs to stay at the hospital during part of their recovery, the visitor guidelines for inpatient rooms apply. Pre-op nurse will coordinate an appropriate time for 1 support person to accompany patient in pre-op.  This support person may not rotate.    Please refer to the [facility] website for the visitor guidelines for Inpatients (after your surgery is over and you are in a regular room).       Your procedure is scheduled on:  09/27/24    Report to [facility] Main Entrance    Report to admitting at  0630 AM   Call this number if you have problems the morning of surgery [phone]   Do not eat food :After Midnight.   After Midnight you may have the following liquids until __ 0600____ AM DAY OF SURGERY  Water Non-Citrus Juices (without pulp, NO RED-Apple, White grape, White cranberry) Black Coffee (NO MILK/CREAM OR CREAMERS, sugar ok)  Clear Tea (NO MILK/CREAM OR CREAMERS, sugar ok) regular and decaf                             Plain Jell-O (NO RED)                                           Fruit ices (not with fruit pulp, NO RED)                                     Popsicles (NO RED)                                                               Sports drinks like Gatorade (NO RED)                   The day of surgery:  Drink ONE (1) Pre-Surgery Clear Ensure or G2 at   0600AM the morning of surgery. Drink in one sitting. Do not sip.  This drink was given to you during your hospital  pre-op appointment visit. Nothing else to drink after completing the  Pre-Surgery Clear Ensure or  G2.          If you have questions, please contact your surgeons office.       Oral Hygiene is also important to reduce your risk of infection.                                    Remember - BRUSH YOUR TEETH THE MORNING OF SURGERY WITH YOUR REGULAR TOOTHPASTE  DENTURES WILL BE REMOVED PRIOR TO SURGERY PLEASE DO NOT APPLY Poly  grip OR ADHESIVES!!!   Do NOT smoke after Midnight   Stop all vitamins and herbal supplements 7 days before surgery.   Take these medicines the morning of surgery with A SIP OF WATER: allopurinol , metoprolol , potassium betapace     DO NOT TAKE ANY ORAL DIABETIC MEDICATIONS DAY OF YOUR SURGERY  Bring CPAP mask and tubing day of surgery.                              You may not have any metal on your body including hair pins, jewelry, and body piercing             Do not wear make-up, lotions, powders, perfumes/cologne, or deodorant  Do not wear nail polish including gel and S&S, artificial/acrylic nails, or any other type of covering on natural nails including finger and toenails. If you have artificial nails, gel coating, etc. that needs to be removed by a nail salon please have this removed prior to surgery or surgery may need to be canceled/ delayed if the surgeon/ anesthesia feels like they are unable to be safely monitored.   Do not shave  48 hours prior to surgery.               Men may shave face and neck.   Do not bring valuables to the hospital. [facility] IS NOT             RESPONSIBLE   FOR VALUABLES.   Contacts, glasses, dentures or bridgework may not be worn into surgery.   Bring small overnight bag day of surgery.   DO NOT BRING YOUR HOME MEDICATIONS TO THE HOSPITAL. PHARMACY WILL DISPENSE MEDICATIONS LISTED ON YOUR MEDICATION LIST TO YOU DURING YOUR ADMISSION IN THE HOSPITAL!    Patients discharged on the day of surgery will not be allowed to drive home.  Someone NEEDS to stay with you for the first 24 hours after anesthesia.   Special  Instructions: Bring a copy of your healthcare power of attorney and living will documents the day of surgery if you haven't scanned them before.              Please read over the following fact sheets you were given: IF YOU HAVE QUESTIONS ABOUT YOUR PRE-OP INSTRUCTIONS PLEASE CALL [phone].   If you received a COVID test during your pre-op visit  it is requested that you wear a mask when out in public, stay away from anyone that may not be feeling well and notify your surgeon if you develop symptoms. If you test positive for Covid or have been in contact with anyone that has tested positive in the last 10 days please notify you surgeon.      Pre-operative 4 CHG Bath Instructions   You can play a key role in reducing the risk of infection after surgery. Your skin needs to be as free of germs as possible. You can reduce the number of germs on your skin by washing with CHG (chlorhexidine gluconate) soap before surgery. CHG is an antiseptic soap that kills germs and continues to kill germs even after washing.   DO NOT use if you have an allergy to chlorhexidine/CHG or antibacterial soaps. If your skin becomes reddened or irritated, stop using the CHG and notify one of our RNs at [phone].   Please shower with the CHG soap starting 4 days before surgery using the following schedule:     Please  keep in mind the following:  DO NOT shave, including legs and underarms, starting the day of your first shower.   You may shave your face at any point before/day of surgery.  Place clean sheets on your bed the day you start using CHG soap. Use a clean washcloth (not used since being washed) for each shower. DO NOT sleep with pets once you start using the CHG.   CHG Shower Instructions:  If you choose to wash your hair and private area, wash first with your normal shampoo/soap.  After you use shampoo/soap, rinse your hair and body thoroughly to remove shampoo/soap residue.  Turn the water OFF and  apply about 3 tablespoons (45 ml) of CHG soap to a CLEAN washcloth.  Apply CHG soap ONLY FROM YOUR NECK DOWN TO YOUR TOES (washing for 3-5 minutes)  DO NOT use CHG soap on face, private areas, open wounds, or sores.  Pay special attention to the area where your surgery is being performed.  If you are having back surgery, having someone wash your back for you may be helpful. Wait 2 minutes after CHG soap is applied, then you may rinse off the CHG soap.  Pat dry with a clean towel  Put on clean clothes/pajamas   If you choose to wear lotion, please use ONLY the CHG-compatible lotions on the back of this paper.     Additional instructions for the day of surgery: DO NOT APPLY any lotions, deodorants, cologne, or perfumes.   Put on clean/comfortable clothes.  Brush your teeth.  Ask your nurse before applying any prescription medications to the skin.      CHG Compatible Lotions   Aveeno Moisturizing lotion  Cetaphil Moisturizing Cream  Cetaphil Moisturizing Lotion  Clairol Herbal Essence Moisturizing Lotion, Dry Skin  Clairol Herbal Essence Moisturizing Lotion, Extra Dry Skin  Clairol Herbal Essence Moisturizing Lotion, Normal Skin  Curel Age Defying Therapeutic Moisturizing Lotion with Alpha Hydroxy  Curel Extreme Care Body Lotion  Curel Soothing Hands Moisturizing Hand Lotion  Curel Therapeutic Moisturizing Cream, Fragrance-Free  Curel Therapeutic Moisturizing Lotion, Fragrance-Free  Curel Therapeutic Moisturizing Lotion, Original Formula  Eucerin Daily Replenishing Lotion  Eucerin Dry Skin Therapy Plus Alpha Hydroxy Crme  Eucerin Dry Skin Therapy Plus Alpha Hydroxy Lotion  Eucerin Original Crme  Eucerin Original Lotion  Eucerin Plus Crme Eucerin Plus Lotion  Eucerin TriLipid Replenishing Lotion  Keri Anti-Bacterial Hand Lotion  Keri Deep Conditioning Original Lotion Dry Skin Formula Softly Scented  Keri Deep Conditioning Original Lotion, Fragrance Free Sensitive Skin  Formula  Keri Lotion Fast Absorbing Fragrance Free Sensitive Skin Formula  Keri Lotion Fast Absorbing Softly Scented Dry Skin Formula  Keri Original Lotion  Keri Skin Renewal Lotion Keri Silky Smooth Lotion  Keri Silky Smooth Sensitive Skin Lotion  Nivea Body Creamy Conditioning Oil  Nivea Body Extra Enriched Teacher, Adult Education Moisturizing Lotion Nivea Crme  Nivea Skin Firming Lotion  NutraDerm 30 Skin Lotion  NutraDerm Skin Lotion  NutraDerm Therapeutic Skin Cream  NutraDerm Therapeutic Skin Lotion  ProShield Protective Hand Cream  Provon moisturizing lotion

## 2024-09-21 ENCOUNTER — Encounter (HOSPITAL_COMMUNITY)
Admission: RE | Admit: 2024-09-21 | Discharge: 2024-09-21 | Disposition: A | Discharge status: Home / Self Care | Source: Ambulatory Visit | Attending: Orthopedic Surgery | Admitting: Orthopedic Surgery

## 2024-09-21 ENCOUNTER — Encounter (HOSPITAL_COMMUNITY): Payer: Self-pay

## 2024-09-21 ENCOUNTER — Other Ambulatory Visit: Payer: Self-pay

## 2024-09-21 VITALS — BP 164/77 | HR 55 | Temp 98.5°F | Resp 16 | Ht 66.0 in | Wt 190.0 lb

## 2024-09-21 DIAGNOSIS — I739 Peripheral vascular disease, unspecified: Secondary | ICD-10-CM | POA: Diagnosis not present

## 2024-09-21 DIAGNOSIS — K219 Gastro-esophageal reflux disease without esophagitis: Secondary | ICD-10-CM | POA: Insufficient documentation

## 2024-09-21 DIAGNOSIS — Z01812 Encounter for preprocedural laboratory examination: Secondary | ICD-10-CM | POA: Insufficient documentation

## 2024-09-21 DIAGNOSIS — M1711 Unilateral primary osteoarthritis, right knee: Secondary | ICD-10-CM | POA: Diagnosis not present

## 2024-09-21 DIAGNOSIS — I4891 Unspecified atrial fibrillation: Secondary | ICD-10-CM | POA: Diagnosis not present

## 2024-09-21 DIAGNOSIS — Z85038 Personal history of other malignant neoplasm of large intestine: Secondary | ICD-10-CM | POA: Diagnosis not present

## 2024-09-21 DIAGNOSIS — I5032 Chronic diastolic (congestive) heart failure: Secondary | ICD-10-CM | POA: Insufficient documentation

## 2024-09-21 DIAGNOSIS — M810 Age-related osteoporosis without current pathological fracture: Secondary | ICD-10-CM | POA: Insufficient documentation

## 2024-09-21 DIAGNOSIS — Z01818 Encounter for other preprocedural examination: Secondary | ICD-10-CM

## 2024-09-21 DIAGNOSIS — I110 Hypertensive heart disease with heart failure: Secondary | ICD-10-CM | POA: Diagnosis not present

## 2024-09-21 DIAGNOSIS — Z7901 Long term (current) use of anticoagulants: Secondary | ICD-10-CM | POA: Diagnosis not present

## 2024-09-21 DIAGNOSIS — K449 Diaphragmatic hernia without obstruction or gangrene: Secondary | ICD-10-CM | POA: Diagnosis not present

## 2024-09-21 DIAGNOSIS — I2510 Atherosclerotic heart disease of native coronary artery without angina pectoris: Secondary | ICD-10-CM | POA: Diagnosis not present

## 2024-09-21 HISTORY — DX: Gastro-esophageal reflux disease without esophagitis: K21.9

## 2024-09-21 LAB — BASIC METABOLIC PANEL WITH GFR
Anion gap: 9 (ref 5–15)
BUN: 19 mg/dL (ref 8–23)
CO2: 28 mmol/L (ref 22–32)
Calcium: 10.1 mg/dL (ref 8.9–10.3)
Chloride: 104 mmol/L (ref 98–111)
Creatinine, Ser: 0.95 mg/dL (ref 0.44–1.00)
GFR, Estimated: 57 mL/min — ABNORMAL LOW
Glucose, Bld: 92 mg/dL (ref 70–99)
Potassium: 4.5 mmol/L (ref 3.5–5.1)
Sodium: 142 mmol/L (ref 135–145)

## 2024-09-21 LAB — CBC
HCT: 47 % — ABNORMAL HIGH (ref 36.0–46.0)
Hemoglobin: 15.2 g/dL — ABNORMAL HIGH (ref 12.0–15.0)
MCH: 30 pg (ref 26.0–34.0)
MCHC: 32.3 g/dL (ref 30.0–36.0)
MCV: 92.7 fL (ref 80.0–100.0)
Platelets: 184 K/uL (ref 150–400)
RBC: 5.07 MIL/uL (ref 3.87–5.11)
RDW: 14.3 % (ref 11.5–15.5)
WBC: 6.1 K/uL (ref 4.0–10.5)
nRBC: 0 % (ref 0.0–0.2)

## 2024-09-21 LAB — SURGICAL PCR SCREEN
MRSA, PCR: NEGATIVE
Staphylococcus aureus: NEGATIVE

## 2024-09-22 ENCOUNTER — Encounter (HOSPITAL_COMMUNITY): Payer: Self-pay

## 2024-09-22 NOTE — Anesthesia Preprocedure Evaluation (Signed)
 Anesthesia Evaluation  Patient identified by MRN, date of birth, ID band Patient awake    Reviewed: Allergy & Precautions, H&P , NPO status , Patient's Chart, lab work & pertinent test results  Airway Mallampati: II  TM Distance: >3 FB Neck ROM: Full    Dental  (+) Dental Advisory Given   Pulmonary sleep apnea    Pulmonary exam normal breath sounds clear to auscultation       Cardiovascular hypertension, Pt. on medications + CAD and + Cardiac Stents  Normal cardiovascular exam Rhythm:Regular Rate:Normal     Neuro/Psych  Headaches   Depression     negative psych ROS   GI/Hepatic Neg liver ROS,GERD  ,,  Endo/Other  negative endocrine ROS    Renal/GU negative Renal ROS  negative genitourinary   Musculoskeletal  (+) Arthritis , Osteoarthritis,  Fibromyalgia -  Abdominal  (+) + obese  Peds negative pediatric ROS (+)  Hematology negative hematology ROS (+)   Anesthesia Other Findings   Reproductive/Obstetrics negative OB ROS                              Anesthesia Physical Anesthesia Plan  ASA: 3  Anesthesia Plan: General   Post-op Pain Management: Regional block*   Induction: Intravenous  PONV Risk Score and Plan: 3 and Ondansetron , Dexamethasone , Midazolam  and Treatment may vary due to age or medical condition  Airway Management Planned: Oral ETT  Additional Equipment:   Intra-op Plan:   Post-operative Plan: Extubation in OR  Informed Consent: I have reviewed the patients History and Physical, chart, labs and discussed the procedure including the risks, benefits and alternatives for the proposed anesthesia with the patient or authorized representative who has indicated his/her understanding and acceptance.     Dental advisory given  Plan Discussed with: CRNA  Anesthesia Plan Comments: (See PAT note from 12/1)         Anesthesia Quick Evaluation

## 2024-09-22 NOTE — Progress Notes (Signed)
 " Case: [id number] Date/Time: 09/27/24 0845   Procedure: ARTHROPLASTY, KNEE, UNICOMPARTMENTAL (Right: Knee)   Anesthesia type: General   Diagnosis: Primary osteoarthritis of right knee [M17.11]   Pre-op diagnosis: osteoarthritis of right knee   Location: [facility] ROOM 08 / [facility]   Surgeons: Josefina Chew, MD       DISCUSSION: [name] is an 89 yo female with PMH of HTN, mild nonobstructive CAD (by cath in 2016), HFpEF, A-fib s/p multiple DCCV on Xarelto , PAD, esophageal stricture s/p dilation, GERD, small hiatal hernia, ?Crohn's disease, colon cancer s/p sigmoidectomy (2008), arthritis.  Patient follows with cardiology for above history.  Last seen on 08/12/2024 by Dr. Kate.  She is on Xarelto  and sotalol  for A-fib.  She reported some lightheadedness with standing and fatigue but otherwise no cardiac symptoms.  Last echocardiogram 10/2023 showed EF 60 to 65%, normal RV function, mild mitral egurgitation.  Her metoprolol  dose was decreased due to fatigue.  Otherwise advised to continue current medications and follow-up in 4 months.  Cleared from cardiac perspective and 08/08/2024 telephone note:  Chart reviewed as part of pre-operative protocol coverage. Given past medical history and time since last visit, based on ACC/AHA guidelines, [name] would be at acceptable risk for the planned procedure without further cardiovascular testing Per office protocol, she may hold Xarelto  for 3 days prior to procedure and should resume as soon as hemodynamically stable postoperatively.  Hx of PAD not followed by Vascular. She denied claudication symptoms at last OV with Cardiology in 10/2023. She is on medical therapy.  Followed by GI for dysphagia. Last seen on 04/27/24 and reportedly doing better and patient declined any further w/u. Also with hx of Crohn's disease per GI notes in 2011 however she is asymptomatic and it was advised she could remain off treatment.  LD Xarelto : 1/23  VS: BP (!)  164/77   Pulse (!) 55   Temp 36.9 C (Oral)   Resp 16   Ht 5' 6 (1.676 m)   Wt 86.2 kg   SpO2 97%   BMI 30.67 kg/m   PROVIDERS: Joshua Debby CROME, MD   LABS: Labs reviewed: Acceptable for surgery. (all labs ordered are listed, but only abnormal results are displayed)  Labs Reviewed  CBC - Abnormal; Notable for the following components:      Result Value   Hemoglobin 15.2 (*)    HCT 47.0 (*)    All other components within normal limits  BASIC METABOLIC PANEL WITH GFR - Abnormal; Notable for the following components:   GFR, Estimated 57 (*)    All other components within normal limits  SURGICAL PCR SCREEN    Echo 11/27/2023:  IMPRESSIONS    1. Left ventricular ejection fraction, by estimation, is 60 to 65%. The left ventricle has normal function. The left ventricle has no regional wall motion abnormalities. Left ventricular diastolic parameters are consistent with Grade I diastolic dysfunction (impaired relaxation). Elevated left atrial pressure. The average left ventricular global longitudinal strain is -16.7 %. The global longitudinal strain is abnormal.  2. Right ventricular systolic function is normal. The right ventricular size is normal. Tricuspid regurgitation signal is inadequate for assessing PA pressure.  3. The mitral valve is normal in structure. Mild mitral valve regurgitation. No evidence of mitral stenosis.  4. The aortic valve is tricuspid. Aortic valve regurgitation is trivial. Aortic valve sclerosis/calcification is present, without any evidence of aortic stenosis.  5. The inferior vena cava is normal in size with greater  than 50% respiratory variability, suggesting right atrial pressure of 3 mmHg.  Left heart cath 03/13/2015:  Mid Cx lesion, 10% stenosed. The left ventricular systolic function is normal.   Normal LV function with an estimated ejection fraction of 55-60%.   No significant coronary obstructive disease with the midportion of the  mid LAD dipping intramyocardially without evidence for systolic bridging, smooth 10% narrowing in the AV groove circumflex  coronary artery, and normal dominant RCA.   Recommendation:   Continued medical therapy.  The patient is scheduled for PV angiography on 04/02/2015 after Doppler study suggested occlusive disease in the left SFA.  Past Medical History:  Diagnosis Date   Abnormal nuclear stress test    mild anterolateral septal and inferior ischemia   Arthritis    Atherosclerosis of lower extremity with claudication 04/17/2015   LEFT LEG   Atrial fibrillation (HCC)    Cardiac arrhythmia due to congenital heart disease    Claudication    Colon cancer [facility])    Coronary atherosclerosis of unspecified type of vessel, native or graft    Esophageal stricture    Fatty liver 11/05/2011   GERD (gastroesophageal reflux disease)    Hiatal hernia    HLD (hyperlipidemia)    Hypertension    IBS (irritable bowel syndrome)    Iron deficiency anemia, unspecified    Ischemic colitis    Neuropathy    Osteoporosis    PAF (paroxysmal atrial fibrillation) (HCC) 04/2015   Ulcerative colitis (HCC)    Unspecified vascular insufficiency of intestine     Past Surgical History:  Procedure Laterality Date   abdominoperineal resection anastomotic stricture  05/03/2007   APPENDECTOMY     CARDIAC CATHETERIZATION N/A 03/13/2015   Procedure: Left Heart Cath and Coronary Angiography;  Surgeon: Debby DELENA Sor, MD;  Location: [facility];  Service: Cardiovascular;  Laterality: N/A;   CARDIOVERSION N/A 04/20/2015   Procedure: CARDIOVERSION;  Surgeon: Oneil JAYSON Parchment, MD;  Location: [facility] ENDOSCOPY;  Service: Cardiovascular;  Laterality: N/A;   CARDIOVERSION N/A 07/10/2017   Procedure: CARDIOVERSION;  Surgeon: Parchment Oneil JAYSON, MD;  Location: [facility] ENDOSCOPY;  Service: Cardiovascular;  Laterality: N/A;   COLON SURGERY     DILATION AND CURETTAGE OF UTERUS  09/01/1990   ESOPHAGOGASTRODUODENOSCOPY N/A  04/27/2014   Procedure: ESOPHAGOGASTRODUODENOSCOPY (EGD);  Surgeon: Princella CHRISTELLA Nida, MD;  Location: [facility];  Service: Endoscopy;  Laterality: N/A;   Mandibular Renstruction     ROTATOR CUFF REPAIR  09/01/2004   left   Rt. Salpingo oophorectomy and cyst removal  09/02/2003   SAVORY DILATION N/A 04/27/2014   Procedure: SAVORY DILATION;  Surgeon: Princella CHRISTELLA Nida, MD;  Location: [facility];  Service: Endoscopy;  Laterality: N/A;  no xray needed   sigmoid resection for invasive rectal adenocarcinoma  12/31/2006   TEE WITHOUT CARDIOVERSION N/A 04/20/2015   Procedure: TRANSESOPHAGEAL ECHOCARDIOGRAM (TEE);  Surgeon: Oneil JAYSON Parchment, MD;  Location: [facility] ENDOSCOPY;  Service: Cardiovascular;  Laterality: N/A;   TUBAL LIGATION     WRIST SURGERY  09/01/2006   right    MEDICATIONS:  acetaminophen  (TYLENOL ) 500 MG tablet   allopurinol  (ZYLOPRIM ) 100 MG tablet   Calcium  Carbonate-Vitamin D  (CALCIUM -D PO)   colchicine  0.6 MG tablet   cyanocobalamin  (VITAMIN B12) 1000 MCG tablet   cyanocobalamin  2000 MCG tablet   diclofenac  sodium (VOLTAREN ) 1 % GEL   Dietary Management Product (FOSTEUM PLUS) CAPS   ezetimibe  (ZETIA ) 10 MG tablet   famotidine  (PEPCID ) 20 MG tablet   finasteride  (  PROPECIA ) 1 MG tablet   gabapentin  (NEURONTIN ) 100 MG capsule   metoprolol  tartrate (LOPRESSOR ) 25 MG tablet   Peppermint Oil (IBGARD) 90 MG CPCR   polyethylene glycol powder (MIRALAX ) 17 GM/SCOOP powder   potassium chloride  (KLOR-CON  M) 10 MEQ tablet   sotalol  (BETAPACE ) 120 MG tablet   torsemide  (DEMADEX ) 10 MG tablet   traMADol  (ULTRAM ) 50 MG tablet   Wheat Dextrin (BENEFIBER) POWD   XARELTO  15 MG TABS tablet   No current facility-administered medications for this encounter.   Burnard CHRISTELLA Odis DEVONNA [facility] Short Stay/Anesthesiology [facility] Phone [phone] 09/22/2024 9:31 AM       "

## 2024-10-13 ENCOUNTER — Encounter (HOSPITAL_COMMUNITY): Admission: RE | Admit: 2024-10-13

## 2024-10-24 ENCOUNTER — Ambulatory Visit: Admitting: Cardiology

## 2024-10-25 ENCOUNTER — Ambulatory Visit (HOSPITAL_COMMUNITY): Admission: RE | Admit: 2024-10-25 | Source: Home / Self Care | Admitting: Orthopedic Surgery

## 2024-10-25 ENCOUNTER — Encounter (HOSPITAL_COMMUNITY): Payer: Self-pay | Admitting: Medical

## 2024-10-25 ENCOUNTER — Encounter (HOSPITAL_COMMUNITY): Admission: RE | Payer: Self-pay | Source: Home / Self Care

## 2024-11-02 ENCOUNTER — Ambulatory Visit: Admitting: Internal Medicine
